# Patient Record
Sex: Male | Born: 1935 | Race: White | Hispanic: No | Marital: Married | Smoking: Former smoker
Health system: Southern US, Community
[De-identification: ages and names within clinical notes are randomized; demographics above are authoritative.]

## PROBLEM LIST (undated history)

## (undated) DIAGNOSIS — J9610 Chronic respiratory failure, unspecified whether with hypoxia or hypercapnia: Secondary | ICD-10-CM

## (undated) DIAGNOSIS — I447 Left bundle-branch block, unspecified: Secondary | ICD-10-CM

## (undated) DIAGNOSIS — J309 Allergic rhinitis, unspecified: Secondary | ICD-10-CM

## (undated) DIAGNOSIS — Z95828 Presence of other vascular implants and grafts: Secondary | ICD-10-CM

## (undated) DIAGNOSIS — J449 Chronic obstructive pulmonary disease, unspecified: Secondary | ICD-10-CM

## (undated) DIAGNOSIS — I5022 Chronic systolic (congestive) heart failure: Secondary | ICD-10-CM

## (undated) DIAGNOSIS — B029 Zoster without complications: Secondary | ICD-10-CM

## (undated) DIAGNOSIS — F419 Anxiety disorder, unspecified: Secondary | ICD-10-CM

## (undated) DIAGNOSIS — Z8719 Personal history of other diseases of the digestive system: Secondary | ICD-10-CM

## (undated) DIAGNOSIS — Z9981 Dependence on supplemental oxygen: Secondary | ICD-10-CM

## (undated) DIAGNOSIS — K819 Cholecystitis, unspecified: Secondary | ICD-10-CM

## (undated) DIAGNOSIS — N529 Male erectile dysfunction, unspecified: Secondary | ICD-10-CM

## (undated) DIAGNOSIS — J849 Interstitial pulmonary disease, unspecified: Secondary | ICD-10-CM

## (undated) DIAGNOSIS — I639 Cerebral infarction, unspecified: Secondary | ICD-10-CM

## (undated) DIAGNOSIS — E785 Hyperlipidemia, unspecified: Secondary | ICD-10-CM

## (undated) DIAGNOSIS — I739 Peripheral vascular disease, unspecified: Secondary | ICD-10-CM

## (undated) DIAGNOSIS — E669 Obesity, unspecified: Secondary | ICD-10-CM

## (undated) DIAGNOSIS — I429 Cardiomyopathy, unspecified: Secondary | ICD-10-CM

## (undated) DIAGNOSIS — I10 Essential (primary) hypertension: Secondary | ICD-10-CM

## (undated) DIAGNOSIS — C44310 Basal cell carcinoma of skin of unspecified parts of face: Secondary | ICD-10-CM

## (undated) HISTORY — DX: Hyperlipidemia, unspecified: E78.5

## (undated) HISTORY — DX: Interstitial pulmonary disease, unspecified: J84.9

## (undated) HISTORY — DX: Peripheral vascular disease, unspecified: I73.9

## (undated) HISTORY — DX: Cholecystitis, unspecified: K81.9

## (undated) HISTORY — DX: Chronic respiratory failure, unspecified whether with hypoxia or hypercapnia: J96.10

## (undated) HISTORY — DX: Allergic rhinitis, unspecified: J30.9

## (undated) HISTORY — DX: Chronic systolic (congestive) heart failure: I50.22

## (undated) HISTORY — DX: Obesity, unspecified: E66.9

## (undated) HISTORY — PX: URETHRAL STRICTURE DILATATION: SHX477

## (undated) HISTORY — DX: Male erectile dysfunction, unspecified: N52.9

## (undated) HISTORY — DX: Cerebral infarction, unspecified: I63.9

## (undated) HISTORY — DX: Essential (primary) hypertension: I10

## (undated) HISTORY — DX: Presence of other vascular implants and grafts: Z95.828

## (undated) HISTORY — DX: Left bundle-branch block, unspecified: I44.7

## (undated) HISTORY — DX: Chronic obstructive pulmonary disease, unspecified: J44.9

## (undated) HISTORY — DX: Cardiomyopathy, unspecified: I42.9

## (undated) HISTORY — DX: Basal cell carcinoma of skin of unspecified parts of face: C44.310

---

## 1977-08-22 HISTORY — PX: HERNIA REPAIR: SHX51

## 2000-07-22 ENCOUNTER — Emergency Department (HOSPITAL_COMMUNITY): Admission: EM | Admit: 2000-07-22 | Discharge: 2000-07-23 | Payer: Self-pay | Admitting: Emergency Medicine

## 2000-07-23 ENCOUNTER — Encounter: Payer: Self-pay | Admitting: Emergency Medicine

## 2000-08-22 DIAGNOSIS — Z95828 Presence of other vascular implants and grafts: Secondary | ICD-10-CM

## 2000-08-22 HISTORY — DX: Presence of other vascular implants and grafts: Z95.828

## 2000-08-22 HISTORY — PX: AORTA SURGERY: SHX548

## 2000-10-17 ENCOUNTER — Ambulatory Visit (HOSPITAL_BASED_OUTPATIENT_CLINIC_OR_DEPARTMENT_OTHER): Admission: RE | Admit: 2000-10-17 | Discharge: 2000-10-17 | Payer: Self-pay | Admitting: General Surgery

## 2001-01-15 ENCOUNTER — Encounter: Payer: Self-pay | Admitting: Vascular Surgery

## 2001-01-17 ENCOUNTER — Ambulatory Visit (HOSPITAL_COMMUNITY): Admission: RE | Admit: 2001-01-17 | Discharge: 2001-01-17 | Payer: Self-pay | Admitting: Vascular Surgery

## 2001-01-24 ENCOUNTER — Inpatient Hospital Stay (HOSPITAL_COMMUNITY): Admission: RE | Admit: 2001-01-24 | Discharge: 2001-01-29 | Payer: Self-pay | Admitting: Vascular Surgery

## 2001-01-24 ENCOUNTER — Encounter: Payer: Self-pay | Admitting: Vascular Surgery

## 2001-01-25 ENCOUNTER — Encounter: Payer: Self-pay | Admitting: Vascular Surgery

## 2002-11-26 ENCOUNTER — Ambulatory Visit (HOSPITAL_COMMUNITY): Admission: RE | Admit: 2002-11-26 | Discharge: 2002-11-26 | Payer: Self-pay | Admitting: Gastroenterology

## 2003-05-19 ENCOUNTER — Encounter: Payer: Self-pay | Admitting: Internal Medicine

## 2003-05-19 ENCOUNTER — Encounter: Admission: RE | Admit: 2003-05-19 | Discharge: 2003-05-19 | Payer: Self-pay | Admitting: Internal Medicine

## 2005-03-07 ENCOUNTER — Ambulatory Visit (HOSPITAL_COMMUNITY): Admission: RE | Admit: 2005-03-07 | Discharge: 2005-03-07 | Payer: Self-pay | Admitting: Gastroenterology

## 2012-01-20 ENCOUNTER — Other Ambulatory Visit: Payer: Self-pay | Admitting: Dermatology

## 2012-07-12 ENCOUNTER — Telehealth: Payer: Self-pay | Admitting: Cardiology

## 2012-07-12 NOTE — Telephone Encounter (Signed)
New Problem:    Malachi Bonds from [facility] called in because Dr. Waynard Edwards wants the patient to come in and be seen by Dr. [name] for Dyspnea on exertion multiple cardiac risk factors.  Patient claims to have been seen by Dr. [name] 8 years ago.  Please call back, patient does not desire a Tuesday or Wednesday.

## 2012-07-13 NOTE — Telephone Encounter (Signed)
Left message to call back  

## 2012-07-16 NOTE — Telephone Encounter (Signed)
New Problem: ° ° ° °Patient returned your call.  Please call back. °

## 2012-07-16 NOTE — Telephone Encounter (Signed)
F/u   Patient returning nurse call, he can be reached at [phone] or [phone]

## 2012-07-16 NOTE — Telephone Encounter (Signed)
Scheduled appointment for patient. Denies any cardiac history or chest pain. Only symptoms are dyspnea for about 4-5 months

## 2012-08-03 ENCOUNTER — Ambulatory Visit (INDEPENDENT_AMBULATORY_CARE_PROVIDER_SITE_OTHER): Payer: Medicare Other | Admitting: Cardiology

## 2012-08-03 VITALS — BP 149/77 | HR 80 | Resp 18 | Wt 196.0 lb

## 2012-08-03 DIAGNOSIS — Z9889 Other specified postprocedural states: Secondary | ICD-10-CM | POA: Insufficient documentation

## 2012-08-03 DIAGNOSIS — R0609 Other forms of dyspnea: Secondary | ICD-10-CM

## 2012-08-03 DIAGNOSIS — I119 Hypertensive heart disease without heart failure: Secondary | ICD-10-CM

## 2012-08-03 DIAGNOSIS — E7800 Pure hypercholesterolemia, unspecified: Secondary | ICD-10-CM

## 2012-08-03 DIAGNOSIS — K439 Ventral hernia without obstruction or gangrene: Secondary | ICD-10-CM
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
## 2012-08-03 NOTE — Patient Instructions (Signed)
Your physician has requested that you have an echocardiogram. Echocardiography is a painless test that uses sound waves to create images of your heart. It provides your doctor with information about the size and shape of your heart and how well your heart's chambers and valves are working. This procedure takes approximately one hour. There are no restrictions for this procedure.  Your physician has requested that you have an adenosine myoview. For further information please visit [email or web address] Please follow instruction sheet, as given.  WILL HAVE YOU GO FOR A CHEST XRAY TO [place] [facility] FROM [facility]

## 2012-08-06 ENCOUNTER — Ambulatory Visit (INDEPENDENT_AMBULATORY_CARE_PROVIDER_SITE_OTHER)
Admission: RE | Admit: 2012-08-06 | Discharge: 2012-08-06 | Disposition: A | Discharge status: Home / Self Care | Payer: Medicare Other | Source: Ambulatory Visit | Attending: Cardiology | Admitting: Cardiology

## 2012-08-06 DIAGNOSIS — R0609 Other forms of dyspnea: Secondary | ICD-10-CM

## 2012-08-07 ENCOUNTER — Ambulatory Visit (HOSPITAL_COMMUNITY): Payer: Medicare Other | Attending: Cardiovascular Disease | Admitting: Radiology

## 2012-08-07 VITALS — BP 122/69 | HR 82 | Ht 66.0 in | Wt 194.0 lb

## 2012-08-07 DIAGNOSIS — I10 Essential (primary) hypertension: Secondary | ICD-10-CM | POA: Insufficient documentation

## 2012-08-07 DIAGNOSIS — R0989 Other specified symptoms and signs involving the circulatory and respiratory systems: Secondary | ICD-10-CM | POA: Insufficient documentation

## 2012-08-07 DIAGNOSIS — I447 Left bundle-branch block, unspecified: Secondary | ICD-10-CM

## 2012-08-07 DIAGNOSIS — R9431 Abnormal electrocardiogram [ECG] [EKG]: Secondary | ICD-10-CM

## 2012-08-07 DIAGNOSIS — E7800 Pure hypercholesterolemia, unspecified: Secondary | ICD-10-CM

## 2012-08-07 DIAGNOSIS — I119 Hypertensive heart disease without heart failure: Secondary | ICD-10-CM

## 2012-08-07 DIAGNOSIS — R0609 Other forms of dyspnea: Secondary | ICD-10-CM | POA: Insufficient documentation

## 2012-08-07 MED ORDER — ADENOSINE (DIAGNOSTIC) 3 MG/ML IV SOLN
0.5600 mg/kg | Freq: Once | INTRAVENOUS | Status: AC
  Administered 2012-08-07: 49.2 mg via INTRAVENOUS

## 2012-08-07 MED ORDER — TECHNETIUM TC 99M SESTAMIBI GENERIC - CARDIOLITE
11.0000 | Freq: Once | INTRAVENOUS | Status: AC | PRN
  Administered 2012-08-07: 11 via INTRAVENOUS

## 2012-08-07 MED ORDER — TECHNETIUM TC 99M SESTAMIBI GENERIC - CARDIOLITE
33.0000 | Freq: Once | INTRAVENOUS | Status: AC | PRN
  Administered 2012-08-07: 33 via INTRAVENOUS

## 2012-08-07 NOTE — Progress Notes (Deleted)
[facility] SITE 3 NUCLEAR MED [address] [place], [place] [address] [phone]    Cardiology Nuclear Med Study  Lee [name] is a 78 y.o. male     MRN : [id number]     DOB: 05-06-1931  Procedure Date: 08/07/2012  Nuclear Med Background Indication for Stress Test:  {CHL INDICATION STRESS TEST:21021011} History:  {CHL HISTORY STRESS TEST:22964} Cardiac Risk Factors: {CHL CARDIAC RISK FACTORS STRESS TEST:21021014}  Symptoms:  {CHL SYMPTOMS STRESS TEST:21021013}   Nuclear Pre-Procedure Caffeine/Decaff Intake:  {CHL CAFFEINE/DECAFF INTAKE NUC:21021108} NPO After: {CHL AMB NPO TIMES:21021015}   Lungs:  {Exam; lungs brief:12271} O2 Sat: ***% on {Exam; oxygen delivery:30093}. IV 0.9% NS with Angio Cath:  {CHL IV 0.9% WITH ANGIO CATH NUC:21021042}  IV Site: {CHL IV SITE STRESS TEST:21021077}  IV Started by:  {CHL LB STRESS TEST IV STARTED HQ:46962952}  Chest Size (in):  *** Cup Size: {NA AND WUXLKGMW:10272}  Height: 5\' 6"  (1.676 m)  Weight:  194 lb (87.998 kg)  BMI:  Body mass index is 31.31 kg/(m^2). Tech Comments:  ***    Nuclear Med Study 1 or 2 day study: {CHL 1 OR 2 DAY STUDY:21021019}  Stress Test Type:  {CHL STRESS TEST TYPE:21021018}  Reading MD: {CHL LB NUC READING ZD:66440347}  Order Authorizing Provider:  ***  Resting Radionuclide: {CHL RESTING RADIONUCLIDE:21021021}  Resting Radionuclide Dose: *** mCi   Stress Radionuclide:  {CHL STRESS RADIONUCLIDE:21021022}  Stress Radionuclide Dose: *** mCi           Stress Protocol Rest HR: *** Stress HR: ***  Rest BP: *** Stress BP: ***  Exercise Time (min): {NA AND WILDCARD:21589} METS: {NA AND WILDCARD:21589}           Dose of Adenosine (mg):  {NA AND QQVZDGLO:75643} Dose of Lexiscan: {CHL CARD WILDCARD AND 0.4:21590} mg  Dose of Atropine (mg): {NA AND PIRJJOAC:16606} Dose of Dobutamine: {NA AND WILDCARD:21589} mcg/kg/min (at max HR)  Stress Test Technologist: {CHL LB STRESS TEST TECHNOLOGIST:21021024}   Nuclear Technologist:  {CHL LB NUCLEAR TECHNOLOGIST:21021025}     Rest Procedure:  {CHL REST PROCEDURE NUCLEAR:21021027} Rest ECG: {CHL REST TKZ:60109}  Stress Procedure:  {CHL STRESS PROCEDURE NUCLEAR:21021028} Stress ECG: {CHL CAR STRESS ECG:21561}  QPS Raw Data Images:  {CHL RAW DATA IMAGES NUC:21021029} Stress Images:  {CHL STRESS IMAGES NUC:21021030} Rest Images:  {CHL REST IMAGES NUC:21021031} Subtraction (SDS):  {CHL SUBTRACTION (SDS) NUC:21021032} Transient Ischemic Dilatation (Normal <1.22):  *** Lung/Heart Ratio (Normal <0.45):  ***  Quantitative Gated Spect Images QGS EDV:  *** ml QGS ESV:  *** ml  Impression Exercise Capacity:  {CHL EXERCISE CAPACITY NUC:21021037} BP Response:  {CHL BP RESPONSE NUC:21021038} Clinical Symptoms:  {CHL CLINICAL SYMPTOMS NUC:21021039} ECG Impression:  {CHL ECG IMPRESSION NUC:21021040} Comparison with Prior Nuclear Study: {CHL NUCLEAR STUDY COMPARISON:21562}  Overall Impression:  {CHL OVERALL IMPRESSION NUC:21021041}  LV Ejection Fraction: {CHL CARD STUDY NOT GATED:21592:o}.  LV Wall Motion:  {CHL CARD QGS:21591:o}

## 2012-08-07 NOTE — Progress Notes (Signed)
[facility] SITE 3 NUCLEAR MED [address] [place], [place] [address] [phone]    Cardiology Nuclear Med Study  Lee [name] is a 78 y.o. male     MRN : [id number]     DOB: [birth year]  Procedure Date: 08/07/2012  Nuclear Med Background Indication for Stress Test:  Evaluation for Ischemia and Abnormal EKG:New LBBB History:  '01 MPS:OK per patient; '02 AAA Repair Cardiac Risk Factors: History of Smoking, Hypertension, LBBB, Lipids and Obesity  Symptoms:  DOE and Fatigue   Nuclear Pre-Procedure Caffeine/Decaff Intake:  None NPO After: 7:00pm   Lungs:  Clear. O2 Sat: 94% on room air. IV 0.9% NS with Angio Cath:  22g  IV Site: R Hand  IV Started by:  Milana Na, EMT-P  Chest Size (in):  42 Cup Size: n/a  Height: 5\' 6"  (1.676 m)  Weight:  194 lb (87.998 kg)  BMI:  Body mass index is 31.31 kg/(m^2). Tech Comments:  n/a    Nuclear Med Study 1 or 2 day study: 1 day  Stress Test Type:  Adenosine  Reading MD: Kristeen Miss, MD  Order Authorizing Provider:  Villa Herb  Resting Radionuclide: Technetium 55m Sestamibi  Resting Radionuclide Dose: 11.0 mCi   Stress Radionuclide:  Technetium 67m Sestamibi  Stress Radionuclide Dose: 33.0 mCi           Stress Protocol Rest HR: 82 Stress HR: 90  Rest BP: 122/69 Stress BP: 126/57  Exercise Time (min): n/a METS: n/a   Predicted Max HR: 141 bpm % Max HR: 63.83 bpm Rate Pressure Product: 21308    Dose of Adenosine (mg):  49.4 Dose of Lexiscan: n/a mg  Dose of Atropine (mg): n/a Dose of Dobutamine: n/a mcg/kg/min (at max HR)  Stress Test Technologist: Smiley Houseman, CMA-N  Nuclear Technologist:  Domenic Polite, CNMT     Rest Procedure:  Myocardial perfusion imaging was performed at rest 45 minutes following the intravenous administration of Technetium 60m Sestamibi.  Rest ECG: NSR-LBBB  Stress Procedure:  The patient received IV adenosine at 140 mcg/kg/min for 4 minutes.  Technetium 64m Sestamibi was  injected at the 2 minute mark and quantitative spect images were obtained after a 45 minute delay.  He c/o chest tightness with infusion.  Stress ECG: No significant change from baseline ECG  QPS Raw Data Images:  Mild diaphragmatic attenuation.  Normal left ventricular size. Stress Images:  Thre is mild-moderate attenuation of the entire inferior wall with normal uptake in the other regions.   Rest Images:  Thre is mild-moderate attenuation of the entire inferior wall with normal uptake in the other regions. Subtraction (SDS):  No evidence of ischemia.  SDS = 1. Transient Ischemic Dilatation (Normal <1.22):  1.10 Lung/Heart Ratio (Normal <0.45):  0.33  Quantitative Gated Spect Images QGS EDV:  92 ml QGS ESV:  44 ml  Impression Exercise Capacity:  Adenosine study with no exercise. BP Response:  Normal blood pressure response. Clinical Symptoms:  No significant symptoms noted. ECG Impression:  No significant ST segment change suggestive of ischemia. Comparison with Prior Nuclear Study: No images to compare  Overall Impression:  Low risk stress nuclear study.  SDS = 1.   There is attenuation of the inferior wall.  This region contracts well which suggests that this attenuation is due to diaphragmatic attenuation.  I cannot rule out inferior ischemia.   LV Ejection Fraction: 52%.  LV Wall Motion:  NL LV Function; NL Wall Motion.  Vesta Mixer, Montez Hageman., MD, [facility] 08/07/2012, 4:55 PM Office - [phone] Pager [phone]

## 2012-08-08 ENCOUNTER — Telehealth: Payer: Self-pay | Admitting: *Deleted

## 2012-08-08 ENCOUNTER — Other Ambulatory Visit (HOSPITAL_COMMUNITY): Payer: Medicare Other

## 2012-08-08 NOTE — Telephone Encounter (Signed)
Advised of chest xray and nuclear test

## 2012-08-08 NOTE — Telephone Encounter (Signed)
Message copied by Burnell Blanks on Wed Aug 08, 2012  5:46 PM ------      Message from: Cassell Clement      Created: Mon Aug 06, 2012  8:18 PM       Chest xray okay.  Heart size normal.

## 2012-08-08 NOTE — Telephone Encounter (Signed)
Message copied by Burnell Blanks on Wed Aug 08, 2012  5:46 PM ------      Message from: Cassell Clement      Created: Wed Aug 08, 2012  1:20 PM       Please report.  The nuclear stress test is normal and shows normal ejection fraction and no ischemia.

## 2012-08-09 ENCOUNTER — Ambulatory Visit (HOSPITAL_COMMUNITY): Payer: Medicare Other | Attending: Cardiology

## 2012-08-09 DIAGNOSIS — R0602 Shortness of breath: Secondary | ICD-10-CM

## 2012-08-09 DIAGNOSIS — E785 Hyperlipidemia, unspecified: Secondary | ICD-10-CM | POA: Insufficient documentation

## 2012-08-09 DIAGNOSIS — I447 Left bundle-branch block, unspecified: Secondary | ICD-10-CM | POA: Insufficient documentation

## 2012-08-09 DIAGNOSIS — R0609 Other forms of dyspnea: Secondary | ICD-10-CM | POA: Insufficient documentation

## 2012-08-09 DIAGNOSIS — R0989 Other specified symptoms and signs involving the circulatory and respiratory systems: Secondary | ICD-10-CM | POA: Insufficient documentation

## 2012-08-09 DIAGNOSIS — Z87891 Personal history of nicotine dependence: Secondary | ICD-10-CM | POA: Insufficient documentation

## 2012-08-09 NOTE — Progress Notes (Signed)
Echocardiogram performed.  

## 2012-08-13 ENCOUNTER — Telehealth: Payer: Self-pay | Admitting: *Deleted

## 2012-08-13 NOTE — Telephone Encounter (Signed)
Advised of echo Will send to Dr Waynard Edwards  Advised patient if doesn't hear from Perini's office to call back (possible referral to pulmonary)

## 2012-08-13 NOTE — Telephone Encounter (Signed)
Message copied by Burnell Blanks on Mon Aug 13, 2012  6:10 PM ------      Message from: Cassell Clement      Created: Thu Aug 09, 2012 12:41 PM       Please report.  The echocardiogram is satisfactory.  The left ventricular function is normal.  There are no significant valve abnormalities.  The pulmonary artery pressure is slightly elevated.  This suggests that his dyspnea may be from a pulmonary etiology.      Send a copy to Dr. Waynard Edwards

## 2012-09-12 ENCOUNTER — Encounter: Payer: Self-pay | Admitting: *Deleted

## 2012-09-13 ENCOUNTER — Encounter: Payer: Self-pay | Admitting: Internal Medicine

## 2012-09-13 ENCOUNTER — Ambulatory Visit (INDEPENDENT_AMBULATORY_CARE_PROVIDER_SITE_OTHER): Payer: Medicare Other | Admitting: Internal Medicine

## 2012-09-13 VITALS — BP 112/70 | HR 100 | Temp 97.6°F | Ht 66.0 in | Wt 197.8 lb

## 2012-09-13 DIAGNOSIS — R0600 Dyspnea, unspecified: Secondary | ICD-10-CM

## 2012-09-13 DIAGNOSIS — R0989 Other specified symptoms and signs involving the circulatory and respiratory systems: Secondary | ICD-10-CM

## 2012-09-13 DIAGNOSIS — J8410 Pulmonary fibrosis, unspecified: Secondary | ICD-10-CM

## 2012-09-13 DIAGNOSIS — J849 Interstitial pulmonary disease, unspecified: Secondary | ICD-10-CM

## 2012-09-13 NOTE — Patient Instructions (Addendum)
Please have walk test by our CMA, full pft breathing test and CT chest without contrast I will call you with results to decide next steps

## 2012-09-13 NOTE — Assessment & Plan Note (Signed)
Clinical features on chest x-ray and exam with crackles in history of dyspnea makes it suspicious for interstitial lung disease not otherwise specified. In order to better get definition of the disease will get pulmonary function tests and CT scan of the chest interstitial lung disease protocol. If there indeed is interstitial lung disease we'll proceed with autoimmune workup before I call him in for discussion. Alternative etiologies for dyspnea include deconditioning, obesity and COPD. I've explained all this to him and his wife and they have verbalized understanding and wished to proceed. I will call him with results and keep them engaged over the phone before we decide on followup date
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
## 2012-09-17 ENCOUNTER — Ambulatory Visit (INDEPENDENT_AMBULATORY_CARE_PROVIDER_SITE_OTHER)
Admission: RE | Admit: 2012-09-17 | Discharge: 2012-09-17 | Disposition: A | Discharge status: Home / Self Care | Payer: Medicare Other | Source: Ambulatory Visit | Attending: Internal Medicine | Admitting: Internal Medicine

## 2012-09-17 DIAGNOSIS — R0609 Other forms of dyspnea: Secondary | ICD-10-CM

## 2012-09-17 DIAGNOSIS — R0989 Other specified symptoms and signs involving the circulatory and respiratory systems: Secondary | ICD-10-CM

## 2012-09-17 DIAGNOSIS — J849 Interstitial pulmonary disease, unspecified: Secondary | ICD-10-CM

## 2012-09-17 DIAGNOSIS — J8410 Pulmonary fibrosis, unspecified: Secondary | ICD-10-CM

## 2012-09-17 DIAGNOSIS — R0600 Dyspnea, unspecified: Secondary | ICD-10-CM

## 2012-09-19 ENCOUNTER — Telehealth: Payer: Self-pay | Admitting: Internal Medicine

## 2012-09-19 DIAGNOSIS — J439 Emphysema, unspecified: Secondary | ICD-10-CM

## 2012-09-19 DIAGNOSIS — J849 Interstitial pulmonary disease, unspecified: Secondary | ICD-10-CM

## 2012-09-19 NOTE — Telephone Encounter (Signed)
CT scan chest reviewed below shows both emphysema and pulmonary fibrosis. Please tell him to have blood test and then based on results we will call him for followup  Dr. Kalman Shan, M.D., [facility].C.P Pulmonary and Critical Care Medicine Staff Physician [place] [facility] [place] [facility] Pager: [phone], If no answer or between  15:00h - 7:00h: call [phone]  09/19/2012 12:54 PM               Ct Chest Wo Contrast  09/17/2012  *RADIOLOGY REPORT*  Clinical Data: Dyspnea.  Crackles.  Evaluate for interstitial lung disease.  CT CHEST WITHOUT CONTRAST  Technique:  Multidetector CT imaging of the chest was performed following the standard protocol without IV contrast.  Comparison: No priors.  Findings:  Mediastinum: Heart size is normal. There is no significant pericardial fluid, thickening or pericardial calcification. There is atherosclerosis of the thoracic aorta, the great vessels of the mediastinum and the coronary arteries, including calcified atherosclerotic plaque in the left main and right coronary arteries. No pathologically enlarged mediastinal or hilar lymph nodes. Please note that accurate exclusion of hilar adenopathy is limited on noncontrast CT scans.  There is a small hiatal hernia.  Lungs/Pleura: There is mild diffuse bronchial wall thickening with moderate centrilobular and paraseptal emphysema, most pronounced in the lung apices bilaterally (right greater than left) where there are multiple bulla.  In the periphery of the lungs there are some patchy areas of mild ground-glass attenuation with some mild subpleural reticulation.  No frank honeycombing is identified. There does appear to be a small amount of peripheral bronchiolectasis.  No frank traction bronchiectasis.  Inspiratory and expiratory imaging is remarkable for areas of air trapping in the lower lobes of the lungs bilaterally, suggesting small airway disease.  There are some patchy areas  with some mild peribronchovascular micronodularity, many of which are calcified, particularly in the periphery of the right lower lobe anteriorly. There are a few other calcified granulomas as well.  No acute consolidative airspace disease.  No pleural effusions.  Upper Abdomen: In segments 2 and 3 of the liver there is a 4.5 cm low attenuation lesion which is incompletely characterized on this noncontrast CT examination, but is favored to represent a large cyst.  Musculoskeletal: There are no aggressive appearing lytic or blastic lesions noted in the visualized portions of the skeleton.  IMPRESSION: 1.  Diffuse bronchial wall thickening with moderate centrilobular and paraseptal emphysema, as above.  In addition, there are areas of air trapping related to small airways disease.  Findings are compatible with underlying COPD. 2.  In addition, there are some patchy areas of very mild peripheral ground glass attenuation, subpleural reticulation and peripheral bronchiolectasis, which could suggest a superimposed interstitial lung disease.  These findings would favor mild nonspecific interstitial pneumonia (NSIP), and are not suggestive at this time of usual interstitial pneumonia. 3. Atherosclerosis, including left main and RCA coronary artery disease.    Assessment for potential risk factor modification, dietary therapy or pharmacologic therapy may be warranted, if clinically indicated. 4. 4.5 cm low attenuation lesion in the left lobe of the liver is incompletely characterized, but favored to represent a large cyst. This could be confirmed with ultrasound examination if clinically indicated. 5.  Additional incidental findings, as above.   Original Report Authenticated By: Trudie Reed, M.D.

## 2012-09-19 NOTE — Telephone Encounter (Signed)
Pt advised. Milly Goggins, CMA  

## 2012-09-20 ENCOUNTER — Other Ambulatory Visit (INDEPENDENT_AMBULATORY_CARE_PROVIDER_SITE_OTHER): Payer: Medicare Other

## 2012-09-20 DIAGNOSIS — J849 Interstitial pulmonary disease, unspecified: Secondary | ICD-10-CM

## 2012-09-20 DIAGNOSIS — J8410 Pulmonary fibrosis, unspecified: Secondary | ICD-10-CM

## 2012-09-20 DIAGNOSIS — J439 Emphysema, unspecified: Secondary | ICD-10-CM

## 2012-09-20 DIAGNOSIS — J438 Other emphysema: Secondary | ICD-10-CM

## 2012-09-20 LAB — CK: Total CK: 145 U/L (ref 7–232)

## 2012-09-21 LAB — SJOGRENS SYNDROME-B EXTRACTABLE NUCLEAR ANTIBODY: SSB (La) (ENA) Antibody, IgG: 1 AU/mL (ref <30)

## 2012-09-21 LAB — ANCA SCREEN W REFLEX TITER: p-ANCA Screen: NEGATIVE

## 2012-09-21 LAB — ANTI-SCLERODERMA ANTIBODY: Scleroderma (Scl-70) (ENA) Antibody, IgG: 1 AU/mL (ref <30)

## 2012-09-26 ENCOUNTER — Telehealth: Payer: Self-pay | Admitting: Internal Medicine

## 2012-09-26 NOTE — Telephone Encounter (Signed)
Blood test all normal. He is to come and see me after pft 09/28/12

## 2012-09-28 ENCOUNTER — Ambulatory Visit (INDEPENDENT_AMBULATORY_CARE_PROVIDER_SITE_OTHER): Payer: Medicare Other | Admitting: Internal Medicine

## 2012-09-28 DIAGNOSIS — R0609 Other forms of dyspnea: Secondary | ICD-10-CM

## 2012-09-28 DIAGNOSIS — R0989 Other specified symptoms and signs involving the circulatory and respiratory systems: Secondary | ICD-10-CM

## 2012-09-28 DIAGNOSIS — R0600 Dyspnea, unspecified: Secondary | ICD-10-CM

## 2012-09-28 LAB — ALPHA-1 ANTITRYPSIN PHENOTYPE: A-1 Antitrypsin: 160 mg/dL (ref 83–199)

## 2012-09-28 LAB — PULMONARY FUNCTION TEST

## 2012-09-28 LAB — HYPERSENSITIVITY PNUEMONITIS PROFILE

## 2012-09-28 NOTE — Progress Notes (Signed)
PFT done today. 

## 2012-10-01 ENCOUNTER — Telehealth: Payer: Self-pay | Admitting: Internal Medicine

## 2012-10-01 NOTE — Telephone Encounter (Signed)
Dup msg

## 2012-10-01 NOTE — Telephone Encounter (Signed)
[email or web address] Castillo, CMA  

## 2012-10-01 NOTE — Telephone Encounter (Signed)
Dup msg- see phone note 09/26/12

## 2012-10-01 NOTE — Telephone Encounter (Signed)
Spoke with pt and notified of recs per MR and scheduled appt with MR for 10/23/12 at 3:15 pm Nothing further needed

## 2012-10-23 ENCOUNTER — Encounter: Payer: Self-pay | Admitting: Internal Medicine

## 2012-10-23 ENCOUNTER — Ambulatory Visit (INDEPENDENT_AMBULATORY_CARE_PROVIDER_SITE_OTHER): Payer: Medicare Other | Admitting: Internal Medicine

## 2012-10-23 ENCOUNTER — Other Ambulatory Visit: Payer: Medicare Other

## 2012-10-23 VITALS — BP 120/80 | HR 97 | Temp 98.0°F | Ht 66.0 in | Wt 197.0 lb

## 2012-10-23 DIAGNOSIS — R0600 Dyspnea, unspecified: Secondary | ICD-10-CM

## 2012-10-23 DIAGNOSIS — J8410 Pulmonary fibrosis, unspecified: Secondary | ICD-10-CM

## 2012-10-23 DIAGNOSIS — J438 Other emphysema: Secondary | ICD-10-CM

## 2012-10-23 NOTE — Assessment & Plan Note (Signed)
You have a combination combination of emphysema and pulmonary fibrosis Together the severity of the disease is on the milder side of moderate  #For emphysema - Please start symbicort 80/4.5 2 puff twice daily - take sample, script and show technique - Do overnight oximetry study; will call with results - Start pulmonary rehabilitation at [place] - Continue to attend [employer] exercises to start pulmonary rehabilitation - Do alpha 1 test genetic cause of COPD/ emphysema  #For pulmonary fibrosis - Etiology or cause of this is unknown - Acid reflux could be playing a part - Take over-the-counter Prilosec 20 mg once a day before breakfast on an empty stomach - Take N. acetylcysteine 600 mg 3 times a day; you can get this is a vitamin store or at [facility] or [email or web address] - You might need a lung biopsy but we can discuss this further down the line  #Followup - 6 weeks to report progress   (> 50% of this 15 min visit spent in face to face counseling)

## 2012-10-23 NOTE — Patient Instructions (Addendum)
You have a combination combination of emphysema and pulmonary fibrosis Together the severity of the disease is on the milder side of moderate  #For emphysema - Please start symbicort 80/4.5 2 puff twice daily - take sample, script and show technique - Do overnight oximetry study; will call with results - Start pulmonary rehabilitation at [place] - Continue to attend [employer] exercises to start pulmonary rehabilitation - Do alpha 1 test genetic cause of COPD/ emphysema  #For pulmonary fibrosis - Etiology or cause of this is unknown - Acid reflux could be playing a part - Take over-the-counter Prilosec 20 mg once a day before breakfast on an empty stomach - Take N. acetylcysteine 600 mg 3 times a day; you can get this is a vitamin store or at [facility] or [email or web address] - You might need a lung biopsy but we can discuss this further down the line  #Followup - 6 weeks to report progress
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
## 2012-11-01 LAB — ALPHA-1 ANTITRYPSIN PHENOTYPE: A-1 Antitrypsin: 153 mg/dL (ref 83–199)

## 2012-11-04 ENCOUNTER — Telehealth: Payer: Self-pay | Admitting: Internal Medicine

## 2012-11-04 NOTE — Telephone Encounter (Signed)
ono 10/25/12 shows pulse ox < 88% for 28 min, 20 sec which is 7% of the sleep time. He needso2 at night 2L [place]. Order done. Please ensure through [facility]   Dr. Kalman Shan, M.D., [facility].C.P Pulmonary and Critical Care Medicine Staff Physician [place] [facility] [place] [facility] Pager: [phone], If no answer or between  15:00h - 7:00h: call [phone]  11/04/2012 11:28 PM

## 2012-11-05 ENCOUNTER — Encounter: Payer: Self-pay | Admitting: Internal Medicine

## 2012-11-05 ENCOUNTER — Telehealth: Payer: Self-pay | Admitting: Internal Medicine

## 2012-11-05 NOTE — Telephone Encounter (Signed)
Spoke with pt's wife and verified that pt is to start Oxygen 2L at night due to recent results of ONO.  She verbalized understanding.

## 2012-11-07 NOTE — Telephone Encounter (Signed)
Abigail Miyamoto, RN at 11/05/2012 4:11 PM   Status: Signed            Spoke with pt's wife and verified that pt is to start Oxygen 2L at night due to recent results of ONO. She verbalized understanding.

## 2012-11-14 ENCOUNTER — Telehealth: Payer: Self-pay | Admitting: Internal Medicine

## 2012-11-14 NOTE — Telephone Encounter (Signed)
ono 10/25/12 shows 28 min and 20 sec < 88%. HE qualifies for 2L [place] o2 at night. If he is ok with it, please set it up  Thanks  Dr. Kalman Shan, M.D., [facility].C.P Pulmonary and Critical Care Medicine Staff Physician [place] [facility] [place] [facility] Pager: [phone], If no answer or between  15:00h - 7:00h: call [phone]  11/14/2012 11:35 PM

## 2012-11-15 ENCOUNTER — Encounter: Payer: Self-pay | Admitting: Internal Medicine

## 2012-11-15 NOTE — Telephone Encounter (Signed)
Pt was already notified of this back on 11-05-12 and is using oxygen. [name], CMA

## 2012-12-10 ENCOUNTER — Encounter: Payer: Self-pay | Admitting: Internal Medicine

## 2012-12-10 ENCOUNTER — Ambulatory Visit (INDEPENDENT_AMBULATORY_CARE_PROVIDER_SITE_OTHER): Payer: Medicare Other | Admitting: Internal Medicine

## 2012-12-10 VITALS — BP 136/68 | HR 79 | Temp 97.5°F | Ht 66.0 in | Wt 198.4 lb

## 2012-12-10 DIAGNOSIS — J849 Interstitial pulmonary disease, unspecified: Secondary | ICD-10-CM

## 2012-12-10 DIAGNOSIS — R0609 Other forms of dyspnea: Secondary | ICD-10-CM

## 2012-12-10 DIAGNOSIS — J438 Other emphysema: Secondary | ICD-10-CM

## 2012-12-10 NOTE — Patient Instructions (Addendum)
You have a combination combination of emphysema and pulmonary fibrosis Shortness of breath is related to above and possibly fitness issues    #For emphysema - Stop Symbicor because this is not helping you - Please start spiriva 1 puff daily - take samples worth 2 months and show technique - Continue oxygen at night - Start swimming as soon as  possible - I have made another referral to pulmonary rehabilitation and hopefully we can start in the next few weeks  #For pulmonary fibrosis - Etiology or cause of this is unknown - Acid reflux could be playing a part - Continue over-the-counter Prilosec 20 mg once a day before breakfast on an empty stomach - Continue N. acetylcysteine 600 mg 3 times a day;  - You might need a lung biopsy but we can discuss this further down the line  # acid reflux - Glad this is significantly improved with Prilosec. Continue the same  #Followup - 6-8  weeks to report progress
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
## 2012-12-12 NOTE — Assessment & Plan Note (Signed)
You have a combination combination of emphysema and pulmonary fibrosis Shortness of breath is related to above and possibly fitness issues    #For emphysema - Stop Symbicor because this is not helping you - Please start spiriva 1 puff daily - take samples worth 2 months and show technique - Continue oxygen at night - Start swimming as soon as  possible - I have made another referral to pulmonary rehabilitation and hopefully we can start in the next few weeks  #For pulmonary fibrosis - Etiology or cause of this is unknown - Acid reflux could be playing a part - Continue over-the-counter Prilosec 20 mg once a day before breakfast on an empty stomach - Continue N. acetylcysteine 600 mg 3 times a day;  - You might need a lung biopsy but we can discuss this further down the line  # acid reflux - Glad this is significantly improved with Prilosec. Continue the same  #Followup - 6-8  weeks to report progress   (> 50% of this 15 min visit spent in face to face counseling)

## 2012-12-24 ENCOUNTER — Ambulatory Visit (HOSPITAL_COMMUNITY): Payer: Medicare Other

## 2012-12-26 ENCOUNTER — Encounter (HOSPITAL_COMMUNITY): Payer: Self-pay

## 2012-12-26 ENCOUNTER — Encounter (HOSPITAL_COMMUNITY)
Admission: RE | Admit: 2012-12-26 | Discharge: 2012-12-26 | Disposition: A | Discharge status: Home / Self Care | Payer: Medicare Other | Source: Ambulatory Visit | Attending: Internal Medicine | Admitting: Internal Medicine

## 2012-12-26 DIAGNOSIS — J438 Other emphysema: Secondary | ICD-10-CM | POA: Insufficient documentation

## 2012-12-26 DIAGNOSIS — Z5189 Encounter for other specified aftercare: Secondary | ICD-10-CM | POA: Insufficient documentation

## 2012-12-26 DIAGNOSIS — E785 Hyperlipidemia, unspecified: Secondary | ICD-10-CM | POA: Insufficient documentation

## 2012-12-26 DIAGNOSIS — I119 Hypertensive heart disease without heart failure: Secondary | ICD-10-CM | POA: Insufficient documentation

## 2012-12-26 DIAGNOSIS — I447 Left bundle-branch block, unspecified: Secondary | ICD-10-CM | POA: Insufficient documentation

## 2012-12-26 DIAGNOSIS — J8410 Pulmonary fibrosis, unspecified: Secondary | ICD-10-CM | POA: Insufficient documentation

## 2013-01-01 ENCOUNTER — Encounter (HOSPITAL_COMMUNITY)
Admission: RE | Admit: 2013-01-01 | Discharge: 2013-01-01 | Disposition: A | Discharge status: Home / Self Care | Payer: Medicare Other | Source: Ambulatory Visit | Attending: Internal Medicine | Admitting: Internal Medicine

## 2013-01-03 ENCOUNTER — Encounter (HOSPITAL_COMMUNITY)
Admission: RE | Admit: 2013-01-03 | Discharge: 2013-01-03 | Disposition: A | Discharge status: Home / Self Care | Payer: Medicare Other | Source: Ambulatory Visit | Attending: Internal Medicine | Admitting: Internal Medicine

## 2013-01-08 ENCOUNTER — Encounter (HOSPITAL_COMMUNITY)
Admission: RE | Admit: 2013-01-08 | Discharge: 2013-01-08 | Disposition: A | Discharge status: Home / Self Care | Payer: Medicare Other | Source: Ambulatory Visit | Attending: Internal Medicine | Admitting: Internal Medicine

## 2013-01-10 ENCOUNTER — Encounter (HOSPITAL_COMMUNITY)
Admission: RE | Admit: 2013-01-10 | Discharge: 2013-01-10 | Disposition: A | Discharge status: Home / Self Care | Payer: Medicare Other | Source: Ambulatory Visit | Attending: Internal Medicine | Admitting: Internal Medicine

## 2013-01-15 ENCOUNTER — Encounter (HOSPITAL_COMMUNITY)
Admission: RE | Admit: 2013-01-15 | Discharge: 2013-01-15 | Disposition: A | Discharge status: Home / Self Care | Payer: Medicare Other | Source: Ambulatory Visit | Attending: Internal Medicine | Admitting: Internal Medicine

## 2013-01-17 ENCOUNTER — Encounter (HOSPITAL_COMMUNITY)
Admission: RE | Admit: 2013-01-17 | Discharge: 2013-01-17 | Disposition: A | Discharge status: Home / Self Care | Payer: Medicare Other | Source: Ambulatory Visit | Attending: Internal Medicine | Admitting: Internal Medicine

## 2013-01-22 ENCOUNTER — Encounter (HOSPITAL_COMMUNITY)
Admission: RE | Admit: 2013-01-22 | Discharge: 2013-01-22 | Disposition: A | Discharge status: Home / Self Care | Payer: Medicare Other | Source: Ambulatory Visit | Attending: Internal Medicine | Admitting: Internal Medicine

## 2013-01-22 DIAGNOSIS — Z5189 Encounter for other specified aftercare: Secondary | ICD-10-CM | POA: Insufficient documentation

## 2013-01-22 DIAGNOSIS — J438 Other emphysema: Secondary | ICD-10-CM | POA: Insufficient documentation

## 2013-01-22 DIAGNOSIS — I447 Left bundle-branch block, unspecified: Secondary | ICD-10-CM | POA: Insufficient documentation

## 2013-01-22 DIAGNOSIS — E785 Hyperlipidemia, unspecified: Secondary | ICD-10-CM | POA: Insufficient documentation

## 2013-01-22 DIAGNOSIS — J8410 Pulmonary fibrosis, unspecified: Secondary | ICD-10-CM | POA: Insufficient documentation

## 2013-01-22 DIAGNOSIS — I119 Hypertensive heart disease without heart failure: Secondary | ICD-10-CM | POA: Insufficient documentation

## 2013-01-22 NOTE — Progress Notes (Signed)
[name] participated in Pulmonary Rehab today, increasing work loads, very cheerful and pleasant with everyone.  He has had some discomfort in his left knee over the week end, not bothering him today.  Advised to apply ice after exercise and to inform staff prior to exercise if there is a problem.  Work loads were increased.  Cathie Olden RN

## 2013-01-24 ENCOUNTER — Encounter (HOSPITAL_COMMUNITY)
Admission: RE | Admit: 2013-01-24 | Discharge: 2013-01-24 | Disposition: A | Discharge status: Home / Self Care | Payer: Medicare Other | Source: Ambulatory Visit | Attending: Internal Medicine | Admitting: Internal Medicine

## 2013-01-29 ENCOUNTER — Encounter (HOSPITAL_COMMUNITY)
Admission: RE | Admit: 2013-01-29 | Discharge: 2013-01-29 | Disposition: A | Discharge status: Home / Self Care | Payer: Medicare Other | Source: Ambulatory Visit | Attending: Internal Medicine | Admitting: Internal Medicine

## 2013-01-31 ENCOUNTER — Encounter (HOSPITAL_COMMUNITY)
Admission: RE | Admit: 2013-01-31 | Discharge: 2013-01-31 | Disposition: A | Discharge status: Home / Self Care | Payer: Medicare Other | Source: Ambulatory Visit | Attending: Internal Medicine | Admitting: Internal Medicine

## 2013-02-04 ENCOUNTER — Ambulatory Visit (INDEPENDENT_AMBULATORY_CARE_PROVIDER_SITE_OTHER): Payer: Medicare Other | Admitting: Internal Medicine

## 2013-02-04 ENCOUNTER — Encounter: Payer: Self-pay | Admitting: Internal Medicine

## 2013-02-04 VITALS — BP 130/70 | HR 78 | Temp 97.2°F | Ht 66.0 in | Wt 202.0 lb

## 2013-02-04 DIAGNOSIS — J438 Other emphysema: Secondary | ICD-10-CM

## 2013-02-04 DIAGNOSIS — J84112 Idiopathic pulmonary fibrosis: Secondary | ICD-10-CM

## 2013-02-04 DIAGNOSIS — K219 Gastro-esophageal reflux disease without esophagitis: Secondary | ICD-10-CM

## 2013-02-04 NOTE — Patient Instructions (Addendum)
You have a combination combination of emphysema and pulmonary fibrosis Pulmonary fibrosis likely due to disease called IPF Shortness of breath is related to above and possibly fitness issues Glad you are much better after rehab   #For emphysema - Continue piriva 1 puff daily - take samples worth 2 months and show technique - Continue oxygen at night but yes use saline nasal spray and gel to prevent bleeding nose -Continue pulmonary rehabilitation - Continue N. acetylcysteine 600 mg 3 times a day;   #For pulmonary fibrosis - Disease called IPF - Etiology or cause of this is unknown - Acid reflux could be playing a part - Continue over-the-counter Prilosec 20 mg once a day before breakfast on an empty stomach - You DO NOT need lung biopsy - We discussed drug trial called Pirfenidone but decided against it but in favor of continued observation  # acid reflux - Glad this is significantly improved with Prilosec. Continue the same  #Followup - 6 mmnths - PFT at followup
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
## 2013-02-05 ENCOUNTER — Encounter (HOSPITAL_COMMUNITY)
Admission: RE | Admit: 2013-02-05 | Discharge: 2013-02-05 | Disposition: A | Discharge status: Home / Self Care | Payer: Medicare Other | Source: Ambulatory Visit | Attending: Internal Medicine | Admitting: Internal Medicine

## 2013-02-05 DIAGNOSIS — K219 Gastro-esophageal reflux disease without esophagitis: Secondary | ICD-10-CM | POA: Insufficient documentation

## 2013-02-05 NOTE — Assessment & Plan Note (Signed)
You have a combination combination of emphysema and pulmonary fibrosis Pulmonary fibrosis likely due to disease called IPF Shortness of breath is related to above and possibly fitness issues Glad you are much better after rehab      #For pulmonary fibrosis - Disease called IPF - Etiology or cause of this is unknown - Acid reflux could be playing a part - Continue over-the-counter Prilosec 20 mg once a day before breakfast on an empty stomach - You DO NOT need lung biopsy - We discussed drug trial called Pirfenidone but decided against it but in favor of continued observation  #Followup - 6 mmnths - PFT at followup

## 2013-02-05 NOTE — Assessment & Plan Note (Signed)
#   acid reflux - Glad this is significantly improved with Prilosec. Continue the same

## 2013-02-05 NOTE — Assessment & Plan Note (Signed)
You have a combination combination of emphysema and pulmonary fibrosis Pulmonary fibrosis likely due to disease called IPF Shortness of breath is related to above and possibly fitness issues Glad you are much better after rehab   #For emphysema - Continue piriva 1 puff daily - take samples worth 2 months and show technique - Continue oxygen at night but yes use saline nasal spray and gel to prevent bleeding nose -Continue pulmonary rehabilitation - Continue N. acetylcysteine 600 mg 3 times a day;   #Followup - 6 mmnths - PFT at followup

## 2013-02-07 ENCOUNTER — Encounter (HOSPITAL_COMMUNITY)
Admission: RE | Admit: 2013-02-07 | Discharge: 2013-02-07 | Disposition: A | Discharge status: Home / Self Care | Payer: Medicare Other | Source: Ambulatory Visit | Attending: Internal Medicine | Admitting: Internal Medicine

## 2013-02-12 ENCOUNTER — Encounter (HOSPITAL_COMMUNITY)
Admission: RE | Admit: 2013-02-12 | Discharge: 2013-02-12 | Disposition: A | Discharge status: Home / Self Care | Payer: Medicare Other | Source: Ambulatory Visit | Attending: Internal Medicine | Admitting: Internal Medicine

## 2013-02-14 ENCOUNTER — Encounter (HOSPITAL_COMMUNITY)
Admission: RE | Admit: 2013-02-14 | Discharge: 2013-02-14 | Disposition: A | Discharge status: Home / Self Care | Payer: Medicare Other | Source: Ambulatory Visit | Attending: Internal Medicine | Admitting: Internal Medicine

## 2013-02-19 ENCOUNTER — Encounter (HOSPITAL_COMMUNITY): Payer: Medicare Other

## 2013-02-21 ENCOUNTER — Encounter (HOSPITAL_COMMUNITY): Payer: Medicare Other

## 2013-02-26 ENCOUNTER — Encounter (HOSPITAL_COMMUNITY)
Admission: RE | Admit: 2013-02-26 | Discharge: 2013-02-26 | Disposition: A | Discharge status: Home / Self Care | Payer: Medicare Other | Source: Ambulatory Visit | Attending: Internal Medicine | Admitting: Internal Medicine

## 2013-02-26 DIAGNOSIS — I119 Hypertensive heart disease without heart failure: Secondary | ICD-10-CM | POA: Insufficient documentation

## 2013-02-26 DIAGNOSIS — J438 Other emphysema: Secondary | ICD-10-CM | POA: Insufficient documentation

## 2013-02-26 DIAGNOSIS — I447 Left bundle-branch block, unspecified: Secondary | ICD-10-CM | POA: Insufficient documentation

## 2013-02-26 DIAGNOSIS — E785 Hyperlipidemia, unspecified: Secondary | ICD-10-CM | POA: Insufficient documentation

## 2013-02-26 DIAGNOSIS — Z5189 Encounter for other specified aftercare: Secondary | ICD-10-CM | POA: Insufficient documentation

## 2013-02-26 DIAGNOSIS — J8410 Pulmonary fibrosis, unspecified: Secondary | ICD-10-CM | POA: Insufficient documentation

## 2013-02-28 ENCOUNTER — Encounter (HOSPITAL_COMMUNITY)
Admission: RE | Admit: 2013-02-28 | Discharge: 2013-02-28 | Disposition: A | Discharge status: Home / Self Care | Payer: Medicare Other | Source: Ambulatory Visit | Attending: Internal Medicine | Admitting: Internal Medicine

## 2013-02-28 NOTE — Progress Notes (Signed)
Lee [name] 79 y.o. male Nutrition Note Spoke with pt and pt's wife. Pt is obese.  Pt eats 3 meals a day; most prepared at home. There are some ways the pt can make his diet healthier.  Pt's Rate Your Plate results reviewed with pt. Pt wants to lose wt. Per discussion, pt has been trying to eat "less." Pt does eat a sausage biscuit and nabs "once a week when I work." Age-appropriate nutrition recommendations discussed. Pt and pt's wife expressed understanding.  Pt avoids many salty food; "occasionally" uses canned/ convenience food.  Pt does not add salt-containing seasonings to food.  The role of sodium in lung disease reviewed with pt. Nutrition Diagnosis   Food-and nutrition-related knowledge deficit related to lack of exposure to information as related to diagnosis of pulmonary disease   Obesity related to excessive energy intake as evidenced by a BMI of 32.6 Nutrition Rx/Est. Daily Nutrition Needs for: ? wt loss 1300-1800 Kcal  75-90 gm protein   1500 mg or less sodium      Nutrition Intervention   Pt's individual nutrition plan and goals reviewed with pt.   Benefits of adopting healthy eating habits discussed when pt's Rate Your Plate reviewed.   Pt to attend the Nutrition and Lung Disease class   Handout given for 1500 kcal, 5-day menu ideas   Continual client-centered nutrition education by RD, as part of interdisciplinary care. Goal(s) 1. Identify food quantities necessary to achieve wt loss of  -2# per week to a goal wt of 78.6-86.8 kg (172-190 lb) at graduation from pulmonary rehab. 2. Describe the benefit of including fruits, vegetables, whole grains, and low-fat dairy products in a healthy meal plan. Monitor and Evaluate progress toward nutrition goal with team.   Mickle Plumb, M.Ed, RD, LDN, CDE 02/28/2013 3:22 PM

## 2013-03-05 ENCOUNTER — Encounter (HOSPITAL_COMMUNITY)
Admission: RE | Admit: 2013-03-05 | Discharge: 2013-03-05 | Disposition: A | Discharge status: Home / Self Care | Payer: Medicare Other | Source: Ambulatory Visit | Attending: Internal Medicine | Admitting: Internal Medicine

## 2013-03-07 ENCOUNTER — Encounter (HOSPITAL_COMMUNITY)
Admission: RE | Admit: 2013-03-07 | Discharge: 2013-03-07 | Disposition: A | Discharge status: Home / Self Care | Payer: Medicare Other | Source: Ambulatory Visit | Attending: Internal Medicine | Admitting: Internal Medicine

## 2013-03-12 ENCOUNTER — Encounter (HOSPITAL_COMMUNITY)
Admission: RE | Admit: 2013-03-12 | Discharge: 2013-03-12 | Disposition: A | Discharge status: Home / Self Care | Payer: Medicare Other | Source: Ambulatory Visit | Attending: Internal Medicine | Admitting: Internal Medicine

## 2013-03-14 ENCOUNTER — Encounter (HOSPITAL_COMMUNITY)
Admission: RE | Admit: 2013-03-14 | Discharge: 2013-03-14 | Disposition: A | Discharge status: Home / Self Care | Payer: Medicare Other | Source: Ambulatory Visit | Attending: Internal Medicine | Admitting: Internal Medicine

## 2013-03-16 ENCOUNTER — Emergency Department (HOSPITAL_BASED_OUTPATIENT_CLINIC_OR_DEPARTMENT_OTHER): Payer: Medicare Other

## 2013-03-16 ENCOUNTER — Encounter (HOSPITAL_BASED_OUTPATIENT_CLINIC_OR_DEPARTMENT_OTHER): Payer: Self-pay | Admitting: *Deleted

## 2013-03-16 DIAGNOSIS — I10 Essential (primary) hypertension: Secondary | ICD-10-CM | POA: Insufficient documentation

## 2013-03-16 DIAGNOSIS — Z7982 Long term (current) use of aspirin: Secondary | ICD-10-CM | POA: Insufficient documentation

## 2013-03-16 DIAGNOSIS — Z79899 Other long term (current) drug therapy: Secondary | ICD-10-CM | POA: Insufficient documentation

## 2013-03-16 DIAGNOSIS — Z862 Personal history of diseases of the blood and blood-forming organs and certain disorders involving the immune mechanism: Secondary | ICD-10-CM | POA: Insufficient documentation

## 2013-03-16 DIAGNOSIS — M25579 Pain in unspecified ankle and joints of unspecified foot: Secondary | ICD-10-CM | POA: Insufficient documentation

## 2013-03-16 DIAGNOSIS — Z87828 Personal history of other (healed) physical injury and trauma: Secondary | ICD-10-CM | POA: Insufficient documentation

## 2013-03-16 DIAGNOSIS — I739 Peripheral vascular disease, unspecified: Secondary | ICD-10-CM | POA: Insufficient documentation

## 2013-03-16 DIAGNOSIS — E669 Obesity, unspecified: Secondary | ICD-10-CM | POA: Insufficient documentation

## 2013-03-16 DIAGNOSIS — Z87891 Personal history of nicotine dependence: Secondary | ICD-10-CM | POA: Insufficient documentation

## 2013-03-16 DIAGNOSIS — Z8639 Personal history of other endocrine, nutritional and metabolic disease: Secondary | ICD-10-CM | POA: Insufficient documentation

## 2013-03-16 DIAGNOSIS — Z8709 Personal history of other diseases of the respiratory system: Secondary | ICD-10-CM | POA: Insufficient documentation

## 2013-03-16 DIAGNOSIS — Z87448 Personal history of other diseases of urinary system: Secondary | ICD-10-CM | POA: Insufficient documentation

## 2013-03-16 DIAGNOSIS — Z8679 Personal history of other diseases of the circulatory system: Secondary | ICD-10-CM | POA: Insufficient documentation

## 2013-03-16 NOTE — ED Notes (Addendum)
Pt states that he has an old injury to left foot/leg. States he is having pain to his left foot that started yesterday. Denies any injury. Pt has decreased feeling to foot due to previous injury. Redness noted to great toe and redness on his foot. Hx of cellulitis.  Denies any fevers. Pt also has drop foot on the left.

## 2013-03-17 ENCOUNTER — Emergency Department (HOSPITAL_BASED_OUTPATIENT_CLINIC_OR_DEPARTMENT_OTHER)
Admission: EM | Admit: 2013-03-17 | Discharge: 2013-03-17 | Disposition: A | Discharge status: Home / Self Care | Payer: Medicare Other | Attending: Emergency Medicine | Admitting: Emergency Medicine

## 2013-03-17 MED ORDER — CEPHALEXIN 250 MG PO CAPS
500.0000 mg | ORAL_CAPSULE | Freq: Once | ORAL | Status: AC
  Administered 2013-03-17: 500 mg via ORAL
  Filled 2013-03-17: qty 2

## 2013-03-17 MED ORDER — HYDROCODONE-ACETAMINOPHEN 5-325 MG PO TABS
1.0000 | ORAL_TABLET | Freq: Once | ORAL | Status: AC
  Administered 2013-03-17: 1 via ORAL
  Filled 2013-03-17: qty 1

## 2013-03-17 MED ORDER — CEPHALEXIN 500 MG PO CAPS: 500.0000 mg | ORAL_CAPSULE | Freq: Four times a day (QID) | ORAL | Status: DC

## 2013-03-17 MED ORDER — NAPROXEN 250 MG PO TABS
500.0000 mg | ORAL_TABLET | Freq: Once | ORAL | Status: AC
  Administered 2013-03-17: 500 mg via ORAL
  Filled 2013-03-17: qty 2

## 2013-03-17 MED ORDER — HYDROCODONE-ACETAMINOPHEN 5-325 MG PO TABS: 1.0000 | ORAL_TABLET | Freq: Four times a day (QID) | ORAL | Status: DC | PRN

## 2013-03-17 NOTE — ED Notes (Signed)
Pt w/ LLE pain - pt w/ hx of foot drop to that extremity, redness to left great toe noted by spouse and pt was concerned for infection. Pt also c/o extremity spasms and pain.
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
## 2013-03-17 NOTE — ED Notes (Signed)
Pt ambulating independently on d/c in no acute distress, A&Ox4. D/c instructions reviewed w/ pt and family - pt and family deny any further questions or concerns at present. Rx given x2, Pt instructed to not use alcohol, drive, or operate heavy machinery while take the prescription pain medications as they could make him drowsy - pt verbalized understanding.

## 2013-03-19 ENCOUNTER — Encounter (HOSPITAL_COMMUNITY)
Admission: RE | Admit: 2013-03-19 | Discharge: 2013-03-19 | Disposition: A | Discharge status: Home / Self Care | Payer: Medicare Other | Source: Ambulatory Visit | Attending: Internal Medicine | Admitting: Internal Medicine

## 2013-03-21 ENCOUNTER — Encounter (HOSPITAL_COMMUNITY)
Admission: RE | Admit: 2013-03-21 | Discharge: 2013-03-21 | Disposition: A | Discharge status: Home / Self Care | Payer: Medicare Other | Source: Ambulatory Visit | Attending: Internal Medicine | Admitting: Internal Medicine

## 2013-03-26 ENCOUNTER — Encounter (HOSPITAL_COMMUNITY): Payer: Medicare Other

## 2013-03-28 ENCOUNTER — Encounter (HOSPITAL_COMMUNITY): Payer: Medicare Other

## 2013-04-02 ENCOUNTER — Encounter (HOSPITAL_COMMUNITY)
Admission: RE | Admit: 2013-04-02 | Discharge: 2013-04-02 | Disposition: A | Discharge status: Home / Self Care | Payer: Medicare Other | Source: Ambulatory Visit | Attending: Internal Medicine | Admitting: Internal Medicine

## 2013-04-02 DIAGNOSIS — E785 Hyperlipidemia, unspecified: Secondary | ICD-10-CM | POA: Insufficient documentation

## 2013-04-02 DIAGNOSIS — I119 Hypertensive heart disease without heart failure: Secondary | ICD-10-CM | POA: Insufficient documentation

## 2013-04-02 DIAGNOSIS — Z5189 Encounter for other specified aftercare: Secondary | ICD-10-CM | POA: Insufficient documentation

## 2013-04-02 DIAGNOSIS — J438 Other emphysema: Secondary | ICD-10-CM | POA: Insufficient documentation

## 2013-04-02 DIAGNOSIS — J8410 Pulmonary fibrosis, unspecified: Secondary | ICD-10-CM | POA: Insufficient documentation

## 2013-04-02 DIAGNOSIS — I447 Left bundle-branch block, unspecified: Secondary | ICD-10-CM | POA: Insufficient documentation

## 2013-04-04 ENCOUNTER — Encounter (HOSPITAL_COMMUNITY): Payer: Medicare Other

## 2013-04-04 ENCOUNTER — Telehealth (HOSPITAL_COMMUNITY): Payer: Self-pay | Admitting: Internal Medicine

## 2013-04-09 ENCOUNTER — Encounter (HOSPITAL_COMMUNITY)
Admission: RE | Admit: 2013-04-09 | Discharge: 2013-04-09 | Disposition: A | Discharge status: Home / Self Care | Payer: Medicare Other | Source: Ambulatory Visit | Attending: Internal Medicine | Admitting: Internal Medicine

## 2013-04-11 ENCOUNTER — Encounter (HOSPITAL_COMMUNITY)
Admission: RE | Admit: 2013-04-11 | Discharge: 2013-04-11 | Disposition: A | Discharge status: Home / Self Care | Payer: Medicare Other | Source: Ambulatory Visit | Attending: Internal Medicine | Admitting: Internal Medicine

## 2013-04-16 ENCOUNTER — Encounter (HOSPITAL_COMMUNITY): Payer: Medicare Other

## 2013-04-18 ENCOUNTER — Encounter (HOSPITAL_COMMUNITY): Payer: Medicare Other

## 2013-04-19 ENCOUNTER — Encounter (HOSPITAL_COMMUNITY): Payer: Self-pay

## 2013-04-19 NOTE — Progress Notes (Deleted)
[place]                Outcomes  Anthropometrics: Entrance:    Height (inches): (***)   Weight (kg): (***)   Grip strength was measured using a Dynamometer.  The patient's highest score was a (***). Discharge:   Height (inches): (***)   Weight (kg): (***)    Grip strength was measured using a Dynamometer.  The patient's highest score was a (***) .  Functional Status/Exercise Capacity: Entrance:   Lee [name] had a resting heart rate of (***) BPM, a resting blood pressure of (***), and an oxygen saturation of (***) % on (***) liters of O2.  Lee [name] performed a 6-minute walk test on (***).  The patient completed (***) feet in 6 minutes with (***) rest breaks.  This quantifies (***) METS.  Discharge:   Lee [name] had a resting heart rate of (***) BPM, a resting blood pressure of (***), and an oxygen saturation of (***) % on (***) liters of O2.  Lee [name] performed a 6-minute walk test on (***).  The patient completed (***) feet in 6 minutes with (***) rest breaks.  This quantifies (***) METS. A 10% increase has been deemed clinically significant in patients with COPD.  Dyspnea Measures:   The [facility] is a simple and standardized method of classifying disability in patients with COPD.  The assessment correlates disability and dyspnea.  At entrance the patient scored a (***) and upon discharge the patient scored a (***). The scale is provided below.   0= I only get breathless with strenuous exercise. 1= I get short of breath when hurrying on level ground or walking up a slight incline. 2= On level ground, I walk slower than people of the same age because of breathlessness, or have to stop for breath when walking at my own pace. 3= I stop for breath after walking 100 yards or after a few minutes on level ground. 4=I am too breathless to leave the house or I am breathless when dressing.     The patient completed the [facility] Shortness Of Breath  Questionnaire (UCSD Echo).  This questionnaire relates activities of daily living and shortness of breath.  The score ranges from 0-120, a higher score relates to severe shortness of breath during activities of daily living. The patient's score at entrance was (***) and (***) upon discharge.    Quality of Life:   Ferrans and Powers Quality of Life Index Pulmonary Version is used to assess the patients satisfaction in different domains of their life; health and functioning, socioeconomic, psychological/spiritual, and family. The overall score is recorded out of 30 points.  The patient's goal is to achieve an overall score of 21 or higher.  Lee [name] received a (***) at entrance and a (***) upon discharge.      The Patient Health Questionnaire (PHQ-9) assesses the degree of depression.  Depression is important to monitor and track in pulmonary patients due to its prevalence in the population.  The goal for a patient is to score less than 4 on this assessment.  Lee [name] scored a (***) at entrance and (***) upon discharge.    Clinical Assessment Tools:   The COPD Assessment Test (CAT) is a measurement tool to quantify how much of an impact the disease has on the patient's life.  This assessment aids the Pulmonary Rehab Team in designing the patients individualized treatment plan.  A CAT score ranges from 0-40.  A score of 10 or  below indicates that COPD has a low impact on the patient's life whereas a score of 30 or higher indicates a severe impact. The patient's goal is a decrease of 1 point from entrance to discharge.  Lee [name] had a CAT score of (***) at entrance and (***) upon discharge.    Nutrition:   The "Rate My Plate" is a dietary assessment that quantifies the balance of a patient's diet.  This tool allows the Pulmonary Rehab Team to key in on the areas of the patient's diet that needs improving.  The team can then focus their nutritional education on those areas.  If the patient scores 23-38, this  means there are many ways they can make their eating habits healthier, 39-54 states there are some ways they can make their eating habits healthier and a score of 55-69 states that they are making many healthy choices.  Lee [name] scored a (***) at entrance and a (***) upon discharge.    Education:   Lee [name] attended most education classes.  Education classes that were offered to the patient were Activities of Daily Living and Energy Conservation, Pursed Lip Breathing and Diaphragmatic Breathing, Nutrition, Exercise for the Pulmonary Patient, Warning Signs of Infection, Chronic Lung Disease, Advanced Directives, Medications, and Stress and Meditation.  The patient completed an education assessment at the entrance of the program and at discharge.  This assessment included 14 questions regarding all of the education topics above.  Lee [name] achieved a score of (***)/14 at entrance and a (***)/14 upon discharge.     Smoking Cessation (***)  Exercise:   Lee [name] was provided with a Home Exercise Prescription (HEP) at the entrance of the program.  The patient was followed by the Pulmonary Exercise Physiologist throughout the program to assist with progressing the frequency, intensity, time, and type of exercise.  At entrance of the program, the patient was exercising (***) days at home.  At discharge the patient is exercising (***) days at home.  After graduation from Pulmonary Rehab, Lee [name] will continue exercising at (***).

## 2013-05-06 ENCOUNTER — Encounter (HOSPITAL_COMMUNITY): Payer: Self-pay

## 2013-06-05 ENCOUNTER — Encounter: Payer: Self-pay | Admitting: Podiatry

## 2013-06-05 ENCOUNTER — Ambulatory Visit (INDEPENDENT_AMBULATORY_CARE_PROVIDER_SITE_OTHER): Payer: Medicare Other | Admitting: Podiatry

## 2013-06-05 VITALS — BP 137/72 | HR 78 | Temp 97.6°F | Resp 12 | Ht 66.0 in | Wt 195.0 lb

## 2013-06-05 DIAGNOSIS — L97509 Non-pressure chronic ulcer of other part of unspecified foot with unspecified severity: Secondary | ICD-10-CM

## 2013-06-05 DIAGNOSIS — L02619 Cutaneous abscess of unspecified foot: Secondary | ICD-10-CM

## 2013-06-05 MED ORDER — CEPHALEXIN 500 MG PO CAPS: 500.0000 mg | ORAL_CAPSULE | Freq: Four times a day (QID) | ORAL | Status: DC

## 2013-06-05 NOTE — Progress Notes (Signed)
Patient ID: Lee [name], male   DOB: [birth year], 79 y.o.   MRN: [id number]  This 79 year old white male presents for ongoing care for a traumatic skin also the left foot. 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  Objective: The left hallux demonstrates a low-grade erythema edema and slight warmth. The plantar ulcer of left hallux is several millimeters in diameter with no active drainage noted no malodor noted.  Assessment: Ulceration cellulitis left hallux.  Plan: Cephalexin 500 mg by mouth 4 times a day x10 days prescribed. Maintain Silvadene dressing to the left hallux . In addition could use a foam pad to redistribute some of the weight off the area. Patient advised to limit standing and walking. He is advised that if he notices notices any significant increase of redness or streaking or fever in his foot or leg to present to the ER otherwise we'll reevaluate in the next 7 days.  Richard C.Leeanne Deed, DPM

## 2013-06-05 NOTE — Patient Instructions (Signed)
Apply silvadene cream to wound daily, after soaking with antibaterial soft soap,attach foam pad and cover with gauze Start antibiotics today.

## 2013-06-12 ENCOUNTER — Encounter: Payer: Self-pay | Admitting: Podiatry

## 2013-06-12 ENCOUNTER — Ambulatory Visit (INDEPENDENT_AMBULATORY_CARE_PROVIDER_SITE_OTHER): Payer: Medicare Other | Admitting: Podiatry

## 2013-06-12 VITALS — BP 132/69 | HR 73 | Temp 97.9°F | Resp 20 | Ht 66.0 in | Wt 196.0 lb

## 2013-06-12 DIAGNOSIS — L97509 Non-pressure chronic ulcer of other part of unspecified foot with unspecified severity: Secondary | ICD-10-CM

## 2013-06-12 NOTE — Progress Notes (Signed)
Patient ID: Lee [name], male   DOB: 1931-03-31, 79 y.o.   MRN: [id number]   Subjective: This 79 year old white male presents for followup care for traumatic ulcer on the left hallux. He is currently completing cephalexin 500 mg 4 times a day on the visit of 06/05/2013 and denies any complaints from the medication.  Objective: Left hallux demonstrates hyperkeratotic tissue on the plantar aspect and after debridement a 3 mm superficial ulcer is noted. There is no active drainage erythema malodor noted.  Assessment: Resolving cellulitis left hallux.  Traumatic ulcer left hallux Dropfoot deformity left foot.  Plan: The ulcer of the left hallux was debrided back, packed with Silvadene cream and covered with a gauze dressing. The patient's wife will continue daily wound care including a protective bandage with Silvadene cream. He has been evaluated for dropfoot brace but currently is pending.  Reappoint in the next 7 days.

## 2013-06-12 NOTE — Patient Instructions (Signed)
Maintain Silvadene dressing to the left great toe, daily.

## 2013-06-19 ENCOUNTER — Ambulatory Visit (INDEPENDENT_AMBULATORY_CARE_PROVIDER_SITE_OTHER): Payer: Medicare Other | Admitting: Podiatry

## 2013-06-19 ENCOUNTER — Encounter: Payer: Self-pay | Admitting: Podiatry

## 2013-06-19 VITALS — BP 135/67 | HR 66 | Temp 96.7°F | Resp 20 | Ht 66.0 in | Wt 195.0 lb

## 2013-06-19 DIAGNOSIS — L97509 Non-pressure chronic ulcer of other part of unspecified foot with unspecified severity: Secondary | ICD-10-CM

## 2013-06-19 NOTE — Progress Notes (Signed)
Patient ID: Lee [name], male   DOB: 1930-12-11, 79 y.o.   MRN: [id number]   Subjective: This 79 year old white male presents with his wife for ongoing care for a traumatic skin ulcer on his left hallux.  Objective: Dropfoot deformity noted left. Hemorrhagic hyperkeratotic tissue so plantar left hallux  Assessment: Pre-ulcerative hyperkeratotic tissue left hallux Dropfoot deformity left   Plan: The hyperkeratotic tissue was debrided and there is fragile closure of the underlying skin ulcer. He will continue to apply a foam pad around the area and attached with Coflex. We are waiting for a dropfoot brace from orthotist. Reappoint at three week intervals or sooner if patient is a concern.  Richard C.Leeanne Deed, DPM

## 2013-06-19 NOTE — Patient Instructions (Signed)
Apply foam pad around the skin ulcer on the left great toe and attach with Coflex tape. Use Silvadene cream if you notice drainage.

## 2013-07-10 ENCOUNTER — Encounter: Payer: Self-pay | Admitting: Podiatry

## 2013-07-10 ENCOUNTER — Ambulatory Visit (INDEPENDENT_AMBULATORY_CARE_PROVIDER_SITE_OTHER): Payer: Medicare Other | Admitting: Podiatry

## 2013-07-10 VITALS — BP 113/62 | HR 82 | Temp 97.7°F | Resp 16

## 2013-07-10 DIAGNOSIS — L97509 Non-pressure chronic ulcer of other part of unspecified foot with unspecified severity: Secondary | ICD-10-CM

## 2013-07-10 NOTE — Patient Instructions (Signed)
Apply Silvadene to ulcer on the left great toe, attach foam pad around the ulcer site, cover with gauze and applied Coflex tape. Change dressing daily. Reappoint at three 3 weeks, sooner if you have a concern.

## 2013-07-10 NOTE — Progress Notes (Signed)
Patient ID: Lee [name], male   DOB: 1931/05/31, 79 y.o.   MRN: [id number]  Subjective: This 79 year old white male presents with his wife for followup care for traumatic ulceration on the left hallux. He is scheduled for evaluation for a dropfoot brace left on December 1.  Objective: Dropfoot left present. Hemorrhagic hyperkeratoses with superficial ulcer measuring 3 x 1 mm on the plantar aspect of the left hallux. No erythema edema or active drainage noted.  Assessment: Traumatic skin ulcer left hallux , associated with left dropfoot.  Plan: The ulcer and left hallux was debrided, packed with Silvadene cream, protected with foam pad gauze and attach with Coflex tape. Patient's wife will continue this treatment on a daily basis. Reappoint at three-week intervals sooner if patient has concern.

## 2013-07-30 ENCOUNTER — Other Ambulatory Visit: Payer: Self-pay | Admitting: Dermatology

## 2013-07-31 ENCOUNTER — Encounter: Payer: Self-pay | Admitting: Podiatry

## 2013-07-31 ENCOUNTER — Ambulatory Visit (INDEPENDENT_AMBULATORY_CARE_PROVIDER_SITE_OTHER): Payer: Medicare Other | Admitting: Podiatry

## 2013-07-31 VITALS — BP 132/65 | HR 77 | Temp 97.7°F | Resp 20

## 2013-07-31 DIAGNOSIS — L97509 Non-pressure chronic ulcer of other part of unspecified foot with unspecified severity: Secondary | ICD-10-CM

## 2013-07-31 NOTE — Patient Instructions (Signed)
Maintain Silvadene cream and protective foam dressing around the skin ulcer on the left great toe. Wear dropfoot brace on a daily basis. Please wear a Velcro lace up style shoes to accommodate left dropfoot brace in treatment of a traumatic skin ulcer on the left great toe.

## 2013-07-31 NOTE — Progress Notes (Signed)
Patient ID: Pershing Cox, male   DOB: 05-01-1931, 79 y.o.   MRN: [id number]  Subjective: This 79 year old white male presents for ongoing care of traumatic skin ulcer on the left hallux. He began wearing a left dropfoot brace on 07/22/2013. He says he is able to wear the brace on a daily basis.  Objective: Left dropfoot present. Hemorrhagic hyperkeratotic lesion with linear ulcer on the medial plantar left hallux measuring 1 x 5 mm. Small amount of hemorrhagic debris surrounding the wound. No erythema, edema, drainage, warmth, malodor noted.  Assessment: Traumatic skin ulcer left hallux that clinically his noninfected. Dropfoot deformity left  Plan: The ulcer site is debrided back, Silvadene applied with a protective foam pad gauze and attach with Coflex tape. Patient will continue to do this on a daily basis. He wears dropfoot brace. Reevaluate in 3 weeks or sooner if patient has concern.

## 2013-08-21 ENCOUNTER — Encounter: Payer: Self-pay | Admitting: Podiatry

## 2013-08-21 ENCOUNTER — Ambulatory Visit (INDEPENDENT_AMBULATORY_CARE_PROVIDER_SITE_OTHER): Payer: Medicare Other | Admitting: Podiatry

## 2013-08-21 VITALS — BP 117/59 | HR 84 | Temp 97.8°F | Resp 24

## 2013-08-21 DIAGNOSIS — L97509 Non-pressure chronic ulcer of other part of unspecified foot with unspecified severity: Secondary | ICD-10-CM

## 2013-08-21 DIAGNOSIS — L02619 Cutaneous abscess of unspecified foot: Secondary | ICD-10-CM

## 2013-08-21 MED ORDER — CEPHALEXIN 500 MG PO CAPS: 500.0000 mg | ORAL_CAPSULE | Freq: Four times a day (QID) | ORAL | Status: DC

## 2013-08-21 NOTE — Patient Instructions (Signed)
Betadine Soak Instructions  Purchase an 8 oz. bottle of BETADINE solution (Povidone)  THE DAY AFTER THE PROCEDURE  Place 1 tablespoon of betadine solution in a quart of warm tap water.  Submerge your foot or feet with outer bandage intact for the initial soak; this will allow the bandage to become moist and wet for easy lift off.  Once you remove your bandage, continue to soak in the solution for 20 minutes.  This soak should be done twice a day.  Next, remove your foot or feet from solution, blot dry the affected area and cover.  You may use a band aid large enough to cover the area or use gauze and tape.  Apply other medications to the area as directed by the doctor such as cortisporin otic solution (ear drops) or neosporin.  IF YOUR SKIN BECOMES IRRITATED WHILE USING THESE INSTRUCTIONS, IT IS OKAY TO SWITCH TO EPSOM SALTS AND WATER OR WHITE VINEGAR AND WATER.  Apply Silvadene cream to ulcer after Betadine soak daily

## 2013-08-21 NOTE — Progress Notes (Signed)
Patient ID: Lee [name], male   DOB: 1930/09/15, 79 y.o.   MRN: [id number]  Subjective: Patient presents with wife for ongoing traumatic skin ulcer on the left hallux. His been wearing a dropfoot brace is 11/20/2012. He is now complaining of some intermittent pain throbbing in around ulcer site.  Objective: Left dropfoot present. Ulcer on the medial plantar left hallux after debridement measures 10 x 5 mm with slight serous drainage, no malodor no warmth with slight erythema and hyperkeratotic tissue.  Assessment: Traumatic ulceration left hallux with low-grade cellulitis Dropfoot deformity left  Plan: The ulcer is debrided packed with Silvadene and protected with a foam pad attach with Coflex. Cephalexin 500 mg #28 1 by mouth 4 times a day was prescribed. Maintain local wound care including cleansing with diluted Betadine, application of Silvadene cream and a protective bandage. Maintain dropfoot brace left. Reappoint x7 days

## 2013-09-04 ENCOUNTER — Encounter: Payer: Self-pay | Admitting: Podiatry

## 2013-09-04 ENCOUNTER — Ambulatory Visit (INDEPENDENT_AMBULATORY_CARE_PROVIDER_SITE_OTHER): Payer: Medicare Other | Admitting: Podiatry

## 2013-09-04 VITALS — BP 121/65 | HR 84 | Temp 97.9°F | Resp 12

## 2013-09-04 DIAGNOSIS — L97509 Non-pressure chronic ulcer of other part of unspecified foot with unspecified severity: Secondary | ICD-10-CM

## 2013-09-04 NOTE — Progress Notes (Signed)
   Subjective:    Patient ID: Lee [name], male    DOB: July 10, 1931, 80 y.o.   MRN: [id number]  HPI Comments: '' LT BIG TOE LOOKS MUCH BETTER, BUT STILL LITTLE SORE.''  Toe Pain   He presents today for ongoing care for traumatic skin ulcer on the left hallux under care since for 4/1/ 2014. On the visit of 08/21/2013 he complains and throbbing around the ulcer site and a cephalexin was prescribed. He is taking cephalexin without any complaints from medication.   Review of Systems     Objective:   Physical Exam  Orientated x36 80 year old white male presents with his wife  Dermatological: Superficial ulcer on the left hallux measuring  7 mm x 5 mm with a granular base. The ulcer site is surrounded by hyperkeratotic tissue. No erythema, edema, warmth or active drainage noted.        Assessment & Plan:   Assessment: Noninfected traumatic ulceration left hallux Dropfoot deformity left  Plan: The ulcer site is debrided, packed with Silvadene and a foam pad applied. Patient will continue daily wound care including cleansing with dial antibacterial soft soaks, applying Silvadene and attaching foam pad around the skin ulcer on the left hallux. He will maintain a dropfoot brace left. Complete any previous cephalexin. Reevaluate x2 weeks, or sooner if patient has concern.

## 2013-09-04 NOTE — Patient Instructions (Signed)
Apply Silvadene to the ulcer on the left great toe, attach a foam pad around ulcer and secure with Coflex tape.

## 2013-09-18 ENCOUNTER — Ambulatory Visit: Payer: Medicare Other | Admitting: Podiatry

## 2013-09-18 ENCOUNTER — Encounter: Payer: Self-pay | Admitting: Podiatry

## 2013-09-18 VITALS — BP 128/58 | HR 80 | Resp 12

## 2013-09-18 DIAGNOSIS — L97509 Non-pressure chronic ulcer of other part of unspecified foot with unspecified severity: Secondary | ICD-10-CM

## 2013-09-18 NOTE — Progress Notes (Signed)
Patient ID: Lee [name], male   DOB: May 16, 1931, 80 y.o.   MRN: [id number]  Subjective: this orientated x71 80 year old white male presents for ongoing treatment for traumatic ulceration on his left hallux since 11/20/2012. He is currently wearing a dropfoot brace.  Objective: An oval-shaped ulceration noted on the left hallux surrounded by hyperkeratotic tissue measuring 10 x 5 mm with a granular base. There is no erythema, edema or drainage noted. Left dropfoot present  Assessment: Chronic traumatic ulceration left hallux  Plan: The ulcer was debrided back. Today I dispensed thick surgical fill pads that are going to be cut into U-shaped pads to place around the ulcer on the left hallux to see if we could further offload this area. He will continue to apply Silvadene plus this thick pad to the left hallux on a daily basis. Additional pads were dispensed to patient today.  Reappoint x2 weeks

## 2013-09-18 NOTE — Patient Instructions (Signed)
Cut pads from the surgical fell that are U-shaped. Apply this pad around the ulcer, apply Silvadene and attach with Coflex tape.

## 2013-09-19 ENCOUNTER — Encounter: Payer: Self-pay | Admitting: Internal Medicine

## 2013-09-19 ENCOUNTER — Ambulatory Visit (INDEPENDENT_AMBULATORY_CARE_PROVIDER_SITE_OTHER): Payer: Medicare Other | Admitting: Internal Medicine

## 2013-09-19 ENCOUNTER — Other Ambulatory Visit: Payer: Self-pay | Admitting: Internal Medicine

## 2013-09-19 VITALS — BP 120/80 | HR 95 | Ht 65.0 in | Wt 197.0 lb

## 2013-09-19 DIAGNOSIS — R0989 Other specified symptoms and signs involving the circulatory and respiratory systems: Secondary | ICD-10-CM

## 2013-09-19 DIAGNOSIS — J438 Other emphysema: Secondary | ICD-10-CM

## 2013-09-19 DIAGNOSIS — K219 Gastro-esophageal reflux disease without esophagitis: Secondary | ICD-10-CM

## 2013-09-19 DIAGNOSIS — R0609 Other forms of dyspnea: Secondary | ICD-10-CM

## 2013-09-19 DIAGNOSIS — R0602 Shortness of breath: Secondary | ICD-10-CM

## 2013-09-19 DIAGNOSIS — J84112 Idiopathic pulmonary fibrosis: Secondary | ICD-10-CM

## 2013-09-19 LAB — PULMONARY FUNCTION TEST
DL/VA % PRED: 80 %
DL/VA: 3.39 ml/min/mmHg/L
DLCO UNC % PRED: 55 %
DLCO unc: 14.12 ml/min/mmHg
FEF 25-75 PRE: 1.86 L/s
FEF 25-75 Post: 1.54 L/sec
FEF2575-%Change-Post: -16 %
FEF2575-%Pred-Post: 104 %
FEF2575-%Pred-Pre: 125 %
FEV1-%CHANGE-POST: -5 %
FEV1-%Pred-Post: 97 %
FEV1-%Pred-Pre: 102 %
FEV1-PRE: 2.28 L
FEV1-Post: 2.16 L
FEV1FVC-%Change-Post: -6 %
FEV1FVC-%Pred-Pre: 104 %
FEV6-%Change-Post: 5 %
FEV6-%PRED-POST: 104 %
FEV6-%PRED-PRE: 99 %
FEV6-POST: 3.07 L
FEV6-PRE: 2.92 L
FEV6FVC-%CHANGE-POST: 0 %
FEV6FVC-%PRED-POST: 107 %
FEV6FVC-%PRED-PRE: 107 %
FVC-%Change-Post: 1 %
FVC-%PRED-PRE: 96 %
FVC-%Pred-Post: 97 %
FVC-POST: 3.1 L
FVC-PRE: 3.06 L
POST FEV6/FVC RATIO: 99 %
Post FEV1/FVC ratio: 69 %
Pre FEV1/FVC ratio: 74 %
Pre FEV6/FVC Ratio: 99 %

## 2013-09-19 MED ORDER — AZITHROMYCIN 250 MG PO TABS: ORAL_TABLET | ORAL | Status: DC

## 2013-09-19 MED ORDER — PREDNISONE 10 MG PO TABS: ORAL_TABLET | ORAL | Status: DC

## 2013-09-19 MED ORDER — TIOTROPIUM BROMIDE MONOHYDRATE 18 MCG IN CAPS: 18.0000 ug | ORAL_CAPSULE | Freq: Every day | RESPIRATORY_TRACT | Status: DC

## 2013-09-19 NOTE — Patient Instructions (Addendum)
You have a combination combination of emphysema and pulmonary fibrosis; IPF disease YOu are also recovering from a  Flare up of emphysema  #emphsema flare up Zpak for 5 days Take prednisone 40 mg daily x 2 days, then 20mg daily x 2 days, then 10mg daily x 2 days, then 5mg daily x 2 days and stop   #For emphysema - Continue spiriva 1 puff daily - take samples worth 2 months and show technique - Continue oxygen at night but yes use saline nasal spray and gel to prevent bleeding nose -Continue pulmonary rehabilitation - STOP N. acetylcysteine 600 mg 3 times a day;   #For pulmonary fibrosis - Disease called IPF - Continue over-the-counter Prilosec 20 mg once a day before breakfast on an empty stomach - In future we can re- discuss new treatments for this disease (in 2014 you wanted to hold off)  -# acid reflux -  Continue Prilosec. Continue the same  #Followup - 6 mmnths - PFT at followup 

## 2013-09-19 NOTE — Progress Notes (Signed)
PFT done today. 
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
## 2013-09-22 NOTE — Assessment & Plan Note (Signed)
You have a combination combination of emphysema and pulmonary fibrosis; IPF disease YOu are also recovering from a  Flare up of emphysema  #emphsema flare up Zpak for 5 days Take prednisone 40 mg daily x 2 days, then 20mg  daily x 2 days, then 10mg  daily x 2 days, then 5mg  daily x 2 days and stop   #For emphysema - Continue spiriva 1 puff daily - take samples worth 2 months and show technique - Continue oxygen at night but yes use saline nasal spray and gel to prevent bleeding nose -Continue pulmonary rehabilitation - STOP N. acetylcysteine 600 mg 3 times a day;   #For pulmonary fibrosis - Disease called IPF - Continue over-the-counter Prilosec 20 mg once a day before breakfast on an empty stomach - In future we can re- discuss new treatments for this disease (in 2014 you wanted to hold off)  -# acid reflux -  Continue Prilosec. Continue the same  #Followup - 6 mmnths - PFT at followup

## 2013-09-22 NOTE — Assessment & Plan Note (Signed)
You have a combination combination of emphysema and pulmonary fibrosis; IPF disease YOu are also recovering from a  Flare up of emphysema  #emphsema flare up Zpak for 5 days Take prednisone 40 mg daily x 2 days, then 20mg daily x 2 days, then 10mg daily x 2 days, then 5mg daily x 2 days and stop   #For emphysema - Continue spiriva 1 puff daily - take samples worth 2 months and show technique - Continue oxygen at night but yes use saline nasal spray and gel to prevent bleeding nose -Continue pulmonary rehabilitation - STOP N. acetylcysteine 600 mg 3 times a day;   #For pulmonary fibrosis - Disease called IPF - Continue over-the-counter Prilosec 20 mg once a day before breakfast on an empty stomach - In future we can re- discuss new treatments for this disease (in 2014 you wanted to hold off)  -# acid reflux -  Continue Prilosec. Continue the same  #Followup - 6 mmnths - PFT at followup 

## 2013-10-02 ENCOUNTER — Ambulatory Visit: Payer: Medicare Other | Admitting: Podiatry

## 2013-10-07 ENCOUNTER — Encounter: Payer: Self-pay | Admitting: Podiatry

## 2013-10-07 ENCOUNTER — Ambulatory Visit (INDEPENDENT_AMBULATORY_CARE_PROVIDER_SITE_OTHER): Payer: Medicare Other | Admitting: Podiatry

## 2013-10-07 VITALS — BP 144/68 | HR 90 | Resp 18

## 2013-10-07 DIAGNOSIS — L97509 Non-pressure chronic ulcer of other part of unspecified foot with unspecified severity: Secondary | ICD-10-CM

## 2013-10-07 NOTE — Progress Notes (Signed)
° °  Subjective:    Patient ID: Lee [name], male    DOB: 1931/03/18, 80 y.o.   MRN: [id number]  HPI it is draining some on my left big toe and sore and tender and when I walk it hurts some.. Patient presents for ongoing care for traumatic skin ulcer on the left hallux under care since 11/20/2012. He is wearing a dropfoot brace.  Since his last visit he developed a respiratory infection.    Review of Systems     Objective:   Physical Exam Orientated x3 white male presents with his wife  Hyperkeratotic lesion medial plantar left hallux after debridement and has a superficial ulcer measuring 1 mm x 5 mm with some bleeding. No erythema, edema, warmth or malodor surrounds the skin ulcer.       Assessment & Plan:   Assessment: Noninfected traumatic skin ulcer left hallux  Plan: The ulcer was debrided today, packed with Silvadene and padded off. Patient will continue to apply felt pads around the area with Silvadene dressings daily basis. He'll maintain his dropfoot brace.  Reappoint x3 weeks.

## 2013-10-07 NOTE — Patient Instructions (Signed)
Maintain Silvadene cream and felt pad on a daily basis around the skin ulcer on the left great toe.

## 2013-10-11 ENCOUNTER — Encounter (HOSPITAL_COMMUNITY): Payer: Self-pay | Admitting: Emergency Medicine

## 2013-10-11 ENCOUNTER — Emergency Department (HOSPITAL_COMMUNITY)
Admission: EM | Admit: 2013-10-11 | Discharge: 2013-10-11 | Disposition: A | Discharge status: Home / Self Care | Payer: Medicare Other | Attending: Emergency Medicine | Admitting: Emergency Medicine

## 2013-10-11 ENCOUNTER — Emergency Department (HOSPITAL_COMMUNITY): Payer: Medicare Other

## 2013-10-11 DIAGNOSIS — Z79899 Other long term (current) drug therapy: Secondary | ICD-10-CM | POA: Insufficient documentation

## 2013-10-11 DIAGNOSIS — R1033 Periumbilical pain: Secondary | ICD-10-CM | POA: Insufficient documentation

## 2013-10-11 DIAGNOSIS — Z85828 Personal history of other malignant neoplasm of skin: Secondary | ICD-10-CM | POA: Insufficient documentation

## 2013-10-11 DIAGNOSIS — Z7982 Long term (current) use of aspirin: Secondary | ICD-10-CM | POA: Insufficient documentation

## 2013-10-11 DIAGNOSIS — I10 Essential (primary) hypertension: Secondary | ICD-10-CM | POA: Insufficient documentation

## 2013-10-11 DIAGNOSIS — Z87448 Personal history of other diseases of urinary system: Secondary | ICD-10-CM | POA: Insufficient documentation

## 2013-10-11 DIAGNOSIS — E049 Nontoxic goiter, unspecified: Secondary | ICD-10-CM | POA: Insufficient documentation

## 2013-10-11 DIAGNOSIS — R109 Unspecified abdominal pain: Secondary | ICD-10-CM

## 2013-10-11 DIAGNOSIS — E669 Obesity, unspecified: Secondary | ICD-10-CM | POA: Insufficient documentation

## 2013-10-11 DIAGNOSIS — R110 Nausea: Secondary | ICD-10-CM | POA: Insufficient documentation

## 2013-10-11 DIAGNOSIS — Z8709 Personal history of other diseases of the respiratory system: Secondary | ICD-10-CM | POA: Insufficient documentation

## 2013-10-11 DIAGNOSIS — R0602 Shortness of breath: Secondary | ICD-10-CM | POA: Insufficient documentation

## 2013-10-11 DIAGNOSIS — Z87891 Personal history of nicotine dependence: Secondary | ICD-10-CM | POA: Insufficient documentation

## 2013-10-11 DIAGNOSIS — K439 Ventral hernia without obstruction or gangrene: Secondary | ICD-10-CM | POA: Insufficient documentation

## 2013-10-11 LAB — URINALYSIS, ROUTINE W REFLEX MICROSCOPIC
Bilirubin Urine: NEGATIVE
GLUCOSE, UA: NEGATIVE mg/dL
HGB URINE DIPSTICK: NEGATIVE
Ketones, ur: NEGATIVE mg/dL
LEUKOCYTES UA: NEGATIVE
Nitrite: NEGATIVE
PH: 6 (ref 5.0–8.0)
PROTEIN: NEGATIVE mg/dL
SPECIFIC GRAVITY, URINE: 1.017 (ref 1.005–1.030)
Urobilinogen, UA: 0.2 mg/dL (ref 0.0–1.0)

## 2013-10-11 LAB — COMPREHENSIVE METABOLIC PANEL
ALBUMIN: 3.3 g/dL — AB (ref 3.5–5.2)
ALK PHOS: 88 U/L (ref 39–117)
ALT: 31 U/L (ref 0–53)
AST: 52 U/L — ABNORMAL HIGH (ref 0–37)
BILIRUBIN TOTAL: 0.5 mg/dL (ref 0.3–1.2)
BUN: 14 mg/dL (ref 6–23)
CHLORIDE: 103 meq/L (ref 96–112)
CO2: 24 mEq/L (ref 19–32)
Calcium: 9.2 mg/dL (ref 8.4–10.5)
Creatinine, Ser: 0.87 mg/dL (ref 0.50–1.35)
GFR calc Af Amer: >90 mL/min (ref >90)
GFR calc non Af Amer: 79 mL/min — ABNORMAL LOW (ref >90)
Glucose, Bld: 144 mg/dL — ABNORMAL HIGH (ref 70–99)
POTASSIUM: 3.9 meq/L (ref 3.7–5.3)
SODIUM: 140 meq/L (ref 137–147)
TOTAL PROTEIN: 6.8 g/dL (ref 6.0–8.3)

## 2013-10-11 LAB — CBC WITH DIFFERENTIAL/PLATELET
BASOS PCT: 0 % (ref 0–1)
Basophils Absolute: 0 10*3/uL (ref 0.0–0.1)
Eosinophils Absolute: 0.2 10*3/uL (ref 0.0–0.7)
Eosinophils Relative: 2 % (ref 0–5)
HCT: 41.8 % (ref 39.0–52.0)
HEMOGLOBIN: 14.1 g/dL (ref 13.0–17.0)
LYMPHS ABS: 1.2 10*3/uL (ref 0.7–4.0)
Lymphocytes Relative: 9 % — ABNORMAL LOW (ref 12–46)
MCH: 30.4 pg (ref 26.0–34.0)
MCHC: 33.7 g/dL (ref 30.0–36.0)
MCV: 90.1 fL (ref 78.0–100.0)
MONOS PCT: 6 % (ref 3–12)
Monocytes Absolute: 0.8 10*3/uL (ref 0.1–1.0)
NEUTROS ABS: 10.3 10*3/uL — AB (ref 1.7–7.7)
NEUTROS PCT: 83 % — AB (ref 43–77)
PLATELETS: 213 10*3/uL (ref 150–400)
RBC: 4.64 MIL/uL (ref 4.22–5.81)
RDW: 14.2 % (ref 11.5–15.5)
WBC: 12.5 10*3/uL — ABNORMAL HIGH (ref 4.0–10.5)

## 2013-10-11 LAB — I-STAT TROPONIN, ED: Troponin i, poc: 0 ng/mL (ref 0.00–0.08)

## 2013-10-11 LAB — LIPASE, BLOOD: LIPASE: 36 U/L (ref 11–59)

## 2013-10-11 MED ORDER — ONDANSETRON HCL 4 MG/2ML IJ SOLN
4.0000 mg | Freq: Once | INTRAMUSCULAR | Status: AC
  Administered 2013-10-11: 4 mg via INTRAVENOUS
  Filled 2013-10-11: qty 2

## 2013-10-11 MED ORDER — IOHEXOL 300 MG/ML  SOLN
100.0000 mL | Freq: Once | INTRAMUSCULAR | Status: AC | PRN
  Administered 2013-10-11: 100 mL via INTRAVENOUS

## 2013-10-11 MED ORDER — SODIUM CHLORIDE 0.9 % IV SOLN
INTRAVENOUS | Status: DC
  Administered 2013-10-11: 11:00:00 via INTRAVENOUS

## 2013-10-11 MED ORDER — IOHEXOL 300 MG/ML  SOLN
25.0000 mL | INTRAMUSCULAR | Status: AC
  Administered 2013-10-11: 25 mL via ORAL

## 2013-10-11 MED ORDER — SODIUM CHLORIDE 0.9 % IV BOLUS (SEPSIS)
250.0000 mL | Freq: Once | INTRAVENOUS | Status: AC
Start: 2013-10-11 — End: 2013-10-11
  Administered 2013-10-11: 250 mL via INTRAVENOUS

## 2013-10-11 NOTE — ED Notes (Signed)
Pt finished drinking oral CT contrast. Notified CT.

## 2013-10-11 NOTE — ED Notes (Signed)
Pt reports when he started eating this morning, he had a sudden onset of abd pain and sob. Pt reports the abd pain was right at the middle of abd, now the pain has gone away. Denies n/v/d. Pt sts he does have a hernia and a AAA. Reports that he has been told not to have sx on them. Nad, skin warm and dry, resp e/u.

## 2013-10-11 NOTE — ED Notes (Signed)
Per EMS - pt coming from home. Pt c/o abd pain upper quadrants, sob, weakness and nausea that started 30 mins ago while he was eating. Now pain is gone. Denies sob at this time. ems administered 4mg  of Zofran. Pt has hernia in abd, had multiple sx on it, that is where pain is located. Pt hasn't vomited. Denies diarrhea/diaphoresis. 12 lead showing left BBB with PVCs. Hx of HTN BP 151/72, BP 131/85. HR 88. RR 16. 98% on room air. CBG 186. 20G in left AC.

## 2013-10-11 NOTE — ED Notes (Signed)
Pt made aware of need for urine sample, urinal at bedside.

## 2013-10-11 NOTE — ED Provider Notes (Signed)
CSN: [id number]     Arrival date & time 10/11/13  9628 History   First MD Initiated Contact with Patient 10/11/13 [phone]     Chief Complaint  Patient presents with  . Abdominal Pain     (Consider location/radiation/quality/duration/timing/severity/associated sxs/prior Treatment) Patient is a 80 y.o. male presenting with abdominal pain. The history is provided by the patient.  Abdominal Pain Associated symptoms: nausea   Associated symptoms: no chest pain, no diarrhea, no dysuria, no fever, no shortness of breath and no vomiting    patient with acute onset of severe periumbilical abdominal pain while eating breakfast. Patient is taken first by serial and got sudden pain. Lasted for 30 minutes now completely resolved. Patient states he has a ventral hernia related to the aortic aneurysm repair. Patient had some shortness of breath and the pain was severe some nausea no vomiting. No history of similar pain all pain now resolved when it was present it is 10 out of 10 and was very sharp.  Past Medical History  Diagnosis Date  . Dyspnea   . HTN (hypertension)   . AAA (abdominal aortic aneurysm)   . Allergic rhinitis   . Erectile dysfunction   . Hyperlipidemia   . Obesity   . PVD (peripheral vascular disease)   . Basal cell carcinoma of face    Past Surgical History  Procedure Laterality Date  . Aorta surgery  2002  . Hernia repair  1979  . Urinary tract stretch      late 20s   Family History  Problem Relation Age of Onset  . Hypertension Mother   . Colon cancer Brother   . Stroke Father   . Heart disease Father    History  Substance Use Topics  . Smoking status: Former Smoker -- 1.00 packs/day for 25 years    Types: Cigarettes    Quit date: 08/22/1974  . Smokeless tobacco: Never Used  . Alcohol Use: No    Review of Systems  Constitutional: Negative for fever.  HENT: Negative for congestion.   Eyes: Negative for redness.  Respiratory: Negative for shortness of breath.    Cardiovascular: Negative for chest pain.  Gastrointestinal: Positive for nausea and abdominal pain. Negative for vomiting and diarrhea.  Genitourinary: Negative for dysuria.  Musculoskeletal: Negative for back pain.  Skin: Negative for rash.  Neurological: Negative for headaches.  Hematological: Does not bruise/bleed easily.  Psychiatric/Behavioral: Negative for confusion.      Allergies  Ciprofloxacin  Home Medications   Current Outpatient Rx  Name  Route  Sig  Dispense  Refill  . amLODipine (NORVASC) 5 MG tablet   Oral   Take 5 mg by mouth daily.         [name] aspirin 81 MG tablet   Oral   Take 81 mg by mouth daily.         . benazepril (LOTENSIN) 10 MG tablet   Oral   Take 10 mg by mouth daily.         . Cholecalciferol (VITAMIN D3) 2000 UNITS TABS   Oral   Take 1 tablet by mouth daily.         [name] ezetimibe (ZETIA) 10 MG tablet   Oral   Take 5 mg by mouth daily.          . Multiple Vitamins-Minerals (CENTRUM SILVER PO)   Oral   Take 1 tablet by mouth daily.         . niacin (NIASPAN) 500 MG CR  tablet   Oral   Take 500 mg by mouth at bedtime.         [name] omeprazole (PRILOSEC) 20 MG capsule   Oral   Take 20 mg by mouth daily.         . silver sulfADIAZINE (SILVADENE) 1 % cream               . tiotropium (SPIRIVA) 18 MCG inhalation capsule   Inhalation   Place 18 mcg into inhaler and inhale daily.          BP 129/65  Pulse 78  Temp(Src) 98.2 F (36.8 C) (Oral)  Resp 20  SpO2 94% Physical Exam  Nursing note and vitals reviewed. Constitutional: He is oriented to person, place, and time. He appears well-developed and well-nourished. No distress.  HENT:  Head: Normocephalic and atraumatic.  Mouth/Throat: Oropharynx is clear and moist.  Eyes: Conjunctivae and EOM are normal. Pupils are equal, round, and reactive to light.  Neck: Thyromegaly present.  Cardiovascular: Normal rate, regular rhythm and normal heart sounds.   No murmur  heard. Pulmonary/Chest: Effort normal and breath sounds normal. No respiratory distress.  Abdominal: Soft. Bowel sounds are normal. There is no tenderness.  Musculoskeletal: Normal range of motion.  Neurological: He is alert and oriented to person, place, and time. No cranial nerve deficit. He exhibits normal muscle tone. Coordination normal.  Skin: Skin is warm. No rash noted.    ED Course  Procedures (including critical care time) Labs Review Labs Reviewed  CBC WITH DIFFERENTIAL - Abnormal; Notable for the following:    WBC 12.5 (*)    Neutrophils Relative % 83 (*)    Neutro Abs 10.3 (*)    Lymphocytes Relative 9 (*)    All other components within normal limits  COMPREHENSIVE METABOLIC PANEL - Abnormal; Notable for the following:    Glucose, Bld 144 (*)    Albumin 3.3 (*)    AST 52 (*)    GFR calc non Af Amer 79 (*)    All other components within normal limits  LIPASE, BLOOD  URINALYSIS, ROUTINE W REFLEX MICROSCOPIC  I-STAT TROPOININ, ED   Results for orders placed during the hospital encounter of 10/11/13  CBC WITH DIFFERENTIAL      Result Value Ref Range   WBC 12.5 (*) 4.0 - 10.5 K/uL   RBC 4.64  4.22 - 5.81 MIL/uL   Hemoglobin 14.1  13.0 - 17.0 g/dL   HCT 41.8  39.0 - 52.0 %   MCV 90.1  78.0 - 100.0 fL   MCH 30.4  26.0 - 34.0 pg   MCHC 33.7  30.0 - 36.0 g/dL   RDW 14.2  11.5 - 15.5 %   Platelets 213  150 - 400 K/uL   Neutrophils Relative % 83 (*) 43 - 77 %   Neutro Abs 10.3 (*) 1.7 - 7.7 K/uL   Lymphocytes Relative 9 (*) 12 - 46 %   Lymphs Abs 1.2  0.7 - 4.0 K/uL   Monocytes Relative 6  3 - 12 %   Monocytes Absolute 0.8  0.1 - 1.0 K/uL   Eosinophils Relative 2  0 - 5 %   Eosinophils Absolute 0.2  0.0 - 0.7 K/uL   Basophils Relative 0  0 - 1 %   Basophils Absolute 0.0  0.0 - 0.1 K/uL  COMPREHENSIVE METABOLIC PANEL      Result Value Ref Range   Sodium 140  137 - 147 mEq/L   Potassium  3.9  3.7 - 5.3 mEq/L   Chloride 103  96 - 112 mEq/L   CO2 24  19 - 32 mEq/L    Glucose, Bld 144 (*) 70 - 99 mg/dL   BUN 14  6 - 23 mg/dL   Creatinine, Ser 0.87  0.50 - 1.35 mg/dL   Calcium 9.2  8.4 - 10.5 mg/dL   Total Protein 6.8  6.0 - 8.3 g/dL   Albumin 3.3 (*) 3.5 - 5.2 g/dL   AST 52 (*) 0 - 37 U/L   ALT 31  0 - 53 U/L   Alkaline Phosphatase 88  39 - 117 U/L   Total Bilirubin 0.5  0.3 - 1.2 mg/dL   GFR calc non Af Amer 79 (*) >90 mL/min   GFR calc Af Amer >90  >90 mL/min  LIPASE, BLOOD      Result Value Ref Range   Lipase 36  11 - 59 U/L  URINALYSIS, ROUTINE W REFLEX MICROSCOPIC      Result Value Ref Range   Color, Urine YELLOW  YELLOW   APPearance CLEAR  CLEAR   Specific Gravity, Urine 1.017  1.005 - 1.030   pH 6.0  5.0 - 8.0   Glucose, UA NEGATIVE  NEGATIVE mg/dL   Hgb urine dipstick NEGATIVE  NEGATIVE   Bilirubin Urine NEGATIVE  NEGATIVE   Ketones, ur NEGATIVE  NEGATIVE mg/dL   Protein, ur NEGATIVE  NEGATIVE mg/dL   Urobilinogen, UA 0.2  0.0 - 1.0 mg/dL   Nitrite NEGATIVE  NEGATIVE   Leukocytes, UA NEGATIVE  NEGATIVE  I-STAT TROPOININ, ED      Result Value Ref Range   Troponin i, poc 0.00  0.00 - 0.08 ng/mL   Comment 3             Imaging Review Ct Abdomen Pelvis W Contrast  10/11/2013   CLINICAL DATA:  Diffuse abdominal pain with distention, also dyspnea  EXAM: CT ABDOMEN AND PELVIS WITH CONTRAST  TECHNIQUE: Multidetector CT imaging of the abdomen and pelvis was performed using the standard protocol following bolus administration of intravenous contrast.  CONTRAST:  119m OMNIPAQUE IOHEXOL 300 MG/ML SOLN intravenously ; the patient also received oral contrast material  COMPARISON:  None.  FINDINGS: The stomach is nondistended. There is contrast within the normal calibered small bowel loops. Contrast has not yet reached the colon. The stool burden within the colon is within the limits of normal. There are scattered diverticula within the right and left colon, and there are exuberant diverticula within the sigmoid colon. There is no objective  evidence of acute diverticulitis. There are bilateral fat-containing inguinal hernias. There is no umbilical hernia. A normal appendix is demonstrated on images 58-68.  The liver demonstrates a lobulated cystic appearing mass in the left lobe which measures 4.3 cm AP x 4.2 cm transversely. It exhibits Hounsfield measurement of -4. There are scattered subcentimeter hypodensities within the liver consistent with cysts. The gallbladder exhibits no evidence of stones or acute inflammatory change. The spleen, pancreas, and adrenal glands are normal in appearance. The caliber of the abdominal aorta is normal. There is no periaortic or pericaval lymphadenopathy. The psoas musculature is normal in appearance.  The right kidney exhibits an exophytic lower pole hypodensity measuring the 8.4 cm in diameter with HU measurement of +11 consistent with a simple cyst. A right lateral lower pole exophytic hypodensity measures 2.9 cm and exhibits HU measurement of +15 also compatible with a simple cyst. There are other tiny cortical  hypodensities most compatible with cysts. On the left no dominant cyst is demonstrated. A 1 cm diameter hypodensity in the midpole has HU measurement of +7. The perinephric fat is normal in appearance. There are no calcified stones.  The partially distended urinary bladder is normal in appearance. There is no evidence of ascites. The lumbar vertebral bodies are preserved in height. The bony pelvis exhibits no acute abnormality. The lung bases exhibit mild emphysematous change and compressive atelectasis posteriorly. A punctate calcification in the right posterior costophrenic gutter is consistent with previous granulomatous infection.  IMPRESSION: 1. There is no evidence of bowel obstruction or ileus. There is no evidence of acute appendicitis. There is colonic diverticulosis without evidence of acute diverticulitis. 2. There are multiple hypodensities associated with the right kidney most compatible with  cysts with the largest measuring 8.4 cm in diameter. The Hounsfield measurements of these presumed cysts on the immediate postcontrast and delayed images are consistent with benign process ease. 3. There are cysts within the liver. There is no evidence of acute hepatobiliary abnormality. 4. There are bilateral fat containing inguinal hernias. 5. There are emphysematous changes at the lung bases with compressive atelectasis and evidence of previous granulomatous infection.   Electronically Signed   By: David  [place]   On: 10/11/2013 13:29    EKG Interpretation    Date/Time:  Friday October 11 2013 08:53:52 EST Ventricular Rate:  89 PR Interval:  201 QRS Duration: 91 QT Interval:  367 QTC Calculation: 446 R Axis:   -22 Text Interpretation:  Sinus rhythm Multiple ventricular premature complexes Abnormal R-wave progression, early transition Inferior infarct, old Consider anterior infarct Confirmed by   MD, [place] ([id number]) on 10/11/2013 9:07:21 AM Also confirmed by Rogene Houston  MD, [place] ([id number])  on 10/11/2013 10:17:32 AM            MDM   Final diagnoses:  Abdominal pain   The patient since being here his had no further abdominal pain. The pain was severe person as he started being but now is completely gone. Workup negative other than a mild leukocytosis. CT scan without any significant acute abdominal findings. Patient's pain now was always more periumbilical never had epigastric pain. Was not concerned based on the location of the pain of this being cardiac in nature. Patient does have a ventral hernia following aortic aneurysm repair. Again CT scan without any acute findings. We'll discharge home and followup with his regular doctor to have a white blood cell count rechecked in 2-3 weeks. Patient completely asymptomatic feels fine. Patient does have a history of multiple myeloma.     Mervin Kung, MD 10/11/13 [phone]

## 2013-10-11 NOTE — ED Notes (Signed)
Pt returned from radiology.

## 2013-10-11 NOTE — Discharge Instructions (Signed)
Workup for the abdominal pain without any significant acute findings. There was some elevation in your white blood cell count. Having that followed up and rechecked in 2 weeks by your primary care Dr. would be wise. Return for any newer worse symptoms.

## 2013-10-28 ENCOUNTER — Ambulatory Visit (INDEPENDENT_AMBULATORY_CARE_PROVIDER_SITE_OTHER): Payer: Medicare Other | Admitting: Podiatry

## 2013-10-28 ENCOUNTER — Encounter: Payer: Self-pay | Admitting: Podiatry

## 2013-10-28 VITALS — BP 110/61 | HR 94 | Temp 97.0°F | Resp 16

## 2013-10-28 DIAGNOSIS — L97509 Non-pressure chronic ulcer of other part of unspecified foot with unspecified severity: Secondary | ICD-10-CM

## 2013-10-28 NOTE — Patient Instructions (Signed)
Attach cut out felt pad around skin ulcer on left great toe cover with Silvadene and gauze on a daily basis.

## 2013-10-29 NOTE — Progress Notes (Signed)
Patient ID: Lee [name], male   DOB: November 29, 1930, 80 y.o.   MRN: [id number]  Subjective: This patient presents for ongoing treatment for traumatic skin ulceration on the left hallux since 11/20/2012.   Objective: A. left dropfoot persist. Patient has a left dropfoot brace. The plantar left hallux has hyperkeratotic area in the medial plantar aspect that after debridement breakdown into a half circle dermal ulceration 5 x 1 mm with a red granular base. There is no erythema, edema or malodor noted.  Assessment: Chronic traumatic skin ulceration left hallux without any clinical sign of infection The wound on the left hallux has reduced in size since treatment was initiated, approximately 50%  Plan: The ulcer site is debrided packed with Silvadene and a piece of felt pad applied around the ulcer site. The patient and his wife will continue to apply a thick adhesive felt pad around ulcer site, apply Silvadene and gauze dressing. He will continue to wear his left dropfoot brace.

## 2013-11-18 ENCOUNTER — Ambulatory Visit (INDEPENDENT_AMBULATORY_CARE_PROVIDER_SITE_OTHER): Payer: Medicare Other | Admitting: Podiatry

## 2013-11-18 ENCOUNTER — Encounter: Payer: Self-pay | Admitting: Podiatry

## 2013-11-18 VITALS — BP 156/68 | HR 87 | Temp 97.1°F | Resp 19 | Ht 66.0 in | Wt 194.0 lb

## 2013-11-18 DIAGNOSIS — L97509 Non-pressure chronic ulcer of other part of unspecified foot with unspecified severity: Secondary | ICD-10-CM

## 2013-11-18 NOTE — Patient Instructions (Signed)
Referred to wound care center. If not able to schedule in 2 weeks return for followup [facility]. Otherwise continue with Silvadene and pressure off pad daily

## 2013-11-19 NOTE — Progress Notes (Signed)
Patient ID: Lee [name], male   DOB: 05/14/1931, 80 y.o.   MRN: [id number]  Subjective: This patient presents for ongoing care for traumatic skin ulcer on the left hallux under care since 06/05/2013  Objective: Dropfoot deformity left noted. Hyperkeratotic tissue surrounding a plantar ulcer on the left hallux measuring 5 x 2 mm. The base appears granular. There is no erythema, edema, drainage or malodor noted surrounding the ulcer site  Assessment: Traumatic skin ulcer left hallux Nonhealing/delayed healing left hallux ulcer Dropfoot deformity left hallux  Plan: The ulcer site was debrided, dressed with Silvadene and a protective foam pad applied around the ulcer site. Patient will continue wearing a dropfoot brace. He will continue daily local wound care which includes application Silvadene, pressure off pad around skin ulcer and gauze dressing.  I am referring the patient to the wound care center because of the delay in healing on the left hallux ulcer. Our office will schedule an appointment with the wound care center. I will see patient again if the wound care center will not see him in the next 2 weeks or if she contacts [place] prior to the transfer to the wound care center.

## 2013-11-29 ENCOUNTER — Ambulatory Visit (HOSPITAL_COMMUNITY)
Admission: RE | Admit: 2013-11-29 | Discharge: 2013-11-29 | Disposition: A | Discharge status: Home / Self Care | Payer: Medicare Other | Source: Ambulatory Visit | Attending: General Surgery | Admitting: General Surgery

## 2013-11-29 ENCOUNTER — Other Ambulatory Visit (HOSPITAL_COMMUNITY): Payer: Self-pay | Admitting: General Surgery

## 2013-11-29 ENCOUNTER — Encounter (HOSPITAL_BASED_OUTPATIENT_CLINIC_OR_DEPARTMENT_OTHER): Payer: Medicare Other | Attending: General Surgery

## 2013-11-29 DIAGNOSIS — IMO0002 Reserved for concepts with insufficient information to code with codable children: Secondary | ICD-10-CM | POA: Insufficient documentation

## 2013-11-29 DIAGNOSIS — M869 Osteomyelitis, unspecified: Secondary | ICD-10-CM

## 2013-11-29 DIAGNOSIS — L84 Corns and callosities: Secondary | ICD-10-CM | POA: Insufficient documentation

## 2013-11-29 DIAGNOSIS — Z79899 Other long term (current) drug therapy: Secondary | ICD-10-CM | POA: Insufficient documentation

## 2013-11-29 DIAGNOSIS — G5790 Unspecified mononeuropathy of unspecified lower limb: Secondary | ICD-10-CM | POA: Insufficient documentation

## 2013-11-29 DIAGNOSIS — Z7982 Long term (current) use of aspirin: Secondary | ICD-10-CM | POA: Insufficient documentation

## 2013-11-29 DIAGNOSIS — M24676 Ankylosis, unspecified foot: Secondary | ICD-10-CM | POA: Insufficient documentation

## 2013-11-29 DIAGNOSIS — I10 Essential (primary) hypertension: Secondary | ICD-10-CM | POA: Insufficient documentation

## 2013-11-29 DIAGNOSIS — L97509 Non-pressure chronic ulcer of other part of unspecified foot with unspecified severity: Secondary | ICD-10-CM | POA: Insufficient documentation

## 2013-11-29 DIAGNOSIS — M24673 Ankylosis, unspecified ankle: Secondary | ICD-10-CM | POA: Insufficient documentation
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
## 2013-12-20 ENCOUNTER — Encounter (HOSPITAL_BASED_OUTPATIENT_CLINIC_OR_DEPARTMENT_OTHER): Payer: Medicare Other | Attending: General Surgery

## 2013-12-20 DIAGNOSIS — G5790 Unspecified mononeuropathy of unspecified lower limb: Secondary | ICD-10-CM | POA: Insufficient documentation

## 2013-12-20 DIAGNOSIS — L84 Corns and callosities: Secondary | ICD-10-CM | POA: Insufficient documentation

## 2013-12-20 DIAGNOSIS — L97509 Non-pressure chronic ulcer of other part of unspecified foot with unspecified severity: Secondary | ICD-10-CM | POA: Insufficient documentation

## 2014-01-24 ENCOUNTER — Encounter (HOSPITAL_BASED_OUTPATIENT_CLINIC_OR_DEPARTMENT_OTHER): Payer: Medicare Other | Attending: General Surgery

## 2014-01-24 DIAGNOSIS — G5790 Unspecified mononeuropathy of unspecified lower limb: Secondary | ICD-10-CM | POA: Insufficient documentation

## 2014-01-24 DIAGNOSIS — L84 Corns and callosities: Secondary | ICD-10-CM | POA: Insufficient documentation

## 2014-01-24 DIAGNOSIS — L97509 Non-pressure chronic ulcer of other part of unspecified foot with unspecified severity: Secondary | ICD-10-CM | POA: Insufficient documentation

## 2014-02-07 ENCOUNTER — Other Ambulatory Visit (HOSPITAL_COMMUNITY): Payer: Self-pay | Admitting: General Surgery

## 2014-02-07 ENCOUNTER — Ambulatory Visit (HOSPITAL_COMMUNITY)
Admission: RE | Admit: 2014-02-07 | Discharge: 2014-02-07 | Disposition: A | Discharge status: Home / Self Care | Payer: Medicare Other | Source: Ambulatory Visit | Attending: General Surgery | Admitting: General Surgery

## 2014-02-07 DIAGNOSIS — M869 Osteomyelitis, unspecified: Secondary | ICD-10-CM

## 2014-02-07 DIAGNOSIS — L97509 Non-pressure chronic ulcer of other part of unspecified foot with unspecified severity: Secondary | ICD-10-CM | POA: Insufficient documentation

## 2014-02-25 ENCOUNTER — Encounter (HOSPITAL_BASED_OUTPATIENT_CLINIC_OR_DEPARTMENT_OTHER): Payer: Medicare Other | Attending: General Surgery

## 2014-02-25 ENCOUNTER — Other Ambulatory Visit (HOSPITAL_BASED_OUTPATIENT_CLINIC_OR_DEPARTMENT_OTHER): Payer: Self-pay | Admitting: General Surgery

## 2014-02-25 DIAGNOSIS — G589 Mononeuropathy, unspecified: Secondary | ICD-10-CM | POA: Diagnosis not present

## 2014-02-25 DIAGNOSIS — L84 Corns and callosities: Secondary | ICD-10-CM | POA: Insufficient documentation

## 2014-02-25 DIAGNOSIS — S81802A Unspecified open wound, left lower leg, initial encounter: Secondary | ICD-10-CM

## 2014-02-25 DIAGNOSIS — L97509 Non-pressure chronic ulcer of other part of unspecified foot with unspecified severity: Secondary | ICD-10-CM | POA: Diagnosis not present

## 2014-02-25 DIAGNOSIS — M79675 Pain in left toe(s): Secondary | ICD-10-CM

## 2014-03-04 ENCOUNTER — Ambulatory Visit (HOSPITAL_COMMUNITY)
Admission: RE | Admit: 2014-03-04 | Discharge: 2014-03-04 | Disposition: A | Discharge status: Home / Self Care | Payer: Medicare Other | Source: Ambulatory Visit | Attending: General Surgery | Admitting: General Surgery

## 2014-03-04 DIAGNOSIS — L97509 Non-pressure chronic ulcer of other part of unspecified foot with unspecified severity: Secondary | ICD-10-CM | POA: Insufficient documentation

## 2014-03-04 DIAGNOSIS — M79675 Pain in left toe(s): Secondary | ICD-10-CM

## 2014-03-04 DIAGNOSIS — S81802A Unspecified open wound, left lower leg, initial encounter: Secondary | ICD-10-CM

## 2014-03-04 MED ORDER — GADOBENATE DIMEGLUMINE 529 MG/ML IV SOLN
18.0000 mL | Freq: Once | INTRAVENOUS | Status: AC | PRN
  Administered 2014-03-04: 18 mL via INTRAVENOUS

## 2014-03-07 DIAGNOSIS — L97509 Non-pressure chronic ulcer of other part of unspecified foot with unspecified severity: Secondary | ICD-10-CM | POA: Diagnosis not present

## 2014-03-07 DIAGNOSIS — G589 Mononeuropathy, unspecified: Secondary | ICD-10-CM | POA: Diagnosis not present

## 2014-03-07 DIAGNOSIS — L84 Corns and callosities: Secondary | ICD-10-CM | POA: Diagnosis not present

## 2014-03-14 DIAGNOSIS — L97509 Non-pressure chronic ulcer of other part of unspecified foot with unspecified severity: Secondary | ICD-10-CM | POA: Diagnosis not present

## 2014-03-14 DIAGNOSIS — G589 Mononeuropathy, unspecified: Secondary | ICD-10-CM | POA: Diagnosis not present

## 2014-03-14 DIAGNOSIS — L84 Corns and callosities: Secondary | ICD-10-CM | POA: Diagnosis not present

## 2014-03-21 DIAGNOSIS — G589 Mononeuropathy, unspecified: Secondary | ICD-10-CM | POA: Diagnosis not present

## 2014-03-21 DIAGNOSIS — L97509 Non-pressure chronic ulcer of other part of unspecified foot with unspecified severity: Secondary | ICD-10-CM | POA: Diagnosis not present

## 2014-03-21 DIAGNOSIS — L84 Corns and callosities: Secondary | ICD-10-CM | POA: Diagnosis not present

## 2014-04-18 ENCOUNTER — Ambulatory Visit (INDEPENDENT_AMBULATORY_CARE_PROVIDER_SITE_OTHER): Payer: Medicare Other | Admitting: Internal Medicine

## 2014-04-18 ENCOUNTER — Encounter: Payer: Self-pay | Admitting: Internal Medicine

## 2014-04-18 VITALS — BP 138/62 | HR 90 | Ht 65.0 in | Wt 201.0 lb

## 2014-04-18 DIAGNOSIS — J438 Other emphysema: Secondary | ICD-10-CM

## 2014-04-18 DIAGNOSIS — J84112 Idiopathic pulmonary fibrosis: Secondary | ICD-10-CM

## 2014-04-18 DIAGNOSIS — K219 Gastro-esophageal reflux disease without esophagitis: Secondary | ICD-10-CM

## 2014-04-18 LAB — PULMONARY FUNCTION TEST
DL/VA % pred: 77 %
DL/VA: 3.3 ml/min/mmHg/L
DLCO UNC % PRED: 58 %
DLCO unc: 14.91 ml/min/mmHg
FEF 25-75 PRE: 1.63 L/s
FEF 25-75 Post: 1.96 L/sec
FEF2575-%Change-Post: 20 %
FEF2575-%PRED-POST: 135 %
FEF2575-%Pred-Pre: 112 %
FEV1-%Change-Post: 4 %
FEV1-%PRED-PRE: 103 %
FEV1-%Pred-Post: 108 %
FEV1-Post: 2.37 L
FEV1-Pre: 2.28 L
FEV1FVC-%Change-Post: 3 %
FEV1FVC-%Pred-Pre: 103 %
FEV6-%Change-Post: 0 %
FEV6-%PRED-POST: 107 %
FEV6-%Pred-Pre: 106 %
FEV6-POST: 3.11 L
FEV6-Pre: 3.08 L
FEV6FVC-%CHANGE-POST: 0 %
FEV6FVC-%Pred-Post: 108 %
FEV6FVC-%Pred-Pre: 108 %
FVC-%CHANGE-POST: 0 %
FVC-%PRED-PRE: 98 %
FVC-%Pred-Post: 98 %
FVC-Post: 3.11 L
FVC-Pre: 3.1 L
POST FEV1/FVC RATIO: 76 %
Post FEV6/FVC ratio: 100 %
Pre FEV1/FVC ratio: 74 %
Pre FEV6/FVC Ratio: 100 %
RV % pred: 165 %
RV: 3.99 L
TLC % pred: 129 %
TLC: 7.84 L

## 2014-04-18 NOTE — Patient Instructions (Addendum)
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 Continue the same  #Followup - 6 mmnths - walk test in office (not 65mwd) and office spirometry at followup
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
## 2014-04-18 NOTE — Progress Notes (Signed)
PFT done today. 

## 2014-04-19 NOTE — Assessment & Plan Note (Signed)
#  For emphysema - Continue spiriva 1 puff daily - take samples worth 2 months - Continue oxygen at night but yes use saline nasal spray and gel to prevent bleeding nose - ensure flu shot when available

## 2014-04-19 NOTE — Assessment & Plan Note (Signed)
Discussed therapies for IPF  Both drugs OFEV and Esbriet only slow down progression, 1 out of 6 patients  - this means extension in quality of life but no difference in symptoms    - OFEV  - twice daily, no titration,   - no need for sunscreen but high chance of diarrhea and need lomotil , slight increase in heart attack risk and theoretical increase in bleeding risk,   - need monthly blood work for 3 months and then every 6 months  - time to firse exacerbation possibly reduced in one trial but not in another - limited worldwide experience   - ESBRIET  - 3 pill three times daily, slow titration. Need to wean sunscreen, small chance of nausea and anorexia but no diarrhea, no heart attack risk, no bleeding risk,   - need monthly blood work for 6 months  - possible mortality benefit in pooled analysis - larger world wide experience  In balance , esbriet preferred drug for him due to his non healing ulcer and PVD. However, either drug side effect profile and inconvenience for him are not part of his value system. So, will not recommmend tit  > 50% of this > 25 min visit spent in face to face counseling (15 min visit converted to 25 min)

## 2014-04-19 NOTE — Assessment & Plan Note (Signed)
-#   acid reflux -  Continue Prilosec. Continue the same

## 2014-05-14 ENCOUNTER — Telehealth: Payer: Self-pay | Admitting: Internal Medicine

## 2014-05-14 MED ORDER — TIOTROPIUM BROMIDE MONOHYDRATE 18 MCG IN CAPS: 18.0000 ug | ORAL_CAPSULE | Freq: Every day | RESPIRATORY_TRACT | Status: DC

## 2014-05-14 NOTE — Telephone Encounter (Signed)
Pt given samples of spiriva. Nothing further needed

## 2014-07-29 ENCOUNTER — Other Ambulatory Visit: Payer: Self-pay | Admitting: Dermatology

## 2014-09-22 ENCOUNTER — Other Ambulatory Visit: Payer: Self-pay | Admitting: Internal Medicine

## 2014-09-22 DIAGNOSIS — R1310 Dysphagia, unspecified: Secondary | ICD-10-CM

## 2014-09-23 ENCOUNTER — Ambulatory Visit
Admission: RE | Admit: 2014-09-23 | Discharge: 2014-09-23 | Disposition: A | Discharge status: Home / Self Care | Payer: Self-pay | Source: Ambulatory Visit | Attending: Internal Medicine | Admitting: Internal Medicine

## 2014-09-23 DIAGNOSIS — R1310 Dysphagia, unspecified: Secondary | ICD-10-CM

## 2014-10-22 ENCOUNTER — Encounter: Payer: Self-pay | Admitting: Internal Medicine

## 2014-10-22 ENCOUNTER — Ambulatory Visit (INDEPENDENT_AMBULATORY_CARE_PROVIDER_SITE_OTHER): Payer: Medicare Other | Admitting: Internal Medicine

## 2014-10-22 VITALS — BP 130/64 | HR 88 | Ht 66.0 in | Wt 198.8 lb

## 2014-10-22 DIAGNOSIS — J84112 Idiopathic pulmonary fibrosis: Secondary | ICD-10-CM

## 2014-10-22 DIAGNOSIS — R05 Cough: Secondary | ICD-10-CM

## 2014-10-22 DIAGNOSIS — R053 Chronic cough: Secondary | ICD-10-CM

## 2014-10-22 DIAGNOSIS — R059 Cough, unspecified: Secondary | ICD-10-CM | POA: Insufficient documentation

## 2014-10-22 DIAGNOSIS — J438 Other emphysema: Secondary | ICD-10-CM

## 2014-10-22 NOTE — Patient Instructions (Addendum)
  Other emphysema  IPF (idiopathic pulmonary fibrosis)  Chronic cough    You have a combination combination of emphysema and pulmonary fibrosis; IPF disease Overall you are stable   #For emphysema - Continue spiriva 1 puff daily - take samples and ensure refill - Continue oxygen at night but yes use saline nasal spray and gel to prevent bleeding nose - glad uptodate with flu shot   #IPF - Continue Protonix  #CHronic cough  - this is due to above reasons + benazepril might be playing a role  - stop benazepril (LOTENSIN)  - start losartan 25mg  daily - further titration by [name] A, MD - if no improvement, will consider referral to cough study  #Followup - 18months or sooner if needed - walk test at followup
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
## 2015-02-26 ENCOUNTER — Ambulatory Visit (INDEPENDENT_AMBULATORY_CARE_PROVIDER_SITE_OTHER): Payer: Medicare Other | Admitting: Internal Medicine

## 2015-02-26 ENCOUNTER — Encounter: Payer: Self-pay | Admitting: Internal Medicine

## 2015-02-26 VITALS — BP 110/58 | HR 84 | Ht 66.0 in | Wt 194.2 lb

## 2015-02-26 DIAGNOSIS — J438 Other emphysema: Secondary | ICD-10-CM | POA: Diagnosis not present

## 2015-02-26 DIAGNOSIS — R053 Chronic cough: Secondary | ICD-10-CM

## 2015-02-26 DIAGNOSIS — R05 Cough: Secondary | ICD-10-CM

## 2015-02-26 DIAGNOSIS — J84112 Idiopathic pulmonary fibrosis: Secondary | ICD-10-CM
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
## 2015-02-26 NOTE — Patient Instructions (Addendum)
ICD-9-CM ICD-10-CM   1. Other emphysema 492.8 J43.8   2. IPF (idiopathic pulmonary fibrosis) 516.31 J84.112   3. Chronic cough 786.2 R05     You have a combination combination of emphysema and pulmonary fibrosis; IPF disease Overall you are stable   #For emphysema - Rrespect  desire not to take any inhalers  #IPF - Continue Protonix - Respect desire to stay away from IPF specific therapy as discussed    #CHronic cough  - this is due to above reasons + benazepril might be playing a role  - last visit I advised to  stop benazepril (LOTENSIN) and startt losartan 25mg  daily  But I still see you on this lotension  - please discuss this with DR Jerlyn Ly, MD  #Followup - 9 months or sooner if needed - walk test at followup

## 2015-03-02 ENCOUNTER — Ambulatory Visit: Payer: Medicare Other | Admitting: Internal Medicine

## 2015-04-23 DIAGNOSIS — I429 Cardiomyopathy, unspecified: Secondary | ICD-10-CM

## 2015-04-23 HISTORY — PX: TRANSTHORACIC ECHOCARDIOGRAM: SHX275

## 2015-04-23 HISTORY — DX: Cardiomyopathy, unspecified: I42.9

## 2015-04-30 ENCOUNTER — Encounter (HOSPITAL_COMMUNITY): Payer: Self-pay | Admitting: Internal Medicine

## 2015-04-30 ENCOUNTER — Inpatient Hospital Stay (HOSPITAL_COMMUNITY)
Admission: EM | Admit: 2015-04-30 | Discharge: 2015-05-05 | DRG: 853 | Disposition: A | Discharge status: Home Health | Payer: Medicare Other | Attending: Internal Medicine | Admitting: Internal Medicine

## 2015-04-30 ENCOUNTER — Emergency Department (HOSPITAL_COMMUNITY): Payer: Medicare Other

## 2015-04-30 DIAGNOSIS — I519 Heart disease, unspecified: Secondary | ICD-10-CM | POA: Insufficient documentation

## 2015-04-30 DIAGNOSIS — A419 Sepsis, unspecified organism: Principal | ICD-10-CM | POA: Diagnosis present

## 2015-04-30 DIAGNOSIS — K439 Ventral hernia without obstruction or gangrene: Secondary | ICD-10-CM | POA: Diagnosis present

## 2015-04-30 DIAGNOSIS — Z823 Family history of stroke: Secondary | ICD-10-CM | POA: Diagnosis not present

## 2015-04-30 DIAGNOSIS — R7989 Other specified abnormal findings of blood chemistry: Secondary | ICD-10-CM

## 2015-04-30 DIAGNOSIS — Z9981 Dependence on supplemental oxygen: Secondary | ICD-10-CM | POA: Diagnosis not present

## 2015-04-30 DIAGNOSIS — R109 Unspecified abdominal pain: Secondary | ICD-10-CM | POA: Diagnosis present

## 2015-04-30 DIAGNOSIS — J84112 Idiopathic pulmonary fibrosis: Secondary | ICD-10-CM | POA: Diagnosis present

## 2015-04-30 DIAGNOSIS — J9601 Acute respiratory failure with hypoxia: Secondary | ICD-10-CM | POA: Diagnosis present

## 2015-04-30 DIAGNOSIS — R740 Nonspecific elevation of levels of transaminase and lactic acid dehydrogenase [LDH]: Secondary | ICD-10-CM

## 2015-04-30 DIAGNOSIS — R0600 Dyspnea, unspecified: Secondary | ICD-10-CM | POA: Diagnosis not present

## 2015-04-30 DIAGNOSIS — Z87891 Personal history of nicotine dependence: Secondary | ICD-10-CM | POA: Diagnosis not present

## 2015-04-30 DIAGNOSIS — R7401 Elevation of levels of liver transaminase levels: Secondary | ICD-10-CM

## 2015-04-30 DIAGNOSIS — I2489 Other forms of acute ischemic heart disease: Secondary | ICD-10-CM

## 2015-04-30 DIAGNOSIS — K810 Acute cholecystitis: Secondary | ICD-10-CM | POA: Insufficient documentation

## 2015-04-30 DIAGNOSIS — E871 Hypo-osmolality and hyponatremia: Secondary | ICD-10-CM | POA: Diagnosis present

## 2015-04-30 DIAGNOSIS — E872 Acidosis: Secondary | ICD-10-CM | POA: Diagnosis present

## 2015-04-30 DIAGNOSIS — R062 Wheezing: Secondary | ICD-10-CM

## 2015-04-30 DIAGNOSIS — Z8673 Personal history of transient ischemic attack (TIA), and cerebral infarction without residual deficits: Secondary | ICD-10-CM | POA: Diagnosis not present

## 2015-04-30 DIAGNOSIS — Z7902 Long term (current) use of antithrombotics/antiplatelets: Secondary | ICD-10-CM

## 2015-04-30 DIAGNOSIS — Z79899 Other long term (current) drug therapy: Secondary | ICD-10-CM | POA: Diagnosis not present

## 2015-04-30 DIAGNOSIS — I5021 Acute systolic (congestive) heart failure: Secondary | ICD-10-CM | POA: Diagnosis present

## 2015-04-30 DIAGNOSIS — J189 Pneumonia, unspecified organism: Secondary | ICD-10-CM | POA: Diagnosis present

## 2015-04-30 DIAGNOSIS — B9620 Unspecified Escherichia coli [E. coli] as the cause of diseases classified elsewhere: Secondary | ICD-10-CM | POA: Diagnosis present

## 2015-04-30 DIAGNOSIS — Z881 Allergy status to other antibiotic agents status: Secondary | ICD-10-CM | POA: Diagnosis not present

## 2015-04-30 DIAGNOSIS — Z66 Do not resuscitate: Secondary | ICD-10-CM | POA: Diagnosis present

## 2015-04-30 DIAGNOSIS — I248 Other forms of acute ischemic heart disease: Secondary | ICD-10-CM

## 2015-04-30 DIAGNOSIS — E860 Dehydration: Secondary | ICD-10-CM | POA: Diagnosis present

## 2015-04-30 DIAGNOSIS — I10 Essential (primary) hypertension: Secondary | ICD-10-CM | POA: Diagnosis present

## 2015-04-30 DIAGNOSIS — E785 Hyperlipidemia, unspecified: Secondary | ICD-10-CM | POA: Diagnosis present

## 2015-04-30 DIAGNOSIS — J441 Chronic obstructive pulmonary disease with (acute) exacerbation: Secondary | ICD-10-CM | POA: Diagnosis present

## 2015-04-30 DIAGNOSIS — I428 Other cardiomyopathies: Secondary | ICD-10-CM

## 2015-04-30 DIAGNOSIS — Z8249 Family history of ischemic heart disease and other diseases of the circulatory system: Secondary | ICD-10-CM | POA: Diagnosis not present

## 2015-04-30 DIAGNOSIS — I739 Peripheral vascular disease, unspecified: Secondary | ICD-10-CM | POA: Diagnosis present

## 2015-04-30 DIAGNOSIS — F0390 Unspecified dementia without behavioral disturbance: Secondary | ICD-10-CM | POA: Diagnosis present

## 2015-04-30 DIAGNOSIS — N390 Urinary tract infection, site not specified: Secondary | ICD-10-CM | POA: Diagnosis present

## 2015-04-30 DIAGNOSIS — N179 Acute kidney failure, unspecified: Secondary | ICD-10-CM | POA: Diagnosis present

## 2015-04-30 DIAGNOSIS — K819 Cholecystitis, unspecified: Secondary | ICD-10-CM

## 2015-04-30 DIAGNOSIS — R778 Other specified abnormalities of plasma proteins: Secondary | ICD-10-CM

## 2015-04-30 LAB — COMPREHENSIVE METABOLIC PANEL
ALT: 42 U/L (ref 17–63)
ANION GAP: 12 (ref 5–15)
AST: 46 U/L — ABNORMAL HIGH (ref 15–41)
Albumin: 3.2 g/dL — ABNORMAL LOW (ref 3.5–5.0)
Alkaline Phosphatase: 68 U/L (ref 38–126)
BUN: 16 mg/dL (ref 6–20)
CHLORIDE: 96 mmol/L — AB (ref 101–111)
CO2: 19 mmol/L — AB (ref 22–32)
Calcium: 8.8 mg/dL — ABNORMAL LOW (ref 8.9–10.3)
Creatinine, Ser: 1.28 mg/dL — ABNORMAL HIGH (ref 0.61–1.24)
GFR, EST AFRICAN AMERICAN: 58 mL/min — AB (ref >60)
GFR, EST NON AFRICAN AMERICAN: 50 mL/min — AB (ref >60)
Glucose, Bld: 140 mg/dL — ABNORMAL HIGH (ref 65–99)
POTASSIUM: 3.6 mmol/L (ref 3.5–5.1)
SODIUM: 127 mmol/L — AB (ref 135–145)
Total Bilirubin: 1.4 mg/dL — ABNORMAL HIGH (ref 0.3–1.2)
Total Protein: 7.2 g/dL (ref 6.5–8.1)

## 2015-04-30 LAB — URINALYSIS, ROUTINE W REFLEX MICROSCOPIC
Glucose, UA: NEGATIVE mg/dL
Hgb urine dipstick: NEGATIVE
KETONES UR: 15 mg/dL — AB
NITRITE: NEGATIVE
PH: 5.5 (ref 5.0–8.0)
Protein, ur: NEGATIVE mg/dL
SPECIFIC GRAVITY, URINE: 1.021 (ref 1.005–1.030)
UROBILINOGEN UA: 1 mg/dL (ref 0.0–1.0)

## 2015-04-30 LAB — URINE MICROSCOPIC-ADD ON

## 2015-04-30 LAB — I-STAT ARTERIAL BLOOD GAS, ED
Acid-base deficit: 6 mmol/L — ABNORMAL HIGH (ref 0.0–2.0)
BICARBONATE: 17.8 meq/L — AB (ref 20.0–24.0)
O2 SAT: 90 %
PCO2 ART: 29.6 mmHg — AB (ref 35.0–45.0)
PH ART: 7.387 (ref 7.350–7.450)
PO2 ART: 59 mmHg — AB (ref 80.0–100.0)
Patient temperature: 98.6
TCO2: 19 mmol/L (ref 0–100)

## 2015-04-30 LAB — CBC WITH DIFFERENTIAL/PLATELET
BASOS PCT: 0 % (ref 0–1)
Basophils Absolute: 0 10*3/uL (ref 0.0–0.1)
EOS PCT: 0 % (ref 0–5)
Eosinophils Absolute: 0 10*3/uL (ref 0.0–0.7)
HCT: 41.1 % (ref 39.0–52.0)
Hemoglobin: 14.5 g/dL (ref 13.0–17.0)
LYMPHS PCT: 6 % — AB (ref 12–46)
Lymphs Abs: 1.6 10*3/uL (ref 0.7–4.0)
MCH: 31.3 pg (ref 26.0–34.0)
MCHC: 35.3 g/dL (ref 30.0–36.0)
MCV: 88.6 fL (ref 78.0–100.0)
MONO ABS: 2.6 10*3/uL — AB (ref 0.1–1.0)
Monocytes Relative: 10 % (ref 3–12)
NEUTROS PCT: 84 % — AB (ref 43–77)
Neutro Abs: 22.2 10*3/uL — ABNORMAL HIGH (ref 1.7–7.7)
Platelets: 242 10*3/uL (ref 150–400)
RBC: 4.64 MIL/uL (ref 4.22–5.81)
RDW: 13.2 % (ref 11.5–15.5)
WBC: 26.4 10*3/uL — AB (ref 4.0–10.5)

## 2015-04-30 LAB — I-STAT CG4 LACTIC ACID, ED: LACTIC ACID, VENOUS: 1.45 mmol/L (ref 0.5–2.0)

## 2015-04-30 MED ORDER — AMLODIPINE BESYLATE 5 MG PO TABS: 5.0000 mg | ORAL_TABLET | Freq: Every day | ORAL | Status: DC

## 2015-04-30 MED ORDER — EZETIMIBE 10 MG PO TABS
5.0000 mg | ORAL_TABLET | Freq: Every day | ORAL | Status: DC
  Administered 2015-05-01 – 2015-05-05 (×5): 5 mg via ORAL
  Filled 2015-04-30 (×5): qty 1

## 2015-04-30 MED ORDER — CLOPIDOGREL BISULFATE 75 MG PO TABS: 75.0000 mg | ORAL_TABLET | Freq: Every day | ORAL | Status: DC

## 2015-04-30 MED ORDER — DICYCLOMINE HCL 20 MG PO TABS
20.0000 mg | ORAL_TABLET | Freq: Four times a day (QID) | ORAL | Status: DC | PRN
  Filled 2015-04-30: qty 1

## 2015-04-30 MED ORDER — IOHEXOL 300 MG/ML  SOLN
25.0000 mL | INTRAMUSCULAR | Status: DC
  Administered 2015-04-30: 25 mL via ORAL

## 2015-04-30 MED ORDER — ACETAMINOPHEN 325 MG PO TABS: 650.0000 mg | ORAL_TABLET | Freq: Four times a day (QID) | ORAL | Status: DC | PRN

## 2015-04-30 MED ORDER — PANTOPRAZOLE SODIUM 40 MG PO TBEC
40.0000 mg | DELAYED_RELEASE_TABLET | Freq: Every day | ORAL | Status: DC
  Administered 2015-05-01 – 2015-05-05 (×5): 40 mg via ORAL
  Filled 2015-04-30 (×6): qty 1

## 2015-04-30 MED ORDER — DEXTROSE 5 % IV SOLN
500.0000 mg | INTRAVENOUS | Status: DC
  Administered 2015-05-01: 500 mg via INTRAVENOUS
  Filled 2015-04-30 (×2): qty 500

## 2015-04-30 MED ORDER — PRAVASTATIN SODIUM 40 MG PO TABS
40.0000 mg | ORAL_TABLET | Freq: Every day | ORAL | Status: DC
  Administered 2015-05-01 – 2015-05-04 (×4): 40 mg via ORAL
  Filled 2015-04-30 (×4): qty 1

## 2015-04-30 MED ORDER — IPRATROPIUM-ALBUTEROL 0.5-2.5 (3) MG/3ML IN SOLN
3.0000 mL | Freq: Once | RESPIRATORY_TRACT | Status: AC
  Administered 2015-04-30: 3 mL via RESPIRATORY_TRACT
  Filled 2015-04-30: qty 3

## 2015-04-30 MED ORDER — DEXTROSE 5 % IV SOLN
500.0000 mg | Freq: Once | INTRAVENOUS | Status: AC
  Administered 2015-04-30: 500 mg via INTRAVENOUS
  Filled 2015-04-30: qty 500

## 2015-04-30 MED ORDER — PIPERACILLIN-TAZOBACTAM 3.375 G IVPB 30 MIN: 3.3750 g | Freq: Once | INTRAVENOUS | Status: DC

## 2015-04-30 MED ORDER — ENOXAPARIN SODIUM 40 MG/0.4ML ~~LOC~~ SOLN: 40.0000 mg | Freq: Every day | SUBCUTANEOUS | Status: DC

## 2015-04-30 MED ORDER — ONDANSETRON HCL 4 MG PO TABS
4.0000 mg | ORAL_TABLET | Freq: Four times a day (QID) | ORAL | Status: DC | PRN
Start: 2015-04-30 — End: 2015-05-05

## 2015-04-30 MED ORDER — NIACIN ER (ANTIHYPERLIPIDEMIC) 500 MG PO TBCR
500.0000 mg | EXTENDED_RELEASE_TABLET | Freq: Every day | ORAL | Status: DC
  Administered 2015-05-01 – 2015-05-04 (×4): 500 mg via ORAL
  Filled 2015-04-30 (×8): qty 1

## 2015-04-30 MED ORDER — ACETAMINOPHEN 650 MG RE SUPP: 650.0000 mg | Freq: Four times a day (QID) | RECTAL | Status: DC | PRN

## 2015-04-30 MED ORDER — ONDANSETRON HCL 4 MG/2ML IJ SOLN: 4.0000 mg | Freq: Four times a day (QID) | INTRAMUSCULAR | Status: DC | PRN

## 2015-04-30 MED ORDER — SODIUM CHLORIDE 0.9 % IV BOLUS (SEPSIS)
1000.0000 mL | INTRAVENOUS | Status: AC
  Administered 2015-04-30 (×3): 1000 mL via INTRAVENOUS

## 2015-04-30 MED ORDER — CEFTRIAXONE SODIUM 1 G IJ SOLR
1.0000 g | Freq: Once | INTRAMUSCULAR | Status: AC
  Administered 2015-04-30: 1 g via INTRAVENOUS
  Filled 2015-04-30: qty 10

## 2015-04-30 MED ORDER — SODIUM CHLORIDE 0.9 % IV SOLN
INTRAVENOUS | Status: DC
  Administered 2015-05-01: 02:00:00 via INTRAVENOUS

## 2015-04-30 MED ORDER — DEXTROSE 5 % IV SOLN: 1.0000 g | INTRAVENOUS | Status: DC

## 2015-04-30 MED ORDER — LOSARTAN POTASSIUM 50 MG PO TABS: 50.0000 mg | ORAL_TABLET | Freq: Every day | ORAL | Status: DC

## 2015-04-30 NOTE — H&P (Addendum)
[facility] History and Physical  Lee [name]:[id number] DOB: August 12, 1931 DOA: 04/30/2015  Referring physician: Dr. Ralene Bathe. PCP: Jerlyn Ly, MD  Specialists: Dr. Chase Caller. Pulmonologist.  Chief Complaint: Abnormal labs.  HPI: Lee [name] is a 81 y.o. male with history of interstitial lung disease, abdominal aortic aneurysm status post repair, hyperlipidemia, hypertension, left foot drop was advised to come to the ER after patient's blood work showed leukocytosis. Patient is ago was at [facility] when patient developed abdominal pain and over that patient had CT abdomen and pelvis without contrast which can be traced in care everywhere was unremarkable. As per the family patient's lipase is mildly elevated and was advised to follow-up. Patient had gone to his PCP 2 days ago and had blood work which showed leukocytosis and patient was advised to come to the ER. Lab works done in the ER shows WBC count around 26,000 and patient was still complaining of abdominal pain which is mostly in the epigastric area radiating to the back. As per patient's wife patient has significant pain yesterday and patient has largely stayed on the recliner. Denies any nausea vomiting or diarrhea. Since today morning patient has been having increasing shortness of breath with productive cough. Chest x-ray shows infiltrate concerning for pneumonia. Blood cultures has been obtained and patient has been admitted for sepsis and further workup for abdominal pain.  Review of Systems: As presented in the history of presenting illness, rest negative.  Past Medical History  Diagnosis Date  . Dyspnea   . HTN (hypertension)   . AAA (abdominal aortic aneurysm)   . Allergic rhinitis   . Erectile dysfunction   . Hyperlipidemia   . Obesity   . PVD (peripheral vascular disease)   . Basal cell carcinoma of face    Past Surgical History  Procedure Laterality Date  . Aorta surgery  2002  . Hernia  repair  1979  . Urinary tract stretch      late 77s   Social History:  reports that he quit smoking about 40 years ago. His smoking use included Cigarettes. He has a 25 pack-year smoking history. He has never used smokeless tobacco. He reports that he does not drink alcohol or use illicit drugs. Where does patient live home. Can patient participate in ADLs? Yes.  Allergies  Allergen Reactions  . Ciprofloxacin Other (See Comments)    Tingle feeling throughout body    Family History:  Family History  Problem Relation Age of Onset  . Hypertension Mother   . Colon cancer Brother   . Stroke Father   . Heart disease Father       Prior to Admission medications   Medication Sig Start Date End Date Taking? Authorizing Provider  amLODipine (NORVASC) 5 MG tablet Take 5 mg by mouth daily.   Yes Historical Provider, MD  clopidogrel (PLAVIX) 75 MG tablet Take 75 mg by mouth daily.  09/24/14  Yes Historical Provider, MD  dicyclomine (BENTYL) 20 MG tablet Take 20 mg by mouth every 6 (six) hours as needed for spasms.   Yes Historical Provider, MD  ezetimibe (ZETIA) 10 MG tablet Take 5 mg by mouth daily.    Yes Historical Provider, MD  losartan (COZAAR) 50 MG tablet Take 50 mg by mouth daily.   Yes Historical Provider, MD  niacin (NIASPAN) 500 MG CR tablet Take 500 mg by mouth at bedtime.   Yes Historical Provider, MD  pantoprazole (PROTONIX) 40 MG tablet Take 40 mg by mouth  daily. 09/25/14  Yes Historical Provider, MD  Pitavastatin Calcium (LIVALO) 2 MG TABS Take 2 mg by mouth See admin instructions. Every 3rd day   Yes Historical Provider, MD  tiotropium (SPIRIVA) 18 MCG inhalation capsule Place 1 capsule (18 mcg total) into inhaler and inhale daily. Patient not taking: Reported on 02/26/2015 05/14/14   Brand Males, MD    Physical Exam: Filed Vitals:   04/30/15 2200 04/30/15 2215 04/30/15 2230 04/30/15 2239  BP: 136/69 123/65 133/59   Pulse: 106 102 104   Temp:      TempSrc:      Resp: 35  32 28   Weight:    87.998 kg (194 lb)  SpO2: 99% 99% 95%      General:  Moderately built and nourished.  Eyes: Anicteric no pallor.  ENT: No discharge from the ears eyes nose or mouth.  Neck: No mass felt. No JVD appreciated.  Cardiovascular: S1 and S2 heard.  Respiratory: No rhonchi or crepitations.  Abdomen: Epigastric hernia. Mild tenderness in the epigastric area.  Skin: No rash.  Musculoskeletal: No edema.  Psychiatric: Appears normal.  Neurologic: Alert awake oriented to time place and person. Moves all extremities. Has chronic left lower S 20 weakness.  Labs on Admission:  Basic Metabolic Panel:  Recent Labs Lab 04/30/15 1928  NA 127*  K 3.6  CL 96*  CO2 19*  GLUCOSE 140*  BUN 16  CREATININE 1.28*  CALCIUM 8.8*   Liver Function Tests:  Recent Labs Lab 04/30/15 1928  AST 46*  ALT 42  ALKPHOS 68  BILITOT 1.4*  PROT 7.2  ALBUMIN 3.2*   No results for input(s): LIPASE, AMYLASE in the last 168 hours. No results for input(s): AMMONIA in the last 168 hours. CBC:  Recent Labs Lab 04/30/15 1928  WBC 26.4*  NEUTROABS 22.2*  HGB 14.5  HCT 41.1  MCV 88.6  PLT 242   Cardiac Enzymes: No results for input(s): CKTOTAL, CKMB, CKMBINDEX, TROPONINI in the last 168 hours.  BNP (last 3 results) No results for input(s): BNP in the last 8760 hours.  ProBNP (last 3 results) No results for input(s): PROBNP in the last 8760 hours.  CBG: No results for input(s): GLUCAP in the last 168 hours.  Radiological Exams on Admission: Dg Chest 2 View  04/30/2015   CLINICAL DATA:  Acute onset of fever up to 100.4 degrees F which began yesterday. Patient was found to have leukocytosis with [place] blood [place] 49[address] and was hypoxic at his primary provider's office yesterday.  EXAM: CHEST  2 VIEW  COMPARISON:  CT chest 09/17/2012.  Two-view chest x-ray 08/06/2012.  FINDINGS: AP semi-erect and lateral images were obtained. Cardiac silhouette normal in size,  unchanged. Thoracic aorta atherosclerotic and mildly tortuous, unchanged. Hilar and mediastinal contours otherwise unremarkable. Suboptimal inspiration with linear atelectasis in the left lower lobe, lingula and right middle lobe. Emphysematous changes in the upper lobes and mild to moderate central peribronchial thickening, unchanged. Dense consolidation in the lateral right lower lobe. No pleural effusions. Degenerative changes involving the thoracic and visualized upper lumbar spine. Old healed fracture involving the right clavicle.  IMPRESSION: 1. Dense consolidation in the lateral right lower lobe, likely a combination of acute atelectasis and pneumonia. 2. Suboptimal inspiration with linear atelectasis in the left lower lobe, lingula and right middle lobe.   Electronically Signed   By: Evangeline Dakin M.D.   On: 04/30/2015 20:09    EKG: Independently reviewed. Sinus tachycardia with  intraventricular conduction defect.  Assessment/Plan Principal Problem:   Sepsis Active Problems:   IPF (idiopathic pulmonary fibrosis)   Hypertension   Acute renal failure (ARF)   Abdominal pain   Acute respiratory failure with hypoxia   HLD (hyperlipidemia)   1. Sepsis most likely source could be pneumonia and possible intra-abdominal also - patient at this time has been placed on ceftriaxone and Zithromax for pneumonia community-acquired. Follow blood cultures. Patient has been placed on sepsis protocol. Follow procalcitonin levels lactic acid levels and continue hydration. With regarding to intra-abdominal also see #2. 2. Abdominal pain mostly in the epigastric area  - patient still has abdominal pain at this time CT abdomen and pelvis with by mouth contrast is pending. 3. Acute respiratory failure secondary to pneumonia with history of interstitial lung disease - closely observe in stepdown for any further worsening of respiratory status. Pneumonia has been treated with empiric antibiotics. See #1. Follow  ABG. 4. Metabolic acidosis - probably secondary to sepsis. Could also be compensation for respiratory failure. Follow ABG. 5. Acute renal failure probably from dehydration - continue with hydration and closely follow intake output and metabolic panel. 6. History of hypertension - continue present medications. 7. History of hyperlipidemia. 8. History of interstitial lung disease. 9. History of left lower extremity weakness.  Addendum - patient's CAT scan shows inflammation around the gallbladder concerning for cholecystitis. I have consulted Dr. Marlou Starks, on-call surgeon who will be seeing patient in consult. Dr. Marlou Starks has advised HIDA scan. Patient will be kept nothing by mouth except medications. Patient's troponin is mildly elevated probably from sepsis, we will cycle troponins and check 2-D echo. At this time I have broadened patient's antibiotics to vancomycin and Zosyn and Zithromax.   DVT ProphylaxiSCDs.   Code Status: full code. Family Communication:discussed with patient's wife and son.  Dispostion Plan: Admit to inpatient.     Lee [name] N. [facility] Pager [phone].  If 7PM-7AM, please contact night-coverage [email or web address] Password [email or web address] 04/30/2015, 10:59 PM

## 2015-04-30 NOTE — ED Notes (Signed)
MD at bedside. 

## 2015-04-30 NOTE — ED Notes (Signed)
Patient here with complaint of fever, hypoxia, and elevated WBC. Was seen for hernia in abdomen 4 days ago a [place] in [place] and discharged. PCP saw patient yesterday and did some routine lab work. They found a WBC count @ 14. Wife reports fever at home measured to 100.4 coupled with low O2 saturations. Patient normally wears O2 at night and when needed.

## 2015-04-30 NOTE — ED Notes (Signed)
CODE SEPSIS CALL @ 2035.

## 2015-04-30 NOTE — ED Notes (Signed)
Admitting MD at bedside.

## 2015-05-01 ENCOUNTER — Inpatient Hospital Stay (HOSPITAL_COMMUNITY): Payer: Medicare Other

## 2015-05-01 ENCOUNTER — Encounter (HOSPITAL_COMMUNITY): Payer: Self-pay

## 2015-05-01 DIAGNOSIS — R0600 Dyspnea, unspecified: Secondary | ICD-10-CM

## 2015-05-01 DIAGNOSIS — R7989 Other specified abnormal findings of blood chemistry: Secondary | ICD-10-CM

## 2015-05-01 DIAGNOSIS — I10 Essential (primary) hypertension: Secondary | ICD-10-CM

## 2015-05-01 DIAGNOSIS — K810 Acute cholecystitis: Secondary | ICD-10-CM | POA: Insufficient documentation

## 2015-05-01 DIAGNOSIS — N179 Acute kidney failure, unspecified: Secondary | ICD-10-CM

## 2015-05-01 DIAGNOSIS — R778 Other specified abnormalities of plasma proteins: Secondary | ICD-10-CM

## 2015-05-01 DIAGNOSIS — A419 Sepsis, unspecified organism: Principal | ICD-10-CM

## 2015-05-01 DIAGNOSIS — I519 Heart disease, unspecified: Secondary | ICD-10-CM

## 2015-05-01 LAB — CBC WITH DIFFERENTIAL/PLATELET
Basophils Absolute: 0 10*3/uL (ref 0.0–0.1)
Basophils Relative: 0 % (ref 0–1)
EOS ABS: 0 10*3/uL (ref 0.0–0.7)
Eosinophils Relative: 0 % (ref 0–5)
HEMATOCRIT: 38.7 % — AB (ref 39.0–52.0)
HEMOGLOBIN: 13.4 g/dL (ref 13.0–17.0)
LYMPHS ABS: 1.2 10*3/uL (ref 0.7–4.0)
LYMPHS PCT: 5 % — AB (ref 12–46)
MCH: 30.9 pg (ref 26.0–34.0)
MCHC: 34.6 g/dL (ref 30.0–36.0)
MCV: 89.4 fL (ref 78.0–100.0)
MONOS PCT: 9 % (ref 3–12)
Monocytes Absolute: 2 10*3/uL — ABNORMAL HIGH (ref 0.1–1.0)
NEUTROS PCT: 86 % — AB (ref 43–77)
Neutro Abs: 20.1 10*3/uL — ABNORMAL HIGH (ref 1.7–7.7)
Platelets: 217 10*3/uL (ref 150–400)
RBC: 4.33 MIL/uL (ref 4.22–5.81)
RDW: 13.4 % (ref 11.5–15.5)
WBC: 23.2 10*3/uL — ABNORMAL HIGH (ref 4.0–10.5)

## 2015-05-01 LAB — LACTIC ACID, PLASMA
LACTIC ACID, VENOUS: 1.9 mmol/L (ref 0.5–2.0)
LACTIC ACID, VENOUS: 2.6 mmol/L — AB (ref 0.5–2.0)
Lactic Acid, Venous: 1.2 mmol/L (ref 0.5–2.0)
Lactic Acid, Venous: 1.9 mmol/L (ref 0.5–2.0)

## 2015-05-01 LAB — POCT I-STAT 3, ART BLOOD GAS (G3+)
Acid-base deficit: 5 mmol/L — ABNORMAL HIGH (ref 0.0–2.0)
BICARBONATE: 17.8 meq/L — AB (ref 20.0–24.0)
O2 Saturation: 91 %
PCO2 ART: 26.7 mmHg — AB (ref 35.0–45.0)
Patient temperature: 98.2
TCO2: 19 mmol/L (ref 0–100)
pH, Arterial: 7.432 (ref 7.350–7.450)
pO2, Arterial: 56 mmHg — ABNORMAL LOW (ref 80.0–100.0)

## 2015-05-01 LAB — APTT: aPTT: 36 seconds (ref 24–37)

## 2015-05-01 LAB — COMPREHENSIVE METABOLIC PANEL
ALT: 34 U/L (ref 17–63)
AST: 39 U/L (ref 15–41)
Albumin: 2.7 g/dL — ABNORMAL LOW (ref 3.5–5.0)
Alkaline Phosphatase: 57 U/L (ref 38–126)
Anion gap: 8 (ref 5–15)
BUN: 10 mg/dL (ref 6–20)
CHLORIDE: 105 mmol/L (ref 101–111)
CO2: 21 mmol/L — AB (ref 22–32)
CREATININE: 0.87 mg/dL (ref 0.61–1.24)
Calcium: 8.2 mg/dL — ABNORMAL LOW (ref 8.9–10.3)
GFR calc non Af Amer: >60 mL/min (ref >60)
Glucose, Bld: 124 mg/dL — ABNORMAL HIGH (ref 65–99)
POTASSIUM: 3.3 mmol/L — AB (ref 3.5–5.1)
SODIUM: 134 mmol/L — AB (ref 135–145)
Total Bilirubin: 1.5 mg/dL — ABNORMAL HIGH (ref 0.3–1.2)
Total Protein: 5.9 g/dL — ABNORMAL LOW (ref 6.5–8.1)

## 2015-05-01 LAB — CBC
HEMATOCRIT: 40.2 % (ref 39.0–52.0)
Hemoglobin: 13.8 g/dL (ref 13.0–17.0)
MCH: 30.9 pg (ref 26.0–34.0)
MCHC: 34.3 g/dL (ref 30.0–36.0)
MCV: 90.1 fL (ref 78.0–100.0)
PLATELETS: 223 10*3/uL (ref 150–400)
RBC: 4.46 MIL/uL (ref 4.22–5.81)
RDW: 13.3 % (ref 11.5–15.5)
WBC: 22.7 10*3/uL — AB (ref 4.0–10.5)

## 2015-05-01 LAB — T4, FREE: FREE T4: 1.05 ng/dL (ref 0.61–1.12)

## 2015-05-01 LAB — TROPONIN I
Troponin I: 0.11 ng/mL — ABNORMAL HIGH (ref <0.031)
Troponin I: 0.07 ng/mL — ABNORMAL HIGH (ref <0.031)
Troponin I: 0.27 ng/mL — ABNORMAL HIGH (ref <0.031)
Troponin I: 0.12 ng/mL — ABNORMAL HIGH (ref <0.031)

## 2015-05-01 LAB — PROCALCITONIN: PROCALCITONIN: 0.6 ng/mL

## 2015-05-01 LAB — CREATININE, SERUM
Creatinine, Ser: 1.14 mg/dL (ref 0.61–1.24)
GFR, EST NON AFRICAN AMERICAN: 58 mL/min — AB (ref >60)

## 2015-05-01 LAB — HIV ANTIBODY (ROUTINE TESTING W REFLEX): HIV SCREEN 4TH GENERATION: NONREACTIVE

## 2015-05-01 LAB — TSH: TSH: 0.878 u[IU]/mL (ref 0.350–4.500)

## 2015-05-01 LAB — MRSA PCR SCREENING: MRSA by PCR: POSITIVE — AB

## 2015-05-01 LAB — BRAIN NATRIURETIC PEPTIDE: B Natriuretic Peptide: 240.3 pg/mL — ABNORMAL HIGH (ref 0.0–100.0)

## 2015-05-01 LAB — PROTIME-INR
INR: 1.31 (ref 0.00–1.49)
Prothrombin Time: 16.4 seconds — ABNORMAL HIGH (ref 11.6–15.2)

## 2015-05-01 LAB — LIPASE, BLOOD: LIPASE: 24 U/L (ref 22–51)

## 2015-05-01 LAB — STREP PNEUMONIAE URINARY ANTIGEN: Strep Pneumo Urinary Antigen: NEGATIVE

## 2015-05-01 MED ORDER — POTASSIUM CHLORIDE 10 MEQ/100ML IV SOLN: 10.0000 meq | INTRAVENOUS | Status: DC

## 2015-05-01 MED ORDER — INFLUENZA VAC SPLIT QUAD 0.5 ML IM SUSY
0.5000 mL | PREFILLED_SYRINGE | INTRAMUSCULAR | Status: DC
  Filled 2015-05-01: qty 0.5

## 2015-05-01 MED ORDER — LEVOFLOXACIN IN D5W 500 MG/100ML IV SOLN
500.0000 mg | Freq: Once | INTRAVENOUS | Status: AC
  Administered 2015-05-01: 500 mg via INTRAVENOUS
  Filled 2015-05-01: qty 100

## 2015-05-01 MED ORDER — LABETALOL HCL 5 MG/ML IV SOLN
5.0000 mg | INTRAVENOUS | Status: DC | PRN
  Administered 2015-05-01: 5 mg via INTRAVENOUS
  Filled 2015-05-01: qty 4

## 2015-05-01 MED ORDER — TECHNETIUM TC 99M MEBROFENIN IV KIT
5.0000 | PACK | Freq: Once | INTRAVENOUS | Status: DC | PRN
  Administered 2015-05-01: 5.2 via INTRAVENOUS
  Filled 2015-05-01: qty 6

## 2015-05-01 MED ORDER — PIPERACILLIN-TAZOBACTAM 3.375 G IVPB 30 MIN
3.3750 g | Freq: Once | INTRAVENOUS | Status: AC
  Administered 2015-05-01: 3.375 g via INTRAVENOUS
  Filled 2015-05-01: qty 50

## 2015-05-01 MED ORDER — LEVALBUTEROL HCL 1.25 MG/0.5ML IN NEBU
1.2500 mg | INHALATION_SOLUTION | Freq: Four times a day (QID) | RESPIRATORY_TRACT | Status: DC
  Administered 2015-05-01 (×3): 1.25 mg via RESPIRATORY_TRACT
  Filled 2015-05-01 (×3): qty 0.5

## 2015-05-01 MED ORDER — METHYLPREDNISOLONE SODIUM SUCC 125 MG IJ SOLR
60.0000 mg | Freq: Two times a day (BID) | INTRAMUSCULAR | Status: DC
  Administered 2015-05-01 – 2015-05-05 (×9): 60 mg via INTRAVENOUS
  Filled 2015-05-01 (×9): qty 2

## 2015-05-01 MED ORDER — CETYLPYRIDINIUM CHLORIDE 0.05 % MT LIQD
7.0000 mL | Freq: Two times a day (BID) | OROMUCOSAL | Status: DC
  Administered 2015-05-01 – 2015-05-05 (×7): 7 mL via OROMUCOSAL

## 2015-05-01 MED ORDER — IOHEXOL 300 MG/ML  SOLN
50.0000 mL | Freq: Once | INTRAMUSCULAR | Status: DC | PRN
  Administered 2015-05-01: 10 mL via INTRAVENOUS
  Filled 2015-05-01: qty 50

## 2015-05-01 MED ORDER — TIOTROPIUM BROMIDE MONOHYDRATE 18 MCG IN CAPS
18.0000 ug | ORAL_CAPSULE | Freq: Every day | RESPIRATORY_TRACT | Status: DC
  Administered 2015-05-02 – 2015-05-05 (×4): 18 ug via RESPIRATORY_TRACT
  Filled 2015-05-01: qty 5

## 2015-05-01 MED ORDER — METOPROLOL TARTRATE 12.5 MG HALF TABLET
12.5000 mg | ORAL_TABLET | Freq: Two times a day (BID) | ORAL | Status: DC
  Administered 2015-05-01 – 2015-05-04 (×7): 12.5 mg via ORAL
  Filled 2015-05-01 (×7): qty 1

## 2015-05-01 MED ORDER — MIDAZOLAM HCL 2 MG/2ML IJ SOLN
INTRAMUSCULAR | Status: AC
Start: 2015-05-01 — End: 2015-05-02
  Filled 2015-05-01: qty 4

## 2015-05-01 MED ORDER — LIDOCAINE HCL 1 % IJ SOLN
INTRAMUSCULAR | Status: AC
  Filled 2015-05-01: qty 20

## 2015-05-01 MED ORDER — SODIUM CHLORIDE 0.9 % IV SOLN
INTRAVENOUS | Status: DC
Start: 2015-05-02 — End: 2015-05-02
  Administered 2015-05-02: via INTRAVENOUS

## 2015-05-01 MED ORDER — PIPERACILLIN-TAZOBACTAM 3.375 G IVPB
3.3750 g | Freq: Three times a day (TID) | INTRAVENOUS | Status: DC
  Administered 2015-05-01 – 2015-05-05 (×12): 3.375 g via INTRAVENOUS
  Filled 2015-05-01 (×14): qty 50

## 2015-05-01 MED ORDER — POTASSIUM CHLORIDE CRYS ER 20 MEQ PO TBCR
40.0000 meq | EXTENDED_RELEASE_TABLET | Freq: Once | ORAL | Status: AC
  Administered 2015-05-01: 40 meq via ORAL
  Filled 2015-05-01: qty 2

## 2015-05-01 MED ORDER — LEVALBUTEROL HCL 0.63 MG/3ML IN NEBU: 0.6300 mg | INHALATION_SOLUTION | Freq: Three times a day (TID) | RESPIRATORY_TRACT | Status: DC | PRN

## 2015-05-01 MED ORDER — METOPROLOL TARTRATE 1 MG/ML IV SOLN: 5.0000 mg | Freq: Four times a day (QID) | INTRAVENOUS | Status: DC | PRN

## 2015-05-01 MED ORDER — FENTANYL CITRATE (PF) 100 MCG/2ML IJ SOLN
INTRAMUSCULAR | Status: AC | PRN
  Administered 2015-05-01 (×3): 25 ug via INTRAVENOUS

## 2015-05-01 MED ORDER — MORPHINE SULFATE (PF) 4 MG/ML IV SOLN
3.6000 mg | Freq: Once | INTRAVENOUS | Status: AC
  Administered 2015-05-01: 3.6 mg via INTRAVENOUS

## 2015-05-01 MED ORDER — LOSARTAN POTASSIUM 50 MG PO TABS
50.0000 mg | ORAL_TABLET | Freq: Every day | ORAL | Status: DC
  Administered 2015-05-01 – 2015-05-05 (×5): 50 mg via ORAL
  Filled 2015-05-01 (×5): qty 1

## 2015-05-01 MED ORDER — AMLODIPINE BESYLATE 5 MG PO TABS
5.0000 mg | ORAL_TABLET | Freq: Every day | ORAL | Status: DC
  Administered 2015-05-01 – 2015-05-04 (×4): 5 mg via ORAL
  Filled 2015-05-01 (×4): qty 1

## 2015-05-01 MED ORDER — MORPHINE SULFATE (PF) 4 MG/ML IV SOLN
INTRAVENOUS | Status: AC
  Administered 2015-05-01: 3.6 mg via INTRAVENOUS
  Filled 2015-05-01: qty 1

## 2015-05-01 MED ORDER — FENTANYL CITRATE (PF) 100 MCG/2ML IJ SOLN
INTRAMUSCULAR | Status: AC
  Filled 2015-05-01: qty 4

## 2015-05-01 MED ORDER — VANCOMYCIN HCL IN DEXTROSE 750-5 MG/150ML-% IV SOLN
750.0000 mg | Freq: Two times a day (BID) | INTRAVENOUS | Status: DC
  Administered 2015-05-01 – 2015-05-02 (×3): 750 mg via INTRAVENOUS
  Filled 2015-05-01 (×5): qty 150

## 2015-05-01 MED ORDER — MUPIROCIN 2 % EX OINT
1.0000 "application " | TOPICAL_OINTMENT | Freq: Two times a day (BID) | CUTANEOUS | Status: DC
  Administered 2015-05-01 – 2015-05-05 (×9): 1 via NASAL
  Filled 2015-05-01 (×2): qty 22

## 2015-05-01 MED ORDER — CHLORHEXIDINE GLUCONATE CLOTH 2 % EX PADS
6.0000 | MEDICATED_PAD | Freq: Every day | CUTANEOUS | Status: AC
Start: 2015-05-01 — End: 2015-05-05
  Administered 2015-05-01 – 2015-05-05 (×5): 6 via TOPICAL

## 2015-05-01 MED ORDER — SODIUM CHLORIDE 0.9 % IV BOLUS (SEPSIS)
250.0000 mL | Freq: Once | INTRAVENOUS | Status: AC
  Administered 2015-05-02: 250 mL via INTRAVENOUS

## 2015-05-01 MED ORDER — MIDAZOLAM HCL 2 MG/2ML IJ SOLN
INTRAMUSCULAR | Status: AC | PRN
  Administered 2015-05-01: 1 mg via INTRAVENOUS
  Administered 2015-05-01 (×2): 0.5 mg via INTRAVENOUS

## 2015-05-01 MED ORDER — ENOXAPARIN SODIUM 40 MG/0.4ML ~~LOC~~ SOLN: 40.0000 mg | Freq: Every day | SUBCUTANEOUS | Status: DC

## 2015-05-01 NOTE — Progress Notes (Signed)
Critical lactic acid 2.6. MD made aware. Order for bolus and continuing fluids. ABG results also given to MD. Advised to place on a venti mask. Will continue to monitor closely.

## 2015-05-01 NOTE — Progress Notes (Signed)
HIDA scan demonstrates acute cholecystitis. Please consult for interventional radiology to perform cholecystostomy tube placement. Have paged interventional radiologist to find out if this can be done today or if it will be deferred until tomorrow. Patient nothing by mouth until I hear back from radiology.

## 2015-05-01 NOTE — Care Management Note (Signed)
Case Management Note  Patient Details  Name: Lee [name] MRN: [id number] Date of Birth: Jan 15, 1931  Subjective/Objective:       Adm w sepsis             Action/Plan: lives w wife, pcp dr mark perini   Expected Discharge Date:                  Expected Discharge Plan:  [place]  In-House Referral:     Discharge planning Services     Post Acute Care Choice:    Choice offered to:     DME Arranged:    DME Agency:     HH Arranged:    [place] Agency:     Status of Service:     Medicare Important Message Given:    Date Medicare IM Given:    Medicare IM give by:    Date Additional Medicare IM Given:    Additional Medicare Important Message give by:     If discussed at [place] of Stay Meetings, dates discussed:    Additional Comments:ur review done  Lacretia Leigh, RN 05/01/2015, 8:40 AM

## 2015-05-01 NOTE — Sedation Documentation (Signed)
Patient denies pain and is resting comfortably.  

## 2015-05-01 NOTE — Consult Note (Signed)
Reason for Consult: + Trop  Requesting Physician: Short  HPI: Lee [name] is an 81 year old frail acutely ill-appearing married Caucasian male patient of Dr. Sherryl Barters who was admitted today with nausea and vomiting and abdominal pain. The symptoms have been going on for several days. He has a history of treated hypertension and hyperlipidemia. He had a nonischemic Myoview December 2013 and a normal 2-D echo. He had leukocytosis and apparently positive HIDA scan suggesting acute cholecystitis. A percutaneous drain was placed by interventional radiology. Lab work was notable for mildly elevated troponin of 0.27. A 2-D echocardiogram revealed a decline in LV function with an EF of 40-50% and anteroapical wall motion about a new since prior echo performed 3 years ago. The patient does have a history of idiopathic fibrosis followed by Dr. Chase Caller. He has had increasing shortness of breath recently which was being worked up.   Problem List: Patient Active Problem List   Diagnosis Date Noted  . Elevated troponin 05/01/2015  . Cholecystitis   . Sepsis 04/30/2015  . Hypertension 04/30/2015  . Acute renal failure (ARF) 04/30/2015  . Abdominal pain 04/30/2015  . Acute respiratory failure with hypoxia 04/30/2015  . HLD (hyperlipidemia) 04/30/2015  . CAP (community acquired pneumonia)   . IPF (idiopathic pulmonary fibrosis) 10/22/2014  . Chronic cough 10/22/2014  . GERD (gastroesophageal reflux disease) 02/05/2013  . Other emphysema 10/23/2012  . Idiopathic pulmonary fibrosis, high clinical pretest probability, declined surgical lung biopsy 09/13/2012  . Dyspnea on exertion 08/03/2012  . Hypercholesterolemia 08/03/2012  . Benign hypertensive heart disease without heart failure 08/03/2012  . H/O abdominal aortic aneurysm repair 08/03/2012  . Ventral hernia 08/03/2012    PMHx:  Past Medical History  Diagnosis Date  . Dyspnea   . HTN (hypertension)   . AAA (abdominal aortic aneurysm)    . Allergic rhinitis   . Erectile dysfunction   . Hyperlipidemia   . Obesity   . PVD (peripheral vascular disease)   . Basal cell carcinoma of face    Past Surgical History  Procedure Laterality Date  . Aorta surgery  2002  . Hernia repair  1979  . Urinary tract stretch      late 61s    FAMHx: Family History  Problem Relation Age of Onset  . Hypertension Mother   . Colon cancer Brother   . Stroke Father   . Heart disease Father     SOCHx:  reports that he quit smoking about 40 years ago. His smoking use included Cigarettes. He has a 25 pack-year smoking history. He has never used smokeless tobacco. He reports that he does not drink alcohol or use illicit drugs.  ALLERGIES: Allergies  Allergen Reactions  . Ciprofloxacin Other (See Comments)    Tingle feeling throughout body    ROS: A comprehensive review of systems was negative.  HOME MEDICATIONS: Prescriptions prior to admission  Medication Sig Dispense Refill Last Dose  . amLODipine (NORVASC) 5 MG tablet Take 5 mg by mouth daily.   04/30/2015 at Unknown time  . clopidogrel (PLAVIX) 75 MG tablet Take 75 mg by mouth daily.   6 04/30/2015 at Unknown time  . dicyclomine (BENTYL) 20 MG tablet Take 20 mg by mouth every 6 (six) hours as needed for spasms.   04/30/2015 at Unknown time  . ezetimibe (ZETIA) 10 MG tablet Take 5 mg by mouth daily.    04/30/2015 at Unknown time  . losartan (COZAAR) 50 MG tablet Take 50 mg by mouth daily.  04/30/2015 at Unknown time  . niacin (NIASPAN) 500 MG CR tablet Take 500 mg by mouth at bedtime.   04/29/2015 at Unknown time  . pantoprazole (PROTONIX) 40 MG tablet Take 40 mg by mouth daily.  11 04/30/2015 at Unknown time  . Pitavastatin Calcium (LIVALO) 2 MG TABS Take 2 mg by mouth See admin instructions. Every 3rd day   04/30/2015 at Unknown time  . tiotropium (SPIRIVA) 18 MCG inhalation capsule Place 1 capsule (18 mcg total) into inhaler and inhale daily. (Patient not taking: Reported on 02/26/2015) 30  capsule 0 Not Taking at Unknown time    HOSPITAL MEDICATIONS: I have reviewed the patient's current medications.  VITALS: Blood pressure 165/72, pulse 110, temperature 98.2 F (36.8 C), temperature source Oral, resp. rate 36, height 5\' 6"  (1.676 m), weight 199 lb 6.4 oz (90.447 kg), SpO2 93 %.  INPUT/OUTPUT I/O last 3 completed shifts: In: 3683.3 [I.V.:3683.3] Out: 100 [Urine:100] Total I/O In: 1600 [I.V.:1250; IV Piggyback:350] Out: 200 [Urine:200]    PHYSICAL EXAM: General appearance: alert, mild distress and slowed mentation Neck: no adenopathy, no carotid bruit, no JVD, supple, symmetrical, trachea midline and thyroid not enlarged, symmetric, no tenderness/mass/nodules Lungs: clear to auscultation bilaterally Heart: regular rate and rhythm, S1, S2 normal, no murmur, click, rub or gallop Abdomen: soft, absent bowel sounds Extremities: extremities normal, atraumatic, no cyanosis or edema  LABS:  BMP  Recent Labs  04/30/15 1928 05/01/15 0020 05/01/15 0615  NA 127*  --  134*  K 3.6  --  3.3*  CL 96*  --  105  CO2 19*  --  21*  GLUCOSE 140*  --  124*  BUN 16  --  10  CREATININE 1.28* 1.14 0.87  CALCIUM 8.8*  --  8.2*  GFRNONAA 50* 58* >60  GFRAA 58* >60 >60    CBC  Recent Labs Lab 05/01/15 0615  WBC 23.2*  RBC 4.33  HGB 13.4  HCT 38.7*  PLT 217  MCV 89.4    HEMOGLOBIN A1C No results found for: HGBA1C, MPG  Cardiac Panel (last 3 results)  Recent Labs  05/01/15 0020 05/01/15 0615 05/01/15 1433  TROPONINI 0.11* 0.07* 0.27*    BNP (last 3 results) No results for input(s): PROBNP in the last 8760 hours.  TSH  Recent Labs  05/01/15 1433  TSH 0.878    CHOLESTEROL No results for input(s): CHOL in the last 8760 hours.  Hepatic Function Panel  Recent Labs  04/30/15 1928 05/01/15 0615  PROT 7.2 5.9*  ALBUMIN 3.2* 2.7*  AST 46* 39  ALT 42 34  ALKPHOS 68 57  BILITOT 1.4* 1.5*    EKG- sinus tachycardia 117 with left bundle branch  block. This is new compared to prior EKGs. I personally reviewed this EKG  IMAGING: Ct Abdomen Pelvis Wo Contrast  05/01/2015   CLINICAL DATA:  81 year old male with right-sided abdominal pain. Concern for sepsis  EXAM: CT ABDOMEN AND PELVIS WITHOUT CONTRAST  TECHNIQUE: Multidetector CT imaging of the abdomen and pelvis was performed following the standard protocol without IV contrast.  COMPARISON:  CT dated 10/11/2013  FINDINGS: Evaluation of this exam is limited in the absence of intravenous contrast. Evaluation is also limited due to respiratory motion artifact.  There are emphysematous changes of the lungs. Patchy subsegmental right lung base densities may represent atelectatic changes. Pneumonia is less likely but not excluded. Clinical correlation is recommended. There is coronary vascular calcification. No intra-abdominal free air. Trace perihepatic free fluid.  A 4.5  cm grossly stable appearing hypodense lesion in the left lobe of the liver is not characterized on this study but characterized as a cyst on the prior study. There is stranding of the fat surrounding the gallbladder. There are areas of apparent thickening of the gallbladder wall. No calcified gallstone identified. Findings are concerning for an acute cholecystitis. Ultrasound is recommended for better evaluation of the gallbladder. The pancreas, spleen, and adrenal glands appear unremarkable. There are multiple right renal hypodense lesion with the largest measuring approximately 8 cm in the inferior pole of the right kidney. These lesions demonstrate fluid attenuation and likely represent cysts. Characterization is however limited on this noncontrast study. There is no hydronephrosis or nephrolithiasis on either side. The visualized ureters and urinary bladder appear unremarkable. The prostate gland is grossly unremarkable. Coarse calcification of the prostate noted.  There is extensive diverticulosis of the sigmoid colon as well as  scattered colonic diverticula without active inflammation. There is no evidence of bowel obstruction. Normal appendix.  There is aortoiliac atherosclerotic disease. 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 There is no lymphadenopathy. There is a 8 cm defect in the midline upper peritoneum with partial herniation of a segment of transverse colon. No evidence of obstruction or inflammation. This finding is new compared to the prior study. Midline vertical anterior abdominal wall incisional scar is noted. Anterior pelvic surgical scar is also noted. No fluid collection identified. There is degenerative changes of the spine. There is old healed fracture with chronic deformity of the left pubic bone and stable diastases of the symphysis pubis. No acute fracture identified. Stable L5 sclerotic focus likely represents an enostosis.  IMPRESSION: Apparent inflammatory changes of the fat surrounding the gallbladder concerning for acute cholecystitis. Ultrasound is recommended for better evaluation of the gallbladder.  Broad-based ventral hernia containing a short segment of the transverse colon without evidence of obstruction or inflammation.  Diverticulosis. No evidence of bowel obstruction or inflammation the   Electronically Signed   By: Anner Crete M.D.   On: 05/01/2015 01:55   Dg Chest 2 View  04/30/2015   CLINICAL DATA:  Acute onset of fever up to 100.4 degrees F which began yesterday. Patient was found to have leukocytosis with a white blood cell count of 24[address] and was hypoxic at his primary provider's office yesterday.  EXAM: CHEST  2 VIEW  COMPARISON:  CT chest 09/17/2012.  Two-view chest x-ray 08/06/2012.  FINDINGS: AP semi-erect and lateral images were obtained. Cardiac silhouette normal in size, unchanged. Thoracic aorta atherosclerotic and mildly tortuous, unchanged. Hilar and mediastinal contours otherwise unremarkable. Suboptimal inspiration with linear  atelectasis in the left lower lobe, lingula and right middle lobe. Emphysematous changes in the upper lobes and mild to moderate central peribronchial thickening, unchanged. Dense consolidation in the lateral right lower lobe. No pleural effusions. Degenerative changes involving the thoracic and visualized upper lumbar spine. Old healed fracture involving the right clavicle.  IMPRESSION: 1. Dense consolidation in the lateral right lower lobe, likely a combination of acute atelectasis and pneumonia. 2. Suboptimal inspiration with linear atelectasis in the left lower lobe, lingula and right middle lobe.   Electronically Signed   By: Evangeline Dakin M.D.   On: 04/30/2015 20:09   Nm Hepatobiliary Liver Func  05/01/2015   CLINICAL DATA:  Abdominal pain.  Abnormal CT.  EXAM: NUCLEAR MEDICINE HEPATOBILIARY IMAGING  TECHNIQUE: Sequential images of the abdomen were obtained out to 60 minutes following intravenous  administration of radiopharmaceutical. 3.6 mg of morphine administered IV  RADIOPHARMACEUTICALS:  5.2 MCi Tc-12m  Choletec IV  COMPARISON:  None.  FINDINGS: Liver, biliary system, bowel visualize normally. The gallbladder is not definitely identified. Morphine administered again without gallbladder visualization. These findings suggest cholecystitis. Gallbladder ultrasound should be considered for further evaluation.  IMPRESSION: Findings suggesting cholecystitis.   Electronically Signed   By: Marcello Moores  Register   On: 05/01/2015 13:26   Ir Perc Cholecystostomy  05/01/2015   INDICATION: 81 year old male with a history of acute cholecystitis  EXAM: ULTRASOUND AND FLUOROSCOPIC-GUIDED CHOLECYSTOSTOMY TUBE PLACEMENT  COMPARISON:  CT 05/01/2015  MEDICATIONS: Fentanyl 75 mcg IV; Versed 2.0 mg IV; 500 mg Levaquin  ANESTHESIA/SEDATION: Total Moderate Sedation Time  Twenty-five minutes  CONTRAST:  64mL OMNIPAQUE IOHEXOL 300 MG/ML  SOLN  FLUOROSCOPY TIME:  54 seconds, 0 minutes  COMPLICATIONS: None  PROCEDURE: Informed  written consent was obtained from the patient and the patient's family after a discussion of the risks, benefits and alternatives to treatment. Questions regarding the procedure were encouraged and answered. A timeout was performed prior to the initiation of the procedure.  The right upper abdominal quadrant was prepped and draped in the usual sterile fashion, and a sterile drape was applied covering the operative field. Maximum barrier sterile technique with sterile gowns and gloves were used for the procedure. A timeout was performed prior to the initiation of the procedure. Local anesthesia was provided with 1% lidocaine with epinephrine.  Ultrasound scanning of the right upper quadrant demonstrates a markedly dilated gallbladder. A transhepatic approach was attempted, however, given the superiorly positioned gallbladder, there is a concern for transgressing the diaphragm, and a rib space inferiorly was selected for puncture. This lower puncture may or may not represent a transhepatic approach. A 22 gauge needle was advanced into the gallbladder under direct ultrasound guidance. An ultrasound image was saved for documentation purposes. Appropriate intraluminal puncture was confirmed with the efflux of bile and advancement of an 0.018 wire into the gallbladder lumen. The needle was exchanged for an [place] set. A small amount of contrast was injected to confirm appropriate intraluminal positioning. Over a Benson wire, a 65.2-French Cook cholecystomy tube was advanced into the gallbladder fossa, coiled and locked. Bile was aspirated and a small amount of contrast was injected as several post procedural spot radiographic images were obtained in various obliquities. The catheter was secured to the skin with suture, connected to a drainage bag and a dressing was placed. The patient tolerated the procedure well without immediate post procedural complication.  IMPRESSION: Status post image guided percutaneous  cholecystostomy. Sample sent to the lab for analysis.  Signed,  Dulcy Fanny. Earleen Newport, DO  [facility]  [facility]   Electronically Signed   By: Corrie Mckusick D.O.   On: 05/01/2015 18:11    IMPRESSION: 1. Positive troponins: Mr. Seago troponins are mildly elevated at 0.27. I suspect this is related to demand ischemia from his acute illness. 2. Abnormal 2-D echo: Ejection fraction has declined to the moderate range (40-45%). There is a new wall motion abnormality in the anteroapical distribution. This suggests an ischemic component. 3.    RECOMMENDATION: 1. At this point I would not pursue an ischemic workup if the patient requires urgent cholecystectomy however, if the plan is a course of anti-biotics with percutaneous drainage and elective cholecystectomy at some point in the near future it would not be reasonable to perform a pharmacologic Myoview stress test to rule out myocardial  ischemia given his low-grade troponins and abnormal 2-D echocardiogram to risk stratify him. We will continue to follow him during his hospitalization and provide further input.  Time Spent Directly with Patient: 30 minutes  Quay Burow 05/01/2015, 6:47 PM

## 2015-05-01 NOTE — Progress Notes (Signed)
IR will likely be able to place cholecystostomy drain today. Please leave patient NPO.

## 2015-05-01 NOTE — H&P (Signed)
Chief Complaint: Patient was seen in consultation today for acute cholecystitis  Chief Complaint  Patient presents with  . Fever   at the request of Dr. Marlou Starks  Referring Physician(s): [facility]- Dr. Marlou Starks  History of Present Illness: Lee [name] is a 81 y.o. male who presents with c/o vague abdominal pain starting Monday while he and his wife were at the beach. The went to ED there and were told GB looked fine and all tests looked ok except slightly elevated lipase. They since presented to his PCP on Wednesday and found to have leukocytosis and fever of 100.2 last night. He then came to [facility] ED and imaging reveal possible pneumonia and CT and HIDA consistent with acute cholecystitis. He denies any current abdominal pain. He denies any nausea or vomiting. His wife states he has a lack of appetite and doesn't appear quite himself. He denies any chest pain, shortness of breath or palpitations. He denies any active signs of bleeding or excessive bruising. The patient does use chronic oxygen at 2L at night. He has previously tolerated sedation without complications.    Past Medical History  Diagnosis Date  . Dyspnea   . HTN (hypertension)   . AAA (abdominal aortic aneurysm)   . Allergic rhinitis   . Erectile dysfunction   . Hyperlipidemia   . Obesity   . PVD (peripheral vascular disease)   . Basal cell carcinoma of face     Past Surgical History  Procedure Laterality Date  . Aorta surgery  2002  . Hernia repair  1979  . Urinary tract stretch      late 70s    Allergies: Ciprofloxacin  Medications: Prior to Admission medications   Medication Sig Start Date End Date Taking? Authorizing Provider  amLODipine (NORVASC) 5 MG tablet Take 5 mg by mouth daily.   Yes Historical Provider, MD  clopidogrel (PLAVIX) 75 MG tablet Take 75 mg by mouth daily.  09/24/14  Yes Historical Provider, MD  dicyclomine (BENTYL) 20 MG tablet Take 20 mg by mouth every 6 (six) hours as needed for spasms.    Yes Historical Provider, MD  ezetimibe (ZETIA) 10 MG tablet Take 5 mg by mouth daily.    Yes Historical Provider, MD  losartan (COZAAR) 50 MG tablet Take 50 mg by mouth daily.   Yes Historical Provider, MD  niacin (NIASPAN) 500 MG CR tablet Take 500 mg by mouth at bedtime.   Yes Historical Provider, MD  pantoprazole (PROTONIX) 40 MG tablet Take 40 mg by mouth daily. 09/25/14  Yes Historical Provider, MD  Pitavastatin Calcium (LIVALO) 2 MG TABS Take 2 mg by mouth See admin instructions. Every 3rd day   Yes Historical Provider, MD  tiotropium (SPIRIVA) 18 MCG inhalation capsule Place 1 capsule (18 mcg total) into inhaler and inhale daily. Patient not taking: Reported on 02/26/2015 05/14/14   Brand Males, MD     Family History  Problem Relation Age of Onset  . Hypertension Mother   . Colon cancer Brother   . Stroke Father   . Heart disease Father     Social History   Social History  . Marital Status: Married    Spouse Name: N/A  . Number of Children: N/A  . Years of Education: N/A   Occupational History  . retired    Social History Main Topics  . Smoking status: Former Smoker -- 1.00 packs/day for 25 years    Types: Cigarettes    Quit date: 08/22/1974  .  Smokeless tobacco: Never Used  . Alcohol Use: No  . Drug Use: No  . Sexual Activity: Not Asked   Other Topics Concern  . None   Social History Narrative    Review of Systems: A 12 point ROS discussed and pertinent positives are indicated in the HPI above.  All other systems are negative.  Review of Systems  Vital Signs: BP 157/82 mmHg  Pulse 108  Temp(Src) 98.2 F (36.8 C) (Oral)  Resp 32  Ht 5\' 6"  (1.676 m)  Wt 199 lb 6.4 oz (90.447 kg)  BMI 32.20 kg/m2  SpO2 91%  Physical Exam  Constitutional: He is oriented to person, place, and time. No distress.  HENT:  Head: Normocephalic and atraumatic.  Cardiovascular: Regular rhythm.   Tachycardic  Pulmonary/Chest: Effort normal. He has wheezes.  Abdominal: He  exhibits distension.  Tenderness with deep palpation RUQ  Musculoskeletal: He exhibits no edema.  Neurological: He is alert and oriented to person, place, and time.  Skin: Skin is warm and dry. He is not diaphoretic.  Psychiatric: He has a normal mood and affect. His behavior is normal. Thought content normal.    Mallampati Score:  MD Evaluation Airway: WNL Heart: WNL Abdomen: WNL Chest/ Lungs: WNL ASA  Classification: 3 Mallampati/Airway Score: Two  Imaging: Ct Abdomen Pelvis Wo Contrast  05/01/2015   CLINICAL DATA:  81 year old male with right-sided abdominal pain. Concern for sepsis  EXAM: CT ABDOMEN AND PELVIS WITHOUT CONTRAST  TECHNIQUE: Multidetector CT imaging of the abdomen and pelvis was performed following the standard protocol without IV contrast.  COMPARISON:  CT dated 10/11/2013  FINDINGS: Evaluation of this exam is limited in the absence of intravenous contrast. Evaluation is also limited due to respiratory motion artifact.  There are emphysematous changes of the lungs. Patchy subsegmental right lung base densities may represent atelectatic changes. Pneumonia is less likely but not excluded. Clinical correlation is recommended. There is coronary vascular calcification. No intra-abdominal free air. Trace perihepatic free fluid.  A 4.5 cm grossly stable appearing hypodense lesion in the left lobe of the liver is not characterized on this study but characterized as a cyst on the prior study. There is stranding of the fat surrounding the gallbladder. There are areas of apparent thickening of the gallbladder wall. No calcified gallstone identified. Findings are concerning for an acute cholecystitis. Ultrasound is recommended for better evaluation of the gallbladder. The pancreas, spleen, and adrenal glands appear unremarkable. There are multiple right renal hypodense lesion with the largest measuring approximately 8 cm in the inferior pole of the right kidney. These lesions demonstrate  fluid attenuation and likely represent cysts. Characterization is however limited on this noncontrast study. There is no hydronephrosis or nephrolithiasis on either side. The visualized ureters and urinary bladder appear unremarkable. The prostate gland is grossly unremarkable. Coarse calcification of the prostate noted.  There is extensive diverticulosis of the sigmoid colon as well as scattered colonic diverticula without active inflammation. There is no evidence of bowel obstruction. Normal appendix.  There is aortoiliac atherosclerotic disease. 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 There is no lymphadenopathy. There is a 8 cm defect in the midline upper peritoneum with partial herniation of a segment of transverse colon. No evidence of obstruction or inflammation. This finding is new compared to the prior study. Midline vertical anterior abdominal wall incisional scar is noted. Anterior pelvic surgical scar is also noted. No fluid collection identified. There is degenerative changes  of the spine. There is old healed fracture with chronic deformity of the left pubic bone and stable diastases of the symphysis pubis. No acute fracture identified. Stable L5 sclerotic focus likely represents an enostosis.  IMPRESSION: Apparent inflammatory changes of the fat surrounding the gallbladder concerning for acute cholecystitis. Ultrasound is recommended for better evaluation of the gallbladder.  Broad-based ventral hernia containing a short segment of the transverse colon without evidence of obstruction or inflammation.  Diverticulosis. No evidence of bowel obstruction or inflammation the   Electronically Signed   By: Anner Crete M.D.   On: 05/01/2015 01:55   Dg Chest 2 View  04/30/2015   CLINICAL DATA:  Acute onset of fever up to 100.4 degrees F which began yesterday. Patient was found to have leukocytosis with a white blood cell count of [id number] and was hypoxic at  his primary provider's office yesterday.  EXAM: CHEST  2 VIEW  COMPARISON:  CT chest 09/17/2012.  Two-view chest x-ray 08/06/2012.  FINDINGS: AP semi-erect and lateral images were obtained. Cardiac silhouette normal in size, unchanged. Thoracic aorta atherosclerotic and mildly tortuous, unchanged. Hilar and mediastinal contours otherwise unremarkable. Suboptimal inspiration with linear atelectasis in the left lower lobe, lingula and right middle lobe. Emphysematous changes in the upper lobes and mild to moderate central peribronchial thickening, unchanged. Dense consolidation in the lateral right lower lobe. No pleural effusions. Degenerative changes involving the thoracic and visualized upper lumbar spine. Old healed fracture involving the right clavicle.  IMPRESSION: 1. Dense consolidation in the lateral right lower lobe, likely a combination of acute atelectasis and pneumonia. 2. Suboptimal inspiration with linear atelectasis in the left lower lobe, lingula and right middle lobe.   Electronically Signed   By: Evangeline Dakin M.D.   On: 04/30/2015 20:09   Nm Hepatobiliary Liver Func  05/01/2015   CLINICAL DATA:  Abdominal pain.  Abnormal CT.  EXAM: NUCLEAR MEDICINE HEPATOBILIARY IMAGING  TECHNIQUE: Sequential images of the abdomen were obtained out to 60 minutes following intravenous administration of radiopharmaceutical. 3.6 mg of morphine administered IV  RADIOPHARMACEUTICALS:  5.2 MCi Tc-57m  Choletec IV  COMPARISON:  None.  FINDINGS: Liver, biliary system, bowel visualize normally. The gallbladder is not definitely identified. Morphine administered again without gallbladder visualization. These findings suggest cholecystitis. Gallbladder ultrasound should be considered for further evaluation.  IMPRESSION: Findings suggesting cholecystitis.   Electronically Signed   By: Marcello Moores  Register   On: 05/01/2015 13:26    Labs:  CBC:  Recent Labs  04/30/15 1928 05/01/15 0020 05/01/15 0615  WBC 26.4* 22.7*  23.2*  HGB 14.5 13.8 13.4  HCT 41.1 40.2 38.7*  PLT 242 223 217    COAGS:  Recent Labs  05/01/15 0020  INR 1.31  APTT 36    BMP:  Recent Labs  04/30/15 1928 05/01/15 0020 05/01/15 0615  NA 127*  --  134*  K 3.6  --  3.3*  CL 96*  --  105  CO2 19*  --  21*  GLUCOSE 140*  --  124*  BUN 16  --  10  CALCIUM 8.8*  --  8.2*  CREATININE 1.28* 1.14 0.87  GFRNONAA 50* 58* >60  GFRAA 58* >60 >60    LIVER FUNCTION TESTS:  Recent Labs  04/30/15 1928 05/01/15 0615  BILITOT 1.4* 1.5*  AST 46* 39  ALT 42 34  ALKPHOS 68 57  PROT 7.2 5.9*  ALBUMIN 3.2* 2.7*    Assessment and Plan: Fever-sepsis with tachycardia  Elevated troponin in  setting of sepsis, trending down  Leukocytosis on IV Zosyn  Shortness of breath on 3L possible pneumonia  Diffuse abdominal pain starting 9/5 with decreased appetite  CT concerning for acute cholecystitis  HIDA consistent with acute cholecystitis  History of TIA per patient and on Plavix daily for this, last dose 9/8 at 12:00p The patient has been NPO, labs and vitals have been reviewed. [facility] has reviewed case and consulted IR for percutaneous cholecystostomy tube placement- Discussed with [facility] NP and given (+) HIDA would like to move forward with percutaneous cholecystostomy tube, bleeding risk deferred to Dr. Earleen Newport Risks and Benefits discussed with the patient and his family today including, but not limited to increased bleeding risk secondary to Plavix, infection, gallbladder perforation, bile leak or sepsis All of the patient's questions were answered, patient is agreeable to proceed. Consent signed and in chart. History of AAA s/p repair  HTN HLP   Thank you for this interesting consult.  I greatly enjoyed meeting Teigan H Sween and look forward to participating in their care.  A copy of this report was sent to the requesting provider on this date.  SignedHedy Jacob 05/01/2015, 2:54 PM   I spent a total of 40 Minutes in  face to face in clinical consultation, greater than 50% of which was counseling/coordinating care for acute cholecystitis.

## 2015-05-01 NOTE — ED Provider Notes (Signed)
CSN: [id number]     Arrival date & time 04/30/15  1847 History   First MD Initiated Contact with Patient 04/30/15 2002     Chief Complaint  Patient presents with  . Fever      The history is provided by the spouse. No language interpreter was used.   Mr. Volkov presents for evaluation of fever and shortness breath. Level V caveat due to confusion. History is primarily obtained from the wife. His wife reports that 4 days ago he developed some right-sided abdominal pain and was seen at [facility] to days ago for these symptoms. He has a CT scan of his abdomen that was negative and he was sent home on Bentyl. She reports that since that time he's had some right-sided abdominal pain and a temperature to 100.2 today. She reports increased work of breathing and increased shortness of breath. She noted his oxygen level was 98 home this morning and put him on supplemental oxygen. Symptoms are moderate, constant, worsening.  Past Medical History  Diagnosis Date  . Dyspnea   . HTN (hypertension)   . AAA (abdominal aortic aneurysm)   . Allergic rhinitis   . Erectile dysfunction   . Hyperlipidemia   . Obesity   . PVD (peripheral vascular disease)   . Basal cell carcinoma of face    Past Surgical History  Procedure Laterality Date  . Aorta surgery  2002  . Hernia repair  1979  . Urinary tract stretch      late 51s   Family History  Problem Relation Age of Onset  . Hypertension Mother   . Colon cancer Brother   . Stroke Father   . Heart disease Father    Social History  Substance Use Topics  . Smoking status: Former Smoker -- 1.00 packs/day for 25 years    Types: Cigarettes    Quit date: 08/22/1974  . Smokeless tobacco: Never Used  . Alcohol Use: No    Review of Systems  All other systems reviewed and are negative.      Allergies  Ciprofloxacin  Home Medications   Prior to Admission medications   Medication Sig Start Date End Date Taking? Authorizing Provider  amLODipine  (NORVASC) 5 MG tablet Take 5 mg by mouth daily.   Yes Historical Provider, MD  clopidogrel (PLAVIX) 75 MG tablet Take 75 mg by mouth daily.  09/24/14  Yes Historical Provider, MD  dicyclomine (BENTYL) 20 MG tablet Take 20 mg by mouth every 6 (six) hours as needed for spasms.   Yes Historical Provider, MD  ezetimibe (ZETIA) 10 MG tablet Take 5 mg by mouth daily.    Yes Historical Provider, MD  losartan (COZAAR) 50 MG tablet Take 50 mg by mouth daily.   Yes Historical Provider, MD  niacin (NIASPAN) 500 MG CR tablet Take 500 mg by mouth at bedtime.   Yes Historical Provider, MD  pantoprazole (PROTONIX) 40 MG tablet Take 40 mg by mouth daily. 09/25/14  Yes Historical Provider, MD  Pitavastatin Calcium (LIVALO) 2 MG TABS Take 2 mg by mouth See admin instructions. Every 3rd day   Yes Historical Provider, MD  tiotropium (SPIRIVA) 18 MCG inhalation capsule Place 1 capsule (18 mcg total) into inhaler and inhale daily. Patient not taking: Reported on 02/26/2015 05/14/14   Brand Males, MD   BP 160/72 mmHg  Pulse 115  Temp(Src) 97.5 F (36.4 C) (Oral)  Resp 30  Ht 5\' 6"  (1.676 m)  Wt 198 lb 10.2 oz (90.1 kg)  BMI 32.08 kg/m2  SpO2 91% Physical Exam  Constitutional: He is oriented to person, place, and time. He appears well-developed and well-nourished. He appears distressed.  HENT:  Head: Normocephalic and atraumatic.  Cardiovascular: Regular rhythm.   No murmur heard. tachycardic  Pulmonary/Chest:  Tachypnea with decreased air movement bilaterally  Abdominal: Soft. There is no rebound and no guarding.  Mild to moderate right sided abdominal tenderness  Musculoskeletal: He exhibits no edema or tenderness.  Neurological: He is alert and oriented to person, place, and time.  Hard of hearing, mildly confused, generalized weakness  Skin: Skin is warm and dry.  Psychiatric: He has a normal mood and affect. His behavior is normal.  Nursing note and vitals reviewed.   ED Course  Procedures  (including critical care time) Labs Review Labs Reviewed  COMPREHENSIVE METABOLIC PANEL - Abnormal; Notable for the following:    Sodium 127 (*)    Chloride 96 (*)    CO2 19 (*)    Glucose, Bld 140 (*)    Creatinine, Ser 1.28 (*)    Calcium 8.8 (*)    Albumin 3.2 (*)    AST 46 (*)    Total Bilirubin 1.4 (*)    GFR calc non Af Amer 50 (*)    GFR calc Af Amer 58 (*)    All other components within normal limits  CBC WITH DIFFERENTIAL/PLATELET - Abnormal; Notable for the following:    WBC 26.4 (*)    Neutrophils Relative % 84 (*)    Lymphocytes Relative 6 (*)    Neutro Abs 22.2 (*)    Monocytes Absolute 2.6 (*)    All other components within normal limits  URINALYSIS, ROUTINE W REFLEX MICROSCOPIC (NOT AT [facility]) - Abnormal; Notable for the following:    Color, Urine AMBER (*)    APPearance CLOUDY (*)    Bilirubin Urine SMALL (*)    Ketones, ur 15 (*)    Leukocytes, UA SMALL (*)    All other components within normal limits  URINE MICROSCOPIC-ADD ON - Abnormal; Notable for the following:    Bacteria, UA FEW (*)    Casts HYALINE CASTS (*)    All other components within normal limits  I-STAT ARTERIAL BLOOD GAS, ED - Abnormal; Notable for the following:    pCO2 arterial 29.6 (*)    pO2, Arterial 59.0 (*)    Bicarbonate 17.8 (*)    Acid-base deficit 6.0 (*)    All other components within normal limits  CULTURE, BLOOD (ROUTINE X 2)  CULTURE, BLOOD (ROUTINE X 2)  URINE CULTURE  MRSA PCR SCREENING  CULTURE, EXPECTORATED SPUTUM-ASSESSMENT  GRAM STAIN  BLOOD GAS, ARTERIAL  BRAIN NATRIURETIC PEPTIDE  TROPONIN I  LIPASE, BLOOD  HIV ANTIBODY (ROUTINE TESTING)  LEGIONELLA ANTIGEN, URINE  STREP PNEUMONIAE URINARY ANTIGEN  LACTIC ACID, PLASMA  LACTIC ACID, PLASMA  PROCALCITONIN  PROTIME-INR  APTT  CBC  CREATININE, SERUM  COMPREHENSIVE METABOLIC PANEL  CBC WITH DIFFERENTIAL/PLATELET  I-STAT CG4 LACTIC ACID, ED    Imaging Review Dg Chest 2 View  04/30/2015   CLINICAL DATA:   Acute onset of fever up to 100.4 degrees F which began yesterday. Patient was found to have leukocytosis with a white blood cell count of [id number] and was hypoxic at his primary provider's office yesterday.  EXAM: CHEST  2 VIEW  COMPARISON:  CT chest 09/17/2012.  Two-view chest x-ray 08/06/2012.  FINDINGS: AP semi-erect and lateral images were obtained. Cardiac silhouette normal in size, unchanged. Thoracic aorta  atherosclerotic and mildly tortuous, unchanged. Hilar and mediastinal contours otherwise unremarkable. Suboptimal inspiration with linear atelectasis in the left lower lobe, lingula and right middle lobe. Emphysematous changes in the upper lobes and mild to moderate central peribronchial thickening, unchanged. Dense consolidation in the lateral right lower lobe. No pleural effusions. Degenerative changes involving the thoracic and visualized upper lumbar spine. Old healed fracture involving the right clavicle.  IMPRESSION: 1. Dense consolidation in the lateral right lower lobe, likely a combination of acute atelectasis and pneumonia. 2. Suboptimal inspiration with linear atelectasis in the left lower lobe, lingula and right middle lobe.   Electronically Signed   By: Evangeline Dakin M.D.   On: 04/30/2015 20:09   I have personally reviewed and evaluated these images and lab results as part of my medical decision-making.   EKG Interpretation None      MDM   Final diagnoses:  CAP (community acquired pneumonia)  Sepsis, due to unspecified organism    Patient here for evaluation of fever, shortness of breath, abdominal pain. He recently had a CT scan performed at [facility] that was negative for acute process.  CXR with pna - treating for CAP as well as sepsis.   Discussed with patient and wife critical nature of his illness and need for admission for further treatment.  Hospitalist contacted for admission to stepdown.    Quintella Reichert, MD 05/01/15 [phone]

## 2015-05-01 NOTE — Progress Notes (Signed)
Echocardiogram 2D Echocardiogram has been performed.  Lee [name] 05/01/2015, 10:06 AM

## 2015-05-01 NOTE — Progress Notes (Signed)
Pt remains short of breath sats 92% 4L [place], still using assessory muscles to breath unable to make complete sentences without taking a breath. Family has left the room and pt has been asleep. MD paged to make aware.

## 2015-05-01 NOTE — Procedures (Signed)
Interventional Radiology Procedure Note  Procedure: Image guided percutaneous cholecystostomy.  42F drain placed.  Findings:  Purulent bile.   Complications: None Recommendations:  - F/u culture - Routine line care   Signed,  Dulcy Fanny. Earleen Newport, DO

## 2015-05-01 NOTE — Progress Notes (Signed)
[facility] PROGRESS NOTE  [name] WJX:[id number] DOB: 1930/09/02 DOA: 04/30/2015 PCP: Jerlyn Ly, MD  Brief Summary  Lee [name] is a 81 y.o. male with history of interstitial lung disease, abdominal aortic aneurysm status post repair, hyperlipidemia, hypertension, left foot drop was advised to come to the ER after patient's blood work showed leukocytosis. Patient is ago was at [facility] when patient developed abdominal pain and CT abdomen and pelvis without contrast which can be traced in care everywhere was unremarkable. As per the family patient's lipase is mildly elevated and was advised to follow-up. Patient had gone to his PCP 2 days ago and had blood work which showed leukocytosis and patient was advised to come to the ER. Lab works done in the ER shows WBC count around 26,000 and patient was still complaining of abdominal pain which is mostly in the epigastric area radiating to the back. As per patient's wife patient has significant pain yesterday and patient has largely stayed on the recliner. Denies any nausea vomiting or diarrhea. Since today morning patient has been having increasing shortness of breath with productive cough. Chest x-ray shows infiltrate concerning for pneumonia. Blood cultures has been obtained and patient has been admitted for sepsis and further workup for abdominal pain.   Assessment/Plan  Sepsis secondary to CAP vs. Possible cholecystitis -  Continue vancomycin, zosyn, and azithromycin -  IVF -  Trend lactic acid -  Sepsis protocol  Abdominal pain, improved, CT scan concerning for cholecystitis -  HIDA pending -  General surgery consulted  Acute hypoxic respiratory failure with respiratory distress/wheezing, pneumonia and history of interstitial lung disease.  Given his wheezing, i believe he probably having an acute COPD exacerbation triggered by his underlying pneumonia.  Will start steroids and breathing treatments keeping in  mind his sinus tachycardia from sepsis.   -  Start xopenex -  Resume spiriva -  Start steroids -  Already on antibiotics  Elevated troponin.  Probable demand ischemia due to sepsis and tachycardia.  Also had some AKI which is resolving.  Troponins trending down -  ECHO -  Continue aspirin, statin  -  Not on BB, but consider starting bisoprolol or other selective beta blocker  Sinus tachycardia, likely due to sepsis -  Continue IVF and repeat lactic acid -  TSH and fT4 -  D-dimer likely to be elevated in the setting of infection and we already have more likely explanation for his tachycardia and SOB a  Hypertension, blood pressure elevated -  Not able to take PO medications prior to HIDA -  Continue prn labetalol, aware that this could make wheezing worse and will transitioned ASAP post HIDA  AKI due to sepsis/dehydration, resolved with IVF  Chronic left lower extremity weakness, stable  Leukocytosis likely secondary to sepsis and stable -  Continue antibiotics and IVF  Hyponatremia due to dehydration, resolving with IVF  Diet:  Healthy heart Access:  PIV IVF:  yes Proph:  SCDs >> change to heparin if no iminent surgery  Code Status:  DNR per discussion with the patient and the son who was at bedside who witnessed the conversation Family Communication: patient and his son Disposition Plan: pending further work up for cholecystitis, improvement in breathing   Consultants:  General surgery  Procedures:  CT abd/pelvis  HIDA pending  ECHO  Antibiotics:  vanco 9/9  Zosyn 9/9    azithro 9/8   HPI/Subjective:  Denies SOB, chest pain, nausea, vomiting, abdomnial pain and  states that he feels okay.  (is in moderate respiratory distress at the time)  Objective: Filed Vitals:   05/01/15 0900 05/01/15 1000 05/01/15 1045 05/01/15 1100  BP: 192/85 158/121 146/70 152/74  Pulse: 124 116 97 102  Temp:      TempSrc:      Resp: 31 29 29  35  Height:      Weight:       SpO2: 90% 92% 94% 92%    Intake/Output Summary (Last 24 hours) at 05/01/15 1120 Last data filed at 05/01/15 1100  Gross per 24 hour  Intake 4383.33 ml  Output    100 ml  Net 4283.33 ml   Filed Weights   04/30/15 2239 05/01/15 0000 05/01/15 0500  Weight: 87.998 kg (194 lb) 90.1 kg (198 lb 10.2 oz) 90.447 kg (199 lb 6.4 oz)   Body mass index is 32.2 kg/(m^2).  Exam:   General:  Obese male, mild to moderate respiratory distress with SCM, subcostal, supraclavicular retractions  HEENT:  NCAT, MMM  Cardiovascular:  Tachycardic, RR, nl S1, S2 no mrg, 2+ pulses, warm extremities  Respiratory:  Diminished with course rales at the right base, full expiratory wheeze, rhonchorous cough  Abdomen:   NABS, soft, NT/ND, neg Murphy's sign  MSK:   Normal tone and bulk, no LEE except for left foot  Neuro:  Grossly intact except HOH and has left foot drop  Data Reviewed: Basic Metabolic Panel:  Recent Labs Lab 04/30/15 1928 05/01/15 0020 05/01/15 0615  NA 127*  --  134*  K 3.6  --  3.3*  CL 96*  --  105  CO2 19*  --  21*  GLUCOSE 140*  --  124*  BUN 16  --  10  CREATININE 1.28* 1.14 0.87  CALCIUM 8.8*  --  8.2*   Liver Function Tests:  Recent Labs Lab 04/30/15 1928 05/01/15 0615  AST 46* 39  ALT 42 34  ALKPHOS 68 57  BILITOT 1.4* 1.5*  PROT 7.2 5.9*  ALBUMIN 3.2* 2.7*    Recent Labs Lab 05/01/15 0020  LIPASE 24   No results for input(s): AMMONIA in the last 168 hours. CBC:  Recent Labs Lab 04/30/15 1928 05/01/15 0020 05/01/15 0615  WBC 26.4* 22.7* 23.2*  NEUTROABS 22.2*  --  20.1*  HGB 14.5 13.8 13.4  HCT 41.1 40.2 38.7*  MCV 88.6 90.1 89.4  PLT 242 223 217    Recent Results (from the past 240 hour(s))  MRSA PCR Screening     Status: Abnormal   Collection Time: 04/30/15 12:00 AM  Result Value Ref Range Status   MRSA by PCR POSITIVE (A) NEGATIVE Final    Comment:        The GeneXpert MRSA Assay (FDA approved for NASAL specimens only), is one  component of a comprehensive MRSA colonization surveillance program. It is not intended to diagnose MRSA infection nor to guide or monitor treatment for MRSA infections. RESULT CALLED TO, READ BACK BY AND VERIFIED WITH: A [email or web address]  05/01/15 MKELLY      Studies: Ct Abdomen Pelvis Wo Contrast  05/01/2015   CLINICAL DATA:  81 year old male with right-sided abdominal pain. Concern for sepsis  EXAM: CT ABDOMEN AND PELVIS WITHOUT CONTRAST  TECHNIQUE: Multidetector CT imaging of the abdomen and pelvis was performed following the standard protocol without IV contrast.  COMPARISON:  CT dated 10/11/2013  FINDINGS: Evaluation of this exam is limited in the absence of intravenous contrast. Evaluation is also limited due to respiratory motion  artifact.  There are emphysematous changes of the lungs. Patchy subsegmental right lung base densities may represent atelectatic changes. Pneumonia is less likely but not excluded. Clinical correlation is recommended. There is coronary vascular calcification. No intra-abdominal free air. Trace perihepatic free fluid.  A 4.5 cm grossly stable appearing hypodense lesion in the left lobe of the liver is not characterized on this study but characterized as a cyst on the prior study. There is stranding of the fat surrounding the gallbladder. There are areas of apparent thickening of the gallbladder wall. No calcified gallstone identified. Findings are concerning for an acute cholecystitis. Ultrasound is recommended for better evaluation of the gallbladder. The pancreas, spleen, and adrenal glands appear unremarkable. There are multiple right renal hypodense lesion with the largest measuring approximately 8 cm in the inferior pole of the right kidney. These lesions demonstrate fluid attenuation and likely represent cysts. Characterization is however limited on this noncontrast study. There is no hydronephrosis or nephrolithiasis on either side. The visualized ureters and urinary  bladder appear unremarkable. The prostate gland is grossly unremarkable. Coarse calcification of the prostate noted.  There is extensive diverticulosis of the sigmoid colon as well as scattered colonic diverticula without active inflammation. There is no evidence of bowel obstruction. Normal appendix.  There is aortoiliac atherosclerotic disease. 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 There is no lymphadenopathy. There is a 8 cm defect in the midline upper peritoneum with partial herniation of a segment of transverse colon. No evidence of obstruction or inflammation. This finding is new compared to the prior study. Midline vertical anterior abdominal wall incisional scar is noted. Anterior pelvic surgical scar is also noted. No fluid collection identified. There is degenerative changes of the spine. There is old healed fracture with chronic deformity of the left pubic bone and stable diastases of the symphysis pubis. No acute fracture identified. Stable L5 sclerotic focus likely represents an enostosis.  IMPRESSION: Apparent inflammatory changes of the fat surrounding the gallbladder concerning for acute cholecystitis. Ultrasound is recommended for better evaluation of the gallbladder.  Broad-based ventral hernia containing a Shaia Porath segment of the transverse colon without evidence of obstruction or inflammation.  Diverticulosis. No evidence of bowel obstruction or inflammation the   Electronically Signed   By: Anner Crete M.D.   On: 05/01/2015 01:55   Dg Chest 2 View  04/30/2015   CLINICAL DATA:  Acute onset of fever up to 100.4 degrees F which began yesterday. Patient was found to have leukocytosis with a [place] blood [place] count of [id number] and was hypoxic at his primary provider's office yesterday.  EXAM: CHEST  2 VIEW  COMPARISON:  CT chest 09/17/2012.  Two-view chest x-ray 08/06/2012.  FINDINGS: AP semi-erect and lateral images were obtained. Cardiac  silhouette normal in size, unchanged. Thoracic aorta atherosclerotic and mildly tortuous, unchanged. Hilar and mediastinal contours otherwise unremarkable. Suboptimal inspiration with linear atelectasis in the left lower lobe, lingula and right middle lobe. Emphysematous changes in the upper lobes and mild to moderate central peribronchial thickening, unchanged. Dense consolidation in the lateral right lower lobe. No pleural effusions. Degenerative changes involving the thoracic and visualized upper lumbar spine. Old healed fracture involving the right clavicle.  IMPRESSION: 1. Dense consolidation in the lateral right lower lobe, likely a combination of acute atelectasis and pneumonia. 2. Suboptimal inspiration with linear atelectasis in the left lower lobe, lingula and right middle lobe.   Electronically Signed   By: Marcello Moores  Lawrence M.D.   On: 04/30/2015 20:09    Scheduled Meds: . amLODipine  5 mg Oral Daily  . antiseptic oral rinse  7 mL Mouth Rinse BID  . azithromycin  500 mg Intravenous Q24H  . Chlorhexidine Gluconate Cloth  6 each Topical Q0600  . ezetimibe  5 mg Oral Daily  . [START ON 05/02/2015] Influenza vac split quadrivalent PF  0.5 mL Intramuscular Tomorrow-1000  . levalbuterol  1.25 mg Nebulization 4 times per day  . losartan  50 mg Oral Daily  . methylPREDNISolone (SOLU-MEDROL) injection  60 mg Intravenous BID  . mupirocin ointment  1 application Nasal BID  . niacin  500 mg Oral QHS  . pantoprazole  40 mg Oral Daily  . piperacillin-tazobactam (ZOSYN)  IV  3.375 g Intravenous Q8H  . potassium chloride  10 mEq Intravenous Q1 Hr x 2  . pravastatin  40 mg Oral q1800  . tiotropium  18 mcg Inhalation Daily  . vancomycin  750 mg Intravenous Q12H   Continuous Infusions: . sodium chloride 125 mL/hr at 05/01/15 0800    Principal Problem:   Sepsis Active Problems:   IPF (idiopathic pulmonary fibrosis)   Hypertension   Acute renal failure (ARF)   Abdominal pain   Acute respiratory  failure with hypoxia   HLD (hyperlipidemia)    Time spent: 30 min    Nataleigh Griffin, [facility] Pager [phone]. If 7PM-7AM, please contact night-coverage at [email or web address], password [facility] 05/01/2015, 11:20 AM  LOS: 1 day

## 2015-05-01 NOTE — Consult Note (Signed)
Reason for Consult:abdominal pain Referring Physician: Dr. Peggyann [name] is an 81 y.o. male.  HPI: The pt is an 82yo wm who presents with abdominal pain for about the last 4 days. The family feels it has been mostly on the right side. The pt denies pain or nausea or vomiting but he does have some dementia. CT shows possible inflammation around gallbladder but no stones. Wbc elevated  Past Medical History  Diagnosis Date  . Dyspnea   . HTN (hypertension)   . AAA (abdominal aortic aneurysm)   . Allergic rhinitis   . Erectile dysfunction   . Hyperlipidemia   . Obesity   . PVD (peripheral vascular disease)   . Basal cell carcinoma of face     Past Surgical History  Procedure Laterality Date  . Aorta surgery  2002  . Hernia repair  1979  . Urinary tract stretch      late 53s    Family History  Problem Relation Age of Onset  . Hypertension Mother   . Colon cancer Brother   . Stroke Father   . Heart disease Father     Social History:  reports that he quit smoking about 40 years ago. His smoking use included Cigarettes. He has a 25 pack-year smoking history. He has never used smokeless tobacco. He reports that he does not drink alcohol or use illicit drugs.  Allergies:  Allergies  Allergen Reactions  . Ciprofloxacin Other (See Comments)    Tingle feeling throughout body    Medications: I have reviewed the patient'[name] current medications.  Results for orders placed or performed during the hospital encounter of 04/30/15 (from the past 48 hour([name]))  MRSA PCR Screening     Status: Abnormal   Collection Time: 04/30/15 12:00 AM  Result Value Ref Range   MRSA by PCR POSITIVE (A) NEGATIVE    Comment:        The GeneXpert MRSA Assay (FDA approved for NASAL specimens only), is one component of a comprehensive MRSA colonization surveillance program. It is not intended to diagnose MRSA infection nor to guide or monitor treatment for MRSA infections. RESULT CALLED  TO, READ BACK BY AND VERIFIED WITH: A [email or web address]_0  05/01/15 MKELLY   Comprehensive metabolic panel     Status: Abnormal   Collection Time: 04/30/15  7:28 PM  Result Value Ref Range   Sodium 127 (L) 135 - 145 mmol/L   Potassium 3.6 3.5 - 5.1 mmol/L   Chloride 96 (L) 101 - 111 mmol/L   CO2 19 (L) 22 - 32 mmol/L   Glucose, Bld 140 (H) 65 - 99 mg/dL   BUN 16 6 - 20 mg/dL   Creatinine, Ser 1.28 (H) 0.61 - 1.24 mg/dL   Calcium 8.8 (L) 8.9 - 10.3 mg/dL   Total Protein 7.2 6.5 - 8.1 g/dL   Albumin 3.2 (L) 3.5 - 5.0 g/dL   AST 46 (H) 15 - 41 U/L   ALT 42 17 - 63 U/L   Alkaline Phosphatase 68 38 - 126 U/L   Total Bilirubin 1.4 (H) 0.3 - 1.2 mg/dL   GFR calc non Af Amer 50 (L) >60 mL/min   GFR calc Af Amer 58 (L) >60 mL/min    Comment: (NOTE) The eGFR has been calculated using the CKD EPI equation. This calculation has not been validated in all clinical situations. eGFR'[name] persistently <60 mL/min signify possible Chronic Kidney Disease.    Anion gap 12 5 - 15  CBC with Differential  Status: Abnormal   Collection Time: 04/30/15  7:28 PM  Result Value Ref Range   WBC 26.4 (H) 4.0 - 10.5 K/uL    Comment: REPEATED TO VERIFY   RBC 4.64 4.22 - 5.81 MIL/uL   Hemoglobin 14.5 13.0 - 17.0 g/dL   HCT 41.1 39.0 - 52.0 %   MCV 88.6 78.0 - 100.0 fL   MCH 31.3 26.0 - 34.0 pg   MCHC 35.3 30.0 - 36.0 g/dL   RDW 13.2 11.5 - 15.5 %   Platelets 242 150 - 400 K/uL   Neutrophils Relative % 84 (H) 43 - 77 %   Lymphocytes Relative 6 (L) 12 - 46 %   Monocytes Relative 10 3 - 12 %   Eosinophils Relative 0 0 - 5 %   Basophils Relative 0 0 - 1 %   Neutro Abs 22.2 (H) 1.7 - 7.7 K/uL   Lymphs Abs 1.6 0.7 - 4.0 K/uL   Monocytes Absolute 2.6 (H) 0.1 - 1.0 K/uL   Eosinophils Absolute 0.0 0.0 - 0.7 K/uL   Basophils Absolute 0.0 0.0 - 0.1 K/uL   WBC Morphology WHITE COUNT CONFIRMED ON SMEAR    Smear Review MORPHOLOGY UNREMARKABLE   I-Stat CG4 Lactic Acid, ED (Not at [facility])     Status: None   Collection  Time: 04/30/15  7:44 PM  Result Value Ref Range   Lactic Acid, Venous 1.45 0.5 - 2.0 mmol/L  Urinalysis, Routine w reflex microscopic (not at [name])     Status: Abnormal   Collection Time: 04/30/15  9:23 PM  Result Value Ref Range   Color, Urine AMBER (A) YELLOW    Comment: BIOCHEMICALS MAY BE AFFECTED BY COLOR   APPearance CLOUDY (A) CLEAR   Specific Gravity, Urine 1.021 1.005 - 1.030   pH 5.5 5.0 - 8.0   Glucose, UA NEGATIVE NEGATIVE mg/dL   Hgb urine dipstick NEGATIVE NEGATIVE   Bilirubin Urine SMALL (A) NEGATIVE   Ketones, ur 15 (A) NEGATIVE mg/dL   Protein, ur NEGATIVE NEGATIVE mg/dL   Urobilinogen, UA 1.0 0.0 - 1.0 mg/dL   Nitrite NEGATIVE NEGATIVE   Leukocytes, UA SMALL (A) NEGATIVE  Urine microscopic-add on     Status: Abnormal   Collection Time: 04/30/15  9:23 PM  Result Value Ref Range   Squamous Epithelial / LPF RARE RARE   WBC, UA 3-6 <3 WBC/hpf   RBC / HPF 0-2 <3 RBC/hpf   Bacteria, UA FEW (A) RARE   Casts HYALINE CASTS (A) NEGATIVE   Urine-Other MUCOUS PRESENT   Strep pneumoniae urinary antigen     Status: None   Collection Time: 04/30/15  9:23 PM  Result Value Ref Range   Strep Pneumo Urinary Antigen NEGATIVE NEGATIVE    Comment:        Infection due to [name]. pneumoniae cannot be absolutely ruled out since the antigen present may be below the detection limit of the test.   I-Stat arterial blood gas, ED     Status: Abnormal   Collection Time: 04/30/15 11:12 PM  Result Value Ref Range   pH, Arterial 7.387 7.350 - 7.450   pCO2 arterial 29.6 (L) 35.0 - 45.0 mmHg   pO2, Arterial 59.0 (L) 80.0 - 100.0 mmHg   Bicarbonate 17.8 (L) 20.0 - 24.0 mEq/L   TCO2 19 0 - 100 mmol/L   O2 Saturation 90.0 %   Acid-base deficit 6.0 (H) 0.0 - 2.0 mmol/L   Patient temperature 98.6 F    Collection site RADIAL,  ALLEN'[name] TEST ACCEPTABLE    Drawn by RT    Sample type ARTERIAL   Brain natriuretic peptide     Status: Abnormal   Collection Time: 05/01/15 12:20 AM  Result Value Ref  Range   B Natriuretic Peptide 240.3 (H) 0.0 - 100.0 pg/mL  Troponin I     Status: Abnormal   Collection Time: 05/01/15 12:20 AM  Result Value Ref Range   Troponin I 0.11 (H) <0.031 ng/mL    Comment:        PERSISTENTLY INCREASED TROPONIN VALUES IN THE RANGE OF 0.04-0.49 ng/mL CAN BE SEEN IN:       -UNSTABLE ANGINA       -CONGESTIVE HEART FAILURE       -MYOCARDITIS       -CHEST TRAUMA       -ARRYHTHMIAS       -LATE PRESENTING MYOCARDIAL INFARCTION       -COPD   CLINICAL FOLLOW-UP RECOMMENDED.   Lipase, blood     Status: None   Collection Time: 05/01/15 12:20 AM  Result Value Ref Range   Lipase 24 22 - 51 U/L  Lactic acid, plasma     Status: None   Collection Time: 05/01/15 12:20 AM  Result Value Ref Range   Lactic Acid, Venous 1.9 0.5 - 2.0 mmol/L  Procalcitonin     Status: None   Collection Time: 05/01/15 12:20 AM  Result Value Ref Range   Procalcitonin 0.60 ng/mL    Comment:        Interpretation: PCT > 0.5 ng/mL and <= 2 ng/mL: Systemic infection (sepsis) is possible, but other conditions are known to elevate PCT as well. (NOTE)         ICU PCT Algorithm               Non ICU PCT Algorithm    ----------------------------     ------------------------------         PCT < 0.25 ng/mL                 PCT < 0.1 ng/mL     Stopping of antibiotics            Stopping of antibiotics       strongly encouraged.               strongly encouraged.    ----------------------------     ------------------------------       PCT level decrease by               PCT < 0.25 ng/mL       >= 80% from peak PCT       OR PCT 0.25 - 0.5 ng/mL          Stopping of antibiotics                                             encouraged.     Stopping of antibiotics           encouraged.    ----------------------------     ------------------------------       PCT level decrease by              PCT >= 0.25 ng/mL       < 80% from peak PCT        AND PCT >= 0.5 ng/mL  Continuing  antibiotics                                              encouraged.       Continuing antibiotics            encouraged.    ----------------------------     ------------------------------     PCT level increase compared          PCT > 0.5 ng/mL         with peak PCT AND          PCT >= 0.5 ng/mL             Escalation of antibiotics                                          strongly encouraged.      Escalation of antibiotics        strongly encouraged.   Protime-INR     Status: Abnormal   Collection Time: 05/01/15 12:20 AM  Result Value Ref Range   Prothrombin Time 16.4 (H) 11.6 - 15.2 seconds   INR 1.31 0.00 - 1.49  APTT     Status: None   Collection Time: 05/01/15 12:20 AM  Result Value Ref Range   aPTT 36 24 - 37 seconds  CBC     Status: Abnormal   Collection Time: 05/01/15 12:20 AM  Result Value Ref Range   WBC 22.7 (H) 4.0 - 10.5 K/uL   RBC 4.46 4.22 - 5.81 MIL/uL   Hemoglobin 13.8 13.0 - 17.0 g/dL   HCT 40.2 39.0 - 52.0 %   MCV 90.1 78.0 - 100.0 fL   MCH 30.9 26.0 - 34.0 pg   MCHC 34.3 30.0 - 36.0 g/dL   RDW 13.3 11.5 - 15.5 %   Platelets 223 150 - 400 K/uL  Creatinine, serum     Status: Abnormal   Collection Time: 05/01/15 12:20 AM  Result Value Ref Range   Creatinine, Ser 1.14 0.61 - 1.24 mg/dL   GFR calc non Af Amer 58 (L) >60 mL/min   GFR calc Af Amer >60 >60 mL/min    Comment: (NOTE) The eGFR has been calculated using the CKD EPI equation. This calculation has not been validated in all clinical situations. eGFR'[name] persistently <60 mL/min signify possible Chronic Kidney Disease.     Ct Abdomen Pelvis Wo Contrast  05/01/2015   CLINICAL DATA:  81 year old male with right-sided abdominal pain. Concern for sepsis  EXAM: CT ABDOMEN AND PELVIS WITHOUT CONTRAST  TECHNIQUE: Multidetector CT imaging of the abdomen and pelvis was performed following the standard protocol without IV contrast.  COMPARISON:  CT dated 10/11/2013  FINDINGS: Evaluation of this exam is limited  in the absence of intravenous contrast. Evaluation is also limited due to respiratory motion artifact.  There are emphysematous changes of the lungs. Patchy subsegmental right lung base densities may represent atelectatic changes. Pneumonia is less likely but not excluded. Clinical correlation is recommended. There is coronary vascular calcification. No intra-abdominal free air. Trace perihepatic free fluid.  A 4.5 cm grossly stable appearing hypodense lesion in the left lobe of the liver is not characterized on this study but characterized as a cyst on the prior study. There is stranding  of the fat surrounding the gallbladder. There are areas of apparent thickening of the gallbladder wall. No calcified gallstone identified. Findings are concerning for an acute cholecystitis. Ultrasound is recommended for better evaluation of the gallbladder. The pancreas, spleen, and adrenal glands appear unremarkable. There are multiple right renal hypodense lesion with the largest measuring approximately 8 cm in the inferior pole of the right kidney. These lesions demonstrate fluid attenuation and likely represent cysts. Characterization is however limited on this noncontrast study. There is no hydronephrosis or nephrolithiasis on either side. The visualized ureters and urinary bladder appear unremarkable. The prostate gland is grossly unremarkable. Coarse calcification of the prostate noted.  There is extensive diverticulosis of the sigmoid colon as well as scattered colonic diverticula without active inflammation. There is no evidence of bowel obstruction. Normal appendix.  There is aortoiliac atherosclerotic disease. 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 There is no lymphadenopathy. There is a 8 cm defect in the midline upper peritoneum with partial herniation of a segment of transverse colon. No evidence of obstruction or inflammation. This finding is new compared to the  prior study. Midline vertical anterior abdominal wall incisional scar is noted. Anterior pelvic surgical scar is also noted. No fluid collection identified. There is degenerative changes of the spine. There is old healed fracture with chronic deformity of the left pubic bone and stable diastases of the symphysis pubis. No acute fracture identified. Stable L5 sclerotic focus likely represents an enostosis.  IMPRESSION: Apparent inflammatory changes of the fat surrounding the gallbladder concerning for acute cholecystitis. Ultrasound is recommended for better evaluation of the gallbladder.  Broad-based ventral hernia containing a short segment of the transverse colon without evidence of obstruction or inflammation.  Diverticulosis. No evidence of bowel obstruction or inflammation the   Electronically Signed   By: Anner Crete M.D.   On: 05/01/2015 01:55   Dg Chest 2 View  04/30/2015   CLINICAL DATA:  Acute onset of fever up to 100.4 degrees F which began yesterday. Patient was found to have leukocytosis with a [place] blood [place] of [address] and was hypoxic at his primary provider'[name] office yesterday.  EXAM: CHEST  2 VIEW  COMPARISON:  CT chest 09/17/2012.  Two-view chest x-ray 08/06/2012.  FINDINGS: AP semi-erect and lateral images were obtained. Cardiac silhouette normal in size, unchanged. Thoracic aorta atherosclerotic and mildly tortuous, unchanged. Hilar and mediastinal contours otherwise unremarkable. Suboptimal inspiration with linear atelectasis in the left lower lobe, lingula and right middle lobe. Emphysematous changes in the upper lobes and mild to moderate central peribronchial thickening, unchanged. Dense consolidation in the lateral right lower lobe. No pleural effusions. Degenerative changes involving the thoracic and visualized upper lumbar spine. Old healed fracture involving the right clavicle.  IMPRESSION: 1. Dense consolidation in the lateral right lower lobe, likely a combination of acute  atelectasis and pneumonia. 2. Suboptimal inspiration with linear atelectasis in the left lower lobe, lingula and right middle lobe.   Electronically Signed   By: Evangeline Dakin M.D.   On: 04/30/2015 20:09    Review of Systems  Constitutional: Negative.   HENT: Negative.   Eyes: Negative.   Respiratory: Positive for shortness of breath.   Cardiovascular: Negative.   Gastrointestinal: Positive for abdominal pain.  Genitourinary: Negative.   Musculoskeletal: Negative.   Skin: Negative.   Neurological: Negative.   Endo/Heme/Allergies: Negative.   Psychiatric/Behavioral: Negative.    Blood pressure 134/59, pulse 101, temperature 97.5 F (  36.4 C), temperature source Oral, resp. rate 31, height _0  (1.676 m), weight 90.1 kg (198 lb 10.2 oz), SpO2 93 %. Physical Exam  Constitutional: He is oriented to person, place, and time. He appears well-developed and well-nourished.  HENT:  Head: Normocephalic and atraumatic.  Eyes: Conjunctivae and EOM are normal. Pupils are equal, round, and reactive to light.  Neck: Normal range of motion. Neck supple.  Cardiovascular: Normal rate, regular rhythm and normal heart sounds.   Respiratory:  Increased work of breathing with use of accessory respiratory muscles. Slight wheeze bilaterally  GI: Soft.  There is mild tenderness in RUQ  Musculoskeletal: Normal range of motion.  Neurological: He is alert and oriented to person, place, and time.  Skin: Skin is warm and dry.  Psychiatric:  There is some confusion    Assessment/Plan:  The patient appears to have some inflammatory change around the gallbladder consistent with possible cholecystitis. In talking with his medical doctors he may be a poor candidate for surgery given his underlying pulmonary and cardiac disease. At this point I would recommend getting a HIDA scan and if this is positive then we would need to hold his blood thinners and arrange for a percutaneous cholecystostomy to be placed by  interventional radiology.  Lee [name],Lee [name] 05/01/2015, 5:28 AM

## 2015-05-01 NOTE — Progress Notes (Signed)
ANTIBIOTIC CONSULT NOTE - INITIAL  Pharmacy Consult for Vancocin and Zosyn Indication: rule out sepsis  Allergies  Allergen Reactions  . Ciprofloxacin Other (See Comments)    Tingle feeling throughout body    Patient Measurements: Height: 5\' 6"  (167.6 cm) Weight: 199 lb 6.4 oz (90.447 kg) IBW/kg (Calculated) : 63.8  Vital Signs: Temp: 97.5 F (36.4 C) (09/09 0000) Temp Source: Oral (09/09 0000) BP: 134/59 mmHg (09/09 0400) Pulse Rate: 101 (09/09 0400)  Labs:  Recent Labs  04/30/15 1928 05/01/15 0020  WBC 26.4* 22.7*  HGB 14.5 13.8  PLT 242 223  CREATININE 1.28* 1.14   Estimated Creatinine Clearance: 52.6 mL/min (by C-G formula based on Cr of 1.14).    Microbiology: Recent Results (from the past 720 hour(s))  MRSA PCR Screening     Status: Abnormal   Collection Time: 04/30/15 12:00 AM  Result Value Ref Range Status   MRSA by PCR POSITIVE (A) NEGATIVE Final    Comment:        The GeneXpert MRSA Assay (FDA approved for NASAL specimens only), is one component of a comprehensive MRSA colonization surveillance program. It is not intended to diagnose MRSA infection nor to guide or monitor treatment for MRSA infections. RESULT CALLED TO, READ BACK BY AND VERIFIED WITH: A [id number]  05/01/15 MKELLY     Medical History: Past Medical History  Diagnosis Date  . Dyspnea   . HTN (hypertension)   . AAA (abdominal aortic aneurysm)   . Allergic rhinitis   . Erectile dysfunction   . Hyperlipidemia   . Obesity   . PVD (peripheral vascular disease)   . Basal cell carcinoma of face     Medications:  Prescriptions prior to admission  Medication Sig Dispense Refill Last Dose  . amLODipine (NORVASC) 5 MG tablet Take 5 mg by mouth daily.   04/30/2015 at Unknown time  . clopidogrel (PLAVIX) 75 MG tablet Take 75 mg by mouth daily.   6 04/30/2015 at Unknown time  . dicyclomine (BENTYL) 20 MG tablet Take 20 mg by mouth every 6 (six) hours as needed for spasms.   04/30/2015  at Unknown time  . ezetimibe (ZETIA) 10 MG tablet Take 5 mg by mouth daily.    04/30/2015 at Unknown time  . losartan (COZAAR) 50 MG tablet Take 50 mg by mouth daily.   04/30/2015 at Unknown time  . niacin (NIASPAN) 500 MG CR tablet Take 500 mg by mouth at bedtime.   04/29/2015 at Unknown time  . pantoprazole (PROTONIX) 40 MG tablet Take 40 mg by mouth daily.  11 04/30/2015 at Unknown time  . Pitavastatin Calcium (LIVALO) 2 MG TABS Take 2 mg by mouth See admin instructions. Every 3rd day   04/30/2015 at Unknown time  . tiotropium (SPIRIVA) 18 MCG inhalation capsule Place 1 capsule (18 mcg total) into inhaler and inhale daily. (Patient not taking: Reported on 02/26/2015) 30 capsule 0 Not Taking at Unknown time   Scheduled:  . amLODipine  5 mg Oral Daily  . antiseptic oral rinse  7 mL Mouth Rinse BID  . azithromycin  500 mg Intravenous Q24H  . Chlorhexidine Gluconate Cloth  6 each Topical Q0600  . clopidogrel  75 mg Oral Daily  . ezetimibe  5 mg Oral Daily  . [START ON 05/02/2015] Influenza vac split quadrivalent PF  0.5 mL Intramuscular Tomorrow-1000  . losartan  50 mg Oral Daily  . mupirocin ointment  1 application Nasal BID  . niacin  500 mg Oral  QHS  . pantoprazole  40 mg Oral Daily  . pravastatin  40 mg Oral q1800   Infusions:  . sodium chloride 125 mL/hr at 05/01/15 0132    Assessment: 81yo male c/o fever, hypoxia, and leukocytosis (per PCP), also c/o abdominal pain x4d, poor surgical candidate for possible cholecystitis, lactate wnl but WBC up to 26, concern for sepsis, to begin IV ABX.  Goal of Therapy:  Vancomycin trough level 15-20 mcg/ml  Plan:  Rec'd Rocephin in ED; will begin vancomycin 750mg  IV Q12H and Zosyn 3.375g IV Q8H and monitor CBC, Cx, levels prn.  Wynona Neat, PharmD, BCPS  05/01/2015,6:52 AM

## 2015-05-01 NOTE — Progress Notes (Signed)
Pt using assessory muscles to breath, sats 90% on 4L [place]. Per family pt has been breathing like this all day. [name] NP made aware new orders received. Respiratory also called. Will continue to monitor closely.

## 2015-05-01 NOTE — Sedation Documentation (Signed)
Successful choley drain placed 

## 2015-05-02 ENCOUNTER — Inpatient Hospital Stay (HOSPITAL_COMMUNITY): Payer: Medicare Other

## 2015-05-02 ENCOUNTER — Encounter (HOSPITAL_COMMUNITY): Payer: Self-pay | Admitting: Internal Medicine

## 2015-05-02 DIAGNOSIS — K810 Acute cholecystitis: Secondary | ICD-10-CM

## 2015-05-02 DIAGNOSIS — N3000 Acute cystitis without hematuria: Secondary | ICD-10-CM

## 2015-05-02 DIAGNOSIS — I428 Other cardiomyopathies: Secondary | ICD-10-CM

## 2015-05-02 DIAGNOSIS — E785 Hyperlipidemia, unspecified: Secondary | ICD-10-CM

## 2015-05-02 DIAGNOSIS — I248 Other forms of acute ischemic heart disease: Secondary | ICD-10-CM

## 2015-05-02 DIAGNOSIS — N390 Urinary tract infection, site not specified: Secondary | ICD-10-CM

## 2015-05-02 LAB — TROPONIN I
TROPONIN I: 0.13 ng/mL — AB (ref <0.031)
TROPONIN I: 0.16 ng/mL — AB (ref <0.031)

## 2015-05-02 LAB — COMPREHENSIVE METABOLIC PANEL
ALBUMIN: 2.5 g/dL — AB (ref 3.5–5.0)
ALT: 36 U/L (ref 17–63)
ANION GAP: 9 (ref 5–15)
AST: 46 U/L — ABNORMAL HIGH (ref 15–41)
Alkaline Phosphatase: 61 U/L (ref 38–126)
BILIRUBIN TOTAL: 1.2 mg/dL (ref 0.3–1.2)
BUN: 12 mg/dL (ref 6–20)
CHLORIDE: 104 mmol/L (ref 101–111)
CO2: 19 mmol/L — ABNORMAL LOW (ref 22–32)
Calcium: 8.3 mg/dL — ABNORMAL LOW (ref 8.9–10.3)
Creatinine, Ser: 0.85 mg/dL (ref 0.61–1.24)
GFR calc Af Amer: >60 mL/min (ref >60)
GFR calc non Af Amer: >60 mL/min (ref >60)
GLUCOSE: 161 mg/dL — AB (ref 65–99)
POTASSIUM: 3.6 mmol/L (ref 3.5–5.1)
SODIUM: 132 mmol/L — AB (ref 135–145)
TOTAL PROTEIN: 6 g/dL — AB (ref 6.5–8.1)

## 2015-05-02 LAB — CBC
HEMATOCRIT: 40 % (ref 39.0–52.0)
Hemoglobin: 13.9 g/dL (ref 13.0–17.0)
MCH: 31.1 pg (ref 26.0–34.0)
MCHC: 34.8 g/dL (ref 30.0–36.0)
MCV: 89.5 fL (ref 78.0–100.0)
PLATELETS: 228 10*3/uL (ref 150–400)
RBC: 4.47 MIL/uL (ref 4.22–5.81)
RDW: 13.3 % (ref 11.5–15.5)
WBC: 22.8 10*3/uL — AB (ref 4.0–10.5)

## 2015-05-02 LAB — LACTIC ACID, PLASMA: Lactic Acid, Venous: 1.7 mmol/L (ref 0.5–2.0)

## 2015-05-02 MED ORDER — LEVALBUTEROL HCL 1.25 MG/0.5ML IN NEBU
1.2500 mg | INHALATION_SOLUTION | Freq: Four times a day (QID) | RESPIRATORY_TRACT | Status: DC
  Administered 2015-05-02 – 2015-05-05 (×13): 1.25 mg via RESPIRATORY_TRACT
  Filled 2015-05-02 (×13): qty 0.5

## 2015-05-02 MED ORDER — FUROSEMIDE 10 MG/ML IJ SOLN
40.0000 mg | Freq: Once | INTRAMUSCULAR | Status: AC
  Administered 2015-05-02: 40 mg via INTRAVENOUS
  Filled 2015-05-02: qty 4

## 2015-05-02 MED ORDER — ENOXAPARIN SODIUM 40 MG/0.4ML ~~LOC~~ SOLN
40.0000 mg | SUBCUTANEOUS | Status: DC
  Administered 2015-05-02 – 2015-05-04 (×3): 40 mg via SUBCUTANEOUS
  Filled 2015-05-02 (×3): qty 0.4

## 2015-05-02 NOTE — Progress Notes (Signed)
SUBJECTIVE: The patient is doing well today.  At this time, he denies chest pain, shortness of breath, or any new concerns.  [name] amLODipine  5 mg Oral Daily  . antiseptic oral rinse  7 mL Mouth Rinse BID  . azithromycin  500 mg Intravenous Q24H  . Chlorhexidine Gluconate Cloth  6 each Topical Q0600  . ezetimibe  5 mg Oral Daily  . Influenza vac split quadrivalent PF  0.5 mL Intramuscular Tomorrow-1000  . levalbuterol  1.25 mg Nebulization Q6H  . losartan  50 mg Oral Daily  . methylPREDNISolone (SOLU-MEDROL) injection  60 mg Intravenous BID  . metoprolol tartrate  12.5 mg Oral BID  . mupirocin ointment  1 application Nasal BID  . niacin  500 mg Oral QHS  . pantoprazole  40 mg Oral Daily  . piperacillin-tazobactam (ZOSYN)  IV  3.375 g Intravenous Q8H  . pravastatin  40 mg Oral q1800  . tiotropium  18 mcg Inhalation Daily  . vancomycin  750 mg Intravenous Q12H      OBJECTIVE: Physical Exam: Filed Vitals:   05/02/15 0900 05/02/15 1000 05/02/15 1200 05/02/15 1221  BP: 146/62 146/64 136/50   Pulse: 101 105 97   Temp:    97.3 F (36.3 C)  TempSrc:      Resp: 28 29 23    Height:      Weight:      SpO2: 99% 97% 97%     Intake/Output Summary (Last 24 hours) at 05/02/15 1353 Last data filed at 05/02/15 1300  Gross per 24 hour  Intake   3300 ml  Output   1270 ml  Net   2030 ml    Telemetry reveals sinus rhythm  GEN- The patient is well appearing, alert and oriented x 3 today.   Head- normocephalic, atraumatic Eyes-  Sclera clear, conjunctiva pink Ears- hearing intact Oropharynx- clear Neck- supple,  Lungs- Clear to ausculation bilaterally, normal work of breathing Heart- Regular rate and rhythm, no murmurs, rubs or gallops, PMI not laterally displaced GI- soft, NT, ND, + BS Extremities- no clubbing, cyanosis, or edema Skin- no rash or lesion  LABS: Basic Metabolic Panel:  Recent Labs  05/01/15 0615 05/02/15 0142  NA 134* 132*  K 3.3* 3.6  CL 105 104  CO2 21*  19*  GLUCOSE 124* 161*  BUN 10 12  CREATININE 0.87 0.85  CALCIUM 8.2* 8.3*   Liver Function Tests:  Recent Labs  05/01/15 0615 05/02/15 0142  AST 39 46*  ALT 34 36  ALKPHOS 57 61  BILITOT 1.5* 1.2  PROT 5.9* 6.0*  ALBUMIN 2.7* 2.5*    Recent Labs  05/01/15 0020  LIPASE 24   CBC:  Recent Labs  04/30/15 1928  05/01/15 0615 05/02/15 0142  WBC 26.4*  < > 23.2* 22.8*  NEUTROABS 22.2*  --  20.1*  --   HGB 14.5  < > 13.4 13.9  HCT 41.1  < > 38.7* 40.0  MCV 88.6  < > 89.4 89.5  PLT 242  < > 217 228  < > = values in this interval not displayed. Cardiac Enzymes:  Recent Labs  05/01/15 1950 05/02/15 0142 05/02/15 0810  TROPONINI 0.12* 0.13* 0.16*    ASSESSMENT AND PLAN:  Principal Problem:   Sepsis Active Problems:   IPF (idiopathic pulmonary fibrosis)   Hypertension   Acute renal failure (ARF)   Abdominal pain   Acute respiratory failure with hypoxia   HLD (hyperlipidemia)   CAP (community acquired pneumonia)  Elevated troponin   Cholecystitis   LV dysfunction  1. Elevated troponin Likely due to metabolic illness/ lactic acidosis Not an acute MI He does have a mildly reduced EF with WMA on echo.  Would recommend lexiscan myoview in the office once clinically improved and prior to any further surgery for better risk stratification  2. Reduced EF As above On losartan Would switch metoprolol to coreg  3. HTN Stable No change required today  4. HL On statin  Cardiology to see as needed over the weekend Please call with questions  Thompson Grayer, MD 05/02/2015 1:53 PM

## 2015-05-02 NOTE — Progress Notes (Signed)
Subjective: Perc chole done yesterday with purulence, states abdomen feels better, needs to have bm  Objective: Vital signs in last 24 hours: Temp:  [97.3 F (36.3 C)-100 F (37.8 C)] 97.4 F (36.3 C) (09/10 0736) Pulse Rate:  [87-124] 87 (09/10 0400) Resp:  [16-36] 25 (09/10 0400) BP: (116-192)/(38-121) 120/52 mmHg (09/10 0400) SpO2:  [90 %-96 %] 93 % (09/10 0400) FiO2 (%):  [50 %] 50 % (09/10 0015) Weight:  [90.855 kg (200 lb 4.8 oz)] 90.855 kg (200 lb 4.8 oz) (09/10 0500) Last BM Date: 04/30/15  Intake/Output from previous day: 09/09 0701 - 09/10 0700 In: 3850 [I.V.:2750; IV Piggyback:1100] Out: 395 [Urine:350; Drains:45] Intake/Output this shift:    Abdomen still with some ruq tenderness, reducible ventral hernias, drain in place  Lab Results:   Recent Labs  05/01/15 0615 05/02/15 0142  WBC 23.2* 22.8*  HGB 13.4 13.9  HCT 38.7* 40.0  PLT 217 228   BMET  Recent Labs  05/01/15 0615 05/02/15 0142  NA 134* 132*  K 3.3* 3.6  CL 105 104  CO2 21* 19*  GLUCOSE 124* 161*  BUN 10 12  CREATININE 0.87 0.85  CALCIUM 8.2* 8.3*   PT/INR  Recent Labs  05/01/15 0020  LABPROT 16.4*  INR 1.31   ABG  Recent Labs  04/30/15 2312 05/01/15 2311  PHART 7.387 7.432  HCO3 17.8* 17.8*    Studies/Results: Ct Abdomen Pelvis Wo Contrast  05/01/2015   CLINICAL DATA:  81 year old male with right-sided abdominal pain. Concern for sepsis  EXAM: CT ABDOMEN AND PELVIS WITHOUT CONTRAST  TECHNIQUE: Multidetector CT imaging of the abdomen and pelvis was performed following the standard protocol without IV contrast.  COMPARISON:  CT dated 10/11/2013  FINDINGS: Evaluation of this exam is limited in the absence of intravenous contrast. Evaluation is also limited due to respiratory motion artifact.  There are emphysematous changes of the lungs. Patchy subsegmental right lung base densities may represent atelectatic changes. Pneumonia is less likely but not excluded. Clinical  correlation is recommended. There is coronary vascular calcification. No intra-abdominal free air. Trace perihepatic free fluid.  A 4.5 cm grossly stable appearing hypodense lesion in the left lobe of the liver is not characterized on this study but characterized as a cyst on the prior study. There is stranding of the fat surrounding the gallbladder. There are areas of apparent thickening of the gallbladder wall. No calcified gallstone identified. Findings are concerning for an acute cholecystitis. Ultrasound is recommended for better evaluation of the gallbladder. The pancreas, spleen, and adrenal glands appear unremarkable. There are multiple right renal hypodense lesion with the largest measuring approximately 8 cm in the inferior pole of the right kidney. These lesions demonstrate fluid attenuation and likely represent cysts. Characterization is however limited on this noncontrast study. There is no hydronephrosis or nephrolithiasis on either side. The visualized ureters and urinary bladder appear unremarkable. The prostate gland is grossly unremarkable. Coarse calcification of the prostate noted.  There is extensive diverticulosis of the sigmoid colon as well as scattered colonic diverticula without active inflammation. There is no evidence of bowel obstruction. Normal appendix.  There is aortoiliac atherosclerotic disease. 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 There is no lymphadenopathy. There is a 8 cm defect in the midline upper peritoneum with partial herniation of a segment of transverse colon. No evidence of obstruction or inflammation. This finding is new compared to the prior study. Midline vertical anterior abdominal wall  incisional scar is noted. Anterior pelvic surgical scar is also noted. No fluid collection identified. There is degenerative changes of the spine. There is old healed fracture with chronic deformity of the left pubic bone and stable  diastases of the symphysis pubis. No acute fracture identified. Stable L5 sclerotic focus likely represents an enostosis.  IMPRESSION: Apparent inflammatory changes of the fat surrounding the gallbladder concerning for acute cholecystitis. Ultrasound is recommended for better evaluation of the gallbladder.  Broad-based ventral hernia containing a short segment of the transverse colon without evidence of obstruction or inflammation.  Diverticulosis. No evidence of bowel obstruction or inflammation the   Electronically Signed   By: Anner Crete M.D.   On: 05/01/2015 01:55   Dg Chest 2 View  04/30/2015   CLINICAL DATA:  Acute onset of fever up to 100.4 degrees F which began yesterday. Patient was found to have leukocytosis with a [place] blood [place] of [id number] and was hypoxic at his primary provider's office yesterday.  EXAM: CHEST  2 VIEW  COMPARISON:  CT chest 09/17/2012.  Two-view chest x-ray 08/06/2012.  FINDINGS: AP semi-erect and lateral images were obtained. Cardiac silhouette normal in size, unchanged. Thoracic aorta atherosclerotic and mildly tortuous, unchanged. Hilar and mediastinal contours otherwise unremarkable. Suboptimal inspiration with linear atelectasis in the left lower lobe, lingula and right middle lobe. Emphysematous changes in the upper lobes and mild to moderate central peribronchial thickening, unchanged. Dense consolidation in the lateral right lower lobe. No pleural effusions. Degenerative changes involving the thoracic and visualized upper lumbar spine. Old healed fracture involving the right clavicle.  IMPRESSION: 1. Dense consolidation in the lateral right lower lobe, likely a combination of acute atelectasis and pneumonia. 2. Suboptimal inspiration with linear atelectasis in the left lower lobe, lingula and right middle lobe.   Electronically Signed   By: Evangeline Dakin M.D.   On: 04/30/2015 20:09   Nm Hepatobiliary Liver Func  05/01/2015   CLINICAL DATA:  Abdominal pain.   Abnormal CT.  EXAM: NUCLEAR MEDICINE HEPATOBILIARY IMAGING  TECHNIQUE: Sequential images of the abdomen were obtained out to 60 minutes following intravenous administration of radiopharmaceutical. 3.6 mg of morphine administered IV  RADIOPHARMACEUTICALS:  5.2 MCi Tc-24m  Choletec IV  COMPARISON:  None.  FINDINGS: Liver, biliary system, bowel visualize normally. The gallbladder is not definitely identified. Morphine administered again without gallbladder visualization. These findings suggest cholecystitis. Gallbladder ultrasound should be considered for further evaluation.  IMPRESSION: Findings suggesting cholecystitis.   Electronically Signed   By: Marcello Moores  Register   On: 05/01/2015 13:26   Ir Perc Cholecystostomy  05/01/2015   INDICATION: 81 year old male with a history of acute cholecystitis  EXAM: ULTRASOUND AND FLUOROSCOPIC-GUIDED CHOLECYSTOSTOMY TUBE PLACEMENT  COMPARISON:  CT 05/01/2015  MEDICATIONS: Fentanyl 75 mcg IV; Versed 2.0 mg IV; 500 mg Levaquin  ANESTHESIA/SEDATION: Total Moderate Sedation Time  Twenty-five minutes  CONTRAST:  42mL OMNIPAQUE IOHEXOL 300 MG/ML  SOLN  FLUOROSCOPY TIME:  54 seconds, 0 minutes  COMPLICATIONS: None  PROCEDURE: Informed written consent was obtained from the patient and the patient's family after a discussion of the risks, benefits and alternatives to treatment. Questions regarding the procedure were encouraged and answered. A timeout was performed prior to the initiation of the procedure.  The right upper abdominal quadrant was prepped and draped in the usual sterile fashion, and a sterile drape was applied covering the operative field. Maximum barrier sterile technique with sterile gowns and gloves were used for the procedure. A timeout was performed prior  to the initiation of the procedure. Local anesthesia was provided with 1% lidocaine with epinephrine.  Ultrasound scanning of the right upper quadrant demonstrates a markedly dilated gallbladder. A transhepatic approach  was attempted, however, given the superiorly positioned gallbladder, there is a concern for transgressing the diaphragm, and a rib space inferiorly was selected for puncture. This lower puncture may or may not represent a transhepatic approach. A 22 gauge needle was advanced into the gallbladder under direct ultrasound guidance. An ultrasound image was saved for documentation purposes. Appropriate intraluminal puncture was confirmed with the efflux of bile and advancement of an 0.018 wire into the gallbladder lumen. The needle was exchanged for an [place] set. A small amount of contrast was injected to confirm appropriate intraluminal positioning. Over a [place] wire, a 69.2-[place] cholecystomy tube was advanced into the gallbladder fossa, coiled and locked. Bile was aspirated and a small amount of contrast was injected as several post procedural spot radiographic images were obtained in various obliquities. The catheter was secured to the skin with suture, connected to a drainage bag and a dressing was placed. The patient tolerated the procedure well without immediate post procedural complication.  IMPRESSION: Status post image guided percutaneous cholecystostomy. Sample sent to the lab for analysis.  Signed,  Dulcy Fanny. Earleen Newport, DO  [facility]  [facility]   Electronically Signed   By: Corrie Mckusick D.O.   On: 05/01/2015 18:11   Dg Chest Port 1 View  05/02/2015   CLINICAL DATA:  Acute respiratory failure, hypoxia  EXAM: PORTABLE CHEST - 1 VIEW  COMPARISON:  04/30/2015  FINDINGS: Heart size at upper limits of normal. Lungs are hypoaerated with crowding of the bronchovascular markings. Increased interstitial markings and patchy bibasilar airspace opacities are noted. Increased small pleural effusions. No pneumothorax. Pigtail catheter projects over the right upper quadrant.  IMPRESSION: Borderline cardiomegaly with increased interstitial markings which may  indicate development of interstitial edema.  New patchy bibasilar airspace opacities could indicate atelectasis given the low lung volumes although pneumonia or asymmetric edema could appear similar.   Electronically Signed   By: Conchita Paris M.D.   On: 05/02/2015 07:35    Anti-infectives: Anti-infectives    Start     Dose/Rate Route Frequency Ordered Stop   05/01/15 2200  cefTRIAXone (ROCEPHIN) 1 g in dextrose 5 % 50 mL IVPB  Status:  Discontinued     1 g 100 mL/hr over 30 Minutes Intravenous Every 24 hours 04/30/15 2352 05/01/15 0648   05/01/15 2200  azithromycin (ZITHROMAX) 500 mg in dextrose 5 % 250 mL IVPB     500 mg 250 mL/hr over 60 Minutes Intravenous Every 24 hours 04/30/15 2352 05/08/15 2159   05/01/15 1730  levofloxacin (LEVAQUIN) IVPB 500 mg     500 mg 100 mL/hr over 60 Minutes Intravenous  Once 05/01/15 1727 05/01/15 1849   05/01/15 1400  piperacillin-tazobactam (ZOSYN) IVPB 3.375 g     3.375 g 12.5 mL/hr over 240 Minutes Intravenous Every 8 hours 05/01/15 0704     05/01/15 0800  vancomycin (VANCOCIN) IVPB 750 mg/150 ml premix     750 mg 150 mL/hr over 60 Minutes Intravenous Every 12 hours 05/01/15 0704     05/01/15 0715  piperacillin-tazobactam (ZOSYN) IVPB 3.375 g     3.375 g 100 mL/hr over 30 Minutes Intravenous  Once 05/01/15 0704 05/01/15 0850   04/30/15 2045  cefTRIAXone (ROCEPHIN) 1 g in dextrose 5 % 50 mL IVPB  1 g 100 mL/hr over 30 Minutes Intravenous  Once 04/30/15 2030 04/30/15 2217   04/30/15 2030  piperacillin-tazobactam (ZOSYN) IVPB 3.375 g  Status:  Discontinued     3.375 g 100 mL/hr over 30 Minutes Intravenous  Once 04/30/15 2028 04/30/15 2029   04/30/15 2030  azithromycin (ZITHROMAX) 500 mg in dextrose 5 % 250 mL IVPB     500 mg 250 mL/hr over 60 Minutes Intravenous  Once 04/30/15 2030 04/30/15 2217      Assessment/Plan: Cholecystitis  Drain now in since yesterday, would continue abx for now At some point if conservative therapy working  would get [place] to see if he has stone disease or this is acalculous which will make long term recs different   Lee [name] 05/02/2015

## 2015-05-02 NOTE — Progress Notes (Signed)
[facility] PROGRESS NOTE  Lee [name] BZJ:[id number] DOB: 25-Jan-1931 DOA: 04/30/2015 PCP: Lee Ly, MD  Brief Summary  Lee [name] is a 81 y.o. male with history of interstitial lung disease, abdominal aortic aneurysm status post repair, hyperlipidemia, hypertension, left foot drop was advised to come to the ER after patient's blood work showed leukocytosis. Patient is ago was at [facility] when patient developed abdominal pain and CT abdomen and pelvis without contrast which can be traced in care everywhere was unremarkable. As per the family patient's lipase is mildly elevated and was advised to follow-up. Patient had gone to his PCP 2 days ago and had blood work which showed leukocytosis and patient was advised to come to the ER. Lab works done in the ER shows WBC count around 26,000 and patient was still complaining of abdominal pain which is mostly in the epigastric area radiating to the back. As per patient's wife patient has significant pain yesterday and patient has largely stayed on the recliner. Denies any nausea vomiting or diarrhea. Since today morning patient has been having increasing shortness of breath with productive cough. Chest x-ray shows infiltrate concerning for pneumonia. Blood cultures has been obtained and patient has been admitted for sepsis and further workup for abdominal pain.   Assessment/Plan  Sepsis secondary to acute cholecystitis and possible CAP and UTI, heart rate trending down -  D/c vancomycin and azithromycin -  Continue Zosyn -  Blood cultures no growth to date 2 days -  Follow-up wound culture from percutaneous cholecystostomy drain  Acute cholecystitis -  Continue antibiotics as above -  Cholecystostomy drain placed on 9/9 by interventional radiology -  Plan for follow-up with IR drain clinic in 6 weeks -  Appreciate general surgery assistance -  If patient is determined to have an obstructing stone or gallstones on  follow-up imaging, he may eventually need a laparoscopic cholecystectomy, however currently is too unstable for surgery and this can be done as an outpatient  Acute hypoxic respiratory failure with respiratory distress/wheezing, pneumonia and history of interstitial lung disease.  Given his wheezing, i believe he probably having an acute COPD exacerbation triggered by his underlying pneumonia.  Also has acute systolic heart failure with ejection fraction of 40-45% and evidence of pulmonary edema on chest x-ray. -  Continue xopenex -  Continue spiriva -  Continue steroids -  Continue antibiotics  Acute systolic heart failure -  Daily weights -  Strict ins and outs -  Start Lasix 40 mg IV once and follow-up blood pressure -  If blood pressure remained stable, consider increasing to 40 mg IV twice a day with careful monitoring of creatinine -  Beta blocker and ARB per cardiology  Elevated troponin.  Probable demand ischemia due to sepsis and tachycardia.   Troponins approximately flat.  Has new wall motion abnormality on echocardiogram and has new interventricular conduction delay versus new left bundle branch block on EKG compared to prior from several years ago. -  Continue aspirin, statin, beta blocker   Sinus tachycardia, likely due to sepsis, trending down with antibiotics -  TSH and fT4 within normal limits -  D-dimer likely to be elevated in the setting of infection and we already have more likely explanation for his tachycardia and SOB a  Hypertension, blood pressure improving with carvedilol, losartan  AKI due to sepsis/dehydration, resolved with IVF  Chronic left lower extremity weakness, stable  Leukocytosis likely secondary to sepsis and stable -  Continue antibiotics  and IVF  Hyponatremia due to dehydration, resolving with IVF  Diet:  Healthy heart Access:  PIV IVF:  Off Proph: Lovenox   Code Status:  DNR per discussion with the patient and the son who was at bedside  who witnessed the conversation on 9/9 Family Communication: patient alone Disposition Plan:  Pending improvement in breathing, PT/OT evaluation, afebrile and WBC at least trending down.  Anticipate SNF at discharge.     Consultants:  General surgery  Procedures:  CT abd/pelvis  HIDA pending  ECHO  Antibiotics:  vanco 9/9  Zosyn 9/9    azithro 9/8   HPI/Subjective:  Denies SOB, chest pain, nausea, vomiting.  Has some RUQ pain where drain is.  Objective: Filed Vitals:   05/02/15 1000 05/02/15 1200 05/02/15 1221 05/02/15 1431  BP: 146/64 136/50    Pulse: 105 97    Temp:   97.3 F (36.3 C)   TempSrc:      Resp: 29 23    Height:      Weight:      SpO2: 97% 97%  97%    Intake/Output Summary (Last 24 hours) at 05/02/15 1515 Last data filed at 05/02/15 1414  Gross per 24 hour  Intake   3290 ml  Output   1320 ml  Net   1970 ml   Filed Weights   05/01/15 0000 05/01/15 0500 05/02/15 0500  Weight: 90.1 kg (198 lb 10.2 oz) 90.447 kg (199 lb 6.4 oz) 90.855 kg (200 lb 4.8 oz)   Body mass index is 32.34 kg/(m^2).  Exam:   General:  Obese male, mild respiratory distress with SCM, subcostal, supraclavicular retractions  HEENT:  NCAT, MMM  Cardiovascular:  RR, nl S1, S2 no mrg, 2+ pulses, warm extremities  Respiratory:  Diminished with course rales at the right base, full expiratory wheeze, rhonchorous cough  Abdomen:   NABS, soft, TTP in the RUQ, drain in place with semipurulent material mixed with serosanguinous material  MSK:   Normal tone and bulk, no LEE except for left foot  Neuro:  Grossly intact except HOH and has left foot drop  Data Reviewed: Basic Metabolic Panel:  Recent Labs Lab 04/30/15 1928 05/01/15 0020 05/01/15 0615 05/02/15 0142  NA 127*  --  134* 132*  K 3.6  --  3.3* 3.6  CL 96*  --  105 104  CO2 19*  --  21* 19*  GLUCOSE 140*  --  124* 161*  BUN 16  --  10 12  CREATININE 1.28* 1.14 0.87 0.85  CALCIUM 8.8*  --  8.2* 8.3*    Liver Function Tests:  Recent Labs Lab 04/30/15 1928 05/01/15 0615 05/02/15 0142  AST 46* 39 46*  ALT 42 34 36  ALKPHOS 68 57 61  BILITOT 1.4* 1.5* 1.2  PROT 7.2 5.9* 6.0*  ALBUMIN 3.2* 2.7* 2.5*    Recent Labs Lab 05/01/15 0020  LIPASE 24   No results for input(s): AMMONIA in the last 168 hours. CBC:  Recent Labs Lab 04/30/15 1928 05/01/15 0020 05/01/15 0615 05/02/15 0142  WBC 26.4* 22.7* 23.2* 22.8*  NEUTROABS 22.2*  --  20.1*  --   HGB 14.5 13.8 13.4 13.9  HCT 41.1 40.2 38.7* 40.0  MCV 88.6 90.1 89.4 89.5  PLT 242 223 217 228    Recent Results (from the past 240 hour(s))  MRSA PCR Screening     Status: Abnormal   Collection Time: 04/30/15 12:00 AM  Result Value Ref Range Status  MRSA by PCR POSITIVE (A) NEGATIVE Final    Comment:        The GeneXpert MRSA Assay (FDA approved for NASAL specimens only), is one component of a comprehensive MRSA colonization surveillance program. It is not intended to diagnose MRSA infection nor to guide or monitor treatment for MRSA infections. RESULT CALLED TO, READ BACK BY AND VERIFIED WITH: A [email or web address]  05/01/15 MKELLY   Culture, blood (routine x 2)     Status: None (Preliminary result)   Collection Time: 04/30/15  8:36 PM  Result Value Ref Range Status   Specimen Description BLOOD RIGHT FOREARM  Final   Special Requests BOTTLES DRAWN AEROBIC AND ANAEROBIC 5CC  Final   Culture NO GROWTH 2 DAYS  Final   Report Status PENDING  Incomplete  Culture, blood (routine x 2)     Status: None (Preliminary result)   Collection Time: 04/30/15  8:42 PM  Result Value Ref Range Status   Specimen Description BLOOD LEFT ARM  Final   Special Requests BOTTLES DRAWN AEROBIC AND ANAEROBIC 5CC  Final   Culture NO GROWTH 2 DAYS  Final   Report Status PENDING  Incomplete  Urine culture     Status: None (Preliminary result)   Collection Time: 04/30/15  9:23 PM  Result Value Ref Range Status   Specimen Description URINE, CLEAN  CATCH  Final   Special Requests NONE  Final   Culture >=100,000 COLONIES/mL GRAM NEGATIVE RODS  Final   Report Status PENDING  Incomplete  Culture, routine-abscess     Status: None (Preliminary result)   Collection Time: 05/01/15  6:55 PM  Result Value Ref Range Status   Specimen Description ABSCESS  Final   Special Requests GALLBLADDER  Final   Gram Stain   Final    ABUNDANT WBC PRESENT,BOTH PMN AND MONONUCLEAR NO SQUAMOUS EPITHELIAL CELLS SEEN FEW GRAM NEGATIVE RODS Performed at [facility]    Culture PENDING  Incomplete   Report Status PENDING  Incomplete     Studies: Ct Abdomen Pelvis Wo Contrast  05/01/2015   CLINICAL DATA:  81 year old male with right-sided abdominal pain. Concern for sepsis  EXAM: CT ABDOMEN AND PELVIS WITHOUT CONTRAST  TECHNIQUE: Multidetector CT imaging of the abdomen and pelvis was performed following the standard protocol without IV contrast.  COMPARISON:  CT dated 10/11/2013  FINDINGS: Evaluation of this exam is limited in the absence of intravenous contrast. Evaluation is also limited due to respiratory motion artifact.  There are emphysematous changes of the lungs. Patchy subsegmental right lung base densities may represent atelectatic changes. Pneumonia is less likely but not excluded. Clinical correlation is recommended. There is coronary vascular calcification. No intra-abdominal free air. Trace perihepatic free fluid.  A 4.5 cm grossly stable appearing hypodense lesion in the left lobe of the liver is not characterized on this study but characterized as a cyst on the prior study. There is stranding of the fat surrounding the gallbladder. There are areas of apparent thickening of the gallbladder wall. No calcified gallstone identified. Findings are concerning for an acute cholecystitis. Ultrasound is recommended for better evaluation of the gallbladder. The pancreas, spleen, and adrenal glands appear unremarkable. There are multiple right renal hypodense  lesion with the largest measuring approximately 8 cm in the inferior pole of the right kidney. These lesions demonstrate fluid attenuation and likely represent cysts. Characterization is however limited on this noncontrast study. There is no hydronephrosis or nephrolithiasis on either side. The visualized ureters and urinary bladder appear unremarkable.  The prostate gland is grossly unremarkable. Coarse calcification of the prostate noted.  There is extensive diverticulosis of the sigmoid colon as well as scattered colonic diverticula without active inflammation. There is no evidence of bowel obstruction. Normal appendix.  There is aortoiliac atherosclerotic disease. 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 There is no lymphadenopathy. There is a 8 cm defect in the midline upper peritoneum with partial herniation of a segment of transverse colon. No evidence of obstruction or inflammation. This finding is new compared to the prior study. Midline vertical anterior abdominal wall incisional scar is noted. Anterior pelvic surgical scar is also noted. No fluid collection identified. There is degenerative changes of the spine. There is old healed fracture with chronic deformity of the left pubic bone and stable diastases of the symphysis pubis. No acute fracture identified. Stable L5 sclerotic focus likely represents an enostosis.  IMPRESSION: Apparent inflammatory changes of the fat surrounding the gallbladder concerning for acute cholecystitis. Ultrasound is recommended for better evaluation of the gallbladder.  Broad-based ventral hernia containing a Thea Holshouser segment of the transverse colon without evidence of obstruction or inflammation.  Diverticulosis. No evidence of bowel obstruction or inflammation the   Electronically Signed   By: Anner Crete M.D.   On: 05/01/2015 01:55   Dg Chest 2 View  04/30/2015   CLINICAL DATA:  Acute onset of fever up to 100.4 degrees F  which began yesterday. Patient was found to have leukocytosis with a [place] blood [place] [id number] and was hypoxic at his primary provider's office yesterday.  EXAM: CHEST  2 VIEW  COMPARISON:  CT chest 09/17/2012.  Two-view chest x-ray 08/06/2012.  FINDINGS: AP semi-erect and lateral images were obtained. Cardiac silhouette normal in size, unchanged. Thoracic aorta atherosclerotic and mildly tortuous, unchanged. Hilar and mediastinal contours otherwise unremarkable. Suboptimal inspiration with linear atelectasis in the left lower lobe, lingula and right middle lobe. Emphysematous changes in the upper lobes and mild to moderate central peribronchial thickening, unchanged. Dense consolidation in the lateral right lower lobe. No pleural effusions. Degenerative changes involving the thoracic and visualized upper lumbar spine. Old healed fracture involving the right clavicle.  IMPRESSION: 1. Dense consolidation in the lateral right lower lobe, likely a combination of acute atelectasis and pneumonia. 2. Suboptimal inspiration with linear atelectasis in the left lower lobe, lingula and right middle lobe.   Electronically Signed   By: Evangeline Dakin M.D.   On: 04/30/2015 20:09   Nm Hepatobiliary Liver Func  05/01/2015   CLINICAL DATA:  Abdominal pain.  Abnormal CT.  EXAM: NUCLEAR MEDICINE HEPATOBILIARY IMAGING  TECHNIQUE: Sequential images of the abdomen were obtained out to 60 minutes following intravenous administration of radiopharmaceutical. 3.6 mg of morphine administered IV  RADIOPHARMACEUTICALS:  5.2 MCi Tc-28m  Choletec IV  COMPARISON:  None.  FINDINGS: Liver, biliary system, bowel visualize normally. The gallbladder is not definitely identified. Morphine administered again without gallbladder visualization. These findings suggest cholecystitis. Gallbladder ultrasound should be considered for further evaluation.  IMPRESSION: Findings suggesting cholecystitis.   Electronically Signed   By: Marcello Moores  Register    On: 05/01/2015 13:26   Ir Perc Cholecystostomy  05/01/2015   INDICATION: 81 year old male with a history of acute cholecystitis  EXAM: ULTRASOUND AND FLUOROSCOPIC-GUIDED CHOLECYSTOSTOMY TUBE PLACEMENT  COMPARISON:  CT 05/01/2015  MEDICATIONS: Fentanyl 75 mcg IV; Versed 2.0 mg IV; 500 mg Levaquin  ANESTHESIA/SEDATION: Total Moderate Sedation Time  Twenty-five minutes  CONTRAST:  85mL  OMNIPAQUE IOHEXOL 300 MG/ML  SOLN  FLUOROSCOPY TIME:  54 seconds, 0 minutes  COMPLICATIONS: None  PROCEDURE: Informed written consent was obtained from the patient and the patient's family after a discussion of the risks, benefits and alternatives to treatment. Questions regarding the procedure were encouraged and answered. A timeout was performed prior to the initiation of the procedure.  The right upper abdominal quadrant was prepped and draped in the usual sterile fashion, and a sterile drape was applied covering the operative field. Maximum barrier sterile technique with sterile gowns and gloves were used for the procedure. A timeout was performed prior to the initiation of the procedure. Local anesthesia was provided with 1% lidocaine with epinephrine.  Ultrasound scanning of the right upper quadrant demonstrates a markedly dilated gallbladder. A transhepatic approach was attempted, however, given the superiorly positioned gallbladder, there is a concern for transgressing the diaphragm, and a rib space inferiorly was selected for puncture. This lower puncture may or may not represent a transhepatic approach. A 22 gauge needle was advanced into the gallbladder under direct ultrasound guidance. An ultrasound image was saved for documentation purposes. Appropriate intraluminal puncture was confirmed with the efflux of bile and advancement of an 0.018 wire into the gallbladder lumen. The needle was exchanged for an [place] set. A small amount of contrast was injected to confirm appropriate intraluminal positioning. Over a Benson  wire, a 96.2-French Cook cholecystomy tube was advanced into the gallbladder fossa, coiled and locked. Bile was aspirated and a small amount of contrast was injected as several post procedural spot radiographic images were obtained in various obliquities. The catheter was secured to the skin with suture, connected to a drainage bag and a dressing was placed. The patient tolerated the procedure well without immediate post procedural complication.  IMPRESSION: Status post image guided percutaneous cholecystostomy. Sample sent to the lab for analysis.  Signed,  Dulcy Fanny. Earleen Newport, DO  [facility]  [facility]   Electronically Signed   By: Corrie Mckusick D.O.   On: 05/01/2015 18:11   Dg Chest Port 1 View  05/02/2015   CLINICAL DATA:  Acute respiratory failure, hypoxia  EXAM: PORTABLE CHEST - 1 VIEW  COMPARISON:  04/30/2015  FINDINGS: Heart size at upper limits of normal. Lungs are hypoaerated with crowding of the bronchovascular markings. Increased interstitial markings and patchy bibasilar airspace opacities are noted. Increased small pleural effusions. No pneumothorax. Pigtail catheter projects over the right upper quadrant.  IMPRESSION: Borderline cardiomegaly with increased interstitial markings which may indicate development of interstitial edema.  New patchy bibasilar airspace opacities could indicate atelectasis given the low lung volumes although pneumonia or asymmetric edema could appear similar.   Electronically Signed   By: Conchita Paris M.D.   On: 05/02/2015 07:35    Scheduled Meds: . amLODipine  5 mg Oral Daily  . antiseptic oral rinse  7 mL Mouth Rinse BID  . azithromycin  500 mg Intravenous Q24H  . Chlorhexidine Gluconate Cloth  6 each Topical Q0600  . ezetimibe  5 mg Oral Daily  . Influenza vac split quadrivalent PF  0.5 mL Intramuscular Tomorrow-1000  . levalbuterol  1.25 mg Nebulization Q6H  . losartan  50 mg Oral Daily  . methylPREDNISolone  (SOLU-MEDROL) injection  60 mg Intravenous BID  . metoprolol tartrate  12.5 mg Oral BID  . mupirocin ointment  1 application Nasal BID  . niacin  500 mg Oral QHS  . pantoprazole  40 mg Oral Daily  .  piperacillin-tazobactam (ZOSYN)  IV  3.375 g Intravenous Q8H  . pravastatin  40 mg Oral q1800  . tiotropium  18 mcg Inhalation Daily  . vancomycin  750 mg Intravenous Q12H   Continuous Infusions:    Principal Problem:   Sepsis Active Problems:   IPF (idiopathic pulmonary fibrosis)   Hypertension   Acute renal failure (ARF)   Abdominal pain   Acute respiratory failure with hypoxia   HLD (hyperlipidemia)   CAP (community acquired pneumonia)   Elevated troponin   Cholecystitis   LV dysfunction    Time spent: 30 min    Araminta Zorn, [facility] Pager [phone]. If 7PM-7AM, please contact night-coverage at [email or web address], password [facility] 05/02/2015, 3:15 PM  LOS: 2 days

## 2015-05-02 NOTE — Progress Notes (Signed)
Referring Physician(s): [facility] - Dr. Marlou Starks  Chief Complaint: Acute Cholecystitis S/P Perc Chole drain placed yesterday by Dr. Earleen Newport  Subjective:  [name] is doing ok today.  He has no complaints He didn't remember he had a drain placed yesterday Family member at bedside He denies any soreness or pain at drain site He denies any current RUQ pain He is tolerating clear liquid diet.  Allergies: Ciprofloxacin  Medications: Prior to Admission medications   Medication Sig Start Date End Date Taking? Authorizing Provider  amLODipine (NORVASC) 5 MG tablet Take 5 mg by mouth daily.   Yes Historical Provider, MD  clopidogrel (PLAVIX) 75 MG tablet Take 75 mg by mouth daily.  09/24/14  Yes Historical Provider, MD  dicyclomine (BENTYL) 20 MG tablet Take 20 mg by mouth every 6 (six) hours as needed for spasms.   Yes Historical Provider, MD  ezetimibe (ZETIA) 10 MG tablet Take 5 mg by mouth daily.    Yes Historical Provider, MD  losartan (COZAAR) 50 MG tablet Take 50 mg by mouth daily.   Yes Historical Provider, MD  niacin (NIASPAN) 500 MG CR tablet Take 500 mg by mouth at bedtime.   Yes Historical Provider, MD  pantoprazole (PROTONIX) 40 MG tablet Take 40 mg by mouth daily. 09/25/14  Yes Historical Provider, MD  Pitavastatin Calcium (LIVALO) 2 MG TABS Take 2 mg by mouth See admin instructions. Every 3rd day   Yes Historical Provider, MD  tiotropium (SPIRIVA) 18 MCG inhalation capsule Place 1 capsule (18 mcg total) into inhaler and inhale daily. Patient not taking: Reported on 02/26/2015 05/14/14   Brand Males, MD     Vital Signs: BP 167/71 mmHg  Pulse 98  Temp(Src) 97.4 F (36.3 C) (Oral)  Resp 31  Ht 5\' 6"  (1.676 m)  Wt 200 lb 4.8 oz (90.855 kg)  BMI 32.34 kg/m2  SpO2 99%  Physical Exam   Awake and Alert Abdomen Soft, NT Drain in place, no erythema at drain site. Blood tinged serous drainage in bag, no bilious drainage seen. ~45 mls output  Imaging: Ct Abdomen Pelvis Wo  Contrast  05/01/2015   CLINICAL DATA:  81 year old male with right-sided abdominal pain. Concern for sepsis  EXAM: CT ABDOMEN AND PELVIS WITHOUT CONTRAST  TECHNIQUE: Multidetector CT imaging of the abdomen and pelvis was performed following the standard protocol without IV contrast.  COMPARISON:  CT dated 10/11/2013  FINDINGS: Evaluation of this exam is limited in the absence of intravenous contrast. Evaluation is also limited due to respiratory motion artifact.  There are emphysematous changes of the lungs. Patchy subsegmental right lung base densities may represent atelectatic changes. Pneumonia is less likely but not excluded. Clinical correlation is recommended. There is coronary vascular calcification. No intra-abdominal free air. Trace perihepatic free fluid.  A 4.5 cm grossly stable appearing hypodense lesion in the left lobe of the liver is not characterized on this study but characterized as a cyst on the prior study. There is stranding of the fat surrounding the gallbladder. There are areas of apparent thickening of the gallbladder wall. No calcified gallstone identified. Findings are concerning for an acute cholecystitis. Ultrasound is recommended for better evaluation of the gallbladder. The pancreas, spleen, and adrenal glands appear unremarkable. There are multiple right renal hypodense lesion with the largest measuring approximately 8 cm in the inferior pole of the right kidney. These lesions demonstrate fluid attenuation and likely represent cysts. Characterization is however limited on this noncontrast study. There is no hydronephrosis or  nephrolithiasis on either side. The visualized ureters and urinary bladder appear unremarkable. The prostate gland is grossly unremarkable. Coarse calcification of the prostate noted.  There is extensive diverticulosis of the sigmoid colon as well as scattered colonic diverticula without active inflammation. There is no evidence of bowel obstruction. Normal  appendix.  There is aortoiliac atherosclerotic disease. 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 There is no lymphadenopathy. There is a 8 cm defect in the midline upper peritoneum with partial herniation of a segment of transverse colon. No evidence of obstruction or inflammation. This finding is new compared to the prior study. Midline vertical anterior abdominal wall incisional scar is noted. Anterior pelvic surgical scar is also noted. No fluid collection identified. There is degenerative changes of the spine. There is old healed fracture with chronic deformity of the left pubic bone and stable diastases of the symphysis pubis. No acute fracture identified. Stable L5 sclerotic focus likely represents an enostosis.  IMPRESSION: Apparent inflammatory changes of the fat surrounding the gallbladder concerning for acute cholecystitis. Ultrasound is recommended for better evaluation of the gallbladder.  Broad-based ventral hernia containing a short segment of the transverse colon without evidence of obstruction or inflammation.  Diverticulosis. No evidence of bowel obstruction or inflammation the   Electronically Signed   By: Anner Crete M.D.   On: 05/01/2015 01:55   Dg Chest 2 View  04/30/2015   CLINICAL DATA:  Acute onset of fever up to 100.4 degrees F which began yesterday. Patient was found to have leukocytosis with a white blood cell [place] of [phone] and was hypoxic at his primary provider's office yesterday.  EXAM: CHEST  2 VIEW  COMPARISON:  CT chest 09/17/2012.  Two-view chest x-ray 08/06/2012.  FINDINGS: AP semi-erect and lateral images were obtained. Cardiac silhouette normal in size, unchanged. Thoracic aorta atherosclerotic and mildly tortuous, unchanged. Hilar and mediastinal contours otherwise unremarkable. Suboptimal inspiration with linear atelectasis in the left lower lobe, lingula and right middle lobe. Emphysematous changes in the upper lobes and  mild to moderate central peribronchial thickening, unchanged. Dense consolidation in the lateral right lower lobe. No pleural effusions. Degenerative changes involving the thoracic and visualized upper lumbar spine. Old healed fracture involving the right clavicle.  IMPRESSION: 1. Dense consolidation in the lateral right lower lobe, likely a combination of acute atelectasis and pneumonia. 2. Suboptimal inspiration with linear atelectasis in the left lower lobe, lingula and right middle lobe.   Electronically Signed   By: Evangeline Dakin M.D.   On: 04/30/2015 20:09   Nm Hepatobiliary Liver Func  05/01/2015   CLINICAL DATA:  Abdominal pain.  Abnormal CT.  EXAM: NUCLEAR MEDICINE HEPATOBILIARY IMAGING  TECHNIQUE: Sequential images of the abdomen were obtained out to 60 minutes following intravenous administration of radiopharmaceutical. 3.6 mg of morphine administered IV  RADIOPHARMACEUTICALS:  5.2 MCi Tc-73m  Choletec IV  COMPARISON:  None.  FINDINGS: Liver, biliary system, bowel visualize normally. The gallbladder is not definitely identified. Morphine administered again without gallbladder visualization. These findings suggest cholecystitis. Gallbladder ultrasound should be considered for further evaluation.  IMPRESSION: Findings suggesting cholecystitis.   Electronically Signed   By: Marcello Moores  Register   On: 05/01/2015 13:26   Ir Perc Cholecystostomy  05/01/2015   INDICATION: 81 year old male with a history of acute cholecystitis  EXAM: ULTRASOUND AND FLUOROSCOPIC-GUIDED CHOLECYSTOSTOMY TUBE PLACEMENT  COMPARISON:  CT 05/01/2015  MEDICATIONS: Fentanyl 75 mcg IV; Versed 2.0 mg IV; 500 mg Levaquin  ANESTHESIA/SEDATION: Total Moderate Sedation Time  Twenty-five minutes  CONTRAST:  11mL OMNIPAQUE IOHEXOL 300 MG/ML  SOLN  FLUOROSCOPY TIME:  54 seconds, 0 minutes  COMPLICATIONS: None  PROCEDURE: Informed written consent was obtained from the patient and the patient's family after a discussion of the risks, benefits and  alternatives to treatment. Questions regarding the procedure were encouraged and answered. A timeout was performed prior to the initiation of the procedure.  The right upper abdominal quadrant was prepped and draped in the usual sterile fashion, and a sterile drape was applied covering the operative field. Maximum barrier sterile technique with sterile gowns and gloves were used for the procedure. A timeout was performed prior to the initiation of the procedure. Local anesthesia was provided with 1% lidocaine with epinephrine.  Ultrasound scanning of the right upper quadrant demonstrates a markedly dilated gallbladder. A transhepatic approach was attempted, however, given the superiorly positioned gallbladder, there is a concern for transgressing the diaphragm, and a rib space inferiorly was selected for puncture. This lower puncture may or may not represent a transhepatic approach. A 22 gauge needle was advanced into the gallbladder under direct ultrasound guidance. An ultrasound image was saved for documentation purposes. Appropriate intraluminal puncture was confirmed with the efflux of bile and advancement of an 0.018 wire into the gallbladder lumen. The needle was exchanged for an [place] set. A small amount of contrast was injected to confirm appropriate intraluminal positioning. Over a Benson wire, a 9.2-French Cook cholecystomy tube was advanced into the gallbladder fossa, coiled and locked. Bile was aspirated and a small amount of contrast was injected as several post procedural spot radiographic images were obtained in various obliquities. The catheter was secured to the skin with suture, connected to a drainage bag and a dressing was placed. The patient tolerated the procedure well without immediate post procedural complication.  IMPRESSION: Status post image guided percutaneous cholecystostomy. Sample sent to the lab for analysis.  Signed,  Dulcy Fanny. Earleen Newport, DO  [facility]  [facility]   Electronically Signed   By: Corrie Mckusick D.O.   On: 05/01/2015 18:11   Dg Chest Port 1 View  05/02/2015   CLINICAL DATA:  Acute respiratory failure, hypoxia  EXAM: PORTABLE CHEST - 1 VIEW  COMPARISON:  04/30/2015  FINDINGS: Heart size at upper limits of normal. Lungs are hypoaerated with crowding of the bronchovascular markings. Increased interstitial markings and patchy bibasilar airspace opacities are noted. Increased small pleural effusions. No pneumothorax. Pigtail catheter projects over the right upper quadrant.  IMPRESSION: Borderline cardiomegaly with increased interstitial markings which may indicate development of interstitial edema.  New patchy bibasilar airspace opacities could indicate atelectasis given the low lung volumes although pneumonia or asymmetric edema could appear similar.   Electronically Signed   By: Conchita Paris M.D.   On: 05/02/2015 07:35    Labs:  CBC:  Recent Labs  04/30/15 1928 05/01/15 0020 05/01/15 0615 05/02/15 0142  WBC 26.4* 22.7* 23.2* 22.8*  HGB 14.5 13.8 13.4 13.9  HCT 41.1 40.2 38.7* 40.0  PLT 242 223 217 228    COAGS:  Recent Labs  05/01/15 0020  INR 1.31  APTT 36    BMP:  Recent Labs  04/30/15 1928 05/01/15 0020 05/01/15 0615 05/02/15 0142  NA 127*  --  134* 132*  K 3.6  --  3.3* 3.6  CL 96*  --  105 104  CO2 19*  --  21* 19*  GLUCOSE 140*  --  124* 161*  BUN 16  --  10 12  CALCIUM 8.8*  --  8.2* 8.3*  CREATININE 1.28* 1.14 0.87 0.85  GFRNONAA 50* 58* >60 >60  GFRAA 58* >60 >60 >60    LIVER FUNCTION TESTS:  Recent Labs  04/30/15 1928 05/01/15 0615 05/02/15 0142  BILITOT 1.4* 1.5* 1.2  AST 46* 39 46*  ALT 42 34 36  ALKPHOS 68 57 61  PROT 7.2 5.9* 6.0*  ALBUMIN 3.2* 2.7* 2.5*    Assessment and Plan:  Acute Cholecystitis S/P Perc Chole Drain placed 05/01/15 by Dr. Earleen Newport Continue current care Can advance diet from our standpoint.  Signed: Murrell Redden PA-C 05/02/2015,  9:47 AM   I spent a total of 15 Minutes at the the patient's bedside AND on the patient's hospital floor or unit, greater than 50% of which was counseling/coordinating care for f/u after perc chole.

## 2015-05-03 LAB — COMPREHENSIVE METABOLIC PANEL
ALBUMIN: 2.3 g/dL — AB (ref 3.5–5.0)
ALK PHOS: 65 U/L (ref 38–126)
ALT: 85 U/L — ABNORMAL HIGH (ref 17–63)
AST: 99 U/L — AB (ref 15–41)
Anion gap: 10 (ref 5–15)
BILIRUBIN TOTAL: 1.2 mg/dL (ref 0.3–1.2)
BUN: 19 mg/dL (ref 6–20)
CALCIUM: 8.8 mg/dL — AB (ref 8.9–10.3)
CO2: 23 mmol/L (ref 22–32)
Chloride: 107 mmol/L (ref 101–111)
Creatinine, Ser: 0.87 mg/dL (ref 0.61–1.24)
GFR calc Af Amer: >60 mL/min (ref >60)
GLUCOSE: 148 mg/dL — AB (ref 65–99)
POTASSIUM: 3.2 mmol/L — AB (ref 3.5–5.1)
Sodium: 140 mmol/L (ref 135–145)
TOTAL PROTEIN: 5.8 g/dL — AB (ref 6.5–8.1)

## 2015-05-03 LAB — URINE CULTURE: Culture: >=100000

## 2015-05-03 LAB — CBC
HCT: 38.5 % — ABNORMAL LOW (ref 39.0–52.0)
Hemoglobin: 13.6 g/dL (ref 13.0–17.0)
MCH: 31.5 pg (ref 26.0–34.0)
MCHC: 35.3 g/dL (ref 30.0–36.0)
MCV: 89.1 fL (ref 78.0–100.0)
PLATELETS: 260 10*3/uL (ref 150–400)
RBC: 4.32 MIL/uL (ref 4.22–5.81)
RDW: 13.3 % (ref 11.5–15.5)
WBC: 21.8 10*3/uL — AB (ref 4.0–10.5)

## 2015-05-03 MED ORDER — GUAIFENESIN-DM 100-10 MG/5ML PO SYRP: 5.0000 mL | ORAL_SOLUTION | ORAL | Status: DC | PRN

## 2015-05-03 MED ORDER — FUROSEMIDE 10 MG/ML IJ SOLN
40.0000 mg | Freq: Two times a day (BID) | INTRAMUSCULAR | Status: DC
  Administered 2015-05-03 (×2): 40 mg via INTRAVENOUS
  Filled 2015-05-03 (×2): qty 4

## 2015-05-03 MED ORDER — POTASSIUM CHLORIDE CRYS ER 20 MEQ PO TBCR
40.0000 meq | EXTENDED_RELEASE_TABLET | Freq: Once | ORAL | Status: AC
  Administered 2015-05-03: 40 meq via ORAL
  Filled 2015-05-03: qty 2

## 2015-05-03 NOTE — Progress Notes (Signed)
[facility]/Lee [name] Progress Note    Subjective: The patient states that he feels better since the placement of his percutaneous cholecystostomy tube.  There has been minimal drainage from the tube, and there is no bile in the bag.  Objective: Vital signs in last 24 hours: Temp:  [97.3 F (36.3 C)-98.5 F (36.9 C)] 98 F (36.7 C) (09/11 0006) Pulse Rate:  [96-112] 96 (09/11 0400) Resp:  [19-33] 28 (09/11 0400) BP: (122-167)/(50-87) 152/63 mmHg (09/11 0400) SpO2:  [91 %-99 %] 91 % (09/11 0400) FiO2 (%):  [50 %] 50 % (09/10 1200) Weight:  [90.266 kg (199 lb)] 90.266 kg (199 lb) (09/11 0500) Last BM Date: 04/30/15  Intake/Output from previous day: 09/10 0701 - 09/11 0700 In: 86 [P.O.:590; IV Piggyback:300] Out: 1800 [Urine:1750; Drains:50] Intake/Output this shift:    General: No acute distress.  Wants to eat.  Lungs: Clear  Abd: Great bowel sounds.  Completely non tender.  Drain in placw without bilious drainage.  Extremities: No changes  Neuro: Intact  Lab Results:  @LABLAST2 (wbc:2,hgb:2,hct:2,plt:2) BMET ) Recent Labs  05/02/15 0142 05/03/15 0304  NA 132* 140  K 3.6 3.2*  CL 104 107  CO2 19* 23  GLUCOSE 161* 148*  BUN 12 19  CREATININE 0.85 0.87  CALCIUM 8.3* 8.8*   PT/INR  Recent Labs  05/01/15 0020  LABPROT 16.4*  INR 1.31   ABG  Recent Labs  04/30/15 2312 05/01/15 2311  PHART 7.387 7.432  HCO3 17.8* 17.8*    Studies/Results: Nm Hepatobiliary Liver Func  05/01/2015   CLINICAL DATA:  Abdominal pain.  Abnormal CT.  EXAM: NUCLEAR MEDICINE HEPATOBILIARY IMAGING  TECHNIQUE: Sequential images of the abdomen were obtained out to 60 minutes following intravenous administration of radiopharmaceutical. 3.6 mg of morphine administered IV  RADIOPHARMACEUTICALS:  5.2 MCi Tc-89m  Choletec IV  COMPARISON:  None.  FINDINGS: Liver, biliary system, bowel visualize normally. The gallbladder is not definitely identified. Morphine administered again without gallbladder  visualization. These findings suggest cholecystitis. Gallbladder ultrasound should be considered for further evaluation.  IMPRESSION: Findings suggesting cholecystitis.   Electronically Signed   By: Marcello Moores  Register   On: 05/01/2015 13:26   Ir Perc Cholecystostomy  05/01/2015   INDICATION: 81 year old male with a history of acute cholecystitis  EXAM: ULTRASOUND AND FLUOROSCOPIC-GUIDED CHOLECYSTOSTOMY TUBE PLACEMENT  COMPARISON:  CT 05/01/2015  MEDICATIONS: Fentanyl 75 mcg IV; Versed 2.0 mg IV; 500 mg Levaquin  ANESTHESIA/SEDATION: Total Moderate Sedation Time  Twenty-five minutes  CONTRAST:  53mL OMNIPAQUE IOHEXOL 300 MG/ML  SOLN  FLUOROSCOPY TIME:  54 seconds, 0 minutes  COMPLICATIONS: None  PROCEDURE: Informed written consent was obtained from the patient and the patient's family after a discussion of the risks, benefits and alternatives to treatment. Questions regarding the procedure were encouraged and answered. A timeout was performed prior to the initiation of the procedure.  The right upper abdominal quadrant was prepped and draped in the usual sterile fashion, and a sterile drape was applied covering the operative field. Maximum barrier sterile technique with sterile gowns and gloves were used for the procedure. A timeout was performed prior to the initiation of the procedure. Local anesthesia was provided with 1% lidocaine with epinephrine.  Ultrasound scanning of the right upper quadrant demonstrates a markedly dilated gallbladder. A transhepatic approach was attempted, however, given the superiorly positioned gallbladder, there is a concern for transgressing the diaphragm, and a rib space inferiorly was selected for puncture. This lower puncture may or may not represent a transhepatic approach. A  22 gauge needle was advanced into the gallbladder under direct ultrasound guidance. An ultrasound image was saved for documentation purposes. Appropriate intraluminal puncture was confirmed with the efflux of  bile and advancement of an 0.018 wire into the gallbladder lumen. The needle was exchanged for an [place] set. A small amount of contrast was injected to confirm appropriate intraluminal positioning. Over a Benson wire, a 64.2-French Cook cholecystomy tube was advanced into the gallbladder fossa, coiled and locked. Bile was aspirated and a small amount of contrast was injected as several post procedural spot radiographic images were obtained in various obliquities. The catheter was secured to the skin with suture, connected to a drainage bag and a dressing was placed. The patient tolerated the procedure well without immediate post procedural complication.  IMPRESSION: Status post image guided percutaneous cholecystostomy. Sample sent to the lab for analysis.  Signed,  Dulcy Fanny. Earleen Newport, DO  [facility]  [facility]   Electronically Signed   By: Corrie Mckusick D.O.   On: 05/01/2015 18:11   Dg Chest Port 1 View  05/02/2015   CLINICAL DATA:  Acute respiratory failure, hypoxia  EXAM: PORTABLE CHEST - 1 VIEW  COMPARISON:  04/30/2015  FINDINGS: Heart size at upper limits of normal. Lungs are hypoaerated with crowding of the bronchovascular markings. Increased interstitial markings and patchy bibasilar airspace opacities are noted. Increased small pleural effusions. No pneumothorax. Pigtail catheter projects over the right upper quadrant.  IMPRESSION: Borderline cardiomegaly with increased interstitial markings which may indicate development of interstitial edema.  New patchy bibasilar airspace opacities could indicate atelectasis given the low lung volumes although pneumonia or asymmetric edema could appear similar.   Electronically Signed   By: Conchita Paris M.D.   On: 05/02/2015 07:35    Anti-infectives: Anti-infectives    Start     Dose/Rate Route Frequency Ordered Stop   05/01/15 2200  cefTRIAXone (ROCEPHIN) 1 g in dextrose 5 % 50 mL IVPB  Status:  Discontinued      1 g 100 mL/hr over 30 Minutes Intravenous Every 24 hours 04/30/15 2352 05/01/15 0648   05/01/15 2200  azithromycin (ZITHROMAX) 500 mg in dextrose 5 % 250 mL IVPB  Status:  Discontinued     500 mg 250 mL/hr over 60 Minutes Intravenous Every 24 hours 04/30/15 2352 05/02/15 1518   05/01/15 1730  levofloxacin (LEVAQUIN) IVPB 500 mg     500 mg 100 mL/hr over 60 Minutes Intravenous  Once 05/01/15 1727 05/01/15 1849   05/01/15 1400  piperacillin-tazobactam (ZOSYN) IVPB 3.375 g     3.375 g 12.5 mL/hr over 240 Minutes Intravenous Every 8 hours 05/01/15 0704     05/01/15 0800  vancomycin (VANCOCIN) IVPB 750 mg/150 ml premix  Status:  Discontinued     750 mg 150 mL/hr over 60 Minutes Intravenous Every 12 hours 05/01/15 0704 05/02/15 1518   05/01/15 0715  piperacillin-tazobactam (ZOSYN) IVPB 3.375 g     3.375 g 100 mL/hr over 30 Minutes Intravenous  Once 05/01/15 0704 05/01/15 0850   04/30/15 2045  cefTRIAXone (ROCEPHIN) 1 g in dextrose 5 % 50 mL IVPB     1 g 100 mL/hr over 30 Minutes Intravenous  Once 04/30/15 2030 04/30/15 2217   04/30/15 2030  piperacillin-tazobactam (ZOSYN) IVPB 3.375 g  Status:  Discontinued     3.375 g 100 mL/hr over 30 Minutes Intravenous  Once 04/30/15 2028 04/30/15 2029   04/30/15 2030  azithromycin (ZITHROMAX) 500 mg in dextrose 5 % 250  mL IVPB     500 mg 250 mL/hr over 60 Minutes Intravenous  Once 04/30/15 2030 04/30/15 2217      Assessment/Plan: s/p  Advance diet  LOS: 3 days   Kathryne Eriksson. Dahlia Bailiff, MD, FACS [phone] [phone] [facility] 05/03/2015

## 2015-05-03 NOTE — Progress Notes (Signed)
[facility] PROGRESS NOTE  BRINLEY TREANOR HWE:[id number] DOB: 1931-04-05 DOA: 04/30/2015 PCP: Jerlyn Ly, MD  Brief Summary  Lee [name] is a 81 y.o. male with history of interstitial lung disease, abdominal aortic aneurysm status post repair, hyperlipidemia, hypertension, left foot drop was advised to come to the ER after patient's blood work showed leukocytosis. Patient is ago was at [facility] when patient developed abdominal pain and CT abdomen and pelvis without contrast which can be traced in care everywhere was unremarkable. As per the family patient's lipase is mildly elevated and was advised to follow-up. Patient had gone to his PCP 2 days ago and had blood work which showed leukocytosis and patient was advised to come to the ER. Lab works done in the ER shows WBC count around 26,000 and patient was still complaining of abdominal pain which is mostly in the epigastric area radiating to the back. As per patient's wife patient has significant pain yesterday and patient has largely stayed on the recliner. Denies any nausea vomiting or diarrhea. Since today morning patient has been having increasing shortness of breath with productive cough. Chest x-ray shows infiltrate concerning for pneumonia. Blood cultures has been obtained and patient has been admitted for sepsis and further workup for abdominal pain.   Assessment/Plan  Sepsis secondary to acute cholecystitis and possible CAP and UTI, heart rate trending down -  Continue Zosyn pending results of gallbladder fluid culture -  Urine culture growing citrobacter (sensitivities below) -  Blood cultures no growth to date 2 days -  Culture from percutaneous cholecystostomy drain growing GNR  Acute cholecystitis, no bile in bag.  Could imply obstructive stone or persistent significant swelling -  Continue antibiotics as above -  Cholecystostomy drain placed on 9/9 by interventional radiology -  Plan for follow-up with  IR drain clinic in 6 weeks -  Appreciate general surgery assistance -  If patient has obstructing stone, he will eventually need a laparoscopic cholecystectomy  Acute hypoxic respiratory failure due to acute COPD exacerbation and acute systolic heart failure. -  Continue xopenex -  Continue spiriva -  Continue steroids -  Continue antibiotics  Acute systolic heart failure ejection fraction of 40-45% and evidence of pulmonary edema on chest x-ray. -  Daily weights:  stable -  Strict ins and outs:  -762mL -  Start Lasix 40 mg IV BID -  Beta blocker and ARB per cardiology, appreciate assistance  Elevated troponin.  Probable demand ischemia due to sepsis and tachycardia.   Troponins approximately flat.  Has new wall motion abnormality on echocardiogram and has new interventricular conduction delay versus new left bundle branch block on EKG compared to prior from several years ago. -  Continue aspirin, statin, beta blocker  -  Possible stress test once clinically improved  Sinus tachycardia, likely due to sepsis, trending down with antibiotics -  TSH and fT4 within normal limits -  D-dimer likely to be elevated in the setting of infection and we already have more likely explanation for his tachycardia and SOB a  Hypertension, blood pressure improving with carvedilol, losartan  AKI due to sepsis/dehydration, resolved with IVF  Chronic left lower extremity weakness, stable  Leukocytosis likely secondary to sepsis and stable -  Continue antibiotics and IVF  Hyponatremia resolved with diuresis  Mild transaminitis may be secondary to recent cholecystostomy drain placement -  If continuing to rise, will get right upper quadrant ultrasound  Diet:  Healthy heart Access:  PIV IVF:  Off Proph: Lovenox   Code Status:  DNR per discussion with the patient and the son who was at bedside who witnessed the conversation on 9/9 Family Communication: patient alone Disposition Plan:  Pending  improvement in breathing, PT/OT evaluation, afebrile and WBC at least trending down.  Anticipate SNF at discharge.     Consultants:  General surgery  Procedures:  CT abd/pelvis  HIDA pending  ECHO  Antibiotics:  vanco 9/9  Zosyn 9/9    azithro 9/8   HPI/Subjective:  Continues to have some chest tightness and wheezing that are about the same as yesterday.  Denies nausea, vomiting.  Has some RUQ pain where drain is when he moves  Objective: Filed Vitals:   05/03/15 0800 05/03/15 1100 05/03/15 1200 05/03/15 1636  BP: 134/61 132/80 123/73 108/67  Pulse: 93 93 93 98  Temp: 98.4 F (36.9 C) 97.1 F (36.2 C)  97.6 F (36.4 C)  TempSrc: Oral Oral  Oral  Resp: 25 22 24 21   Height:      Weight:      SpO2: 91% 92% 89% 93%    Intake/Output Summary (Last 24 hours) at 05/03/15 1731 Last data filed at 05/03/15 1432  Gross per 24 hour  Intake    800 ml  Output   1750 ml  Net   -950 ml   Filed Weights   05/01/15 0500 05/02/15 0500 05/03/15 0500  Weight: 90.447 kg (199 lb 6.4 oz) 90.855 kg (200 lb 4.8 oz) 90.266 kg (199 lb)   Body mass index is 32.13 kg/(m^2).  Exam:   General:  Obese male, mild respiratory distress with SCM retractions  HEENT:  NCAT, MMM  Cardiovascular:  RRR, nl S1, S2 no mrg, 2+ pulses, warm extremities  Respiratory:  full expiratory wheeze, rhonchorous cough, diminished at bases  Abdomen:   NABS, soft, TTP in the RUQ, drain in place with semipurulent material mixed with serosanguinous material, no bile  MSK:   Normal tone and bulk, no LEE except for left foot  Neuro:  Grossly intact except HOH and has left foot drop  Data Reviewed: Basic Metabolic Panel:  Recent Labs Lab 04/30/15 1928 05/01/15 0020 05/01/15 0615 05/02/15 0142 05/03/15 0304  NA 127*  --  134* 132* 140  K 3.6  --  3.3* 3.6 3.2*  CL 96*  --  105 104 107  CO2 19*  --  21* 19* 23  GLUCOSE 140*  --  124* 161* 148*  BUN 16  --  10 12 19   CREATININE 1.28* 1.14 0.87  0.85 0.87  CALCIUM 8.8*  --  8.2* 8.3* 8.8*   Liver Function Tests:  Recent Labs Lab 04/30/15 1928 05/01/15 0615 05/02/15 0142 05/03/15 0304  AST 46* 39 46* 99*  ALT 42 34 36 85*  ALKPHOS 68 57 61 65  BILITOT 1.4* 1.5* 1.2 1.2  PROT 7.2 5.9* 6.0* 5.8*  ALBUMIN 3.2* 2.7* 2.5* 2.3*    Recent Labs Lab 05/01/15 0020  LIPASE 24   No results for input(s): AMMONIA in the last 168 hours. CBC:  Recent Labs Lab 04/30/15 1928 05/01/15 0020 05/01/15 0615 05/02/15 0142 05/03/15 0304  WBC 26.4* 22.7* 23.2* 22.8* 21.8*  NEUTROABS 22.2*  --  20.1*  --   --   HGB 14.5 13.8 13.4 13.9 13.6  HCT 41.1 40.2 38.7* 40.0 38.5*  MCV 88.6 90.1 89.4 89.5 89.1  PLT 242 223 217 228 260    Recent Results (from the past 240 hour(s))  MRSA PCR Screening     Status: Abnormal   Collection Time: 04/30/15 12:00 AM  Result Value Ref Range Status   MRSA by PCR POSITIVE (A) NEGATIVE Final    Comment:        The GeneXpert MRSA Assay (FDA approved for NASAL specimens only), is one component of a comprehensive MRSA colonization surveillance program. It is not intended to diagnose MRSA infection nor to guide or monitor treatment for MRSA infections. RESULT CALLED TO, READ BACK BY AND VERIFIED WITH: A GUSSEY@0218  05/01/15 MKELLY   Culture, blood (routine x 2)     Status: None (Preliminary result)   Collection Time: 04/30/15  8:36 PM  Result Value Ref Range Status   Specimen Description BLOOD RIGHT FOREARM  Final   Special Requests BOTTLES DRAWN AEROBIC AND ANAEROBIC 5CC  Final   Culture NO GROWTH 3 DAYS  Final   Report Status PENDING  Incomplete  Culture, blood (routine x 2)     Status: None (Preliminary result)   Collection Time: 04/30/15  8:42 PM  Result Value Ref Range Status   Specimen Description BLOOD LEFT ARM  Final   Special Requests BOTTLES DRAWN AEROBIC AND ANAEROBIC 5CC  Final   Culture NO GROWTH 3 DAYS  Final   Report Status PENDING  Incomplete  Urine culture     Status: None    Collection Time: 04/30/15  9:23 PM  Result Value Ref Range Status   Specimen Description URINE, CLEAN CATCH  Final   Special Requests NONE  Final   Culture >=100,000 COLONIES/mL CITROBACTER KOSERI  Final   Report Status 05/03/2015 FINAL  Final   Organism ID, Bacteria CITROBACTER KOSERI  Final      Susceptibility   Citrobacter koseri - MIC*    CEFAZOLIN <=4 SENSITIVE Sensitive     CEFTRIAXONE <=1 SENSITIVE Sensitive     CIPROFLOXACIN <=0.25 SENSITIVE Sensitive     GENTAMICIN <=1 SENSITIVE Sensitive     IMIPENEM <=0.25 SENSITIVE Sensitive     NITROFURANTOIN 32 SENSITIVE Sensitive     TRIMETH/SULFA <=20 SENSITIVE Sensitive     PIP/TAZO <=4 SENSITIVE Sensitive     * >=100,000 COLONIES/mL CITROBACTER KOSERI  Culture, routine-abscess     Status: None (Preliminary result)   Collection Time: 05/01/15  6:55 PM  Result Value Ref Range Status   Specimen Description ABSCESS  Final   Special Requests GALLBLADDER  Final   Gram Stain   Final    ABUNDANT WBC PRESENT,BOTH PMN AND MONONUCLEAR NO SQUAMOUS EPITHELIAL CELLS SEEN FEW GRAM NEGATIVE RODS Performed at [facility]    Culture   Final    ABUNDANT GRAM NEGATIVE RODS Performed at [facility]    Report Status PENDING  Incomplete     Studies: Ir Perc Cholecystostomy  05/01/2015   INDICATION: 81 year old male with a history of acute cholecystitis  EXAM: ULTRASOUND AND FLUOROSCOPIC-GUIDED CHOLECYSTOSTOMY TUBE PLACEMENT  COMPARISON:  CT 05/01/2015  MEDICATIONS: Fentanyl 75 mcg IV; Versed 2.0 mg IV; 500 mg Levaquin  ANESTHESIA/SEDATION: Total Moderate Sedation Time  Twenty-five minutes  CONTRAST:  67mL OMNIPAQUE IOHEXOL 300 MG/ML  SOLN  FLUOROSCOPY TIME:  54 seconds, 0 minutes  COMPLICATIONS: None  PROCEDURE: Informed written consent was obtained from the patient and the patient's family after a discussion of the risks, benefits and alternatives to treatment. Questions regarding the procedure were encouraged and answered. A  timeout was performed prior to the initiation of the procedure.  The right upper abdominal quadrant was prepped and  draped in the usual sterile fashion, and a sterile drape was applied covering the operative field. Maximum barrier sterile technique with sterile gowns and gloves were used for the procedure. A timeout was performed prior to the initiation of the procedure. Local anesthesia was provided with 1% lidocaine with epinephrine.  Ultrasound scanning of the right upper quadrant demonstrates a markedly dilated gallbladder. A transhepatic approach was attempted, however, given the superiorly positioned gallbladder, there is a concern for transgressing the diaphragm, and a rib space inferiorly was selected for puncture. This lower puncture may or may not represent a transhepatic approach. A 22 gauge needle was advanced into the gallbladder under direct ultrasound guidance. An ultrasound image was saved for documentation purposes. Appropriate intraluminal puncture was confirmed with the efflux of bile and advancement of an 0.018 wire into the gallbladder lumen. The needle was exchanged for an [place] set. A small amount of contrast was injected to confirm appropriate intraluminal positioning. Over a Benson wire, a 74.2-French Cook cholecystomy tube was advanced into the gallbladder fossa, coiled and locked. Bile was aspirated and a small amount of contrast was injected as several post procedural spot radiographic images were obtained in various obliquities. The catheter was secured to the skin with suture, connected to a drainage bag and a dressing was placed. The patient tolerated the procedure well without immediate post procedural complication.  IMPRESSION: Status post image guided percutaneous cholecystostomy. Sample sent to the lab for analysis.  Signed,  Dulcy Fanny. Earleen Newport, DO  [facility]  [facility]   Electronically Signed   By: Corrie Mckusick D.O.   On:  05/01/2015 18:11   Dg Chest Port 1 View  05/02/2015   CLINICAL DATA:  Acute respiratory failure, hypoxia  EXAM: PORTABLE CHEST - 1 VIEW  COMPARISON:  04/30/2015  FINDINGS: Heart size at upper limits of normal. Lungs are hypoaerated with crowding of the bronchovascular markings. Increased interstitial markings and patchy bibasilar airspace opacities are noted. Increased small pleural effusions. No pneumothorax. Pigtail catheter projects over the right upper quadrant.  IMPRESSION: Borderline cardiomegaly with increased interstitial markings which may indicate development of interstitial edema.  New patchy bibasilar airspace opacities could indicate atelectasis given the low lung volumes although pneumonia or asymmetric edema could appear similar.   Electronically Signed   By: Conchita Paris M.D.   On: 05/02/2015 07:35    Scheduled Meds: . amLODipine  5 mg Oral Daily  . antiseptic oral rinse  7 mL Mouth Rinse BID  . Chlorhexidine Gluconate Cloth  6 each Topical Q0600  . enoxaparin (LOVENOX) injection  40 mg Subcutaneous Q24H  . ezetimibe  5 mg Oral Daily  . furosemide  40 mg Intravenous BID  . Influenza vac split quadrivalent PF  0.5 mL Intramuscular Tomorrow-1000  . levalbuterol  1.25 mg Nebulization Q6H  . losartan  50 mg Oral Daily  . methylPREDNISolone (SOLU-MEDROL) injection  60 mg Intravenous BID  . metoprolol tartrate  12.5 mg Oral BID  . mupirocin ointment  1 application Nasal BID  . niacin  500 mg Oral QHS  . pantoprazole  40 mg Oral Daily  . piperacillin-tazobactam (ZOSYN)  IV  3.375 g Intravenous Q8H  . pravastatin  40 mg Oral q1800  . tiotropium  18 mcg Inhalation Daily   Continuous Infusions:    Principal Problem:   Sepsis Active Problems:   IPF (idiopathic pulmonary fibrosis)   Hypertension   Acute renal failure (ARF)   Abdominal pain   Acute  respiratory failure with hypoxia   HLD (hyperlipidemia)   CAP (community acquired pneumonia)   Elevated troponin    Cholecystitis, acute   LV dysfunction   UTI (urinary tract infection)   Acute systolic congestive heart failure   Demand ischemia    Time spent: 30 min    Fransisca Shawn, [facility] Pager [phone]. If 7PM-7AM, please contact night-coverage at [email or web address], password [facility] 05/03/2015, 5:31 PM  LOS: 3 days

## 2015-05-03 NOTE — Progress Notes (Signed)
ANTIBIOTIC CONSULT NOTE - Follow Up  Pharmacy Consult for Zosyn Indication: rule out sepsis  Allergies  Allergen Reactions  . Ciprofloxacin Other (See Comments)    Tingle feeling throughout body    Patient Measurements: Height: 5\' 6"  (167.6 cm) Weight: 199 lb (90.266 kg) IBW/kg (Calculated) : 63.8  Vital Signs: Temp: 97.1 F (36.2 C) (09/11 1100) Temp Source: Oral (09/11 1100) BP: 132/80 mmHg (09/11 1100) Pulse Rate: 93 (09/11 1100)  Labs:  Recent Labs  05/01/15 0615 05/02/15 0142 05/03/15 0304  WBC 23.2* 22.8* 21.8*  HGB 13.4 13.9 13.6  PLT 217 228 260  CREATININE 0.87 0.85 0.87   Estimated Creatinine Clearance: 68.9 mL/min (by C-G formula based on Cr of 0.87).    Microbiology: Recent Results (from the past 720 hour(s))  MRSA PCR Screening     Status: Abnormal   Collection Time: 04/30/15 12:00 AM  Result Value Ref Range Status   MRSA by PCR POSITIVE (A) NEGATIVE Final    Comment:        The GeneXpert MRSA Assay (FDA approved for NASAL specimens only), is one component of a comprehensive MRSA colonization surveillance program. It is not intended to diagnose MRSA infection nor to guide or monitor treatment for MRSA infections. RESULT CALLED TO, READ BACK BY AND VERIFIED WITH: A GUSSEY@0218  05/01/15 MKELLY   Culture, blood (routine x 2)     Status: None (Preliminary result)   Collection Time: 04/30/15  8:36 PM  Result Value Ref Range Status   Specimen Description BLOOD RIGHT FOREARM  Final   Special Requests BOTTLES DRAWN AEROBIC AND ANAEROBIC 5CC  Final   Culture NO GROWTH 2 DAYS  Final   Report Status PENDING  Incomplete  Culture, blood (routine x 2)     Status: None (Preliminary result)   Collection Time: 04/30/15  8:42 PM  Result Value Ref Range Status   Specimen Description BLOOD LEFT ARM  Final   Special Requests BOTTLES DRAWN AEROBIC AND ANAEROBIC 5CC  Final   Culture NO GROWTH 2 DAYS  Final   Report Status PENDING  Incomplete  Urine culture      Status: None (Preliminary result)   Collection Time: 04/30/15  9:23 PM  Result Value Ref Range Status   Specimen Description URINE, CLEAN CATCH  Final   Special Requests NONE  Final   Culture >=100,000 COLONIES/mL GRAM NEGATIVE RODS  Final   Report Status PENDING  Incomplete  Culture, routine-abscess     Status: None (Preliminary result)   Collection Time: 05/01/15  6:55 PM  Result Value Ref Range Status   Specimen Description ABSCESS  Final   Special Requests GALLBLADDER  Final   Gram Stain   Final    ABUNDANT WBC PRESENT,BOTH PMN AND MONONUCLEAR NO SQUAMOUS EPITHELIAL CELLS SEEN FEW GRAM NEGATIVE RODS Performed at [facility]    Culture   Final    ABUNDANT GRAM NEGATIVE RODS Performed at [facility]    Report Status PENDING  Incomplete    Medical History: Past Medical History  Diagnosis Date  . Dyspnea   . HTN (hypertension)   . AAA (abdominal aortic aneurysm)   . Allergic rhinitis   . Erectile dysfunction   . Hyperlipidemia   . Obesity   . PVD (peripheral vascular disease)   . Basal cell carcinoma of face   . Acute systolic congestive heart failure 05/02/2015    Medications:  Prescriptions prior to admission  Medication Sig Dispense Refill Last Dose  .  amLODipine (NORVASC) 5 MG tablet Take 5 mg by mouth daily.   04/30/2015 at Unknown time  . clopidogrel (PLAVIX) 75 MG tablet Take 75 mg by mouth daily.   6 04/30/2015 at Unknown time  . dicyclomine (BENTYL) 20 MG tablet Take 20 mg by mouth every 6 (six) hours as needed for spasms.   04/30/2015 at Unknown time  . ezetimibe (ZETIA) 10 MG tablet Take 5 mg by mouth daily.    04/30/2015 at Unknown time  . losartan (COZAAR) 50 MG tablet Take 50 mg by mouth daily.   04/30/2015 at Unknown time  . niacin (NIASPAN) 500 MG CR tablet Take 500 mg by mouth at bedtime.   04/29/2015 at Unknown time  . pantoprazole (PROTONIX) 40 MG tablet Take 40 mg by mouth daily.  11 04/30/2015 at Unknown time  . Pitavastatin Calcium  (LIVALO) 2 MG TABS Take 2 mg by mouth See admin instructions. Every 3rd day   04/30/2015 at Unknown time  . tiotropium (SPIRIVA) 18 MCG inhalation capsule Place 1 capsule (18 mcg total) into inhaler and inhale daily. (Patient not taking: Reported on 02/26/2015) 30 capsule 0 Not Taking at Unknown time   Scheduled:  . amLODipine  5 mg Oral Daily  . antiseptic oral rinse  7 mL Mouth Rinse BID  . Chlorhexidine Gluconate Cloth  6 each Topical Q0600  . enoxaparin (LOVENOX) injection  40 mg Subcutaneous Q24H  . ezetimibe  5 mg Oral Daily  . furosemide  40 mg Intravenous BID  . Influenza vac split quadrivalent PF  0.5 mL Intramuscular Tomorrow-1000  . levalbuterol  1.25 mg Nebulization Q6H  . losartan  50 mg Oral Daily  . methylPREDNISolone (SOLU-MEDROL) injection  60 mg Intravenous BID  . metoprolol tartrate  12.5 mg Oral BID  . mupirocin ointment  1 application Nasal BID  . niacin  500 mg Oral QHS  . pantoprazole  40 mg Oral Daily  . piperacillin-tazobactam (ZOSYN)  IV  3.375 g Intravenous Q8H  . pravastatin  40 mg Oral q1800  . tiotropium  18 mcg Inhalation Daily   Infusions:     Assessment: 81yo male admitted 04/30/2015 being treated for sepsis 2/2 acute cholecystitis, poss CAP & UTI. Drain placed by IR.  ID: sepsis, cholecystitis, WBC 26> 21.8, afebrile  Azith 9/9>9/9 Vanc 9/9> 9/10 Zosyn 9/9>  9/8 urine >> GNRs 9/8 blood >> 9/9 abscess >> GNRs 9/8 MRSA screen pos  Goal of Therapy:  Vancomycin trough level 15-20 mcg/ml  Plan:  Continue Zosyn 3.375 g IV q8h Monitor clinical status, renal function F/u cx speciation and sensitivities, duration of therapy   Heloise Ochoa, Pharm.D. PGY2 Cardiology Pharmacy Resident Pager: [phone] 05/03/2015,11:29 AM
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
## 2015-05-04 ENCOUNTER — Inpatient Hospital Stay (HOSPITAL_COMMUNITY): Payer: Medicare Other

## 2015-05-04 DIAGNOSIS — R740 Nonspecific elevation of levels of transaminase and lactic acid dehydrogenase [LDH]: Secondary | ICD-10-CM

## 2015-05-04 DIAGNOSIS — I248 Other forms of acute ischemic heart disease: Secondary | ICD-10-CM

## 2015-05-04 DIAGNOSIS — I5021 Acute systolic (congestive) heart failure: Secondary | ICD-10-CM

## 2015-05-04 LAB — COMPREHENSIVE METABOLIC PANEL
ALBUMIN: 2.3 g/dL — AB (ref 3.5–5.0)
ALT: 133 U/L — ABNORMAL HIGH (ref 17–63)
ANION GAP: 8 (ref 5–15)
AST: 107 U/L — ABNORMAL HIGH (ref 15–41)
Alkaline Phosphatase: 65 U/L (ref 38–126)
BUN: 24 mg/dL — ABNORMAL HIGH (ref 6–20)
CO2: 26 mmol/L (ref 22–32)
Calcium: 8.5 mg/dL — ABNORMAL LOW (ref 8.9–10.3)
Chloride: 107 mmol/L (ref 101–111)
Creatinine, Ser: 1.1 mg/dL (ref 0.61–1.24)
GFR calc Af Amer: >60 mL/min (ref >60)
GFR calc non Af Amer: >60 mL/min (ref >60)
GLUCOSE: 154 mg/dL — AB (ref 65–99)
POTASSIUM: 3.3 mmol/L — AB (ref 3.5–5.1)
SODIUM: 141 mmol/L (ref 135–145)
TOTAL PROTEIN: 5.6 g/dL — AB (ref 6.5–8.1)
Total Bilirubin: 0.6 mg/dL (ref 0.3–1.2)

## 2015-05-04 LAB — CBC
HEMATOCRIT: 39.5 % (ref 39.0–52.0)
HEMOGLOBIN: 14 g/dL (ref 13.0–17.0)
MCH: 31.4 pg (ref 26.0–34.0)
MCHC: 35.4 g/dL (ref 30.0–36.0)
MCV: 88.6 fL (ref 78.0–100.0)
Platelets: 282 10*3/uL (ref 150–400)
RBC: 4.46 MIL/uL (ref 4.22–5.81)
RDW: 13.6 % (ref 11.5–15.5)
WBC: 16.8 10*3/uL — ABNORMAL HIGH (ref 4.0–10.5)

## 2015-05-04 LAB — LEGIONELLA ANTIGEN, URINE

## 2015-05-04 LAB — MAGNESIUM: Magnesium: 2.1 mg/dL (ref 1.7–2.4)

## 2015-05-04 MED ORDER — POTASSIUM CHLORIDE ER 10 MEQ PO TBCR: 40.0000 meq | EXTENDED_RELEASE_TABLET | Freq: Once | ORAL | Status: DC

## 2015-05-04 MED ORDER — POTASSIUM CHLORIDE ER 10 MEQ PO TBCR: 10.0000 meq | EXTENDED_RELEASE_TABLET | Freq: Once | ORAL | Status: DC

## 2015-05-04 MED ORDER — POTASSIUM CHLORIDE 10 MEQ/100ML IV SOLN
10.0000 meq | INTRAVENOUS | Status: DC
Start: 2015-05-04 — End: 2015-05-04
  Administered 2015-05-04: 10 meq via INTRAVENOUS
  Filled 2015-05-04 (×2): qty 100

## 2015-05-04 MED ORDER — POTASSIUM CHLORIDE CRYS ER 20 MEQ PO TBCR
40.0000 meq | EXTENDED_RELEASE_TABLET | Freq: Once | ORAL | Status: AC
  Administered 2015-05-04: 40 meq via ORAL
  Filled 2015-05-04: qty 2

## 2015-05-04 NOTE — Progress Notes (Signed)
OT Cancellation Note  Patient Details Name: Lee [name] MRN: [id number] DOB: 1931/02/02   Cancelled Treatment:    Reason Eval/Treat Not Completed: Other (comment) (with nsg. will see in am to assess ADL and D/C needs.)  [employer], OTR/L  [phone] 05/04/2015 05/04/2015, 4:29 PM

## 2015-05-04 NOTE — Progress Notes (Signed)
Patient ID: Lee [name], male   DOB: 06-08-1931, 81 y.o.   MRN: [id number]     [facility]      [address] [place], [place] [address]    Phone: [phone] FAX: [phone]     Subjective: No pain.  Tolerating POs.  WBC down.  Afebrile.  t bili normal, ast/alt increase some.    Objective:  Vital signs:  Filed Vitals:   05/04/15 0000 05/04/15 0149 05/04/15 0400 05/04/15 0500  BP: 139/62  166/56   Pulse: 85  85   Temp:   97.5 F (36.4 C)   TempSrc:   Oral   Resp: 24  18   Height:      Weight:    87.68 kg (193 lb 4.8 oz)  SpO2: 95% 93% 97%     Last BM Date: 05/01/15  Intake/Output   Yesterday:  09/11 0701 - 09/12 0700 In: 58 [P.O.:780; IV Piggyback:150] Out: 2410 [Urine:2150; Drains:260] This shift:    I/O last 3 completed shifts: In: 1140 [P.O.:890; IV Piggyback:250] Out: 2755 [Urine:2475; Drains:280]      Physical Exam: General: Pt awake/alert/oriented x4 in no acute distress Abdomen: Soft.  Nondistended.  Non tender.  RUQ drain with bilious output.  No evidence of peritonitis.  No incarcerated hernias.    Problem List:   Principal Problem:   Sepsis Active Problems:   IPF (idiopathic pulmonary fibrosis)   Hypertension   Acute renal failure (ARF)   Abdominal pain   Acute respiratory failure with hypoxia   HLD (hyperlipidemia)   CAP (community acquired pneumonia)   Elevated troponin   Cholecystitis, acute   LV dysfunction   UTI (urinary tract infection)   Acute systolic congestive heart failure   Demand ischemia    Results:   Labs: Results for orders placed or performed during the hospital encounter of 04/30/15 (from the past 48 hour(s))  Troponin I (q 6hr x 3)     Status: Abnormal   Collection Time: 05/02/15  8:10 AM  Result Value Ref Range   Troponin I 0.16 (H) <0.031 ng/mL    Comment:        PERSISTENTLY INCREASED TROPONIN VALUES IN THE RANGE OF 0.04-0.49 ng/mL CAN BE SEEN IN:   -UNSTABLE ANGINA       -CONGESTIVE HEART FAILURE       -MYOCARDITIS       -CHEST TRAUMA       -ARRYHTHMIAS       -LATE PRESENTING MYOCARDIAL INFARCTION       -COPD   CLINICAL FOLLOW-UP RECOMMENDED.   Lactic acid, plasma     Status: None   Collection Time: 05/02/15  8:10 AM  Result Value Ref Range   Lactic Acid, Venous 1.7 0.5 - 2.0 mmol/L  Comprehensive metabolic panel     Status: Abnormal   Collection Time: 05/03/15  3:04 AM  Result Value Ref Range   Sodium 140 135 - 145 mmol/L    Comment: DELTA CHECK NOTED   Potassium 3.2 (L) 3.5 - 5.1 mmol/L   Chloride 107 101 - 111 mmol/L   CO2 23 22 - 32 mmol/L   Glucose, Bld 148 (H) 65 - 99 mg/dL   BUN 19 6 - 20 mg/dL   Creatinine, Ser 0.87 0.61 - 1.24 mg/dL   Calcium 8.8 (L) 8.9 - 10.3 mg/dL   Total Protein 5.8 (L) 6.5 - 8.1 g/dL   Albumin 2.3 (L) 3.5 - 5.0 g/dL  AST 99 (H) 15 - 41 U/L   ALT 85 (H) 17 - 63 U/L   Alkaline Phosphatase 65 38 - 126 U/L   Total Bilirubin 1.2 0.3 - 1.2 mg/dL   GFR calc non Af Amer >60 >60 mL/min   GFR calc Af Amer >60 >60 mL/min    Comment: (NOTE) The eGFR has been calculated using the CKD EPI equation. This calculation has not been validated in all clinical situations. eGFR's persistently <60 mL/min signify possible Chronic Kidney Disease.    Anion gap 10 5 - 15  CBC     Status: Abnormal   Collection Time: 05/03/15  3:04 AM  Result Value Ref Range   WBC 21.8 (H) 4.0 - 10.5 K/uL   RBC 4.32 4.22 - 5.81 MIL/uL   Hemoglobin 13.6 13.0 - 17.0 g/dL   HCT 38.5 (L) 39.0 - 52.0 %   MCV 89.1 78.0 - 100.0 fL   MCH 31.5 26.0 - 34.0 pg   MCHC 35.3 30.0 - 36.0 g/dL   RDW 13.3 11.5 - 15.5 %   Platelets 260 150 - 400 K/uL  Comprehensive metabolic panel     Status: Abnormal   Collection Time: 05/04/15  2:45 AM  Result Value Ref Range   Sodium 141 135 - 145 mmol/L   Potassium 3.3 (L) 3.5 - 5.1 mmol/L   Chloride 107 101 - 111 mmol/L   CO2 26 22 - 32 mmol/L   Glucose, Bld 154 (H) 65 - 99 mg/dL   BUN 24 (H)  6 - 20 mg/dL   Creatinine, Ser 1.10 0.61 - 1.24 mg/dL   Calcium 8.5 (L) 8.9 - 10.3 mg/dL   Total Protein 5.6 (L) 6.5 - 8.1 g/dL   Albumin 2.3 (L) 3.5 - 5.0 g/dL   AST 107 (H) 15 - 41 U/L   ALT 133 (H) 17 - 63 U/L   Alkaline Phosphatase 65 38 - 126 U/L   Total Bilirubin 0.6 0.3 - 1.2 mg/dL   GFR calc non Af Amer >60 >60 mL/min   GFR calc Af Amer >60 >60 mL/min    Comment: (NOTE) The eGFR has been calculated using the CKD EPI equation. This calculation has not been validated in all clinical situations. eGFR's persistently <60 mL/min signify possible Chronic Kidney Disease.    Anion gap 8 5 - 15  CBC     Status: Abnormal   Collection Time: 05/04/15  2:45 AM  Result Value Ref Range   WBC 16.8 (H) 4.0 - 10.5 K/uL   RBC 4.46 4.22 - 5.81 MIL/uL   Hemoglobin 14.0 13.0 - 17.0 g/dL   HCT 39.5 39.0 - 52.0 %   MCV 88.6 78.0 - 100.0 fL   MCH 31.4 26.0 - 34.0 pg   MCHC 35.4 30.0 - 36.0 g/dL   RDW 13.6 11.5 - 15.5 %   Platelets 282 150 - 400 K/uL  Magnesium     Status: None   Collection Time: 05/04/15  7:11 AM  Result Value Ref Range   Magnesium 2.1 1.7 - 2.4 mg/dL    Imaging / Studies: No results found.  Medications / Allergies:  Scheduled Meds: . amLODipine  5 mg Oral Daily  . antiseptic oral rinse  7 mL Mouth Rinse BID  . Chlorhexidine Gluconate Cloth  6 each Topical Q0600  . enoxaparin (LOVENOX) injection  40 mg Subcutaneous Q24H  . ezetimibe  5 mg Oral Daily  . Influenza vac split quadrivalent PF  0.5 mL Intramuscular Tomorrow-1000  .  levalbuterol  1.25 mg Nebulization Q6H  . losartan  50 mg Oral Daily  . methylPREDNISolone (SOLU-MEDROL) injection  60 mg Intravenous BID  . metoprolol tartrate  12.5 mg Oral BID  . mupirocin ointment  1 application Nasal BID  . niacin  500 mg Oral QHS  . pantoprazole  40 mg Oral Daily  . piperacillin-tazobactam (ZOSYN)  IV  3.375 g Intravenous Q8H  . potassium chloride  40 mEq Oral Once  . pravastatin  40 mg Oral q1800  . tiotropium  18  mcg Inhalation Daily   Continuous Infusions:  PRN Meds:.acetaminophen **OR** acetaminophen, dicyclomine, guaiFENesin-dextromethorphan, iohexol, levalbuterol, metoprolol, ondansetron **OR** ondansetron (ZOFRAN) IV, technetium TC 76M mebrofenin  Antibiotics: Anti-infectives    Start     Dose/Rate Route Frequency Ordered Stop   05/01/15 2200  cefTRIAXone (ROCEPHIN) 1 g in dextrose 5 % 50 mL IVPB  Status:  Discontinued     1 g 100 mL/hr over 30 Minutes Intravenous Every 24 hours 04/30/15 2352 05/01/15 0648   05/01/15 2200  azithromycin (ZITHROMAX) 500 mg in dextrose 5 % 250 mL IVPB  Status:  Discontinued     500 mg 250 mL/hr over 60 Minutes Intravenous Every 24 hours 04/30/15 2352 05/02/15 1518   05/01/15 1730  levofloxacin (LEVAQUIN) IVPB 500 mg     500 mg 100 mL/hr over 60 Minutes Intravenous  Once 05/01/15 1727 05/01/15 1849   05/01/15 1400  piperacillin-tazobactam (ZOSYN) IVPB 3.375 g     3.375 g 12.5 mL/hr over 240 Minutes Intravenous Every 8 hours 05/01/15 0704     05/01/15 0800  vancomycin (VANCOCIN) IVPB 750 mg/150 ml premix  Status:  Discontinued     750 mg 150 mL/hr over 60 Minutes Intravenous Every 12 hours 05/01/15 0704 05/02/15 1518   05/01/15 0715  piperacillin-tazobactam (ZOSYN) IVPB 3.375 g     3.375 g 100 mL/hr over 30 Minutes Intravenous  Once 05/01/15 0704 05/01/15 0850   04/30/15 2045  cefTRIAXone (ROCEPHIN) 1 g in dextrose 5 % 50 mL IVPB     1 g 100 mL/hr over 30 Minutes Intravenous  Once 04/30/15 2030 04/30/15 2217   04/30/15 2030  piperacillin-tazobactam (ZOSYN) IVPB 3.375 g  Status:  Discontinued     3.375 g 100 mL/hr over 30 Minutes Intravenous  Once 04/30/15 2028 04/30/15 2029   04/30/15 2030  azithromycin (ZITHROMAX) 500 mg in dextrose 5 % 250 mL IVPB     500 mg 250 mL/hr over 60 Minutes Intravenous  Once 04/30/15 2030 04/30/15 2217        Assessment/Plan Cholecystitis s/p cholecystostomy tube-tolerating POs, bilious drain output, non tender.  D#3/7  zosyn, may transition to PO from a surgical standpoint.  Culture yielded e coli, sensitivities pending.  Continue with low fat diet.  OP f/u with Dr. Marlou Starks in 4 weeks.  Drain to remain in place for at least 6 weeks. Drain care per radiology recs.  Surgery signing off.  Please call for further assistance.   Erby Pian, [facility] Surgery Pager [phone]) For consults and floor pages call [phone])  05/04/2015 8:01 AM

## 2015-05-04 NOTE — Care Management Important Message (Signed)
Important Message  Patient Details  Name: Lee [name] MRN: [id number] Date of Birth: 04-16-1931   Medicare Important Message Given:  Yes-second notification given    Lacretia Leigh, RN 05/04/2015, 12:00 PM

## 2015-05-04 NOTE — Progress Notes (Signed)
SUBJECTIVE: The patient is doing well today.  At this time, he denies chest pain, shortness of breath, or any new concerns.  [name] amLODipine  5 mg Oral Daily  . antiseptic oral rinse  7 mL Mouth Rinse BID  . Chlorhexidine Gluconate Cloth  6 each Topical Q0600  . enoxaparin (LOVENOX) injection  40 mg Subcutaneous Q24H  . ezetimibe  5 mg Oral Daily  . Influenza vac split quadrivalent PF  0.5 mL Intramuscular Tomorrow-1000  . levalbuterol  1.25 mg Nebulization Q6H  . losartan  50 mg Oral Daily  . methylPREDNISolone (SOLU-MEDROL) injection  60 mg Intravenous BID  . metoprolol tartrate  12.5 mg Oral BID  . mupirocin ointment  1 application Nasal BID  . niacin  500 mg Oral QHS  . pantoprazole  40 mg Oral Daily  . piperacillin-tazobactam (ZOSYN)  IV  3.375 g Intravenous Q8H  . potassium chloride  40 mEq Oral Once  . pravastatin  40 mg Oral q1800  . tiotropium  18 mcg Inhalation Daily      OBJECTIVE: Physical Exam: Filed Vitals:   05/04/15 0149 05/04/15 0400 05/04/15 0500 05/04/15 0802  BP:  166/56  162/65  Pulse:  85  92  Temp:  97.5 F (36.4 C)  97.7 F (36.5 C)  TempSrc:  Oral  Oral  Resp:  18    Height:      Weight:   87.68 kg (193 lb 4.8 oz)   SpO2: 93% 97%  96%    Intake/Output Summary (Last 24 hours) at 05/04/15 0831 Last data filed at 05/04/15 0600  Gross per 24 hour  Intake    930 ml  Output   2310 ml  Net  -1380 ml    Telemetry reveals sinus rhythm  GEN- The patient is well appearing, alert and oriented x 3 today.   Head- normocephalic, atraumatic Eyes-  Sclera clear, conjunctiva pink Ears- hearing intact Oropharynx- clear Neck- supple,  Lungs- Clear to ausculation bilaterally, normal work of breathing Heart- Regular rate and rhythm, no murmurs, rubs or gallops, PMI not laterally displaced GI- soft, NT, ND, + BS Extremities- no clubbing, cyanosis, or edema Skin- no rash or lesion  LABS: Basic Metabolic Panel:  Recent Labs  05/03/15 0304  05/04/15 0245 05/04/15 0711  NA 140 141  --   K 3.2* 3.3*  --   CL 107 107  --   CO2 23 26  --   GLUCOSE 148* 154*  --   BUN 19 24*  --   CREATININE 0.87 1.10  --   CALCIUM 8.8* 8.5*  --   MG  --   --  2.1   Liver Function Tests:  Recent Labs  05/03/15 0304 05/04/15 0245  AST 99* 107*  ALT 85* 133*  ALKPHOS 65 65  BILITOT 1.2 0.6  PROT 5.8* 5.6*  ALBUMIN 2.3* 2.3*   No results for input(s): LIPASE, AMYLASE in the last 72 hours. CBC:  Recent Labs  05/03/15 0304 05/04/15 0245  WBC 21.8* 16.8*  HGB 13.6 14.0  HCT 38.5* 39.5  MCV 89.1 88.6  PLT 260 282   Cardiac Enzymes:  Recent Labs  05/01/15 1950 05/02/15 0142 05/02/15 0810  TROPONINI 0.12* 0.13* 0.16*    ASSESSMENT AND PLAN:  Principal Problem:   Sepsis Active Problems:   IPF (idiopathic pulmonary fibrosis)   Hypertension   Acute renal failure (ARF)   Abdominal pain   Acute respiratory failure with hypoxia   HLD (hyperlipidemia)  CAP (community acquired pneumonia)   Elevated troponin   Cholecystitis, acute   LV dysfunction   UTI (urinary tract infection)   Acute systolic congestive heart failure   Demand ischemia  1. Elevated troponin: Likely due to metabolic illness/ lactic acidosis.  Not an acute MI.  He does have a mildly reduced EF with WMA on echo.   - Consider starting aspirin and/or restarting home plavix when clinically stable per primary team. - Lexiscan Myoview in office post discharge and prior to surgery for risk stratification  2. Reduced EF: Appears mildly volume overloaded on exam.  Agree with holding IV lasix today and likely start po lasix tomorrow. - Continue losartan - Would switch metoprolol to coreg - Keep K>4  3. HTN: BP poorly-controlled this AM.  Would switch metoprolol to carvedilol 12.5mg  for heart failure and improved BP control.  4. HL: Would hold statin given worsening transaminitis.   Sharol Harness, MD 05/04/2015 8:31 AM

## 2015-05-04 NOTE — Progress Notes (Signed)
[facility] PROGRESS NOTE  BRENT TAILLON TIW:[id number] DOB: April 08, 1931 DOA: 04/30/2015 PCP: Jerlyn Ly, MD  Brief Summary  Lee [name] is a 81 y.o. male with history of interstitial lung disease, abdominal aortic aneurysm status post repair, hyperlipidemia, hypertension, left foot drop was advised to come to the ER after patient's blood work showed leukocytosis. Patient is ago was at [facility] when patient developed abdominal pain and CT abdomen and pelvis without contrast which can be traced in care everywhere was unremarkable. As per the family patient's lipase is mildly elevated and was advised to follow-up. Patient had gone to his PCP 2 days ago and had blood work which showed leukocytosis and patient was advised to come to the ER. Lab works done in the ER shows WBC count around 26,000 and patient was still complaining of abdominal pain which is mostly in the epigastric area radiating to the back. As per patient's wife patient has significant pain yesterday and patient has largely stayed on the recliner. Denies any nausea vomiting or diarrhea. Since today morning patient has been having increasing shortness of breath with productive cough. Chest x-ray shows infiltrate concerning for pneumonia. Blood cultures has been obtained and patient has been admitted for sepsis and further workup for abdominal pain.   Assessment/Plan  Sepsis secondary to acute cholecystitis and possible CAP and UTI, heart rate trending down -  Continue Zosyn pending results of gallbladder fluid culture -  Urine culture growing citrobacter (sensitivities below) -  Blood cultures no growth to date 2 days -  Culture from percutaneous cholecystostomy drain growing E. Coli, sensitivities pending  Acute cholecystitis, no bile in bag.  Could imply obstructive stone or persistent significant swelling -  Continue antibiotics as above -  Cholecystostomy drain placed on 9/9 by interventional  radiology -  Plan for follow-up with IR drain clinic in 6 weeks -  Appreciate general surgery assistance -  If patient has obstructing stone, he will eventually need a laparoscopic cholecystectomy  Acute hypoxic respiratory failure due to acute COPD exacerbation and acute systolic heart failure. -  Continue xopenex -  Continue spiriva -  Continue steroids -  Continue antibiotics  Acute systolic heart failure ejection fraction of 40-45% and evidence of pulmonary edema on chest x-ray, breathing improved but BUN and creatinine trending up -  Daily weights:  stable -  Strict ins and outs:  -1.48L -  D/c IV lasix -  Beta blocker and ARB per cardiology, appreciate assistance  Elevated troponin.  Probable demand ischemia due to sepsis and tachycardia.   Troponins approximately flat.  Has new wall motion abnormality on echocardiogram and has new interventricular conduction delay versus new left bundle branch block on EKG compared to prior from several years ago. -  Continue aspirin, statin, beta blocker  -  Stress test as outpatient   Sinus tachycardia, likely due to sepsis, resolved with antibiotics -  TSH and fT4 within normal limits   Hypertension, blood pressure improving with carvedilol, losartan  AKI due to sepsis/dehydration, resolved with IVF  Chronic left lower extremity weakness, stable  Leukocytosis likely secondary to sepsis and trending down with antibiotics  Hyponatremia resolved with diuresis  Mild transaminitis may be secondary to recent cholecystostomy drain placement - continuing to rise - avoid hepatotoxins  Diet:  Healthy heart Access:  PIV IVF:  Off Proph: Lovenox   Code Status:  DNR per discussion with the patient and the son who was at bedside who witnessed the conversation on  9/9 Family Communication: patient alone Disposition Plan:  Pending improvement in breathing, PT/OT evaluation.  Anticipate SNF at discharge.     Consultants:  General  surgery  Procedures:  CT abd/pelvis  HIDA pending  ECHO  Antibiotics:  vanco 9/9 - 9/10  Zosyn 9/9    azithro 9/8 - 9/9  HPI/Subjective:  Breathing slightly improved today but still has SOB with movement.  Abdominal pain resolving and starting to have brown material in cholecystostomy drain.  Denies nausea, vomiting.    Objective: Filed Vitals:   05/04/15 0500 05/04/15 0802 05/04/15 1136 05/04/15 1219  BP:  162/65  119/50  Pulse:  92 88 84  Temp:  97.7 F (36.5 C)  97.6 F (36.4 C)  TempSrc:  Oral  Oral  Resp:    25  Height:      Weight: 87.68 kg (193 lb 4.8 oz)     SpO2:  96% 96% 93%    Intake/Output Summary (Last 24 hours) at 05/04/15 1409 Last data filed at 05/04/15 1219  Gross per 24 hour  Intake    750 ml  Output   1910 ml  Net  -1160 ml   Filed Weights   05/02/15 0500 05/03/15 0500 05/04/15 0500  Weight: 90.855 kg (200 lb 4.8 oz) 90.266 kg (199 lb) 87.68 kg (193 lb 4.8 oz)   Body mass index is 31.21 kg/(m^2).  Exam:   General:  Obese male, NAD  HEENT:  NCAT, MMM  Cardiovascular:  RRR, nl S1, S2 no mrg, 2+ pulses, warm extremities  Respiratory: less wheeze, rhonchorous cough, diminished at bases  Abdomen:   NABS, soft, minimally TTP in the RUQ, drain in place with brown/bilious fluid  MSK:   Normal tone and bulk, no LEE except for left foot  Neuro:  Grossly intact except HOH and has left foot drop  Data Reviewed: Basic Metabolic Panel:  Recent Labs Lab 04/30/15 1928 05/01/15 0020 05/01/15 0615 05/02/15 0142 05/03/15 0304 05/04/15 0245 05/04/15 0711  NA 127*  --  134* 132* 140 141  --   K 3.6  --  3.3* 3.6 3.2* 3.3*  --   CL 96*  --  105 104 107 107  --   CO2 19*  --  21* 19* 23 26  --   GLUCOSE 140*  --  124* 161* 148* 154*  --   BUN 16  --  10 12 19  24*  --   CREATININE 1.28* 1.14 0.87 0.85 0.87 1.10  --   CALCIUM 8.8*  --  8.2* 8.3* 8.8* 8.5*  --   MG  --   --   --   --   --   --  2.1   Liver Function Tests:  Recent  Labs Lab 04/30/15 1928 05/01/15 0615 05/02/15 0142 05/03/15 0304 05/04/15 0245  AST 46* 39 46* 99* 107*  ALT 42 34 36 85* 133*  ALKPHOS 68 57 61 65 65  BILITOT 1.4* 1.5* 1.2 1.2 0.6  PROT 7.2 5.9* 6.0* 5.8* 5.6*  ALBUMIN 3.2* 2.7* 2.5* 2.3* 2.3*    Recent Labs Lab 05/01/15 0020  LIPASE 24   No results for input(s): AMMONIA in the last 168 hours. CBC:  Recent Labs Lab 04/30/15 1928 05/01/15 0020 05/01/15 0615 05/02/15 0142 05/03/15 0304 05/04/15 0245  WBC 26.4* 22.7* 23.2* 22.8* 21.8* 16.8*  NEUTROABS 22.2*  --  20.1*  --   --   --   HGB 14.5 13.8 13.4 13.9 13.6 14.0  HCT  41.1 40.2 38.7* 40.0 38.5* 39.5  MCV 88.6 90.1 89.4 89.5 89.1 88.6  PLT 242 223 217 228 260 282    Recent Results (from the past 240 hour(s))  MRSA PCR Screening     Status: Abnormal   Collection Time: 04/30/15 12:00 AM  Result Value Ref Range Status   MRSA by PCR POSITIVE (A) NEGATIVE Final    Comment:        The GeneXpert MRSA Assay (FDA approved for NASAL specimens only), is one component of a comprehensive MRSA colonization surveillance program. It is not intended to diagnose MRSA infection nor to guide or monitor treatment for MRSA infections. RESULT CALLED TO, READ BACK BY AND VERIFIED WITH: A [email or web address]@0218  05/01/15 MKELLY   Culture, blood (routine x 2)     Status: None (Preliminary result)   Collection Time: 04/30/15  8:36 PM  Result Value Ref Range Status   Specimen Description BLOOD RIGHT FOREARM  Final   Special Requests BOTTLES DRAWN AEROBIC AND ANAEROBIC 5CC  Final   Culture NO GROWTH 4 DAYS  Final   Report Status PENDING  Incomplete  Culture, blood (routine x 2)     Status: None (Preliminary result)   Collection Time: 04/30/15  8:42 PM  Result Value Ref Range Status   Specimen Description BLOOD LEFT ARM  Final   Special Requests BOTTLES DRAWN AEROBIC AND ANAEROBIC 5CC  Final   Culture NO GROWTH 4 DAYS  Final   Report Status PENDING  Incomplete  Urine culture      Status: None   Collection Time: 04/30/15  9:23 PM  Result Value Ref Range Status   Specimen Description URINE, CLEAN CATCH  Final   Special Requests NONE  Final   Culture >=100,000 COLONIES/mL CITROBACTER KOSERI  Final   Report Status 05/03/2015 FINAL  Final   Organism ID, Bacteria CITROBACTER KOSERI  Final      Susceptibility   Citrobacter koseri - MIC*    CEFAZOLIN <=4 SENSITIVE Sensitive     CEFTRIAXONE <=1 SENSITIVE Sensitive     CIPROFLOXACIN <=0.25 SENSITIVE Sensitive     GENTAMICIN <=1 SENSITIVE Sensitive     IMIPENEM <=0.25 SENSITIVE Sensitive     NITROFURANTOIN 32 SENSITIVE Sensitive     TRIMETH/SULFA <=20 SENSITIVE Sensitive     PIP/TAZO <=4 SENSITIVE Sensitive     * >=100,000 COLONIES/mL CITROBACTER KOSERI  Culture, routine-abscess     Status: None (Preliminary result)   Collection Time: 05/01/15  6:55 PM  Result Value Ref Range Status   Specimen Description ABSCESS  Final   Special Requests GALLBLADDER  Final   Gram Stain   Final    ABUNDANT WBC PRESENT,BOTH PMN AND MONONUCLEAR NO SQUAMOUS EPITHELIAL CELLS SEEN FEW GRAM NEGATIVE RODS Performed at [facility]    Culture   Final    ABUNDANT ESCHERICHIA COLI Performed at [facility]    Report Status PENDING  Incomplete     Studies: Dg Chest Port 1 View  05/04/2015   CLINICAL DATA:  Wheezing and acute respiratory failure  EXAM: PORTABLE CHEST - 1 VIEW  COMPARISON:  May 02, 2015 chest radiograph; chest CT September 17, 2012  FINDINGS: Changes of underlying COPD remain, documented on prior CT. Fibrotic type change in the lower lung zones is stable. There is mild atelectasis in the right base, less than on study from 2 days prior. No frank edema or consolidation. Heart size is within normal limits. The pulmonary vascular is within normal limits. No adenopathy.  Drain in the right lateral abdomen is stable.  IMPRESSION: Less atelectasis on the right compared to 2 days prior. Underlying interstitial  fibrotic type change and COPD as documented on prior CT examination. No edema or consolidation. No change in cardiac silhouette.   Electronically Signed   By: Lowella Grip III M.D.   On: 05/04/2015 09:34    Scheduled Meds: . amLODipine  5 mg Oral Daily  . antiseptic oral rinse  7 mL Mouth Rinse BID  . Chlorhexidine Gluconate Cloth  6 each Topical Q0600  . enoxaparin (LOVENOX) injection  40 mg Subcutaneous Q24H  . ezetimibe  5 mg Oral Daily  . Influenza vac split quadrivalent PF  0.5 mL Intramuscular Tomorrow-1000  . levalbuterol  1.25 mg Nebulization Q6H  . losartan  50 mg Oral Daily  . methylPREDNISolone (SOLU-MEDROL) injection  60 mg Intravenous BID  . metoprolol tartrate  12.5 mg Oral BID  . mupirocin ointment  1 application Nasal BID  . niacin  500 mg Oral QHS  . pantoprazole  40 mg Oral Daily  . piperacillin-tazobactam (ZOSYN)  IV  3.375 g Intravenous Q8H  . pravastatin  40 mg Oral q1800  . tiotropium  18 mcg Inhalation Daily   Continuous Infusions:    Principal Problem:   Sepsis Active Problems:   IPF (idiopathic pulmonary fibrosis)   Hypertension   Acute renal failure (ARF)   Abdominal pain   Acute respiratory failure with hypoxia   HLD (hyperlipidemia)   CAP (community acquired pneumonia)   Elevated troponin   Cholecystitis, acute   LV dysfunction   UTI (urinary tract infection)   Acute systolic congestive heart failure   Demand ischemia    Time spent: 30 min    Kaycie Pegues, [facility] Pager [phone]. If 7PM-7AM, please contact night-coverage at [email or web address], password [facility] 05/04/2015, 2:09 PM  LOS: 4 days

## 2015-05-04 NOTE — Progress Notes (Signed)
CSW (Clinical Education officer, museum) received SNF consult. Per PT note, recommendation for home and pt also requesting home. At this time, no social work needs. CSW signing off.  [place], [place]

## 2015-05-04 NOTE — Progress Notes (Signed)
Physical Therapy Treatment Patient Details Name: Lee [name] MRN: [id number] DOB: Jan 24, 1931 Today's Date: 05/04/2015    History of Present Illness Pt adm with cholecystitis and had percutaneous drain placed on 9/9. Pt also with resp failure and acute kidney failure. PMH - pulmonary fibrosis, AAA, HTN, PVD, lt foot drop from old injury    PT Comments    Pt with excellent progression from evaluation able to walk entire unit with only 2L [place] with sats 90-93%. Pt required cues for safety and sequence with transfers as well as use of RW. Pt continues to desire return home and with family assist this is recommended. Will continue to follow and attempt stairs next session. Pt encouraged to continue HEp and mobility with nursing with use of RW.   Follow Up Recommendations  Home health PT;Supervision for mobility/OOB     Equipment Recommendations  Rolling walker with 5" wheels    Recommendations for Other Services       Precautions / Restrictions Precautions Precautions: Fall Precaution Comments: L foot drop    Mobility  Bed Mobility               General bed mobility comments: in chair on arrival  Transfers Overall transfer level: Needs assistance   Transfers: Sit to/from Stand Sit to Stand: Min guard         General transfer comment: cues for hand placement and safety x 2 trials as pt initially plopping into chair and required repeated trial to control descent and safely land in chair  Ambulation/Gait Ambulation/Gait assistance: Min guard Ambulation Distance (Feet): 450 Feet Assistive device: Rolling walker (2 wheeled) Gait Pattern/deviations: Shuffle;Trunk flexed;Steppage   Gait velocity interpretation: Below normal speed for age/gender General Gait Details: cues to step into RW and extend trunk   Stairs            Wheelchair Mobility    Modified Rankin (Stroke Patients Only)       Balance Overall balance assessment: Needs assistance   Sitting  balance-Leahy Scale: Good       Standing balance-Leahy Scale: Fair                      Cognition Arousal/Alertness: Awake/alert Behavior During Therapy: WFL for tasks assessed/performed Overall Cognitive Status: Impaired/Different from baseline Area of Impairment: Memory;Orientation;Safety/judgement Orientation Level: Time   Memory: Decreased short-term memory   Safety/Judgement: Decreased awareness of deficits;Decreased awareness of safety          Exercises General Exercises - Upper Extremity Shoulder Flexion: AROM;Seated;Both;20 reps General Exercises - Lower Extremity Hip Flexion/Marching: AROM;Seated;Both;20 reps    General Comments        Pertinent Vitals/Pain Pain Assessment: No/denies pain  HR 85-93    Home Living                      Prior Function            PT Goals (current goals can now be found in the care plan section) Progress towards PT goals: Progressing toward goals    Frequency       PT Plan Current plan remains appropriate    Co-evaluation             End of Session Equipment Utilized During Treatment: Oxygen;Gait belt Activity Tolerance: Patient tolerated treatment well Patient left: in chair;with call bell/phone within reach;with chair alarm set;with family/visitor present     Time: 0623-7628 PT Time Calculation (min) (ACUTE ONLY): 26  min  Charges:  $Gait Training: 8-22 mins $Therapeutic Exercise: 8-22 mins                    G Codes:      Melford Aase May 24, 2015, 11:40 AM Elwyn Reach, [place]

## 2015-05-05 DIAGNOSIS — J9601 Acute respiratory failure with hypoxia: Secondary | ICD-10-CM

## 2015-05-05 LAB — COMPREHENSIVE METABOLIC PANEL
ALK PHOS: 52 U/L (ref 38–126)
ALT: 87 U/L — ABNORMAL HIGH (ref 17–63)
ANION GAP: 7 (ref 5–15)
AST: 36 U/L (ref 15–41)
Albumin: 2.3 g/dL — ABNORMAL LOW (ref 3.5–5.0)
BILIRUBIN TOTAL: 0.8 mg/dL (ref 0.3–1.2)
BUN: 26 mg/dL — ABNORMAL HIGH (ref 6–20)
CALCIUM: 8.4 mg/dL — AB (ref 8.9–10.3)
CO2: 26 mmol/L (ref 22–32)
Chloride: 108 mmol/L (ref 101–111)
Creatinine, Ser: 1.03 mg/dL (ref 0.61–1.24)
GFR calc non Af Amer: >60 mL/min (ref >60)
GLUCOSE: 134 mg/dL — AB (ref 65–99)
Potassium: 3.5 mmol/L (ref 3.5–5.1)
Sodium: 141 mmol/L (ref 135–145)
TOTAL PROTEIN: 5.2 g/dL — AB (ref 6.5–8.1)

## 2015-05-05 LAB — CULTURE, BLOOD (ROUTINE X 2)
Culture: NO GROWTH
Culture: NO GROWTH

## 2015-05-05 LAB — CBC
HCT: 39.9 % (ref 39.0–52.0)
HEMOGLOBIN: 13.5 g/dL (ref 13.0–17.0)
MCH: 30.6 pg (ref 26.0–34.0)
MCHC: 33.8 g/dL (ref 30.0–36.0)
MCV: 90.5 fL (ref 78.0–100.0)
PLATELETS: 276 10*3/uL (ref 150–400)
RBC: 4.41 MIL/uL (ref 4.22–5.81)
RDW: 13.7 % (ref 11.5–15.5)
WBC: 16 10*3/uL — ABNORMAL HIGH (ref 4.0–10.5)

## 2015-05-05 LAB — CULTURE, ROUTINE-ABSCESS

## 2015-05-05 MED ORDER — CARVEDILOL 12.5 MG PO TABS: 12.5000 mg | ORAL_TABLET | Freq: Two times a day (BID) | ORAL | Status: DC

## 2015-05-05 MED ORDER — SULFAMETHOXAZOLE-TRIMETHOPRIM 800-160 MG PO TABS
1.0000 | ORAL_TABLET | Freq: Two times a day (BID) | ORAL | Status: DC
Start: 2015-05-05 — End: 2015-05-13

## 2015-05-05 MED ORDER — PREDNISONE 20 MG PO TABS
40.0000 mg | ORAL_TABLET | Freq: Every day | ORAL | Status: DC
Start: 2015-05-05 — End: 2015-05-13

## 2015-05-05 MED ORDER — SODIUM CHLORIDE 0.9 % IJ SOLN: 10.0000 mL | INTRAMUSCULAR | Status: DC | PRN

## 2015-05-05 MED ORDER — TIOTROPIUM BROMIDE MONOHYDRATE 18 MCG IN CAPS
18.0000 ug | ORAL_CAPSULE | Freq: Every day | RESPIRATORY_TRACT | Status: DC
Start: 2015-05-05 — End: 2017-07-24

## 2015-05-05 MED ORDER — ASPIRIN EC 81 MG PO TBEC
81.0000 mg | DELAYED_RELEASE_TABLET | Freq: Every day | ORAL | Status: DC
  Administered 2015-05-05: 81 mg via ORAL
  Filled 2015-05-05: qty 1

## 2015-05-05 MED ORDER — NITROGLYCERIN 0.4 MG SL SUBL: 0.4000 mg | SUBLINGUAL_TABLET | SUBLINGUAL | Status: DC | PRN

## 2015-05-05 MED ORDER — CARVEDILOL 12.5 MG PO TABS
12.5000 mg | ORAL_TABLET | Freq: Two times a day (BID) | ORAL | Status: DC
  Administered 2015-05-05: 12.5 mg via ORAL
  Filled 2015-05-05: qty 1

## 2015-05-05 MED ORDER — ALBUTEROL SULFATE HFA 108 (90 BASE) MCG/ACT IN AERS: 2.0000 | INHALATION_SPRAY | Freq: Four times a day (QID) | RESPIRATORY_TRACT | Status: DC | PRN

## 2015-05-05 NOTE — Progress Notes (Signed)
Physical Therapy Treatment Patient Details Name: Lee [name] MRN: [id number] DOB: [birth year] Today's Date: 05/05/2015    History of Present Illness Pt adm with cholecystitis and had percutaneous drain placed on 9/9. Pt also with resp failure and acute kidney failure. PMH - pulmonary fibrosis, AAA, HTN, PVD, lt foot drop from old injury    PT Comments    Pt moving well and able to tolerate stairs, gait and transfers with increased control. Pt performed HEP with encouragement to continue. Pt states understanding of stair negotiation and states wife can assist at home. Will continue to follow.   Follow Up Recommendations  Home health PT;Supervision for mobility/OOB     Equipment Recommendations  Rolling walker with 5" wheels    Recommendations for Other Services       Precautions / Restrictions Precautions Precautions: Fall Precaution Comments: L foot drop, chole drain    Mobility  Bed Mobility Overal bed mobility: Modified Independent             General bed mobility comments: increased time with rail   Transfers Overall transfer level: Needs assistance   Transfers: Sit to/from Stand Sit to Stand: Min guard         General transfer comment: cues for hand placement, safety and control  Ambulation/Gait Ambulation/Gait assistance: Min guard Ambulation Distance (Feet): 475 Feet Assistive device: Rolling walker (2 wheeled) Gait Pattern/deviations: Step-through pattern;Decreased stride length;Trunk flexed   Gait velocity interpretation: Below normal speed for age/gender General Gait Details: cues to step into RW and extend trunk   Stairs Stairs: Yes Stairs assistance: Min assist Stair Management: Step to pattern;Forwards Number of Stairs: 2 General stair comments: bil HHA and recommend use of family shoulders for home as he had difficulty stabiliing with only HHA. Also recommend rail for home  Wheelchair Mobility    Modified Rankin (Stroke Patients  Only)       Balance Overall balance assessment: Needs assistance   Sitting balance-Leahy Scale: Good       Standing balance-Leahy Scale: Poor                      Cognition Arousal/Alertness: Awake/alert Behavior During Therapy: WFL for tasks assessed/performed Overall Cognitive Status: Within Functional Limits for tasks assessed                      Exercises General Exercises - Lower Extremity Long Arc Quad: AROM;Seated;Both;20 reps Hip Flexion/Marching: AROM;Seated;Both;20 reps    General Comments        Pertinent Vitals/Pain Pain Assessment: No/denies pain  HR 96-106 with gait 93% on 3L with gait, 95% on 2L at rest    Home Living                      Prior Function            PT Goals (current goals can now be found in the care plan section) Progress towards PT goals: Progressing toward goals    Frequency       PT Plan Current plan remains appropriate    Co-evaluation             End of Session Equipment Utilized During Treatment: Oxygen;Other (comment) (left aFO) Activity Tolerance: Patient tolerated treatment well Patient left: in chair;with call bell/phone within reach;with chair alarm set;with nursing/sitter in room     Time: 9024-0973 PT Time Calculation (min) (ACUTE ONLY): 27 min  Charges:  $Gait Training: 8-22  mins $Therapeutic Exercise: 8-22 mins                    G Codes:      Melford Aase 2015/05/29, 8:22 AM Elwyn Reach, [place]

## 2015-05-05 NOTE — Discharge Summary (Signed)
Physician Discharge Summary  Lee [name] ASU:[id number] DOB: 06-17-1931 DOA: 04/30/2015  PCP: Jerlyn Ly, MD  Admit date: 04/30/2015 Discharge date: 05/05/2015  Recommendations for Outpatient Follow-up:  1. F/u with interventional radiology drain clinic in approximately 5-6 weeks for anticipated fluoroscopy guided drain injection to establish maturity of the tract and patency of the cystic duct.  2. Follow-up with Dr. Marlou Starks in 4 weeks 3. Follow-up with Dr. Allred/[facility] in 1-2 weeks for nuclear medicine stress test 4. Continue antibiotics for a total of 7 days, through 9/15, then stop  Discharge Diagnoses:  Principal Problem:   Sepsis Active Problems:   IPF (idiopathic pulmonary fibrosis)   Hypertension   Acute renal failure (ARF)   Abdominal pain   Acute respiratory failure with hypoxia   HLD (hyperlipidemia)   CAP (community acquired pneumonia)   Elevated troponin   Cholecystitis, acute   LV dysfunction   UTI (urinary tract infection)   Acute systolic congestive heart failure   Demand ischemia   Discharge Condition: Stable, improved  Diet recommendation: Low-sodium  Wt Readings from Last 3 Encounters:  05/05/15 87.8 kg (193 lb 9 oz)  02/26/15 88.089 kg (194 lb 3.2 oz)  10/22/14 90.175 kg (198 lb 12.8 oz)    History of present illness:  Lee [name] is a 81 y.o. male with history of interstitial lung disease, abdominal aortic aneurysm status post repair, hyperlipidemia, hypertension, left foot drop was advised to come to the ER after patient's blood work showed leukocytosis. Patient was seen a week ago at the hospital in [place], [place] due to abdominal pain.  CT abdomen and pelvis without contrast was unremarkable. Per the family, patient's lipase is mildly elevated and he was given pain medication and antiemetics and advised to follow-up. He saw his PCP 2 days prior to admission and had blood work which showed leukocytosis.  He was advised to come to the  ER.  In the ER, WBC count was 26,000 and patient was still complaining of severe abdominal pain which was mostly in the epigastric area radiating to the back. He denied any nausea vomiting or diarrhea, but had had severe weakness. He also had increasing shortness of breath with productive cough. Chest x-ray demonstrated right lower lobe infiltrate concerning for pneumonia. Blood cultures were obtained and patient was admitted for sepsis and further workup of abdominal pain.  Hospital Course:   Sepsis secondary to acute Escherichia coli cholecystitis and right lower lobe CAP and Citrobacter UTI.  He was started on broad-spectrum antibiotics, and he was seen by general surgery due to concerns for cholecystitis.  His CT scan demonstrated acute inflammatory changes surrounding the gallbladder which were concerning for acute cholecystitis.  HIDA scan confirmed the diagnosis. Because he was Lee [name] of breath, and had multiple comorbidities, general surgery recommended cholecystostomy drain placement. Interventional radiology was consulted and he had a cholecystostomy drain and placed on the same day. Urine culture grew Citrobacter, and fluid from his cholecystostomy drain grew Escherichia coli. As he clinically improved, his vancomycin and azithromycin were discontinued and he was continued on Zosyn until the sensitivities were reported on both his urine and bile. His blood cultures have remained no growth to date. He should continue antibiotics to complete a 7 day course, last day on 9/15.  Bactrim chosen based on patient allergies, age, and abx sensitivities.  Bactrim does not cover S. pneumo well, however, he received 5, almost 6 days of zosyn in the hospital and I think his primary problem  was acute cholecystitis.  Adding an additional antibiotic would increase his risk of C. Diff colitis.  He was advised to return to the hospital if he has worsening SOB.    Acute cholecystitis, status post cholecystostomy drain  on 9/9 by interventional radiology. He will need follow-up with interventional radiology in approximately 5-6 weeks for fluoroscopy guided drain injection to evaluate for gallstones or obstruction.  He will also follow-up with general surgery in approximately 1 month. If there is evidence of gallstone, he will need to have a laparoscopic cholecystectomy once he has recovered from this illness.    Acute hypoxic respiratory failure due to acute COPD exacerbation and acute systolic heart failure.  He was started on Solu-Medrol, antibiotics, breathing treatments. Due to tachycardia, he was not given brovana and his albuterol was changed to xopenex.  He has completed a course of steroids for his breathing, which has improved.  He will need to use oxygen continuously at home at 2 L at rest and 4 L with exertion.  Acute systolic heart failure ejection fraction of 40-45% and evidence of pulmonary edema on chest x-ray.  He had worsening shortness of breath with fluid resuscitation for sepsis. An chest x-ray demonstrated pulmonary edema. His echocardiogram demonstrated new onset systolic heart failure with ejection fraction of 40-45% with a new wall motion abnormality. He was diuresed with Lasix 40 mg IV twice a day and he was seen by cardiology who agreed with beta blocker and ARB.   Elevated troponin. Probable demand ischemia due to sepsis and tachycardia. Troponins approximately flat. Has new wall motion abnormality on echocardiogram and has new interventricular conduction delay versus new left bundle branch block on EKG compared to prior from several years ago.  Continue plavix, statin, beta blocker and will have stress test once clinically improved.   He was given a prescription for nitroglycerin to use as needed should he develop chest pain between now and the time that he has further evaluation.  Sinus tachycardia, likely due to sepsis, trended down with antibiotics.  TSH and fT4 within normal limits.     Hypertension, blood pressure improved with carvedilol, losartan  AKI due to sepsis/dehydration, resolved with IVF.  Chronic left lower extremity weakness, stable  Leukocytosis likely secondary to sepsis and trended down with antibiotics.  Hyponatremia resolved with diuresis  Mild transaminitis was likely secondary to acute cholecystitis and cholecystostomy drain placement and resolve spontaneously.  Generalized weakness due to severe illness.  PT/OT recommending [place] services.  Rolling walker ordered.    Consultants:  General surgery  Cardiology  Interventional radiology  Procedures:  CT abd/pelvis  HIDA   ECHO  Cholecystostomy drain placement on 9/9  Antibiotics:  vanco 9/9 - 9/10  Zosyn 9/9   azithro 9/8 - 9/9  Discharge Exam: Filed Vitals:   05/05/15 0818  BP:   Pulse: 101  Temp:   Resp:    Filed Vitals:   05/05/15 0400 05/05/15 0742 05/05/15 0800 05/05/15 0818  BP: 150/68 128/64    Pulse: 81 82 107 101  Temp: 97.3 F (36.3 C) 97.8 F (36.6 C)    TempSrc: Oral Oral    Resp: 20 24 24    Height:      Weight: 87.8 kg (193 lb 9 oz)     SpO2: 93% 95% 90% 94%     General: Obese male, NAD  HEENT: NCAT, MMM  Cardiovascular: RRR, nl S1, S2 no mrg, 2+ pulses, warm extremities  Respiratory:  Coarse rales at the right  base, otherwise clear to auscultation bilaterally, diminished at bases  Abdomen: NABS, soft, nontender, nondistended, drain in place with brown/bilious fluid without surrounding erythema or purulent discharge  MSK: Normal tone and bulk, no LEE except for left foot  Neuro: Grossly intact except HOH and has left foot drop  Discharge Instructions      Discharge Instructions    (HEART FAILURE PATIENTS) Call MD:  Anytime you have any of the following symptoms: 1) 3 pound weight gain in 24 hours or 5 pounds in 1 week 2) shortness of breath, with or without a dry hacking cough 3) swelling in the hands, feet or stomach 4) if  you have to sleep on extra pillows at night in order to breathe.    Complete by:  As directed      Call MD for:  difficulty breathing, headache or visual disturbances    Complete by:  As directed      Call MD for:  extreme fatigue    Complete by:  As directed      Call MD for:  hives    Complete by:  As directed      Call MD for:  persistant dizziness or light-headedness    Complete by:  As directed      Call MD for:  persistant nausea and vomiting    Complete by:  As directed      Call MD for:  redness, tenderness, or signs of infection (pain, swelling, redness, odor or green/yellow discharge around incision site)    Complete by:  As directed      Call MD for:  severe uncontrolled pain    Complete by:  As directed      Call MD for:  temperature >100.4    Complete by:  As directed      Diet - low sodium heart healthy    Complete by:  As directed      Discharge instructions    Complete by:  As directed   You were hospitalized with pneumonia, urinary tract infection, and gallbladder infection.  You should continue antibiotics for the next 2 days to complete a 7 day course of antibiotics.  Your next dose of bactrim is due this evening.  If you have questions about your cholecystostomy drain, please call your home health RN to troubleshoot or for advice.  Please take or spiriva again for now and use albuterol as needed for shortness of breath.  I have given you a prescription for prednisone to take for the next few days, your next dose is due tomorrow.  This will help decrease inflammation of the lungs to help you breath better.  Please use oxygen 2 L at night and while resting and 4L with exertion.  If you have worsening fevers, chills, nausea, vomiting, diarrhea, difficulty breathing, chest pains, or any other concerning symptoms, please return to the hospital right away.     Increase activity slowly    Complete by:  As directed             Medication List    STOP taking these  medications        amLODipine 5 MG tablet  Commonly known as:  NORVASC     dicyclomine 20 MG tablet  Commonly known as:  BENTYL     niacin 500 MG CR tablet  Commonly known as:  NIASPAN      TAKE these medications        albuterol 108 (90 BASE) MCG/ACT  inhaler  Commonly known as:  PROVENTIL HFA;VENTOLIN HFA  Inhale 2 puffs into the lungs every 6 (six) hours as needed for wheezing or shortness of breath.     carvedilol 12.5 MG tablet  Commonly known as:  COREG  Take 1 tablet (12.5 mg total) by mouth 2 (two) times daily with a meal.     clopidogrel 75 MG tablet  Commonly known as:  PLAVIX  Take 75 mg by mouth daily.     ezetimibe 10 MG tablet  Commonly known as:  ZETIA  Take 5 mg by mouth daily.     LIVALO 2 MG Tabs  Generic drug:  Pitavastatin Calcium  Take 2 mg by mouth See admin instructions. Every 3rd day     losartan 50 MG tablet  Commonly known as:  COZAAR  Take 50 mg by mouth daily.     nitroGLYCERIN 0.4 MG SL tablet  Commonly known as:  NITROSTAT  Place 1 tablet (0.4 mg total) under the tongue every 5 (five) minutes as needed for chest pain.     pantoprazole 40 MG tablet  Commonly known as:  PROTONIX  Take 40 mg by mouth daily.     predniSONE 20 MG tablet  Commonly known as:  DELTASONE  Take 2 tablets (40 mg total) by mouth daily with breakfast.     sodium chloride 0.9 % injection  Inject 10 mLs into the vein as needed.     sulfamethoxazole-trimethoprim 800-160 MG per tablet  Commonly known as:  BACTRIM DS,SEPTRA DS  Take 1 tablet by mouth 2 (two) times daily.     tiotropium 18 MCG inhalation capsule  Commonly known as:  SPIRIVA  Place 1 capsule (18 mcg total) into inhaler and inhale daily.       Follow-up Information    Follow up with Merrie Roof, MD. Schedule an appointment as soon as possible for a visit in 4 weeks.   Specialty:  General Surgery   Why:  to follow up gallbladder drain.    Contact information:   [address] [place] [address] [phone]       Follow up with PERINI,MARK A, MD. Schedule an appointment as soon as possible for a visit in 2 weeks.   Specialty:  Internal Medicine   Contact information:   [address] [place] [address] [phone]       Follow up with [place]. Schedule an appointment as soon as possible for a visit in 2 weeks.   Contact information:   [place] [address] [phone]      Follow up with Interventional Radiology [facility]. Schedule an appointment as soon as possible for a visit in 5 weeks.   Contact information:   [facility], first floor, radiology department  Phone: [phone]      Follow up with Melina Copa, PA-C. Go on 05/13/2015.   Specialties:  Cardiology, Radiology   Why:  @11 :30 for cardiology follow up   Contact information:   [address] [place] [address] [phone]        The results of significant diagnostics from this hospitalization (including imaging, microbiology, ancillary and laboratory) are listed below for reference.    Significant Diagnostic Studies: Ct Abdomen Pelvis Wo Contrast  05/01/2015   CLINICAL DATA:  81 year old male with right-sided abdominal pain. Concern for sepsis  EXAM: CT ABDOMEN AND PELVIS WITHOUT CONTRAST  TECHNIQUE: Multidetector CT imaging of the abdomen and pelvis was performed following the standard protocol without IV contrast.  COMPARISON:  CT dated 10/11/2013  FINDINGS: Evaluation of this exam is limited in the absence of intravenous contrast. Evaluation is also limited due to respiratory motion artifact.  There are emphysematous changes of the lungs. Patchy subsegmental right lung base densities may represent atelectatic changes. Pneumonia is less likely but not excluded. Clinical correlation is recommended. There is coronary vascular calcification. No  intra-abdominal free air. Trace perihepatic free fluid.  A 4.5 cm grossly stable appearing hypodense lesion in the left lobe of the liver is not characterized on this study but characterized as a cyst on the prior study. There is stranding of the fat surrounding the gallbladder. There are areas of apparent thickening of the gallbladder wall. No calcified gallstone identified. Findings are concerning for an acute cholecystitis. Ultrasound is recommended for better evaluation of the gallbladder. The pancreas, spleen, and adrenal glands appear unremarkable. There are multiple right renal hypodense lesion with the largest measuring approximately 8 cm in the inferior pole of the right kidney. These lesions demonstrate fluid attenuation and likely represent cysts. Characterization is however limited on this noncontrast study. There is no hydronephrosis or nephrolithiasis on either side. The visualized ureters and urinary bladder appear unremarkable. The prostate gland is grossly unremarkable. Coarse calcification of the prostate noted.  There is extensive diverticulosis of the sigmoid colon as well as scattered colonic diverticula without active inflammation. There is no evidence of bowel obstruction. Normal appendix.  There is aortoiliac atherosclerotic disease. 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 There is no lymphadenopathy. There is a 8 cm defect in the midline upper peritoneum with partial herniation of a segment of transverse colon. No evidence of obstruction or inflammation. This finding is new compared to the prior study. Midline vertical anterior abdominal wall incisional scar is noted. Anterior pelvic surgical scar is also noted. No fluid collection identified. There is degenerative changes of the spine. There is old healed fracture with chronic deformity of the left pubic bone and stable diastases of the symphysis pubis. No acute fracture identified. Stable  L5 sclerotic focus likely represents an enostosis.  IMPRESSION: Apparent inflammatory changes of the fat surrounding the gallbladder concerning for acute cholecystitis. Ultrasound is recommended for better evaluation of the gallbladder.  Broad-based ventral hernia containing a Bless[place] Ozga segment of the transverse colon without evidence of obstruction or inflammation.  Diverticulosis. No evidence of bowel obstruction or inflammation the   Electronically Signed   By: Anner Crete M.D.   On: 05/01/2015 01:55   Dg Chest 2 View  04/30/2015   CLINICAL DATA:  Acute onset of fever up to 100.4 degrees F which began yesterday. Patient was found to have leukocytosis with [place] [address] and was hypoxic at his primary provider's office yesterday.  EXAM: CHEST  2 VIEW  COMPARISON:  CT chest 09/17/2012.  Two-view chest x-ray 08/06/2012.  FINDINGS: AP semi-erect and lateral images were obtained. Cardiac silhouette normal in size, unchanged. Thoracic aorta atherosclerotic and mildly tortuous, unchanged. Hilar and mediastinal contours otherwise unremarkable. Suboptimal inspiration with linear atelectasis in the left lower lobe, lingula and right middle lobe. Emphysematous changes in the upper lobes and mild to moderate central peribronchial thickening, unchanged. Dense consolidation in the lateral right lower lobe. No pleural effusions. Degenerative changes involving the thoracic and visualized upper lumbar spine. Old healed fracture involving the  right clavicle.  IMPRESSION: 1. Dense consolidation in the lateral right lower lobe, likely a combination of acute atelectasis and pneumonia. 2. Suboptimal inspiration with linear atelectasis in the left lower lobe, lingula and right middle lobe.   Electronically Signed   By: Evangeline Dakin M.D.   On: 04/30/2015 20:09   Nm Hepatobiliary Liver Func  05/01/2015   CLINICAL DATA:  Abdominal pain.  Abnormal CT.  EXAM: NUCLEAR MEDICINE HEPATOBILIARY IMAGING  TECHNIQUE:  Sequential images of the abdomen were obtained out to 60 minutes following intravenous administration of radiopharmaceutical. 3.6 mg of morphine administered IV  RADIOPHARMACEUTICALS:  5.2 MCi Tc-34m  Choletec IV  COMPARISON:  None.  FINDINGS: Liver, biliary system, bowel visualize normally. The gallbladder is not definitely identified. Morphine administered again without gallbladder visualization. These findings suggest cholecystitis. Gallbladder ultrasound should be considered for further evaluation.  IMPRESSION: Findings suggesting cholecystitis.   Electronically Signed   By: Marcello Moores  Register   On: 05/01/2015 13:26   Ir Perc Cholecystostomy  05/01/2015   INDICATION: 81 year old male with a history of acute cholecystitis  EXAM: ULTRASOUND AND FLUOROSCOPIC-GUIDED CHOLECYSTOSTOMY TUBE PLACEMENT  COMPARISON:  CT 05/01/2015  MEDICATIONS: Fentanyl 75 mcg IV; Versed 2.0 mg IV; 500 mg Levaquin  ANESTHESIA/SEDATION: Total Moderate Sedation Time  Twenty-five minutes  CONTRAST:  24mL OMNIPAQUE IOHEXOL 300 MG/ML  SOLN  FLUOROSCOPY TIME:  54 seconds, 0 minutes  COMPLICATIONS: None  PROCEDURE: Informed written consent was obtained from the patient and the patient's family after a discussion of the risks, benefits and alternatives to treatment. Questions regarding the procedure were encouraged and answered. A timeout was performed prior to the initiation of the procedure.  The right upper abdominal quadrant was prepped and draped in the usual sterile fashion, and a sterile drape was applied covering the operative field. Maximum barrier sterile technique with sterile gowns and gloves were used for the procedure. A timeout was performed prior to the initiation of the procedure. Local anesthesia was provided with 1% lidocaine with epinephrine.  Ultrasound scanning of the right upper quadrant demonstrates a markedly dilated gallbladder. A transhepatic approach was attempted, however, given the superiorly positioned gallbladder,  there is a concern for transgressing the diaphragm, and a rib space inferiorly was selected for puncture. This lower puncture may or may not represent a transhepatic approach. A 22 gauge needle was advanced into the gallbladder under direct ultrasound guidance. An ultrasound image was saved for documentation purposes. Appropriate intraluminal puncture was confirmed with the efflux of bile and advancement of an 0.018 wire into the gallbladder lumen. The needle was exchanged for an Wilmot set. A small amount of contrast was injected to confirm appropriate intraluminal positioning. Over a Benson wire, a 63.2-French Cook cholecystomy tube was advanced into the gallbladder fossa, coiled and locked. Bile was aspirated and a small amount of contrast was injected as several post procedural spot radiographic images were obtained in various obliquities. The catheter was secured to the skin with suture, connected to a drainage bag and a dressing was placed. The patient tolerated the procedure well without immediate post procedural complication.  IMPRESSION: Status post image guided percutaneous cholecystostomy. Sample sent to the lab for analysis.  Signed,  Dulcy Fanny. Earleen Newport, DO  [facility]  [facility]   Electronically Signed   By: Corrie Mckusick D.O.   On: 05/01/2015 18:11   Dg Chest Port 1 View  05/04/2015   CLINICAL DATA:  Wheezing and acute respiratory failure  EXAM: PORTABLE CHEST -  1 VIEW  COMPARISON:  May 02, 2015 chest radiograph; chest CT September 17, 2012  FINDINGS: Changes of underlying COPD remain, documented on prior CT. Fibrotic type change in the lower lung zones is stable. There is mild atelectasis in the right base, less than on study from 2 days prior. No frank edema or consolidation. Heart size is within normal limits. The pulmonary vascular is within normal limits. No adenopathy. Drain in the right lateral abdomen is stable.  IMPRESSION: Less  atelectasis on the right compared to 2 days prior. Underlying interstitial fibrotic type change and COPD as documented on prior CT examination. No edema or consolidation. No change in cardiac silhouette.   Electronically Signed   By: Lowella Grip III M.D.   On: 05/04/2015 09:34   Dg Chest Port 1 View  05/02/2015   CLINICAL DATA:  Acute respiratory failure, hypoxia  EXAM: PORTABLE CHEST - 1 VIEW  COMPARISON:  04/30/2015  FINDINGS: Heart size at upper limits of normal. Lungs are hypoaerated with crowding of the bronchovascular markings. Increased interstitial markings and patchy bibasilar airspace opacities are noted. Increased small pleural effusions. No pneumothorax. Pigtail catheter projects over the right upper quadrant.  IMPRESSION: Borderline cardiomegaly with increased interstitial markings which may indicate development of interstitial edema.  New patchy bibasilar airspace opacities could indicate atelectasis given the low lung volumes although pneumonia or asymmetric edema could appear similar.   Electronically Signed   By: Conchita Paris M.D.   On: 05/02/2015 07:35    Microbiology: Recent Results (from the past 240 hour(s))  MRSA PCR Screening     Status: Abnormal   Collection Time: 04/30/15 12:00 AM  Result Value Ref Range Status   MRSA by PCR POSITIVE (A) NEGATIVE Final    Comment:        The GeneXpert MRSA Assay (FDA approved for NASAL specimens only), is one component of a comprehensive MRSA colonization surveillance program. It is not intended to diagnose MRSA infection nor to guide or monitor treatment for MRSA infections. RESULT CALLED TO, READ BACK BY AND VERIFIED WITH: A [email or web address]  05/01/15 MKELLY   Culture, blood (routine x 2)     Status: None (Preliminary result)   Collection Time: 04/30/15  8:36 PM  Result Value Ref Range Status   Specimen Description BLOOD RIGHT FOREARM  Final   Special Requests BOTTLES DRAWN AEROBIC AND ANAEROBIC 5CC  Final   Culture NO  GROWTH 4 DAYS  Final   Report Status PENDING  Incomplete  Culture, blood (routine x 2)     Status: None (Preliminary result)   Collection Time: 04/30/15  8:42 PM  Result Value Ref Range Status   Specimen Description BLOOD LEFT ARM  Final   Special Requests BOTTLES DRAWN AEROBIC AND ANAEROBIC 5CC  Final   Culture NO GROWTH 4 DAYS  Final   Report Status PENDING  Incomplete  Urine culture     Status: None   Collection Time: 04/30/15  9:23 PM  Result Value Ref Range Status   Specimen Description URINE, CLEAN CATCH  Final   Special Requests NONE  Final   Culture >=100,000 COLONIES/mL CITROBACTER KOSERI  Final   Report Status 05/03/2015 FINAL  Final   Organism ID, Bacteria CITROBACTER KOSERI  Final      Susceptibility   Citrobacter koseri - MIC*    CEFAZOLIN <=4 SENSITIVE Sensitive     CEFTRIAXONE <=1 SENSITIVE Sensitive     CIPROFLOXACIN <=0.25 SENSITIVE Sensitive     GENTAMICIN <=1 SENSITIVE Sensitive  IMIPENEM <=0.25 SENSITIVE Sensitive     NITROFURANTOIN 32 SENSITIVE Sensitive     TRIMETH/SULFA <=20 SENSITIVE Sensitive     PIP/TAZO <=4 SENSITIVE Sensitive     * >=100,000 COLONIES/mL CITROBACTER KOSERI  Culture, routine-abscess     Status: None   Collection Time: 05/01/15  6:55 PM  Result Value Ref Range Status   Specimen Description ABSCESS  Final   Special Requests GALLBLADDER  Final   Gram Stain   Final    ABUNDANT WBC PRESENT,BOTH PMN AND MONONUCLEAR NO SQUAMOUS EPITHELIAL CELLS SEEN FEW GRAM NEGATIVE RODS Performed at [facility]    Culture   Final    ABUNDANT ESCHERICHIA COLI Performed at [facility]    Report Status 05/05/2015 FINAL  Final   Organism ID, Bacteria ESCHERICHIA COLI  Final      Susceptibility   Escherichia coli - MIC*    AMPICILLIN >=32 RESISTANT Resistant     AMPICILLIN/SULBACTAM >=32 RESISTANT Resistant     CEFEPIME <=1 SENSITIVE Sensitive     CEFTAZIDIME <=1 SENSITIVE Sensitive     CEFTRIAXONE <=1 SENSITIVE Sensitive      CIPROFLOXACIN <=0.25 SENSITIVE Sensitive     GENTAMICIN 2 SENSITIVE Sensitive     IMIPENEM <=0.25 SENSITIVE Sensitive     PIP/TAZO <=4 SENSITIVE Sensitive     TOBRAMYCIN 2 SENSITIVE Sensitive     TRIMETH/SULFA <=20 SENSITIVE Sensitive     * ABUNDANT ESCHERICHIA COLI     Labs: Basic Metabolic Panel:  Recent Labs Lab 05/01/15 0615 05/02/15 0142 05/03/15 0304 05/04/15 0245 05/04/15 0711 05/05/15 0210  NA 134* 132* 140 141  --  141  K 3.3* 3.6 3.2* 3.3*  --  3.5  CL 105 104 107 107  --  108  CO2 21* 19* 23 26  --  26  GLUCOSE 124* 161* 148* 154*  --  134*  BUN 10 12 19  24*  --  26*  CREATININE 0.87 0.85 0.87 1.10  --  1.03  CALCIUM 8.2* 8.3* 8.8* 8.5*  --  8.4*  MG  --   --   --   --  2.1  --    Liver Function Tests:  Recent Labs Lab 05/01/15 0615 05/02/15 0142 05/03/15 0304 05/04/15 0245 05/05/15 0210  AST 39 46* 99* 107* 36  ALT 34 36 85* 133* 87*  ALKPHOS 57 61 65 65 52  BILITOT 1.5* 1.2 1.2 0.6 0.8  PROT 5.9* 6.0* 5.8* 5.6* 5.2*  ALBUMIN 2.7* 2.5* 2.3* 2.3* 2.3*    Recent Labs Lab 05/01/15 0020  LIPASE 24   No results for input(s): AMMONIA in the last 168 hours. CBC:  Recent Labs Lab 04/30/15 1928  05/01/15 0615 05/02/15 0142 05/03/15 0304 05/04/15 0245 05/05/15 0210  WBC 26.4*  < > 23.2* 22.8* 21.8* 16.8* 16.0*  NEUTROABS 22.2*  --  20.1*  --   --   --   --   HGB 14.5  < > 13.4 13.9 13.6 14.0 13.5  HCT 41.1  < > 38.7* 40.0 38.5* 39.5 39.9  MCV 88.6  < > 89.4 89.5 89.1 88.6 90.5  PLT 242  < > 217 228 260 282 276  < > = values in this interval not displayed. Cardiac Enzymes:  Recent Labs Lab 05/01/15 0615 05/01/15 1433 05/01/15 1950 05/02/15 0142 05/02/15 0810  TROPONINI 0.07* 0.27* 0.12* 0.13* 0.16*   BNP: BNP (last 3 results)  Recent Labs  05/01/15 0020  BNP 240.3*    ProBNP (last  3 results) No results for input(s): PROBNP in the last 8760 hours.  CBG: No results for input(s): GLUCAP in the last 168 hours.  Time  coordinating discharge: 35 minutes  Signed:  Omran Keelin  [facility] 05/05/2015, 9:25 AM

## 2015-05-05 NOTE — Significant Event (Signed)
Patient has been placed on room air. Saturations 93-96% on room air. Will monitor prior to discharge

## 2015-05-05 NOTE — Progress Notes (Signed)
OT Cancellation Note  Patient Details Name: Lee [name] MRN: [id number] DOB: 02-19-1931   Cancelled Treatment:    Reason Eval/Treat Not Completed: Other (comment). Pt to D/C today and HHOT has been set up. Will defer OT eval to that agency.  Almon Register [phone] 05/05/2015, 12:21 PM

## 2015-05-05 NOTE — Progress Notes (Signed)
Spoke w pt. Went over [employer]. No pref. Ref to [facility] for hhrn,pt-ot. Have left vm katie w [facility] dme for rolling walker to room prior to disch. Da here to pick pt up.

## 2015-05-05 NOTE — Significant Event (Addendum)
Patient is to be discharge home. Reviewed discharge instruction thoroughly with patient, his spouse and daughter at bedside. Patient and family verbalize understanding of AVS. A copy of AVS is given to patient to take home. Questions were answered. Family view drain site with RN, educated on signs and symptoms of infection. Family and spouse verbalize understanding of his medications. Patient remained on room saturations 92-96% with exertion. Patient educated that he can resume his O2 at home as instructed on AVS.   1235pm-VS stable prior to discharge. Patient taken to transportation by NT. Taken home by spouse and daughter.

## 2015-05-05 NOTE — Progress Notes (Signed)
SUBJECTIVE: The patient is doing well today.  At this time, he denies chest pain, shortness of breath, or any new concerns.  [name] amLODipine  5 mg Oral Daily  . antiseptic oral rinse  7 mL Mouth Rinse BID  . enoxaparin (LOVENOX) injection  40 mg Subcutaneous Q24H  . ezetimibe  5 mg Oral Daily  . Influenza vac split quadrivalent PF  0.5 mL Intramuscular Tomorrow-1000  . levalbuterol  1.25 mg Nebulization Q6H  . losartan  50 mg Oral Daily  . methylPREDNISolone (SOLU-MEDROL) injection  60 mg Intravenous BID  . metoprolol tartrate  12.5 mg Oral BID  . mupirocin ointment  1 application Nasal BID  . niacin  500 mg Oral QHS  . pantoprazole  40 mg Oral Daily  . piperacillin-tazobactam (ZOSYN)  IV  3.375 g Intravenous Q8H  . pravastatin  40 mg Oral q1800  . tiotropium  18 mcg Inhalation Daily      OBJECTIVE: Physical Exam: Filed Vitals:   05/05/15 0400 05/05/15 0742 05/05/15 0800 05/05/15 0818  BP: 150/68 128/64    Pulse: 81 82 107 101  Temp: 97.3 F (36.3 C) 97.8 F (36.6 C)    TempSrc: Oral Oral    Resp: 20 24 24    Height:      Weight: 87.8 kg (193 lb 9 oz)     SpO2: 93% 95% 90% 94%    Intake/Output Summary (Last 24 hours) at 05/05/15 0823 Last data filed at 05/05/15 0815  Gross per 24 hour  Intake    600 ml  Output   1465 ml  Net   -865 ml    Telemetry reveals sinus rhythm  GEN- The patient is well appearing, alert and oriented x 3 today.   Head- normocephalic, atraumatic Eyes-  Sclera clear, conjunctiva pink Ears- hearing intact Oropharynx- clear Neck- supple,  Lungs- Dry crackles at R base, normal work of breathing Heart- Regular rate and rhythm, no murmurs, rubs or gallops, PMI not laterally displaced GI- soft, NT, ND, + BS Extremities- no clubbing, cyanosis, or edema Skin- no rash or lesion  LABS: Basic Metabolic Panel:  Recent Labs  05/04/15 0245 05/04/15 0711 05/05/15 0210  NA 141  --  141  K 3.3*  --  3.5  CL 107  --  108  CO2 26  --  26    GLUCOSE 154*  --  134*  BUN 24*  --  26*  CREATININE 1.10  --  1.03  CALCIUM 8.5*  --  8.4*  MG  --  2.1  --    Liver Function Tests:  Recent Labs  05/04/15 0245 05/05/15 0210  AST 107* 36  ALT 133* 87*  ALKPHOS 65 52  BILITOT 0.6 0.8  PROT 5.6* 5.2*  ALBUMIN 2.3* 2.3*   No results for input(s): LIPASE, AMYLASE in the last 72 hours. CBC:  Recent Labs  05/04/15 0245 05/05/15 0210  WBC 16.8* 16.0*  HGB 14.0 13.5  HCT 39.5 39.9  MCV 88.6 90.5  PLT 282 276   Cardiac Enzymes: No results for input(s): CKTOTAL, CKMB, CKMBINDEX, TROPONINI in the last 72 hours.  ASSESSMENT AND PLAN:  Principal Problem:   Sepsis Active Problems:   IPF (idiopathic pulmonary fibrosis)   Hypertension   Acute renal failure (ARF)   Abdominal pain   Acute respiratory failure with hypoxia   HLD (hyperlipidemia)   CAP (community acquired pneumonia)   Elevated troponin   Cholecystitis, acute   LV dysfunction   UTI (  urinary tract infection)   Acute systolic congestive heart failure   Demand ischemia  1. Elevated troponin: Likely demand ischemia due to metabolic illness/ lactic acidosis.  Not an acute MI.  He does have a mildly reduced EF with WMA on echo.   - Will start aspirin 81mg  daily.  Patient was on  plavix at home and is unsure why.  Will hold plavix as he may need surgery - Lexiscan Myoview in office post discharge and prior to surgery for risk stratification  2. Reduced EF: Appears mildly volume overloaded on exam.  Agree with holding IV lasix today and likely start po lasix tomorrow. - Continue losartan - Will switch metoprolol to coreg 12.5mg  bid - Will stop amlodipine in order to titrate medications with mortality benefit (losartan, carvedilol) - Keep K>4  3. HTN: Switching metoprolol to carvedilol 12.5mg  for heart failure and improved BP control.  4. HL: Would hold statin given worsening transaminitis.  Improving today.  May be able to start statin tomorrow.  Will arrange  for outpatient Cardiology follow up in 1 month.  Sharol Harness, MD 05/05/2015 8:23 AM

## 2015-05-05 NOTE — Significant Event (Signed)
Walker for patient to take home is at bedside for patient to take home. Will proceed with discharge.

## 2015-05-13 ENCOUNTER — Encounter: Payer: Self-pay | Admitting: Physician Assistant

## 2015-05-13 ENCOUNTER — Ambulatory Visit (INDEPENDENT_AMBULATORY_CARE_PROVIDER_SITE_OTHER): Payer: Medicare Other | Admitting: Physician Assistant

## 2015-05-13 VITALS — BP 124/60 | HR 65 | Ht 66.0 in | Wt 191.6 lb

## 2015-05-13 DIAGNOSIS — I5022 Chronic systolic (congestive) heart failure: Secondary | ICD-10-CM | POA: Diagnosis not present

## 2015-05-13 DIAGNOSIS — R740 Nonspecific elevation of levels of transaminase and lactic acid dehydrogenase [LDH]: Secondary | ICD-10-CM

## 2015-05-13 DIAGNOSIS — I447 Left bundle-branch block, unspecified: Secondary | ICD-10-CM

## 2015-05-13 DIAGNOSIS — J9610 Chronic respiratory failure, unspecified whether with hypoxia or hypercapnia: Secondary | ICD-10-CM

## 2015-05-13 DIAGNOSIS — R7989 Other specified abnormal findings of blood chemistry: Secondary | ICD-10-CM

## 2015-05-13 DIAGNOSIS — I639 Cerebral infarction, unspecified: Secondary | ICD-10-CM

## 2015-05-13 DIAGNOSIS — R7401 Elevation of levels of liver transaminase levels: Secondary | ICD-10-CM

## 2015-05-13 DIAGNOSIS — R778 Other specified abnormalities of plasma proteins: Secondary | ICD-10-CM

## 2015-05-13 NOTE — Patient Instructions (Addendum)
Medication Instructions:  Your physician recommends that you continue on your current medications as directed. Please refer to the Current Medication list given to you today.   Labwork: None ordered  Testing/Procedures: Your physician has requested that you have a lexiscan myoview. For further information please visit [email or web address]. Please follow instruction sheet, as given.    Follow-Up: Your physician recommends that you schedule a follow-up appointment in: 4 MONTHS WITH DR. Mare Ferrari     Any Other Special Instructions Will Be Listed Below (If Applicable).  Pharmacologic Stress Electrocardiogram A pharmacologic stress electrocardiogram is a heart (cardiac) test that uses nuclear imaging to evaluate the blood supply to your heart. This test may also be called a pharmacologic stress electrocardiography. Pharmacologic means that a medicine is used to increase your heart rate and blood pressure.  This stress test is done to find areas of poor blood flow to the heart by determining the extent of coronary artery disease (CAD). Some people exercise on a treadmill, which naturally increases the blood flow to the heart. For those people unable to exercise on a treadmill, a medicine is used. This medicine stimulates your heart and will cause your heart to beat harder and more quickly, as if you were exercising.  Pharmacologic stress tests can help determine:  The adequacy of blood flow to your heart during increased levels of activity in order to clear you for discharge home.  The extent of coronary artery blockage caused by CAD.  Your prognosis if you have suffered a heart attack.  The effectiveness of cardiac procedures done, such as an angioplasty, which can increase the circulation in your coronary arteries.  Causes of chest pain or pressure. LET [facility] CARE PROVIDER KNOW ABOUT:  Any allergies you have.  All medicines you are taking, including vitamins, herbs, eye drops,  creams, and over-the-counter medicines.  Previous problems you or members of your family have had with the use of anesthetics.  Any blood disorders you have.  Previous surgeries you have had.  Medical conditions you have.  Possibility of pregnancy, if this applies.  If you are currently breastfeeding. RISKS AND COMPLICATIONS Generally, this is a safe procedure. However, as with any procedure, complications can occur. Possible complications include:  You develop pain or pressure in the following areas:  Chest.  Jaw or neck.  Between your shoulder blades.  Radiating down your left arm.  Headache.  Dizziness or light-headedness.  Shortness of breath.  Increased or irregular heartbeat.  Low blood pressure.  Nausea or vomiting.  Flushing.  Redness going up the arm and slight pain during injection of medicine.  Heart attack (rare). BEFORE THE PROCEDURE   Avoid all forms of caffeine for 24 hours before your test or as directed by your health care provider. This includes coffee, tea (even decaffeinated tea), caffeinated sodas, chocolate, cocoa, and certain pain medicines.  Follow your health care provider's instructions regarding eating and drinking before the test.  Take your medicines as directed at regular times with water unless instructed otherwise. Exceptions may include:  If you have diabetes, ask how you are to take your insulin or pills. It is common to adjust insulin dosing the morning of the test.  If you are taking beta-blocker medicines, it is important to talk to your health care provider about these medicines well before the date of your test. Taking beta-blocker medicines may interfere with the test. In some cases, these medicines need to be changed or stopped 24 hours or more before the  test.  If you wear a nitroglycerin patch, it may need to be removed prior to the test. Ask your health care provider if the patch should be removed before the  test.  If you use an inhaler for any breathing condition, bring it with you to the test.  If you are an outpatient, bring a snack so you can eat right after the stress phase of the test.  Do not smoke for 4 hours prior to the test or as directed by your health care provider.  Do not apply lotions, powders, creams, or oils on your chest prior to the test.  Wear comfortable shoes and clothing. Let your health care provider know if you were unable to complete or follow the preparations for your test. PROCEDURE   Multiple patches (electrodes) will be put on your chest. If needed, small areas of your chest may be shaved to get better contact with the electrodes. Once the electrodes are attached to your body, multiple wires will be attached to the electrodes, and your heart rate will be monitored.  An IV access will be started. A nuclear trace (isotope) is given. The isotope may be given intravenously, or it may be swallowed. Nuclear refers to several types of radioactive isotopes, and the nuclear isotope lights up the arteries so that the nuclear images are clear. The isotope is absorbed by your body. This results in low radiation exposure.  A resting nuclear image is taken to show how your heart functions at rest.  A medicine is given through the IV access.  A second scan is done about 1 hour after the medicine injection and determines how your heart functions under stress.  During this stress phase, you will be connected to an electrocardiogram machine. Your blood pressure and oxygen levels will be monitored. AFTER THE PROCEDURE   Your heart rate and blood pressure will be monitored after the test.  You may return to your normal schedule, including diet,activities, and medicines, unless your health care provider tells you otherwise. Document Released: 12/25/2008 Document Revised: 08/13/2013 Document Reviewed: 04/15/2013 [facility] Patient Information 2015 [place], [place]. This information  is not intended to replace advice given to you by your health care provider. Make sure you discuss any questions you have with your health care provider.

## 2015-05-13 NOTE — Progress Notes (Signed)
Cardiology Office Note Date:  05/13/2015  Patient ID:  Lee [name], Lee [name] 12/10/1930, MRN [id number] PCP:  Jerlyn Ly, MD  Cardiologist:  Dr. Mare Ferrari   Chief Complaint: f/u hospital stay for sepsis, CHF, troponin elevation  History of Present Illness: Lee [name] is a 81 y.o. male with history of interstitial lung disease, abdominal aortic aneurysm status post repair 2002, hyperlipidemia, hypertension, left foot drop, COPD with chronic respiratory failure on home O2 QHS, TIAs/stroke, recently diagnosed systolic CHF and elevated troponin who presents for post-hospital follow-up. He was admitted 9/8-9/13 with sepsis due to acute Escherichia coli cholecystitis and right lower lobe CAP and Citrobacter UTI.He had cholecystostomy drain on 9/9 by interventional radiology with plans for eventual lap chole once recovered if there is evidence of gallstone. During admission he also had acute hypoxic respiratory failure due to acute COPD exacerbation and acute systolic heart failure. He was started on Solu-Medrol, abx, breathing treatments and IV Lasix. Hospital course also complicated by AKI due to sepsis/dehydration resolved with IV fluids, leukocytosis, hyponatremia (resolved with diuresis), mild transaminitis, and generalized weakness due to severe illness. From a cardiac standpoint he had elevated troponin felt likely due to sepsis/tachycardia with flat troponins. 2D Echo showed mild LVH, EF 40-45%, akinesis of mid-apical anteroseptal myocardium, mild MR, PASP 62mmHg (prior EF 50-55% in 2013). He was also noted to have a new left bundle branch block on EKG compared to 09/2013, although office note by Dr. Mare Ferrari 07/2012 reports a LBBB of unknown origin at which time stress testing was negative. Metoprolol was changed to Coreg for improved BP control. Dr. Oval Linsey recommended outpatient followup with [facility] nuclear stress test once improved. Home Plavix was resumed (has been on this by PCP for  history of TIA/stroke). Pertinent labs include Cr 1.03 at discharge, albumin 2.3, WBC 16 (peak 26), peak troponin 0.27.  He comes in today for follow-up. He is doing well from cardiac standpoint. No CP, SOB, palpitations, syncope. Abdominal pain is better. He has perc drain in place. He is working with PT and OT at home and gradually building strength. He uses both a walker and cane to ambulate at home.   Past Medical History  Diagnosis Date  . Interstitial lung disease     a. followed by pulm.  [name] HTN (hypertension)   . AAA (abdominal aortic aneurysm)     a. s/p repair 2002.  . Allergic rhinitis   . Erectile dysfunction   . Hyperlipidemia   . Obesity   . PVD (peripheral vascular disease)   . Basal cell carcinoma of face   . LBBB (left bundle branch block)     a. Intermittent - seen in 2013, not in 09/2013, but again in 04/2015.  [name] COPD (chronic obstructive pulmonary disease)   . Chronic respiratory failure     a. on home O2 after discharge 04/2015.  [name] Chronic systolic CHF (congestive heart failure)     a. Dx 04/2015 during complex admission for cholecystitis - EF 40-45% +WMA.  . Cholecystitis     a. complex admit 04/2015 due to sepsis due to acute Escherichia coli cholecystitis and right lower lobe CAP and Citrobacter UTI.    Past Surgical History  Procedure Laterality Date  . Aorta surgery  2002  . Hernia repair  1979  . Urinary tract stretch      late 70s    Current Outpatient Prescriptions  Medication Sig Dispense Refill  . albuterol (PROVENTIL HFA;VENTOLIN HFA) 108 (90 BASE) MCG/ACT inhaler  Inhale 2 puffs into the lungs every 6 (six) hours as needed for wheezing or shortness of breath. 1 Inhaler 0  . carvedilol (COREG) 12.5 MG tablet Take 1 tablet (12.5 mg total) by mouth 2 (two) times daily with a meal. 60 tablet 0  . clopidogrel (PLAVIX) 75 MG tablet Take 75 mg by mouth daily.   6  . ezetimibe (ZETIA) 10 MG tablet Take 5 mg by mouth daily.     [name] losartan (COZAAR) 50 MG  tablet Take 50 mg by mouth daily.    . nitroGLYCERIN (NITROSTAT) 0.4 MG SL tablet Place 1 tablet (0.4 mg total) under the tongue every 5 (five) minutes as needed for chest pain. 30 tablet 0  . pantoprazole (PROTONIX) 40 MG tablet Take 40 mg by mouth daily.  11  . Pitavastatin Calcium (LIVALO) 2 MG TABS Take 2 mg by mouth See admin instructions. Every 3rd day    . sodium chloride 0.9 % injection Inject 10 mLs into the vein as needed. 1000 mL 0  . sodium chloride 0.9 % injection Inject 10 mLs into the vein once.   0  . tiotropium (SPIRIVA) 18 MCG inhalation capsule Place 1 capsule (18 mcg total) into inhaler and inhale daily. 30 capsule 0   No current facility-administered medications for this visit.    Allergies:   Ciprofloxacin   Social History:  The patient  reports that he quit smoking about 40 years ago. His smoking use included Cigarettes. He has a 25 pack-year smoking history. He has never used smokeless tobacco. He reports that he does not drink alcohol or use illicit drugs.   Family History:  The patient's family history includes Colon cancer in his brother; Heart disease in his father; Hypertension in his mother; Stroke in his father.   ROS:  Please see the history of present illness. All other systems are reviewed and otherwise negative.   PHYSICAL EXAM:  VS:  BP 124/60 mmHg  Pulse 65  Ht 5\' 6"  (1.676 m)  Wt 191 lb 9.6 oz (86.909 kg)  BMI 30.94 kg/m2  SpO2 99% BMI: Body mass index is 30.94 kg/(m^2). Well nourished, well developed WM, in no acute distress HEENT: normocephalic, atraumatic Neck: no JVD, carotid bruits or masses Cardiac:  normal S1, S2; RRR; no murmurs, rubs, or gallops Lungs:  clear to auscultation bilaterally, no wheezing, rhonchi or rales Abd: soft, nontender, no hepatomegaly, + BS - perc drain in place with minimal bilious filling in bag MS: no deformity or atrophy Ext: no edema Skin: warm and dry, no rash Neuro:  moves all extremities spontaneously, no  focal abnormalities noted, follows commands Psych: euthymic mood, full affect   EKG:  Done today shows NSR 65bpm, left axis deviation, LBBB  Recent Labs: 05/01/2015: B Natriuretic Peptide 240.3*; TSH 0.878 05/04/2015: Magnesium 2.1 05/05/2015: ALT 87*; BUN 26*; Creatinine, Ser 1.03; Hemoglobin 13.5; Platelets 276; Potassium 3.5; Sodium 141  No results found for requested labs within last 365 days.   Estimated Creatinine Clearance: 57.1 mL/min (by C-G formula based on Cr of 1.03).   Wt Readings from Last 3 Encounters:  05/13/15 191 lb 9.6 oz (86.909 kg)  05/05/15 193 lb 9 oz (87.8 kg)  02/26/15 194 lb 3.2 oz (88.089 kg)     Other studies reviewed: Additional studies/records reviewed today include: summarized above  ASSESSMENT AND PLAN:  1. Chronic systolic CHF (recent diagnosis) - appears euvolemic by exam. Continue BB/ARB. Recent + WMA on echocardiogram with abnormal troponins. Unclear if  related to acute stress of medical illness versus underlying CAD. He has several cardiac risk factors. He is asymptomatic from cardiac standpoint but I agree with Dr. Oval Linsey that he would benefit from ischemic testing, particularly since he may undergo gallbladder surgery in the near future. Will proceed with Lexiscan nuclear stress test. He and his wife prefer to do this in 1-2 weeks after he's had a chance to build further strength. 2. Elevated troponin - see above. He is on Plavix for h/o TIA. If nuclear stress test is positive will need to consider adding aspirin. Continue BB/statin. 3. LBBB - felt to be new during recent hospitalization but notes from 2013 indicate LBBB as well. This is likely intermittent as EKG in 09/2013 did not demonstrate such. 4. Chronic respiratory failure with COPD/pulmonary fibrosis - followed by pulmonology. O2 sat 99% on RA in clinic today. He uses oxygen QHS. His wife also keeps an eye on O2 at home as well. 5. Transaminitis in setting of cholecystitis - trending down by  last check during recent hospitalization.  Disposition: F/u with Dr. Mare Ferrari in 4 months, sooner if needed.  Current medicines are reviewed at length with the patient today.  The patient did not have any concerns regarding medicines.  Raechel Ache PA-C 05/13/2015 12:08 PM     [address] [facility] [place] [address] [phone] (office)  [phone] (fax)

## 2015-05-23 HISTORY — PX: NM MYOVIEW LTD: HXRAD82

## 2015-05-26 ENCOUNTER — Telehealth (HOSPITAL_COMMUNITY): Payer: Self-pay | Admitting: *Deleted

## 2015-05-26 NOTE — Telephone Encounter (Signed)
Spoke with Patient wife given detailed instructions per Myocardial Perfusion Study Information Sheet for test on 05/28/15 at 1000. Patient notified to arrive 15 minutes early and that it is imperative to arrive on time for appointment to keep from having the test rescheduled.  If you need to cancel or reschedule your appointment, please call the office within 24 hours of your appointment. Failure to do so may result in a cancellation of your appointment, and a $50 no show fee. Patient verbalized understanding. Alara Daniel, Ranae Palms

## 2015-05-28 ENCOUNTER — Ambulatory Visit (HOSPITAL_COMMUNITY): Payer: Medicare Other | Attending: Physician Assistant

## 2015-05-28 DIAGNOSIS — R778 Other specified abnormalities of plasma proteins: Secondary | ICD-10-CM

## 2015-05-28 DIAGNOSIS — R7989 Other specified abnormal findings of blood chemistry: Secondary | ICD-10-CM | POA: Diagnosis present

## 2015-05-28 DIAGNOSIS — I10 Essential (primary) hypertension: Secondary | ICD-10-CM | POA: Insufficient documentation

## 2015-05-28 DIAGNOSIS — I447 Left bundle-branch block, unspecified: Secondary | ICD-10-CM | POA: Insufficient documentation

## 2015-05-28 DIAGNOSIS — R5383 Other fatigue: Secondary | ICD-10-CM | POA: Diagnosis not present

## 2015-05-28 DIAGNOSIS — R9439 Abnormal result of other cardiovascular function study: Secondary | ICD-10-CM | POA: Diagnosis not present

## 2015-05-28 DIAGNOSIS — I5021 Acute systolic (congestive) heart failure: Secondary | ICD-10-CM

## 2015-05-28 LAB — MYOCARDIAL PERFUSION IMAGING
CHL CUP NUCLEAR SDS: 1
CSEPPHR: 99 {beats}/min
LHR: 0.38
LV dias vol: 68 mL
LVSYSVOL: 35 mL
Rest HR: 77 {beats}/min
SRS: 6
SSS: 7
TID: 0.93

## 2015-05-28 MED ORDER — REGADENOSON 0.4 MG/5ML IV SOLN
0.4000 mg | Freq: Once | INTRAVENOUS | Status: AC
  Administered 2015-05-28: 0.4 mg via INTRAVENOUS

## 2015-05-28 MED ORDER — TECHNETIUM TC 99M SESTAMIBI GENERIC - CARDIOLITE
10.8000 | Freq: Once | INTRAVENOUS | Status: AC | PRN
  Administered 2015-05-28: 11 via INTRAVENOUS

## 2015-05-28 MED ORDER — TECHNETIUM TC 99M SESTAMIBI GENERIC - CARDIOLITE
32.4000 | Freq: Once | INTRAVENOUS | Status: AC | PRN
  Administered 2015-05-28: 32 via INTRAVENOUS

## 2015-05-29 ENCOUNTER — Encounter (HOSPITAL_COMMUNITY): Payer: Self-pay

## 2015-05-29 ENCOUNTER — Emergency Department (HOSPITAL_COMMUNITY)
Admission: EM | Admit: 2015-05-29 | Discharge: 2015-05-29 | Disposition: A | Discharge status: Home / Self Care | Payer: Medicare Other | Attending: Emergency Medicine | Admitting: Emergency Medicine

## 2015-05-29 ENCOUNTER — Other Ambulatory Visit: Payer: Self-pay

## 2015-05-29 ENCOUNTER — Emergency Department (HOSPITAL_COMMUNITY): Payer: Medicare Other

## 2015-05-29 DIAGNOSIS — Z7902 Long term (current) use of antithrombotics/antiplatelets: Secondary | ICD-10-CM | POA: Diagnosis not present

## 2015-05-29 DIAGNOSIS — E669 Obesity, unspecified: Secondary | ICD-10-CM | POA: Diagnosis not present

## 2015-05-29 DIAGNOSIS — R079 Chest pain, unspecified: Secondary | ICD-10-CM | POA: Insufficient documentation

## 2015-05-29 DIAGNOSIS — I10 Essential (primary) hypertension: Secondary | ICD-10-CM | POA: Insufficient documentation

## 2015-05-29 DIAGNOSIS — R1011 Right upper quadrant pain: Secondary | ICD-10-CM

## 2015-05-29 DIAGNOSIS — J441 Chronic obstructive pulmonary disease with (acute) exacerbation: Secondary | ICD-10-CM | POA: Diagnosis not present

## 2015-05-29 DIAGNOSIS — Z8673 Personal history of transient ischemic attack (TIA), and cerebral infarction without residual deficits: Secondary | ICD-10-CM | POA: Insufficient documentation

## 2015-05-29 DIAGNOSIS — Z87891 Personal history of nicotine dependence: Secondary | ICD-10-CM | POA: Insufficient documentation

## 2015-05-29 DIAGNOSIS — E785 Hyperlipidemia, unspecified: Secondary | ICD-10-CM | POA: Insufficient documentation

## 2015-05-29 DIAGNOSIS — I5022 Chronic systolic (congestive) heart failure: Secondary | ICD-10-CM | POA: Diagnosis not present

## 2015-05-29 DIAGNOSIS — Z79899 Other long term (current) drug therapy: Secondary | ICD-10-CM | POA: Diagnosis not present

## 2015-05-29 LAB — HEPATIC FUNCTION PANEL
ALBUMIN: 2.8 g/dL — AB (ref 3.5–5.0)
ALT: 84 U/L — ABNORMAL HIGH (ref 17–63)
AST: 48 U/L — AB (ref 15–41)
Alkaline Phosphatase: 73 U/L (ref 38–126)
BILIRUBIN INDIRECT: 0.5 mg/dL (ref 0.3–0.9)
BILIRUBIN TOTAL: 0.7 mg/dL (ref 0.3–1.2)
Bilirubin, Direct: 0.2 mg/dL (ref 0.1–0.5)
Total Protein: 5.5 g/dL — ABNORMAL LOW (ref 6.5–8.1)

## 2015-05-29 LAB — CBC
HEMATOCRIT: 38.3 % — AB (ref 39.0–52.0)
HEMOGLOBIN: 13 g/dL (ref 13.0–17.0)
MCH: 31.1 pg (ref 26.0–34.0)
MCHC: 33.9 g/dL (ref 30.0–36.0)
MCV: 91.6 fL (ref 78.0–100.0)
Platelets: 207 10*3/uL (ref 150–400)
RBC: 4.18 MIL/uL — ABNORMAL LOW (ref 4.22–5.81)
RDW: 14.2 % (ref 11.5–15.5)
WBC: 7.2 10*3/uL (ref 4.0–10.5)

## 2015-05-29 LAB — BASIC METABOLIC PANEL
ANION GAP: 8 (ref 5–15)
BUN: 9 mg/dL (ref 6–20)
CALCIUM: 8.7 mg/dL — AB (ref 8.9–10.3)
CO2: 24 mmol/L (ref 22–32)
Chloride: 104 mmol/L (ref 101–111)
Creatinine, Ser: 0.98 mg/dL (ref 0.61–1.24)
GFR calc Af Amer: >60 mL/min (ref >60)
Glucose, Bld: 116 mg/dL — ABNORMAL HIGH (ref 65–99)
POTASSIUM: 4.1 mmol/L (ref 3.5–5.1)
SODIUM: 136 mmol/L (ref 135–145)

## 2015-05-29 LAB — PROTIME-INR
INR: 1.15 (ref 0.00–1.49)
PROTHROMBIN TIME: 14.9 s (ref 11.6–15.2)

## 2015-05-29 LAB — I-STAT TROPONIN, ED: Troponin i, poc: 0.01 ng/mL (ref 0.00–0.08)

## 2015-05-29 LAB — LIPASE, BLOOD: LIPASE: 50 U/L (ref 22–51)

## 2015-05-29 NOTE — ED Notes (Signed)
Per EMS, pt recently in the hospital and had a drain put in his gallbladder, pt had stress test today and his dr said that while he was in the hospital he thought he had an MI, pt was getting in bed tonight and had central chest pain and ruq abd pain with thoracic back pain. Pt states he no longer has cp on arrival and the only pain he has is at the drain site. 12 lead showed LBBB and 1st degree block. Pt received 324 ASA with EMS. Pt has 22ga in left forearm. EMS VS 96% on RA, BP 130/62, HR 70, RR 16. Pt alert and oriented x 4. NAD.

## 2015-05-29 NOTE — Discharge Instructions (Signed)
Your caregiver has diagnosed you as having chest pain that is not specific for one problem, but does not require admission.  Chest pain comes from many different causes.  SEEK IMMEDIATE MEDICAL ATTENTION IF: You have severe chest pain, especially if the pain is crushing or pressure-like and spreads to the arms, back, neck, or jaw, or if you have sweating, nausea (feeling sick to your stomach), or shortness of breath. THIS IS AN EMERGENCY. Don't wait to see if the pain will go away. Get medical help at once. Call 911 or 0 (operator). DO NOT drive yourself to the hospital.  Your chest pain gets worse and does not go away with rest.  You have an attack of chest pain lasting longer than usual, despite rest and treatment with the medications your caregiver has prescribed.  You wake from sleep with chest pain or shortness of breath.  You feel dizzy or faint.  You have chest pain not typical of your usual pain for which you originally saw your caregiver.   SEEK IMMEDIATE MEDICAL ATTENTION IF: The pain does not go away or becomes severe, particularly over the next 8-12 hours.  A temperature above 100.83F develops.  Repeated vomiting occurs (multiple episodes).   Blood is being passed in stools or vomit (bright red or black tarry stools).  Return also if you develop chest pain, difficulty breathing, dizziness or fainting, or become confused, poorly responsive, or inconsolable.

## 2015-05-29 NOTE — ED Notes (Signed)
Called lab to follow up on hepatic function and lipase and lab tech stated that it would result in 13 minutes.

## 2015-05-29 NOTE — ED Provider Notes (Signed)
History  By signing my name below, I, Lee [name], attest that this documentation has been prepared under the direction and in the presence of [name], MD. Electronically Signed: [name], ED Scribe. 05/29/2015. 3:24 AM.  Chief Complaint  Patient presents with  . Chest Pain  . Incisional Pain   The history is provided by the patient and medical records. No language interpreter was used.    HPI Comments:  Lee [name] is a 81 y.o. male brought in by EMS, who presents to the Emergency Department complaining of severe RUQ abdominal pain that began earlier this evening that has since resolved. Pt reports associated mid thoracic back pain and states he had difficulty catching his breath. Pt reports he had a cholecystostomy drain placed on 9/9 (approximately one month ago) and reports having a cardiac stress test done today and will not get the results until 3-4 days from now. Pt received ASA 324 mg via EMS but has not taken anything else for his symptoms. He denies any modifying factors of the pain. His wife has been keeping a log of drainage amount from drain and denies any decreases. He denies fever, chills, current back pain or SOB, nausea or vomiting. He reports he has a follow up appt about the cholecystostomy drain in four days.   Past Medical History  Diagnosis Date  . Interstitial lung disease ([place])     a. followed by pulm.  [name] HTN (hypertension)   . AAA (abdominal aortic aneurysm) ([place])     a. s/p repair 2002.  . Allergic rhinitis   . Erectile dysfunction   . Hyperlipidemia   . Obesity   . PVD (peripheral vascular disease) ([place])   . Basal cell carcinoma of face   . LBBB (left bundle branch block)     a. Intermittent - seen in 2013, not in 09/2013, but again in 04/2015.  [name] COPD (chronic obstructive pulmonary disease) ([place])   . Chronic respiratory failure ([place])     a. on home O2 after discharge 04/2015.  [name] Chronic systolic CHF (congestive heart failure) ([place])      a. Dx 04/2015 during complex admission for cholecystitis - EF 40-45% +WMA.  . Cholecystitis     a. complex admit 04/2015 due to sepsis due to acute Escherichia coli cholecystitis and right lower lobe CAP and Citrobacter UTI.  . Stroke [facility])     a. H/o aphasia - was told he'd had ministrokes. Imaging 09/2014 revealed prior infarct.   Past Surgical History  Procedure Laterality Date  . Aorta surgery  2002  . Hernia repair  1979  . Urinary tract stretch      late 46s   Family History  Problem Relation Age of Onset  . Hypertension Mother   . Colon cancer Brother   . Stroke Father   . Heart disease Father    Social History  Substance Use Topics  . Smoking status: Former Smoker -- 1.00 packs/day for 25 years    Types: Cigarettes    Quit date: 08/22/1974  . Smokeless tobacco: Never Used  . Alcohol Use: No    Review of Systems  Constitutional: Negative for fever and chills.  Respiratory: Positive for shortness of breath.   Cardiovascular: Positive for chest pain.  Gastrointestinal: Positive for abdominal pain. Negative for nausea and vomiting.  Musculoskeletal: Positive for back pain.  All other systems reviewed and are negative.   Allergies  Ciprofloxacin  Home Medications   Prior to Admission medications  Medication Sig Start Date End Date Taking? Authorizing Provider  albuterol (PROVENTIL HFA;VENTOLIN HFA) 108 (90 BASE) MCG/ACT inhaler Inhale 2 puffs into the lungs every 6 (six) hours as needed for wheezing or shortness of breath. 05/05/15  Yes Janece Canterbury, MD  carvedilol (COREG) 12.5 MG tablet Take 1 tablet (12.5 mg total) by mouth 2 (two) times daily with a meal. 05/05/15  Yes Janece Canterbury, MD  Cholecalciferol (VITAMIN D PO) Take 1 tablet by mouth daily.   Yes Historical Provider, MD  clopidogrel (PLAVIX) 75 MG tablet Take 75 mg by mouth daily.  09/24/14  Yes Historical Provider, MD  ezetimibe (ZETIA) 10 MG tablet Take 5 mg by mouth daily.    Yes Historical  Provider, MD  losartan (COZAAR) 50 MG tablet Take 50 mg by mouth daily.   Yes Historical Provider, MD  Multiple Vitamin (MULTIVITAMIN WITH MINERALS) TABS tablet Take 1 tablet by mouth daily.   Yes Historical Provider, MD  nitroGLYCERIN (NITROSTAT) 0.4 MG SL tablet Place 1 tablet (0.4 mg total) under the tongue every 5 (five) minutes as needed for chest pain. 05/05/15  Yes Janece Canterbury, MD  pantoprazole (PROTONIX) 40 MG tablet Take 40 mg by mouth daily. 09/25/14  Yes Historical Provider, MD  Pitavastatin Calcium (LIVALO) 2 MG TABS Take 2 mg by mouth See admin instructions. Every 3rd day   Yes Historical Provider, MD  tiotropium (SPIRIVA) 18 MCG inhalation capsule Place 1 capsule (18 mcg total) into inhaler and inhale daily. 05/05/15  Yes Janece Canterbury, MD  sodium chloride 0.9 % injection Inject 10 mLs into the vein as needed. Patient not taking: Reported on 05/29/2015 05/05/15   Janece Canterbury, MD   Triage Vitals: BP 129/47 mmHg  Pulse 72  Temp(Src) 97.5 F (36.4 C) (Oral)  Resp 24  Ht 5\' 6"  (1.676 m)  Wt 192 lb (87.091 kg)  BMI 31.00 kg/m2  SpO2 96%  Physical Exam  CONSTITUTIONAL: Well developed/well nourished HEAD: Normocephalic/atraumatic EYES: EOMI/PERRL ENMT: Mucous membranes moist NECK: supple no meningeal signs SPINE/BACK:entire spine nontender CV: S1/S2 noted, no murmurs/rubs/gallops noted LUNGS: decreased breath sounds bilaterally; no distress noted ABDOMEN: soft, nontender, no rebound or guarding, bowel sounds noted throughout abdomen. Drain noted to RUQ; no tenderness, no erythema noted to site GU:no cva tenderness NEURO: Pt is awake/alert/appropriate, moves all extremitiesx4.  No facial droop.   EXTREMITIES: pulses normal/equal, full ROM SKIN: warm, color normal PSYCH: no abnormalities of mood noted, alert and oriented to situation   ED Course  Procedures  DIAGNOSTIC STUDIES: Oxygen Saturation is 96% on RA, adequate by my interpretation.   COORDINATION OF  CARE: 2:18 AM- Will wait for labs and imaging to result. Pt verbalizes understanding and agrees to plan. Pt well appearing at this time He has cholecystostomy drain in place for cholecystitis Labs pending at this time Per EPIC records, pt just had stress test that per cardiology notes was unremarkable 5:15 AM Workup negative Pt well appearing He had no recurrent pain in the ED His vitals were appropriate Doubt PE and no significant hypoxia I doubt ACS given history/exam and recent negative stress test He has no signs of acute abdominal complications Pt and family would like to go home I feel this is reasonable given his stability during ED stay We discussed ER return precautions He has f/u already arranged BP 130/54 mmHg  Pulse 78  Temp(Src) 97.5 F (36.4 C) (Oral)  Resp 21  Ht 5\' 6"  (1.676 m)  Wt 192 lb (87.091 kg)  BMI  31.00 kg/m2  SpO2 95%   Labs Review Labs Reviewed  BASIC METABOLIC PANEL - Abnormal; Notable for the following:    Glucose, Bld 116 (*)    Calcium 8.7 (*)    All other components within normal limits  CBC - Abnormal; Notable for the following:    RBC 4.18 (*)    HCT 38.3 (*)    All other components within normal limits  HEPATIC FUNCTION PANEL - Abnormal; Notable for the following:    Total Protein 5.5 (*)    Albumin 2.8 (*)    AST 48 (*)    ALT 84 (*)    All other components within normal limits  PROTIME-INR  LIPASE, BLOOD  I-STAT TROPOININ, ED    Imaging Review Dg Chest 2 View  05/29/2015   CLINICAL DATA:  RIGHT chest pain beginning today. Recent history of cholecystostomy tube. History of COPD/interstitial lung disease, CHF.  EXAM: CHEST  2 VIEW  COMPARISON:  Chest radiograph May 04, 2015  FINDINGS: Low inspiratory examination. Cardiac silhouette is upper limits of normal in size, mediastinal silhouette is nonsuspicious, mildly calcified aortic knob. RIGHT upper lobe rounded density. Bibasilar and RIGHT midlung zone strandy densities. No  pneumothorax. Osteopenia. RIGHT upper quadrant pigtail drainage catheter compatible with cholecystostomy tube, retaining loop projecting within the abdomen. Osteopenia.  IMPRESSION: Borderline cardiomegaly. Bibasilar and RIGHT midlung zone atelectasis.  RIGHT upper lobe rounded density, this could represent consolidation or, anterior RIGHT first rib. Recommend close attention on follow-up imaging.   Electronically Signed   By: Elon Alas M.D.   On: 05/29/2015 02:34   I have personally reviewed and evaluated these images and lab results as part of my medical decision-making.   EKG Interpretation   Date/Time:  Friday May 29 2015 01:34:25 EDT Ventricular Rate:  69 PR Interval:  203 QRS Duration: 136 QT Interval:  434 QTC Calculation: 465 R Axis:   -37 Text Interpretation:  Sinus rhythm Left bundle branch block No significant  change since last tracing Confirmed by Christy Gentles  MD, Elenore Rota ([id number]) on  05/29/2015 1:49:12 AM      MDM   Final diagnoses:  Chest pain, unspecified chest pain type  Right upper quadrant pain    Nursing notes including past medical history and social history reviewed and considered in documentation Labs/vital reviewed myself and considered during evaluation xrays/imaging reviewed by myself and considered during evaluation Previous records reviewed and considered   I, Sharyon Cable, personally performed the services described in this documentation. All medical record entries made by the scribe were at my direction and in my presence.  I have reviewed the chart and discharge instructions and agree that the record reflects my personal performance and is accurate and complete. Sharyon Cable.  05/29/2015. 3:24 AM.       [name], MD 05/29/15 [phone]

## 2015-05-29 NOTE — ED Notes (Signed)
Pt verbalized understanding of d/c instructions and has no further questions. Pt stable and NAD discharged home with his family.

## 2015-06-02 ENCOUNTER — Other Ambulatory Visit: Payer: Self-pay | Admitting: General Surgery

## 2015-06-02 DIAGNOSIS — K819 Cholecystitis, unspecified: Secondary | ICD-10-CM

## 2015-06-04 ENCOUNTER — Telehealth: Payer: Self-pay | Admitting: *Deleted

## 2015-06-04 NOTE — Telephone Encounter (Signed)
Received surgical clearance request from [facility] Dr Marlou Starks for gallbladder surgery [facility] for surgery and hold Plavix 5 days prior, per  Dr. Mare Ferrari Given to medical records to be faxed

## 2015-06-08 ENCOUNTER — Other Ambulatory Visit: Payer: Self-pay | Admitting: Cardiology

## 2015-06-08 MED ORDER — CARVEDILOL 12.5 MG PO TABS: 12.5000 mg | ORAL_TABLET | Freq: Two times a day (BID) | ORAL | Status: DC

## 2015-06-08 NOTE — Telephone Encounter (Signed)
Called pt's wife Lee [name] to inform her that we have already sent the refill request to their Pharmacy [facility] on [address]. I informed the wife that if she has any other problems, questions or concerns to call our office. Wife verbalized understanding.

## 2015-06-16 ENCOUNTER — Ambulatory Visit (HOSPITAL_COMMUNITY)
Admission: RE | Admit: 2015-06-16 | Discharge: 2015-06-16 | Disposition: A | Discharge status: Home / Self Care | Payer: Medicare Other | Source: Ambulatory Visit | Attending: General Surgery | Admitting: General Surgery

## 2015-06-16 DIAGNOSIS — Z4682 Encounter for fitting and adjustment of non-vascular catheter: Secondary | ICD-10-CM | POA: Diagnosis present

## 2015-06-16 DIAGNOSIS — K8000 Calculus of gallbladder with acute cholecystitis without obstruction: Secondary | ICD-10-CM | POA: Diagnosis not present

## 2015-06-16 DIAGNOSIS — K819 Cholecystitis, unspecified: Secondary | ICD-10-CM

## 2015-06-16 MED ORDER — LIDOCAINE HCL 1 % IJ SOLN
INTRAMUSCULAR | Status: AC
  Filled 2015-06-16: qty 20

## 2015-06-16 MED ORDER — IOHEXOL 300 MG/ML  SOLN
50.0000 mL | Freq: Once | INTRAMUSCULAR | Status: DC | PRN
  Administered 2015-06-16: 10 mL via INTRAVENOUS
  Filled 2015-06-16: qty 50

## 2015-06-25 ENCOUNTER — Other Ambulatory Visit: Payer: Self-pay | Admitting: General Surgery

## 2015-07-06 ENCOUNTER — Telehealth: Payer: Self-pay | Admitting: Internal Medicine

## 2015-07-06 NOTE — Telephone Encounter (Signed)
Spoke with pt's wife. He has been scheduled with MR on 08/07/15 at 1:45pm. Nothing further was needed.

## 2015-08-03 ENCOUNTER — Other Ambulatory Visit: Payer: Self-pay | Admitting: Radiology

## 2015-08-03 DIAGNOSIS — K810 Acute cholecystitis: Secondary | ICD-10-CM

## 2015-08-04 ENCOUNTER — Ambulatory Visit (HOSPITAL_COMMUNITY)
Admission: RE | Admit: 2015-08-04 | Discharge: 2015-08-04 | Disposition: A | Discharge status: Home / Self Care | Payer: Medicare Other | Source: Ambulatory Visit | Attending: Radiology | Admitting: Radiology

## 2015-08-04 DIAGNOSIS — K8021 Calculus of gallbladder without cholecystitis with obstruction: Secondary | ICD-10-CM | POA: Diagnosis not present

## 2015-08-04 DIAGNOSIS — Z4803 Encounter for change or removal of drains: Secondary | ICD-10-CM | POA: Diagnosis not present

## 2015-08-04 DIAGNOSIS — K810 Acute cholecystitis: Secondary | ICD-10-CM

## 2015-08-04 MED ORDER — IOHEXOL 300 MG/ML  SOLN
50.0000 mL | Freq: Once | INTRAMUSCULAR | Status: AC | PRN
  Administered 2015-08-04: 10 mL

## 2015-08-04 MED ORDER — LIDOCAINE HCL 1 % IJ SOLN
INTRAMUSCULAR | Status: AC
  Filled 2015-08-04: qty 20

## 2015-08-04 NOTE — Procedures (Signed)
Interventional Radiology Procedure Note  Procedure: Image guided cholecystostomy drain exchange.    Findings:  Drain in position within the gallbladder.  New 3F drain into the gallbladder.  Cystic duct and CBD are patent on the current study.  Complications: None Recommendations:  - Ok to shower tomorrow - Do not submerge  - Routine care  - Observe surgical appointments - If not surgical candidate, may discuss capping trial and removal of drain, given that there is patency of the cystic duct and CBD.  - Return for drain exchange in 2-3 months if patient is not a candidate for cholecystectomy.  Signed,  Dulcy Fanny. Earleen Newport, DO

## 2015-08-07 ENCOUNTER — Ambulatory Visit (INDEPENDENT_AMBULATORY_CARE_PROVIDER_SITE_OTHER): Payer: Medicare Other | Admitting: Internal Medicine

## 2015-08-07 ENCOUNTER — Encounter: Payer: Self-pay | Admitting: Internal Medicine

## 2015-08-07 VITALS — BP 128/68 | HR 67 | Ht 66.0 in | Wt 188.4 lb

## 2015-08-07 DIAGNOSIS — Z01811 Encounter for preprocedural respiratory examination: Secondary | ICD-10-CM | POA: Diagnosis not present

## 2015-08-07 NOTE — Progress Notes (Signed)
Subjective:     Patient ID: Lee [name], male   DOB: 09/08/1930, 81 y.o.   MRN: [id number]  HPI  OV 08/07/2015  Chief Complaint  Patient presents with  . Follow-up    Pt needs surgical clearance for cholecystectomy. Pt has obtained cardiology clearance with moderate risk. Pt would like pulmonary clearance as well. Pt c/o occasional cough with yellow mucus. Pt denies wheeze/SOB/CP/tightness. Pt is on O2/6L qhs.   81 year old with emphysema/IPF of mild variety. On observation treatment. He is here with his wife and for the first time his daughter is with him. They here for preop pulmonary clearance. Around Labor Day 2016 he had Citrobacter sepsis associated with acute cholecystitis. Since then he has not interventional drain. Acute surgery could not be done apparently due to non-STEMI at that time. Since then he has been physically deconditioned. He underwent home physical therapy. He is slowly gaining his strength back. He is using a cane but does all his activities of daily living. Dr. Autumn Messing is now scheduled him for surgery after the new year 2017. He has been cleared by cardiology at moderate risk. He is here to assess his preoperative pulmonary risk prior to cholecystectomy. His wife and daughter and he stated that his physical conditioning is slowly improving.in addition they also report that he will have an extensive abdominal incision because of hiatal hernias. He will not have laparoscopic incision.    [place] Postperative Pulmonary Risk Score Comment Score  Type of surgery - abd ao aneurysm (27), thoracic (21), neurosurgery / upper abdominal / vascular (21), neck (11)  21 if upper abd  Emergency Surgery - (11) no 0  ALbumin < 3 or poor nutritional state - (9) Alb 2.8 - oct 2016 0  BUN > 30 -  (8) 9 0  Partial or completely dependent functional status - (7) Uses cane 7  COPD -  (6) Mild ipf/copd 6  Age - 8 to 19 (4), > 70  (6) Age 20 6  TOTAL  40  Risk Stratifcation scores  -  < 10, 11-19, 20-27, 28-40, >40  Mod-high risk     CANET Postperative Pulmonary Risk Score Comment Score  Age - <50 (0), 50-80 (3), >80 (16) Age  48 16  Preoperative pulse ox - >96 (0), 91-95 (8), <90 (24) 97% 0  Respiratory infection in last month - Yes (17) none 0  Preoperative anemia - < 10gm% - Yes (11) 13gm% 0  Surgical incision - Upper abdominal (15), Thoracic (24) Worst case upper abd 15 - long incision oer family  Duration of surgery - <2h (0), 2-3h (16), >3h (23) 2-3h esteimate 0  Emergency Surgery - Yes (8) no 0  TOTAL  31  Risk Stratification - Low (<26), Intermediate (26-44), High (>45)  Mod-hug risk if 3h surgery and upper abd incision       Current outpatient prescriptions:  .  albuterol (PROVENTIL HFA;VENTOLIN HFA) 108 (90 BASE) MCG/ACT inhaler, Inhale 2 puffs into the lungs every 6 (six) hours as needed for wheezing or shortness of breath., Disp: 1 Inhaler, Rfl: 0 .  carvedilol (COREG) 12.5 MG tablet, Take 1 tablet (12.5 mg total) by mouth 2 (two) times daily with a meal., Disp: 60 tablet, Rfl: 6 .  Cholecalciferol (VITAMIN D PO), Take 1 tablet by mouth daily., Disp: , Rfl:  .  clopidogrel (PLAVIX) 75 MG tablet, Take 75 mg by mouth daily. , Disp: , Rfl: 6 .  ezetimibe (ZETIA) 10 MG tablet,  Take 5 mg by mouth daily. , Disp: , Rfl:  .  losartan (COZAAR) 50 MG tablet, Take 50 mg by mouth daily., Disp: , Rfl:  .  Multiple Vitamin (MULTIVITAMIN WITH MINERALS) TABS tablet, Take 1 tablet by mouth daily., Disp: , Rfl:  .  nitroGLYCERIN (NITROSTAT) 0.4 MG SL tablet, Place 1 tablet (0.4 mg total) under the tongue every 5 (five) minutes as needed for chest pain., Disp: 30 tablet, Rfl: 0 .  pantoprazole (PROTONIX) 40 MG tablet, Take 40 mg by mouth daily., Disp: , Rfl: 11 .  Pitavastatin Calcium (LIVALO) 2 MG TABS, Take 2 mg by mouth See admin instructions. Every 3rd day, Disp: , Rfl:  .  tiotropium (SPIRIVA) 18 MCG inhalation capsule, Place 1 capsule (18 mcg total) into inhaler and  inhale daily., Disp: 30 capsule, Rfl: 0  Allergies  Allergen Reactions  . Ciprofloxacin Other (See Comments)    Tingle feeling throughout body    Immunization History  Administered Date(s) Administered  . Influenza Split 06/22/2012, 06/22/2013, 06/03/2015  . Influenza-Unspecified 06/22/2014  . Pneumococcal Polysaccharide-23 06/22/2010  . Tdap 09/22/2012       Review of Systems Current respirator status is stable. Otherwise history of present illness is reflective review of systems    Objective:   Physical Exam  Constitutional: He is oriented to person, place, and time. He appears well-developed and well-nourished. No distress.  Looks a bit more deconditioned but does ADLs  HENT:  Head: Normocephalic and atraumatic.  Right Ear: External ear normal.  Left Ear: External ear normal.  Mouth/Throat: Oropharynx is clear and moist. No oropharyngeal exudate.  Eyes: Conjunctivae and EOM are normal. Pupils are equal, round, and reactive to light. Right eye exhibits no discharge. Left eye exhibits no discharge. No scleral icterus.  Neck: Normal range of motion. Neck supple. No JVD present. No tracheal deviation present. No thyromegaly present.  Cardiovascular: Normal rate, regular rhythm and intact distal pulses.  Exam reveals no gallop and no friction rub.   No murmur heard. Pulmonary/Chest: Effort normal and breath sounds normal. No respiratory distress. He has no wheezes. He has no rales. He exhibits no tenderness.  Mild basal crackles  Abdominal: Soft. Bowel sounds are normal. He exhibits no distension and no mass. There is no tenderness. There is no rebound and no guarding.  Musculoskeletal: Normal range of motion. He exhibits no edema or tenderness.  Uses cane  Lymphadenopathy:    He has no cervical adenopathy.  Neurological: He is alert and oriented to person, place, and time. He has normal reflexes. No cranial nerve deficit. Coordination normal.  Skin: Skin is warm and dry. No  rash noted. He is not diaphoretic. No erythema. No pallor.  Psychiatric: He has a normal mood and affect. His behavior is normal. Judgment and thought content normal.  Nursing note and vitals reviewed.  Filed Vitals:   08/07/15 1356  BP: 128/68  Pulse: 67  Height: 5\' 6"  (1.676 m)  Weight: 188 lb 6.4 oz (85.458 kg)  SpO2: 97%       Assessment:       ICD-9-CM ICD-10-CM   1. Preoperative respiratory examination V72.82 Z01.811    Risk factors are - age, recent physical deconditioning and low albumin and extensive abdominal incision needed according to the history  Risk ameliorating factors are normal creat, normal hgb, and 2-3h surgery duration and elective surgery   Major Pulmonary risks identified in the multifactorial risk analysis are but not limited to a) pneumonia; b) recurrent  intubation risk; c) prolonged or recurrent acute respiratory failure needing mechanical ventilation; d) prolonged hospitalization; e) DVT/Pulmonary embolism; f) Acute Pulmonary edema  He is very keen to have the surgery. Explained to him, his wife and his daughter that risk is moderate-high. They understand 1 and he wants to proceed with cholecystectomy     Plan:      Recommend 1. Short duration of surgery as much as possible and avoid paralytic if possible 2. Recovery in step down or ICU with Pulmonary consultation 3. DVT prophylaxis 4. Aggressive pulmonary toilet with o2, bronchodilatation, and incentive spirometry and early ambulation 5. Improve nutritional status with help of protein supplement leading up until surgery  > 50% of this > 25 min visit spent in face to face counseling or coordination of care    Dr. Brand Males, M.D., [facility].C.P Pulmonary and Critical Care Medicine Staff Physician [place] [facility] Pager: [phone], If no answer or between  15:00h - 7:00h: call [phone]  08/07/2015 2:52 PM

## 2015-08-07 NOTE — Patient Instructions (Signed)
ICD-9-CM ICD-10-CM   1. Preoperative respiratory examination V72.82 Z01.811    moderate-high risk for pulmonary complications following gallbladder surgery Risk mainly comes from age, long incision for the surgery and recent illness Risk Can be reduced if your nutritional status improves - Recommend daily ensure or egg white  Or for whey protein  I will pass my notes onto Dr Marlou Starks   Followup - 3 months or sooner if needed

## 2015-08-15 ENCOUNTER — Telehealth: Payer: Self-pay | Admitting: Radiology

## 2015-08-15 NOTE — Telephone Encounter (Signed)
Received call today from [place] wife regarding redness/soreness around cholecystostomy drain site. She states that patient is not febrile nor does he have nausea or vomiting. Output of the drain is good at this time and is being flushed once daily. The bile is tea-colored. She states he's recently noticed some increasing redness and pain at drain site for the past few days. Diameter of the redness is approximately a  50 cent piece. Patient last had tube changed on 08/04/15 without any acute complications. A patent cystic duct was documented. Case was discussed with Dr. Annamaria Boots. At this time recommend initiation of Bactrim double strength 800/160, 1 tablet twice daily for 1 week. Prescription called in to patient's pharmacy. Plans discussed with patient's wife. She was advised to bring patient to ER if clinical status worsens.

## 2015-08-28 ENCOUNTER — Ambulatory Visit (INDEPENDENT_AMBULATORY_CARE_PROVIDER_SITE_OTHER): Payer: Medicare Other | Admitting: Cardiology

## 2015-08-28 ENCOUNTER — Encounter: Payer: Self-pay | Admitting: Cardiology

## 2015-08-28 VITALS — BP 130/74 | HR 86 | Ht 66.0 in | Wt 186.8 lb

## 2015-08-28 DIAGNOSIS — I5022 Chronic systolic (congestive) heart failure: Secondary | ICD-10-CM

## 2015-08-28 DIAGNOSIS — I447 Left bundle-branch block, unspecified: Secondary | ICD-10-CM

## 2015-08-28 NOTE — Progress Notes (Signed)
Cardiology Office Note   Date:  08/28/2015   ID:  Lee [name], DOB August 14, 1931, MRN [id number]  PCP:  Lee Ly, MD  Cardiologist: Lee Coco MD  Chief Complaint  Patient presents with  . Hypertension    denies cp sob le edema      History of Present Illness: Lee [name] is a 82 y.o. male who presents for scheduled follow-up office visit  Lee [name] is a 82 y.o. male with history of interstitial lung disease, abdominal aortic aneurysm status post repair 2002, hyperlipidemia, hypertension, left foot drop, COPD with chronic respiratory failure on home O2 QHS, TIAs/stroke, recently diagnosed systolic CHF and elevated troponin during recent hospitalization for sepsis due to cholecystitis.  He was admitted 9/8-9/13 with sepsis due to acute Escherichia coli cholecystitis and right lower lobe CAP and Citrobacter UTI.He had cholecystostomy drain on 9/9 by interventional radiology with plans for eventual lap chole once recovered if there is evidence of gallstone. During admission he also had acute hypoxic respiratory failure due to acute COPD exacerbation and acute systolic heart failure. He was started on Solu-Medrol, abx, breathing treatments and IV Lasix. Hospital course also complicated by AKI due to sepsis/dehydration resolved with IV fluids, leukocytosis, hyponatremia (resolved with diuresis), mild transaminitis, and generalized weakness due to severe illness. From a cardiac standpoint he had elevated troponin felt likely due to sepsis/tachycardia with flat troponins. 2D Echo showed mild LVH, EF 40-45%, akinesis of mid-apical anteroseptal myocardium, mild MR, PASP 69mmHg (prior EF 50-55% in 2013). He was also noted to have a new left bundle branch block on EKG compared to 09/2013, although office note by Dr. Mare [name] 07/2012 reports a LBBB of unknown origin at which time stress testing was negative. Metoprolol was changed to Coreg for improved BP control. Dr. Oval [name]  recommended outpatient followup with Lee [name] nuclear stress test once improved. Home Plavix was resumed (has been on this by PCP for history of TIA/stroke). Pertinent labs include Cr 1.03 at discharge, albumin 2.3, WBC 16 (peak 26), peak troponin 0.27.  The patient comes in today for follow-up.  He still has his percutaneous gallbladder drain.  He is anticipating cholecystectomy by Dr. Marlou [name] on January 20.  This will be an open procedure.  Apparently laparoscopic cholecystectomy is not an option because of many hernias in the operative area. The patient recently had an episode of herpes zoster.  He just finished a seven-day course of Valtrex.  The lesions are on the right side of his chest near his cholecystostomy drain. Since he was last seen in the office the patient had a myocardial perfusion imaging scan on 05/28/15 which did not show any evidence of ischemia.  His ejection fraction was 49% He has not been experiencing any recent chest pain or increasing shortness of breath.  He is on home oxygen at night while he sleeps.  Past Medical History  Diagnosis Date  . Interstitial lung disease ([place])     a. followed by pulm.  [name] HTN (hypertension)   . AAA (abdominal aortic aneurysm) ([place])     a. s/p repair 2002.  . Allergic rhinitis   . Erectile dysfunction   . Hyperlipidemia   . Obesity   . PVD (peripheral vascular disease) ([place])   . Basal cell carcinoma of face   . LBBB (left bundle branch block)     a. Intermittent - seen in 2013, not in 09/2013, but again in 04/2015.  [name] COPD (chronic obstructive pulmonary disease) ([place])   .  Chronic respiratory failure ([place])     a. on home O2 after discharge 04/2015.  [name] Chronic systolic CHF (congestive heart failure) ([place])     a. Dx 04/2015 during complex admission for cholecystitis - EF 40-45% +WMA.  . Cholecystitis     a. complex admit 04/2015 due to sepsis due to acute Escherichia coli cholecystitis and right lower lobe CAP and Citrobacter UTI.  . Stroke  [facility])     a. H/o aphasia - was told he'd had ministrokes. Imaging 09/2014 revealed prior infarct.    Past Surgical History  Procedure Laterality Date  . Aorta surgery  2002  . Hernia repair  1979  . Urinary tract stretch      late 70s     Current Outpatient Prescriptions  Medication Sig Dispense Refill  . albuterol (PROVENTIL HFA;VENTOLIN HFA) 108 (90 BASE) MCG/ACT inhaler Inhale 2 puffs into the lungs every 6 (six) hours as needed for wheezing or shortness of breath. 1 Inhaler 0  . carvedilol (COREG) 12.5 MG tablet Take 1 tablet (12.5 mg total) by mouth 2 (two) times daily with a meal. 60 tablet 6  . Cholecalciferol (VITAMIN D PO) Take 1 tablet by mouth daily.    . clopidogrel (PLAVIX) 75 MG tablet Take 75 mg by mouth daily.   6  . ezetimibe (ZETIA) 10 MG tablet Take 5 mg by mouth daily.     [name] losartan (COZAAR) 50 MG tablet Take 50 mg by mouth daily.    . Multiple Vitamin (MULTIVITAMIN WITH MINERALS) TABS tablet Take 1 tablet by mouth daily.    . nitroGLYCERIN (NITROSTAT) 0.4 MG SL tablet Place 1 tablet (0.4 mg total) under the tongue every 5 (five) minutes as needed for chest pain. 30 tablet 0  . pantoprazole (PROTONIX) 40 MG tablet Take 40 mg by mouth daily.  11  . Pitavastatin Calcium (LIVALO) 2 MG TABS Take 2 mg by mouth See admin instructions. Every 3rd day    . tiotropium (SPIRIVA) 18 MCG inhalation capsule Place 1 capsule (18 mcg total) into inhaler and inhale daily. 30 capsule 0   No current facility-administered medications for this visit.    Allergies:   Ciprofloxacin    Social History:  The patient  reports that he quit smoking about 41 years ago. His smoking use included Cigarettes. He has a 25 pack-year smoking history. He has never used smokeless tobacco. He reports that he does not drink alcohol or use illicit drugs.   Family History:  The patient's family history includes Colon cancer in his brother; Heart disease in his father; Hypertension in his mother; Stroke in  his father.    ROS:  Please see the history of present illness.   Otherwise, review of systems are positive for none.   All other systems are reviewed and negative.    PHYSICAL EXAM: VS:  BP 130/74 mmHg  Pulse 86  Ht 5\' 6"  (1.676 m)  Wt 186 lb 12.8 oz (84.732 kg)  BMI 30.16 kg/m2 , BMI Body mass index is 30.16 kg/(m^2). GEN: Well nourished, well developed, in no acute distress HEENT: normal Neck: no JVD, carotid bruits, or masses Cardiac: RRR; no murmurs, rubs, or gallops,no edema  Respiratory:  clear to auscultation bilaterally, normal work of breathing GI: soft, nontender, nondistended, + BS.  Cholecystostomy drain in right upper quadrant MS: no deformity or atrophy Skin: warm and dry, no rash.  Resolving herpes zoster shingle blisters on right lower lateral thorax Neuro:  Strength and sensation are  intact Psych: euthymic mood, full affect   EKG:  EKG is ordered today. The ekg ordered today demonstrates normal sinus rhythm with first-degree AV block and a heart rate of 87 bpm.  Left axis deviation with left bundle branch block.  Since 05/29/15, no significant change   Recent Labs: 05/01/2015: B Natriuretic Peptide 240.3*; TSH 0.878 05/04/2015: Magnesium 2.1 05/29/2015: ALT 84*; BUN 9; Creatinine, Ser 0.98; Hemoglobin 13.0; Platelets 207; Potassium 4.1; Sodium 136    Lipid Panel No results found for: CHOL, TRIG, HDL, CHOLHDL, VLDL, LDLCALC, LDLDIRECT    Wt Readings from Last 3 Encounters:  08/28/15 186 lb 12.8 oz (84.732 kg)  08/07/15 188 lb 6.4 oz (85.458 kg)  05/29/15 192 lb (87.091 kg)        ASSESSMENT AND PLAN:  1. Chronic systolic CHF (recent diagnosis) - appears euvolemic by exam. Continue BB/ARB.  Recent myocardial perfusion scan negative for ischemia.. 2. Elevated troponin - see above. He is on Plavix for h/o TIA.  Kidney Plavix.  Plavix can be held or 5-7 days prior to cholecystectomy. 3. LBBB - felt to be new during recent hospitalization but notes from 2013  indicate LBBB as well. This is likely intermittent as EKG in 09/2013 did not demonstrate such. 4. Chronic respiratory failure with COPD/pulmonary fibrosis - followed by pulmonology. O2 sat 99% on RA in clinic today. He uses oxygen QHS. His wife also keeps an eye on O2 at home as well. 5. Transaminitis in setting of cholecystitis - trending down by last check during recent hospitalization. 6.  Recent herpes zoster  Current medicines are reviewed at length with the patient today.  The patient does not have concerns regarding medicines.  The following changes have been made:  no change  Labs/ tests ordered today include:   Orders Placed This Encounter  Procedures  . EKG 12-Lead    Disposition: Continue current medication.  Okay to proceed with planned cholecystectomy on January 20.  Okay to stop Plavix 5-7 days prior to procedure.  Recheck in 6 months for office visit and EKG with Dr. Ellyn Hack.  Berna Spare MD 08/28/2015 5:34 PM    [facility], [address], [place]  [address] Phone: [phone]; Fax: [phone]

## 2015-08-28 NOTE — Patient Instructions (Signed)
Medication Instructions:  Your physician recommends that you continue on your current medications as directed. Please refer to the Current Medication list given to you today.  Labwork: none  Testing/Procedures: none  Follow-Up: Your physician wants you to follow-up in: 6 month ov with Dr Melody Haver will receive a reminder letter in the mail two months in advance. If you don't receive a letter, please call our office to schedule the follow-up appointment.  If you need a refill on your cardiac medications before your next appointment, please call your pharmacy.

## 2015-09-07 ENCOUNTER — Other Ambulatory Visit: Payer: Self-pay | Admitting: *Deleted

## 2015-09-07 ENCOUNTER — Other Ambulatory Visit: Payer: Self-pay | Admitting: Interventional Radiology

## 2015-09-07 ENCOUNTER — Other Ambulatory Visit: Payer: Self-pay | Admitting: General Surgery

## 2015-09-07 DIAGNOSIS — K8010 Calculus of gallbladder with chronic cholecystitis without obstruction: Secondary | ICD-10-CM

## 2015-09-07 LAB — BUN: BUN: 12 mg/dL (ref 7–25)

## 2015-09-07 LAB — CREATININE, SERUM: Creat: 1.02 mg/dL (ref 0.70–1.11)

## 2015-09-08 ENCOUNTER — Ambulatory Visit
Admission: RE | Admit: 2015-09-08 | Discharge: 2015-09-08 | Disposition: A | Discharge status: Home / Self Care | Payer: Medicare Other | Source: Ambulatory Visit | Attending: General Surgery | Admitting: General Surgery

## 2015-09-08 ENCOUNTER — Ambulatory Visit
Admission: RE | Admit: 2015-09-08 | Discharge: 2015-09-08 | Disposition: A | Discharge status: Home / Self Care | Payer: Medicare Other | Source: Ambulatory Visit | Attending: Interventional Radiology | Admitting: Interventional Radiology

## 2015-09-08 DIAGNOSIS — K8010 Calculus of gallbladder with chronic cholecystitis without obstruction: Secondary | ICD-10-CM

## 2015-09-08 MED ORDER — IOPAMIDOL (ISOVUE-300) INJECTION 61%
100.0000 mL | Freq: Once | INTRAVENOUS | Status: AC | PRN
  Administered 2015-09-08: 100 mL via INTRAVENOUS

## 2015-09-08 NOTE — Progress Notes (Signed)
Chief Complaint: Cholecystostomy tube evaluation    History of Present Illness: Lee [name] is a 82 y.o. male who has a history of acute cholecystitis, treated with percutaneous cholecystostomy tube 05/01/2015.  Dr. Marlou Starks of surgery was his surgeon on the consulting team on his admission in September 2016.  Currently the patient is returning for a drain injection, with the possibility of draindisplacement. He has had prior drain replacement and injection 06/16/2015 with a new 10 [place] drain placed at the time. This prior injection demonstrated patency of the cystic duct and common bile duct.  He denies any recent fevers writers or chills. No difficulty or complications with the drain. On the drain injection of today's date there is clear patency of the cystic duct and common bile duct.  His wife tells me that they have had surgical appointments, although the surgery has been delayed by an active shingles infection. She will be calling for a new appointment once the skin is healed from this shingles infection.  Past Medical History  Diagnosis Date  . Interstitial lung disease ([place])     a. followed by pulm.  [name] HTN (hypertension)   . AAA (abdominal aortic aneurysm) ([place])     a. s/p repair 2002.  . Allergic rhinitis   . Erectile dysfunction   . Hyperlipidemia   . Obesity   . PVD (peripheral vascular disease) ([place])   . Basal cell carcinoma of face   . LBBB (left bundle branch block)     a. Intermittent - seen in 2013, not in 09/2013, but again in 04/2015.  [name] COPD (chronic obstructive pulmonary disease) ([place])   . Chronic respiratory failure ([place])     a. on home O2 after discharge 04/2015.  [name] Chronic systolic CHF (congestive heart failure) ([place])     a. Dx 04/2015 during complex admission for cholecystitis - EF 40-45% +WMA.  . Cholecystitis     a. complex admit 04/2015 due to sepsis due to acute Escherichia coli cholecystitis and right lower lobe CAP and Citrobacter UTI.  . Stroke  [facility])     a. H/o aphasia - was told he'd had ministrokes. Imaging 09/2014 revealed prior infarct.    Past Surgical History  Procedure Laterality Date  . Aorta surgery  2002  . Hernia repair  1979  . Urinary tract stretch      late 70s    Allergies: Ciprofloxacin  Medications: Prior to Admission medications   Medication Sig Start Date End Date Taking? Authorizing Provider  albuterol (PROVENTIL HFA;VENTOLIN HFA) 108 (90 BASE) MCG/ACT inhaler Inhale 2 puffs into the lungs every 6 (six) hours as needed for wheezing or shortness of breath. 05/05/15   Janece Canterbury, MD  carvedilol (COREG) 12.5 MG tablet Take 1 tablet (12.5 mg total) by mouth 2 (two) times daily with a meal. 06/08/15   Darlin Coco, MD  Cholecalciferol (VITAMIN D PO) Take 1 tablet by mouth daily.    Historical Provider, MD  clopidogrel (PLAVIX) 75 MG tablet Take 75 mg by mouth daily.  09/24/14   Historical Provider, MD  ezetimibe (ZETIA) 10 MG tablet Take 5 mg by mouth daily.     Historical Provider, MD  losartan (COZAAR) 50 MG tablet Take 50 mg by mouth daily.    Historical Provider, MD  Multiple Vitamin (MULTIVITAMIN WITH MINERALS) TABS tablet Take 1 tablet by mouth daily.    Historical Provider, MD  nitroGLYCERIN (NITROSTAT) 0.4 MG SL tablet Place 1 tablet (0.4 mg total) under the  tongue every 5 (five) minutes as needed for chest pain. 05/05/15   Janece Canterbury, MD  pantoprazole (PROTONIX) 40 MG tablet Take 40 mg by mouth daily. 09/25/14   Historical Provider, MD  Pitavastatin Calcium (LIVALO) 2 MG TABS Take 2 mg by mouth See admin instructions. Every 3rd day    Historical Provider, MD  tiotropium (SPIRIVA) 18 MCG inhalation capsule Place 1 capsule (18 mcg total) into inhaler and inhale daily. 05/05/15   Janece Canterbury, MD     Family History  Problem Relation Age of Onset  . Hypertension Mother   . Colon cancer Brother   . Stroke Father   . Heart disease Father     Social History   Social History  . Marital  Status: Married    Spouse Name: N/A  . Number of Children: N/A  . Years of Education: N/A   Occupational History  . retired    Social History Main Topics  . Smoking status: Former Smoker -- 1.00 packs/day for 25 years    Types: Cigarettes    Quit date: 08/22/1974  . Smokeless tobacco: Never Used  . Alcohol Use: No  . Drug Use: No  . Sexual Activity: Not on file   Other Topics Concern  . Not on file   Social History Narrative      Review of Systems: A 12 point ROS discussed and pertinent positives are indicated in the HPI above.  All other systems are negative.  Review of Systems  Vital Signs: There were no vitals taken for this visit.  Physical Exam  Target exam at the drain site demonstrates that the skin is without evidence of infection. Expected reactive erythema at the site of injection with no drainage. The retention suture is no longer in place, although the drain is held in place by a stat lock. There are 2 small scabs at the right flank which his wife tells me are the residual from his prior shingles infection, with no active erythema or bulla.  Imaging: No results found.  Labs:  CBC:  Recent Labs  05/03/15 0304 05/04/15 0245 05/05/15 0210 05/29/15 0157  WBC 21.8* 16.8* 16.0* 7.2  HGB 13.6 14.0 13.5 13.0  HCT 38.5* 39.5 39.9 38.3*  PLT 260 282 276 207    COAGS:  Recent Labs  05/01/15 0020 05/29/15 0157  INR 1.31 1.15  APTT 36  --     BMP:  Recent Labs  05/03/15 0304 05/04/15 0245 05/05/15 0210 05/29/15 0157 09/07/15 1701  NA 140 141 141 136  --   K 3.2* 3.3* 3.5 4.1  --   CL 107 107 108 104  --   CO2 23 26 26 24   --   GLUCOSE 148* 154* 134* 116*  --   BUN 19 24* 26* 9 12  CALCIUM 8.8* 8.5* 8.4* 8.7*  --   CREATININE 0.87 1.10 1.03 0.98 1.02  GFRNONAA >60 >60 >60 >60  --   GFRAA >60 >60 >60 >60  --     LIVER FUNCTION TESTS:  Recent Labs  05/03/15 0304 05/04/15 0245 05/05/15 0210 05/29/15 0157  BILITOT 1.2 0.6 0.8 0.7   AST 99* 107* 36 48*  ALT 85* 133* 87* 84*  ALKPHOS 65 65 52 73  PROT 5.8* 5.6* 5.2* 5.5*  ALBUMIN 2.3* 2.3* 2.3* 2.8*    TUMOR MARKERS: No results for input(s): AFPTM, CEA, CA199, CHROMGRNA in the last 8760 hours.  Assessment and Plan:  82 year old gentleman with a history of acute  cholecystitis, status post cholecystostomy tube placement 05/01/2015.  Today's drain injection demonstrates patency of the cystic duct and common bile duct. If indicated, it would be reasonable to attempt a capping trial, and if he were not a surgical candidate, discussion about drain removal is reasonable.  At this point I am told that she should have an upcoming surgery for cholecystectomy, which may occur after he has resolved his shingles infection. His wife will be calling in the near future, as this infection has nearly resolved.  We discussed that she should continue to care for the drain as she has been with daily drain flushes. She understands and agrees with the plan of care.  At this time we can see him on elective basis for drain exchange, which could happen in the next 2-3 months if needed.  Thank you for this interesting consult.  I greatly enjoyed meeting Mazin H Wanner and look forward to participating in their care.  A copy of this report was sent to the requesting provider on this date.  Electronically Signed: Corrie Mckusick 09/08/2015, 1:16 PM

## 2015-09-17 ENCOUNTER — Encounter (HOSPITAL_COMMUNITY): Admission: RE | Payer: Self-pay | Source: Ambulatory Visit

## 2015-09-17 ENCOUNTER — Ambulatory Visit (HOSPITAL_COMMUNITY): Admission: RE | Admit: 2015-09-17 | Payer: Medicare Other | Source: Ambulatory Visit | Admitting: General Surgery

## 2015-09-17 SURGERY — LAPAROSCOPIC CHOLECYSTECTOMY WITH INTRAOPERATIVE CHOLANGIOGRAM
Anesthesia: General

## 2015-09-28 ENCOUNTER — Encounter (HOSPITAL_COMMUNITY)
Admission: RE | Admit: 2015-09-28 | Discharge: 2015-09-28 | Disposition: A | Discharge status: Home / Self Care | Payer: Medicare Other | Source: Ambulatory Visit | Attending: General Surgery | Admitting: General Surgery

## 2015-09-28 ENCOUNTER — Encounter (HOSPITAL_COMMUNITY): Payer: Self-pay

## 2015-09-28 DIAGNOSIS — J849 Interstitial pulmonary disease, unspecified: Secondary | ICD-10-CM | POA: Diagnosis not present

## 2015-09-28 DIAGNOSIS — I739 Peripheral vascular disease, unspecified: Secondary | ICD-10-CM | POA: Diagnosis not present

## 2015-09-28 DIAGNOSIS — J449 Chronic obstructive pulmonary disease, unspecified: Secondary | ICD-10-CM | POA: Diagnosis not present

## 2015-09-28 DIAGNOSIS — Z8673 Personal history of transient ischemic attack (TIA), and cerebral infarction without residual deficits: Secondary | ICD-10-CM | POA: Diagnosis not present

## 2015-09-28 DIAGNOSIS — Z4682 Encounter for fitting and adjustment of non-vascular catheter: Secondary | ICD-10-CM | POA: Diagnosis not present

## 2015-09-28 DIAGNOSIS — I440 Atrioventricular block, first degree: Secondary | ICD-10-CM | POA: Diagnosis not present

## 2015-09-28 DIAGNOSIS — J84112 Idiopathic pulmonary fibrosis: Secondary | ICD-10-CM | POA: Diagnosis not present

## 2015-09-28 DIAGNOSIS — K819 Cholecystitis, unspecified: Secondary | ICD-10-CM | POA: Diagnosis not present

## 2015-09-28 DIAGNOSIS — I5022 Chronic systolic (congestive) heart failure: Secondary | ICD-10-CM | POA: Diagnosis not present

## 2015-09-28 DIAGNOSIS — I110 Hypertensive heart disease with heart failure: Secondary | ICD-10-CM | POA: Diagnosis not present

## 2015-09-28 DIAGNOSIS — J9610 Chronic respiratory failure, unspecified whether with hypoxia or hypercapnia: Secondary | ICD-10-CM | POA: Diagnosis not present

## 2015-09-28 DIAGNOSIS — Z87891 Personal history of nicotine dependence: Secondary | ICD-10-CM | POA: Diagnosis not present

## 2015-09-28 DIAGNOSIS — Z9981 Dependence on supplemental oxygen: Secondary | ICD-10-CM | POA: Diagnosis not present

## 2015-09-28 DIAGNOSIS — E785 Hyperlipidemia, unspecified: Secondary | ICD-10-CM | POA: Diagnosis not present

## 2015-09-28 DIAGNOSIS — I447 Left bundle-branch block, unspecified: Secondary | ICD-10-CM | POA: Diagnosis not present

## 2015-09-28 HISTORY — DX: Anxiety disorder, unspecified: F41.9

## 2015-09-28 HISTORY — DX: Zoster without complications: B02.9

## 2015-09-28 HISTORY — DX: Personal history of other diseases of the digestive system: Z87.19

## 2015-09-28 LAB — BASIC METABOLIC PANEL
Anion gap: 11 (ref 5–15)
BUN: 11 mg/dL (ref 6–20)
CALCIUM: 9.4 mg/dL (ref 8.9–10.3)
CO2: 25 mmol/L (ref 22–32)
CREATININE: 0.98 mg/dL (ref 0.61–1.24)
Chloride: 107 mmol/L (ref 101–111)
GFR calc non Af Amer: >60 mL/min (ref >60)
Glucose, Bld: 112 mg/dL — ABNORMAL HIGH (ref 65–99)
Potassium: 4.3 mmol/L (ref 3.5–5.1)
SODIUM: 143 mmol/L (ref 135–145)

## 2015-09-28 LAB — CBC
HCT: 44 % (ref 39.0–52.0)
Hemoglobin: 14.8 g/dL (ref 13.0–17.0)
MCH: 31.4 pg (ref 26.0–34.0)
MCHC: 33.6 g/dL (ref 30.0–36.0)
MCV: 93.4 fL (ref 78.0–100.0)
Platelets: 258 10*3/uL (ref 150–400)
RBC: 4.71 MIL/uL (ref 4.22–5.81)
RDW: 13.8 % (ref 11.5–15.5)
WBC: 9.7 10*3/uL (ref 4.0–10.5)

## 2015-09-28 NOTE — Pre-Procedure Instructions (Signed)
    Lee [name]  09/28/2015      [id number] [name], [name] [address] Phone: [phone] Fax: [phone]    Your procedure is scheduled on 10/05/15.  Report to [facility] Admitting at [place] A.M.  Call this number if you have problems the morning of surgery:  [phone]   Remember:  Do not eat food or drink liquids after midnight.  Take these medicines the morning of surgery with A SIP OF WATER --all inhalers,carvedilol,zetia,protonix,   Do not wear jewelry, make-up or nail polish.  Do not wear lotions, powders, or perfumes.  You may wear deodorant.  Do not shave 48 hours prior to surgery.  Men may shave face and neck.  Do not bring valuables to the hospital.  [name] is not responsible for any belongings or valuables.  Contacts, dentures or bridgework may not be worn into surgery.  Leave your suitcase in the car.  After surgery it may be brought to your room.  For patients admitted to the hospital, discharge time will be determined by your treatment team.  Patients discharged the day of surgery will not be allowed to drive home.   Name and phone number of your driver:    Special instructions:    Please read over the following fact sheets that you were given. Pain Booklet, Coughing and Deep Breathing and Surgical Site Infection Prevention

## 2015-09-29 ENCOUNTER — Other Ambulatory Visit (HOSPITAL_COMMUNITY): Payer: Self-pay | Admitting: General Surgery

## 2015-09-29 DIAGNOSIS — K819 Cholecystitis, unspecified: Secondary | ICD-10-CM

## 2015-09-29 NOTE — Progress Notes (Signed)
Anesthesia Chart Review:  Pt is an 82 year old male scheduled for laparoscopic cholecystectomy, probably open on 10/05/2015 with Dr. Marlou Starks.   Cardiologist is Dr. Darlin Coco, last office visit 08/28/15, who has cleared pt for surgery.   Pulmonologist is Dr. Brand Males, last office visit 08/07/15, who cleared pt for surgery at moderate risk and notes "1. Short duration of surgery as much as possible and avoid paralytic if possible; 2. Recovery in step down or ICU with Pulmonary consultation; 3. DVT prophylaxis; 4. Aggressive pulmonary toilet with o2, bronchodilatation, and incentive spirometry and early ambulation"  PMH includes:  CHF, LBBB, HTN, AAA (s/p repair 2002), stroke, chronic respiratory failure, COPD, interstitial lung disease, PVD, hyperlipidemia. Uses home 2L oxygen at night. Former smoker. BMI 30.   Pt hospitalized 9/8-9/13/16 for sepsis due to acute E coli cholecystitis, complicated by idiopathic pulmonary fibrosis, acute respiratory failure with hypoxia, acute renal failure, CAP, LV dysfunction, acute CHF.   Medications include: albuterol, carvedilol, plavix, zetia, losartan, protonix, pitavastatin, spiriva. Pt to stop plavix 5-7 days before surgery.   Preoperative labs reviewed.    Chest x-ray 05/29/15 reviewed.  - Borderline cardiomegaly. Bibasilar and RIGHT midlung zone atelectasis. - RIGHT upper lobe rounded density, this could represent consolidation or, anterior RIGHT first rib. Recommend close attention on follow-up Imaging.  EKG 08/28/15: sinus rhythm with 1st degree AV block. LAD. LBBB.   Nuclear stress test 05/28/15: - Small inferoseptal defect likely artefact secondary to LBBB and to adjacent noncardiac activity. No ischemia. - Mild LV systolic dysfunction with mild inferoseptal hypokinesis likely related to paradoxical septal motion from LBBB. EF 49%  Echo 04/28/15:  - Left ventricle: The cavity size was normal. Wall thickness was increased in a pattern of  mild LVH. Systolic function was mildly to moderately reduced. The estimated ejection fraction was in the range of 40% to 45%. Akinesis of the mid-apicalanteroseptal myocardium. - Mitral valve: There was mild regurgitation. - Pulmonary arteries: Systolic pressure was moderately increased. PA peak pressure: 48 mm Hg (S).  If no changes, I anticipate pt can proceed with surgery as scheduled.   Willeen Cass, FNP-BC [facility]/Anesthesiology Phone: [phone] 09/29/2015 2:52 PM

## 2015-09-30 ENCOUNTER — Ambulatory Visit (HOSPITAL_COMMUNITY)
Admission: RE | Admit: 2015-09-30 | Discharge: 2015-09-30 | Disposition: A | Discharge status: Home / Self Care | Payer: Medicare Other | Source: Ambulatory Visit | Attending: General Surgery | Admitting: General Surgery

## 2015-09-30 ENCOUNTER — Other Ambulatory Visit (HOSPITAL_COMMUNITY): Payer: Self-pay | Admitting: General Surgery

## 2015-09-30 DIAGNOSIS — Z4682 Encounter for fitting and adjustment of non-vascular catheter: Secondary | ICD-10-CM | POA: Insufficient documentation

## 2015-09-30 DIAGNOSIS — Z9981 Dependence on supplemental oxygen: Secondary | ICD-10-CM | POA: Insufficient documentation

## 2015-09-30 DIAGNOSIS — K819 Cholecystitis, unspecified: Secondary | ICD-10-CM | POA: Insufficient documentation

## 2015-09-30 DIAGNOSIS — J9610 Chronic respiratory failure, unspecified whether with hypoxia or hypercapnia: Secondary | ICD-10-CM | POA: Insufficient documentation

## 2015-09-30 DIAGNOSIS — I447 Left bundle-branch block, unspecified: Secondary | ICD-10-CM | POA: Insufficient documentation

## 2015-09-30 DIAGNOSIS — I739 Peripheral vascular disease, unspecified: Secondary | ICD-10-CM | POA: Insufficient documentation

## 2015-09-30 DIAGNOSIS — I440 Atrioventricular block, first degree: Secondary | ICD-10-CM | POA: Insufficient documentation

## 2015-09-30 DIAGNOSIS — J449 Chronic obstructive pulmonary disease, unspecified: Secondary | ICD-10-CM | POA: Insufficient documentation

## 2015-09-30 DIAGNOSIS — Z8673 Personal history of transient ischemic attack (TIA), and cerebral infarction without residual deficits: Secondary | ICD-10-CM | POA: Insufficient documentation

## 2015-09-30 DIAGNOSIS — Z87891 Personal history of nicotine dependence: Secondary | ICD-10-CM | POA: Insufficient documentation

## 2015-09-30 DIAGNOSIS — J849 Interstitial pulmonary disease, unspecified: Secondary | ICD-10-CM | POA: Insufficient documentation

## 2015-09-30 DIAGNOSIS — E785 Hyperlipidemia, unspecified: Secondary | ICD-10-CM | POA: Insufficient documentation

## 2015-09-30 DIAGNOSIS — I110 Hypertensive heart disease with heart failure: Secondary | ICD-10-CM | POA: Insufficient documentation

## 2015-09-30 DIAGNOSIS — J84112 Idiopathic pulmonary fibrosis: Secondary | ICD-10-CM | POA: Insufficient documentation

## 2015-09-30 DIAGNOSIS — I5022 Chronic systolic (congestive) heart failure: Secondary | ICD-10-CM | POA: Insufficient documentation

## 2015-09-30 MED ORDER — IOHEXOL 300 MG/ML  SOLN: 50.0000 mL | Freq: Once | INTRAMUSCULAR | Status: DC | PRN

## 2015-09-30 MED ORDER — LIDOCAINE HCL 1 % IJ SOLN
INTRAMUSCULAR | Status: DC
Start: 2015-09-30 — End: 2015-10-01
  Filled 2015-09-30: qty 20

## 2015-10-01 ENCOUNTER — Other Ambulatory Visit: Payer: Self-pay | Admitting: General Surgery

## 2015-10-01 ENCOUNTER — Other Ambulatory Visit (HOSPITAL_COMMUNITY): Payer: Self-pay | Admitting: General Surgery

## 2015-10-01 DIAGNOSIS — K819 Cholecystitis, unspecified: Secondary | ICD-10-CM

## 2015-10-02 MED ORDER — CHLORHEXIDINE GLUCONATE 4 % EX LIQD: 1.0000 "application " | Freq: Once | CUTANEOUS | Status: DC

## 2015-10-02 MED ORDER — CEFAZOLIN SODIUM-DEXTROSE 2-3 GM-% IV SOLR
2.0000 g | INTRAVENOUS | Status: AC
  Administered 2015-10-05: 2 g via INTRAVENOUS
  Filled 2015-10-02: qty 50

## 2015-10-05 ENCOUNTER — Ambulatory Visit (HOSPITAL_COMMUNITY): Payer: Medicare Other | Admitting: Emergency Medicine

## 2015-10-05 ENCOUNTER — Ambulatory Visit (HOSPITAL_COMMUNITY)
Admission: RE | Admit: 2015-10-05 | Discharge: 2015-10-06 | Disposition: A | Discharge status: Home / Self Care | Payer: Medicare Other | Source: Ambulatory Visit | Attending: General Surgery | Admitting: General Surgery

## 2015-10-05 ENCOUNTER — Ambulatory Visit (HOSPITAL_COMMUNITY): Payer: Medicare Other | Admitting: Certified Registered Nurse Anesthetist

## 2015-10-05 ENCOUNTER — Encounter (HOSPITAL_COMMUNITY)
Admission: RE | Disposition: A | Discharge status: Home / Self Care | Payer: Self-pay | Source: Ambulatory Visit | Attending: General Surgery

## 2015-10-05 ENCOUNTER — Encounter (HOSPITAL_COMMUNITY): Payer: Self-pay | Admitting: General Practice

## 2015-10-05 ENCOUNTER — Ambulatory Visit (HOSPITAL_COMMUNITY): Payer: Medicare Other

## 2015-10-05 DIAGNOSIS — Z87891 Personal history of nicotine dependence: Secondary | ICD-10-CM | POA: Diagnosis not present

## 2015-10-05 DIAGNOSIS — K819 Cholecystitis, unspecified: Secondary | ICD-10-CM | POA: Diagnosis present

## 2015-10-05 DIAGNOSIS — Z7902 Long term (current) use of antithrombotics/antiplatelets: Secondary | ICD-10-CM | POA: Diagnosis not present

## 2015-10-05 DIAGNOSIS — K8010 Calculus of gallbladder with chronic cholecystitis without obstruction: Secondary | ICD-10-CM | POA: Diagnosis not present

## 2015-10-05 DIAGNOSIS — I252 Old myocardial infarction: Secondary | ICD-10-CM | POA: Diagnosis not present

## 2015-10-05 DIAGNOSIS — E7800 Pure hypercholesterolemia, unspecified: Secondary | ICD-10-CM | POA: Insufficient documentation

## 2015-10-05 DIAGNOSIS — J449 Chronic obstructive pulmonary disease, unspecified: Secondary | ICD-10-CM | POA: Diagnosis not present

## 2015-10-05 DIAGNOSIS — I10 Essential (primary) hypertension: Secondary | ICD-10-CM | POA: Diagnosis not present

## 2015-10-05 DIAGNOSIS — Z419 Encounter for procedure for purposes other than remedying health state, unspecified: Secondary | ICD-10-CM

## 2015-10-05 DIAGNOSIS — Z8673 Personal history of transient ischemic attack (TIA), and cerebral infarction without residual deficits: Secondary | ICD-10-CM | POA: Insufficient documentation

## 2015-10-05 DIAGNOSIS — K219 Gastro-esophageal reflux disease without esophagitis: Secondary | ICD-10-CM | POA: Insufficient documentation

## 2015-10-05 DIAGNOSIS — Z9981 Dependence on supplemental oxygen: Secondary | ICD-10-CM | POA: Diagnosis not present

## 2015-10-05 DIAGNOSIS — Z79899 Other long term (current) drug therapy: Secondary | ICD-10-CM | POA: Diagnosis not present

## 2015-10-05 HISTORY — PX: CHOLECYSTECTOMY: SHX55

## 2015-10-05 HISTORY — DX: Dependence on supplemental oxygen: Z99.81

## 2015-10-05 HISTORY — PX: INTRAOPERATIVE CHOLANGIOGRAM: SHX5230

## 2015-10-05 HISTORY — PX: LAPAROSCOPIC CHOLECYSTECTOMY: SUR755

## 2015-10-05 LAB — SURGICAL PCR SCREEN
MRSA, PCR: POSITIVE — AB
Staphylococcus aureus: POSITIVE — AB

## 2015-10-05 SURGERY — LAPAROSCOPIC CHOLECYSTECTOMY
Anesthesia: General | Site: Abdomen

## 2015-10-05 MED ORDER — ONDANSETRON 4 MG PO TBDP: 4.0000 mg | ORAL_TABLET | Freq: Four times a day (QID) | ORAL | Status: DC | PRN

## 2015-10-05 MED ORDER — LIDOCAINE HCL (CARDIAC) 20 MG/ML IV SOLN
INTRAVENOUS | Status: DC | PRN
  Administered 2015-10-05: 30 mg via INTRAVENOUS

## 2015-10-05 MED ORDER — PRAVASTATIN SODIUM 10 MG PO TABS: 20.0000 mg | ORAL_TABLET | Freq: Every day | ORAL | Status: DC

## 2015-10-05 MED ORDER — OXYCODONE HCL 5 MG/5ML PO SOLN: 5.0000 mg | Freq: Once | ORAL | Status: DC | PRN

## 2015-10-05 MED ORDER — MUPIROCIN 2 % EX OINT
1.0000 "application " | TOPICAL_OINTMENT | Freq: Once | CUTANEOUS | Status: AC
  Administered 2015-10-05: 1 via TOPICAL

## 2015-10-05 MED ORDER — ONDANSETRON HCL 4 MG/2ML IJ SOLN
INTRAMUSCULAR | Status: DC | PRN
  Administered 2015-10-05: 4 mg via INTRAVENOUS

## 2015-10-05 MED ORDER — CLOPIDOGREL BISULFATE 75 MG PO TABS
75.0000 mg | ORAL_TABLET | Freq: Every day | ORAL | Status: DC
  Filled 2015-10-05: qty 1

## 2015-10-05 MED ORDER — NITROGLYCERIN 0.4 MG SL SUBL: 0.4000 mg | SUBLINGUAL_TABLET | SUBLINGUAL | Status: DC | PRN

## 2015-10-05 MED ORDER — TIOTROPIUM BROMIDE MONOHYDRATE 18 MCG IN CAPS
18.0000 ug | ORAL_CAPSULE | Freq: Every day | RESPIRATORY_TRACT | Status: DC
Start: 2015-10-06 — End: 2015-10-06
  Filled 2015-10-05 (×2): qty 5

## 2015-10-05 MED ORDER — SUGAMMADEX SODIUM 200 MG/2ML IV SOLN
INTRAVENOUS | Status: DC | PRN
  Administered 2015-10-05: 200 mg via INTRAVENOUS

## 2015-10-05 MED ORDER — ALBUTEROL SULFATE (2.5 MG/3ML) 0.083% IN NEBU: 3.0000 mL | INHALATION_SOLUTION | Freq: Four times a day (QID) | RESPIRATORY_TRACT | Status: DC | PRN

## 2015-10-05 MED ORDER — KCL IN DEXTROSE-NACL 20-5-0.9 MEQ/L-%-% IV SOLN
INTRAVENOUS | Status: DC
  Administered 2015-10-05: 22:00:00 via INTRAVENOUS
  Filled 2015-10-05 (×2): qty 1000

## 2015-10-05 MED ORDER — ONDANSETRON HCL 4 MG/2ML IJ SOLN: 4.0000 mg | Freq: Four times a day (QID) | INTRAMUSCULAR | Status: DC | PRN

## 2015-10-05 MED ORDER — PANTOPRAZOLE SODIUM 40 MG PO TBEC
40.0000 mg | DELAYED_RELEASE_TABLET | Freq: Every day | ORAL | Status: DC
  Administered 2015-10-06: 40 mg via ORAL
  Filled 2015-10-05: qty 1

## 2015-10-05 MED ORDER — CHLORHEXIDINE GLUCONATE CLOTH 2 % EX PADS
6.0000 | MEDICATED_PAD | Freq: Every day | CUTANEOUS | Status: DC
  Administered 2015-10-06: 6 via TOPICAL

## 2015-10-05 MED ORDER — ONDANSETRON HCL 4 MG/2ML IJ SOLN: 4.0000 mg | Freq: Once | INTRAMUSCULAR | Status: DC | PRN

## 2015-10-05 MED ORDER — IOHEXOL 300 MG/ML  SOLN
INTRAMUSCULAR | Status: DC | PRN
  Administered 2015-10-05: 9 mL via INTRAVENOUS

## 2015-10-05 MED ORDER — LACTATED RINGERS IV SOLN
INTRAVENOUS | Status: DC
  Administered 2015-10-05: 12:00:00 via INTRAVENOUS

## 2015-10-05 MED ORDER — BUPIVACAINE-EPINEPHRINE (PF) 0.25% -1:200000 IJ SOLN
INTRAMUSCULAR | Status: AC
  Filled 2015-10-05: qty 30

## 2015-10-05 MED ORDER — PHENYLEPHRINE HCL 10 MG/ML IJ SOLN
10.0000 mg | INTRAVENOUS | Status: DC | PRN
  Administered 2015-10-05: 10 ug/min via INTRAVENOUS

## 2015-10-05 MED ORDER — PROPOFOL 10 MG/ML IV BOLUS
INTRAVENOUS | Status: DC | PRN
  Administered 2015-10-05: 75 mg via INTRAVENOUS

## 2015-10-05 MED ORDER — SODIUM CHLORIDE 0.9 % IR SOLN
Status: DC | PRN
  Administered 2015-10-05: 1000 mL

## 2015-10-05 MED ORDER — OXYCODONE HCL 5 MG PO TABS: 5.0000 mg | ORAL_TABLET | Freq: Once | ORAL | Status: DC | PRN

## 2015-10-05 MED ORDER — OXYCODONE-ACETAMINOPHEN 5-325 MG PO TABS
1.0000 | ORAL_TABLET | ORAL | Status: DC | PRN
  Administered 2015-10-06: 1 via ORAL
  Filled 2015-10-05: qty 1

## 2015-10-05 MED ORDER — BUPIVACAINE-EPINEPHRINE 0.25% -1:200000 IJ SOLN
INTRAMUSCULAR | Status: DC | PRN
  Administered 2015-10-05: 15 mL

## 2015-10-05 MED ORDER — 0.9 % SODIUM CHLORIDE (POUR BTL) OPTIME
TOPICAL | Status: DC | PRN
  Administered 2015-10-05: 1000 mL

## 2015-10-05 MED ORDER — EZETIMIBE 10 MG PO TABS
10.0000 mg | ORAL_TABLET | Freq: Every day | ORAL | Status: DC
  Filled 2015-10-05: qty 1

## 2015-10-05 MED ORDER — LOSARTAN POTASSIUM 50 MG PO TABS
50.0000 mg | ORAL_TABLET | Freq: Every day | ORAL | Status: DC
  Filled 2015-10-05: qty 1

## 2015-10-05 MED ORDER — MORPHINE SULFATE (PF) 2 MG/ML IV SOLN: 1.0000 mg | INTRAVENOUS | Status: DC | PRN

## 2015-10-05 MED ORDER — CARVEDILOL 12.5 MG PO TABS
12.5000 mg | ORAL_TABLET | Freq: Two times a day (BID) | ORAL | Status: DC
  Administered 2015-10-06: 12.5 mg via ORAL
  Filled 2015-10-05: qty 1

## 2015-10-05 MED ORDER — ROCURONIUM BROMIDE 100 MG/10ML IV SOLN
INTRAVENOUS | Status: DC | PRN
  Administered 2015-10-05: 20 mg via INTRAVENOUS
  Administered 2015-10-05: 50 mg via INTRAVENOUS

## 2015-10-05 MED ORDER — MUPIROCIN 2 % EX OINT
1.0000 "application " | TOPICAL_OINTMENT | Freq: Two times a day (BID) | CUTANEOUS | Status: DC
  Administered 2015-10-05: 1 via NASAL

## 2015-10-05 MED ORDER — HEPARIN SODIUM (PORCINE) 5000 UNIT/ML IJ SOLN
5000.0000 [IU] | Freq: Three times a day (TID) | INTRAMUSCULAR | Status: DC
  Administered 2015-10-06: 5000 [IU] via SUBCUTANEOUS
  Filled 2015-10-05: qty 1

## 2015-10-05 MED ORDER — EPHEDRINE SULFATE 50 MG/ML IJ SOLN
INTRAMUSCULAR | Status: DC | PRN
  Administered 2015-10-05: 20 mg via INTRAVENOUS

## 2015-10-05 MED ORDER — MUPIROCIN 2 % EX OINT
TOPICAL_OINTMENT | CUTANEOUS | Status: AC
Start: 2015-10-05 — End: 2015-10-06
  Filled 2015-10-05: qty 22

## 2015-10-05 MED ORDER — FENTANYL CITRATE (PF) 100 MCG/2ML IJ SOLN: 25.0000 ug | INTRAMUSCULAR | Status: DC | PRN

## 2015-10-05 MED ORDER — FENTANYL CITRATE (PF) 100 MCG/2ML IJ SOLN
INTRAMUSCULAR | Status: DC | PRN
  Administered 2015-10-05: 50 ug via INTRAVENOUS
  Administered 2015-10-05 (×2): 25 ug via INTRAVENOUS
  Administered 2015-10-05: 75 ug via INTRAVENOUS
  Administered 2015-10-05: 25 ug via INTRAVENOUS
  Administered 2015-10-05: 50 ug via INTRAVENOUS

## 2015-10-05 SURGICAL SUPPLY — 43 items
APPLIER CLIP 5 13 M/L LIGAMAX5 (MISCELLANEOUS) ×4
APR CLP MED LRG 5 ANG JAW (MISCELLANEOUS) ×2
BAG SPEC RTRVL LRG 6X4 10 (ENDOMECHANICALS) ×2
BLADE 10 SAFETY STRL DISP (BLADE) ×2 IMPLANT
BLADE SURG ROTATE 9660 (MISCELLANEOUS) IMPLANT
CANISTER SUCTION 2500CC (MISCELLANEOUS) ×4 IMPLANT
CATH REDDICK CHOLANGI 4FR 50CM (CATHETERS) ×2 IMPLANT
CHLORAPREP W/TINT 26ML (MISCELLANEOUS) ×4 IMPLANT
CLIP APPLIE 5 13 M/L LIGAMAX5 (MISCELLANEOUS) ×2 IMPLANT
COVER MAYO STAND STRL (DRAPES) ×2 IMPLANT
COVER SURGICAL LIGHT HANDLE (MISCELLANEOUS) ×4 IMPLANT
DEVICE TROCAR PUNCTURE CLOSURE (ENDOMECHANICALS) ×2 IMPLANT
DRAPE LAPAROSCOPIC ABDOMINAL (DRAPES) ×2 IMPLANT
ELECT REM PT RETURN 9FT ADLT (ELECTROSURGICAL) ×4
ELECTRODE REM PT RTRN 9FT ADLT (ELECTROSURGICAL) ×2 IMPLANT
GLOVE BIO SURGEON STRL SZ7 (GLOVE) ×2 IMPLANT
GLOVE BIO SURGEON STRL SZ7.5 (GLOVE) ×4 IMPLANT
GLOVE BIOGEL PI IND STRL 7.0 (GLOVE) IMPLANT
GLOVE BIOGEL PI IND STRL 8 (GLOVE) IMPLANT
GLOVE BIOGEL PI INDICATOR 7.0 (GLOVE) ×4
GLOVE BIOGEL PI INDICATOR 8 (GLOVE) ×2
GOWN STRL REUS W/ TWL LRG LVL3 (GOWN DISPOSABLE) ×6 IMPLANT
GOWN STRL REUS W/TWL LRG LVL3 (GOWN DISPOSABLE) ×12
IV CATH 14GX2 1/4 (CATHETERS) ×2 IMPLANT
KIT BASIN OR (CUSTOM PROCEDURE TRAY) ×4 IMPLANT
KIT ROOM TURNOVER OR (KITS) ×4 IMPLANT
LIQUID BAND (GAUZE/BANDAGES/DRESSINGS) ×4 IMPLANT
NS IRRIG 1000ML POUR BTL (IV SOLUTION) ×4 IMPLANT
PAD ARMBOARD 7.5X6 YLW CONV (MISCELLANEOUS) ×4 IMPLANT
POUCH SPECIMEN RETRIEVAL 10MM (ENDOMECHANICALS) ×4 IMPLANT
SCISSORS LAP 5X35 DISP (ENDOMECHANICALS) ×4 IMPLANT
SET IRRIG TUBING LAPAROSCOPIC (IRRIGATION / IRRIGATOR) ×4 IMPLANT
SLEEVE ENDOPATH XCEL 5M (ENDOMECHANICALS) ×10 IMPLANT
SPECIMEN JAR SMALL (MISCELLANEOUS) ×4 IMPLANT
SPONGE GAUZE 4X4 12PLY STER LF (GAUZE/BANDAGES/DRESSINGS) ×2 IMPLANT
SUT MNCRL AB 4-0 PS2 18 (SUTURE) ×6 IMPLANT
TOWEL OR 17X24 6PK STRL BLUE (TOWEL DISPOSABLE) ×4 IMPLANT
TOWEL OR 17X26 10 PK STRL BLUE (TOWEL DISPOSABLE) ×4 IMPLANT
TRAY LAPAROSCOPIC MC (CUSTOM PROCEDURE TRAY) ×4 IMPLANT
TROCAR BLADELESS 11MM (ENDOMECHANICALS) ×2 IMPLANT
TROCAR XCEL BLUNT TIP 100MML (ENDOMECHANICALS) ×4 IMPLANT
TROCAR XCEL NON-BLD 5MMX100MML (ENDOMECHANICALS) ×4 IMPLANT
TUBING INSUFFLATION (TUBING) ×4 IMPLANT

## 2015-10-05 NOTE — Anesthesia Procedure Notes (Signed)
Procedure Name: Intubation Date/Time: 10/05/2015 12:56 PM Performed by: Layla Maw Pre-anesthesia Checklist: Patient identified, Patient being monitored, Timeout performed, Emergency Drugs available and Suction available Patient Re-evaluated:Patient Re-evaluated prior to inductionOxygen Delivery Method: Circle System Utilized Preoxygenation: Pre-oxygenation with 100% oxygen Intubation Type: IV induction Ventilation: Mask ventilation without difficulty and Oral airway inserted - appropriate to patient size Laryngoscope Size: Miller and 3 Grade View: Grade I Tube type: Oral Tube size: 7.5 mm Number of attempts: 1 Airway Equipment and Method: Stylet Placement Confirmation: ETT inserted through vocal cords under direct vision,  positive ETCO2 and breath sounds checked- equal and bilateral Secured at: 21 cm Tube secured with: Tape Dental Injury: Teeth and Oropharynx as per pre-operative assessment

## 2015-10-05 NOTE — Op Note (Signed)
10/05/2015  2:01 PM  PATIENT:  Lee [name]  82 y.o. male  PRE-OPERATIVE DIAGNOSIS:  CHOLECYSTITIS  POST-OPERATIVE DIAGNOSIS:  CHOLECYSTITIS  PROCEDURE:  Procedure(s): LAPAROSCOPIC CHOLECYSTECTOMY   (N/A) INTRAOPERATIVE CHOLANGIOGRAM  SURGEON:  Surgeon(s) and Role:    * Jovita Kussmaul, MD - Primary    * Greer Pickerel, MD - Assisting  PHYSICIAN ASSISTANT:   ASSISTANTS: Dr. Redmond Pulling   ANESTHESIA:   general  EBL:     BLOOD ADMINISTERED:none  DRAINS: none   LOCAL MEDICATIONS USED:  MARCAINE     SPECIMEN:  Source of Specimen:  gallbladder  DISPOSITION OF SPECIMEN:  PATHOLOGY  COUNTS:  YES  TOURNIQUET:  * No tourniquets in log *  DICTATION: .Dragon Dictation   After informed consent was obtained the patient was brought to the operating room and placed in the supine position on the operating room table. After adequate induction of general anesthesia the patient's abdomen was prepped with ChloraPrep, allowed to dry, and draped in usual sterile manner. A site was chosen in the right upper quadrant access the abdominal cavity. This area was infiltrated with quarter percent Marcaine. A small incision was made with a 15 blade knife. A 5 mm Optiview port and camera were used to bluntly dissect through the layers of the abdominal wall under direct vision until access was gained to the abdominal cavity. The abdomen was then insufflated with carbon dioxide without difficulty. The abdomen was inspected. There were omental adhesions along the midline. The gallbladder was readily identified. Another 5 mm port was placed in the right upper quadrant under direct vision. Laparoscopic scissors were then used to take down some of the omental filmy adhesions to make room for an additional 5 mm port along the midline for a camera. This port was also placed under direct vision. An epigastric port was then also placed under direct vision. The camera was moved to the lower midline port. A blunt graspers  placed through the lateralmost 5 mm port and used to grasp the dome of the gallbladder. The gallbladder was already retracted superiorly by a previously placed percutaneous cholecystostomy tube. Another blunt grasper was placed to the other 5 mm port and used to retract on the body and neck of the gallbladder. Blunt dissection was carried out at the neck of the gallbladder until the gallbladder neck cystic duct junction was readily identified and a good window was created. A single clip was placed on the gallbladder neck. A small duct artery was made just below the clip with the laps Scissors. A 14-gauge Angiocathpercutaneously through the anterior bowel wall under direct vision. A Reddick cholangiogram catheter was placed through the Angiocath and flushed. The Reddick catheter was placed within the cystic duct and anchored in place with a clip. A flange gram was obtained that showed no filling defects and good emptying into the duodenum with a long length on the cystic duct. The anchoring clip and catheter was then removed from the patient. 3 clips were placed proximally on the cystic duct and the duct was divided between the 2 sets of clips. Posterior to this the cystic artery was identified and again dissected bluntly in a circumferential manner until a good window was created. 2 clips placed proximally and one distally on the artery and the artery was divided between the 2. Next a laparoscopic hook cautery device was used to separate the gallbladder from the liver bed. Once the gallbladder was detached from the liver bed the liver bed was inspected  and found to be hemostatic. The percutaneous cholecystostomy drain was removed and the connection to the abdominal wall was fulgurated with the cautery to the gallbladder was completely free. The epigastric port was then exchanged for a 10 mm port. The laparoscopic bag was inserted through the epigastric port and the gallbladder was placed in the bag and the bag was  sealed. The gallbladder was then removed with the bag through the epigastric port without difficulty. The abdomen was then irrigated with copious amounts of saline. The abdomen was inspected again and found to be hemostatic. The epigastric port was closed with a 0 Vicryl stitch and a suture passer. The ports were removed under direct vision and the gas was allowed to escape. The incisions were then closed with interrupted 4-0 Monocryl subcuticular stitches. Dermabond dressings were then applied. The patient tolerated the procedure well. At the end of the case all needle sponge and instrument counts were correct. The patient was then awakened and taken to recovery in stable condition. A sterile dressing was placed on the old drain site.  PLAN OF CARE: Admit for overnight observation  PATIENT DISPOSITION:  PACU - hemodynamically stable.   Delay start of Pharmacological VTE agent (>24hrs) due to surgical blood loss or risk of bleeding: no

## 2015-10-05 NOTE — H&P (Signed)
Lee [name]  Location: [facility] Patient #: [id number] DOB: [birth year] Married / Language: English / Race: White Male   History of Present Illness Patient words: GB check.  The patient is a 82 year old male who presents for a follow-up for Abdominal pain. The patient is a 82 year old white male who was hospitalized about 2 months ago with severe cholecystitis. He has significant comorbid conditions and was treated with a percutaneous cholecystostomy tube. He has done well and has no complaints today. He denies any abdominal pain. His appetite is good and his bowels are working normally with some looseness. Since his last visit he has had a cholangiogram that shows no residual stones in the common bile duct. He has also met with cardiology who has cleared him for surgery although he is at moderate risk   Problem List/Past Medical  CHOLECYSTITIS (K81.9)  Other Problems Bladder Problems Cancer Cerebrovascular Accident Chronic Obstructive Lung Disease Emphysema Of Lung Gastroesophageal Reflux Disease High blood pressure Home Oxygen Use Hypercholesterolemia Myocardial infarction Transfusion history Umbilical Hernia Repair  Past Surgical History Aneurysm Repair Colon Polyp Removal - Colonoscopy Foot Surgery Left.  Diagnostic Studies History Colonoscopy 1-5 years ago  Allergies Ciprofloxacin *CHEMICALS*  Medication History  Albuterol Sulfate (4MG Tablet, Oral) Active. Carvedilol (12.5MG Tablet, Oral) Active. Plavix (75MG Tablet, Oral) Active. Zetia (10MG Tablet, Oral) Active. Losartan Potassium (50MG Tablet, Oral) Active. Nitrostat (0.4MG Tab Sublingual, Sublingual) Active. Pantoprazole Sodium (40MG Tablet DR, Oral) Active. Medications Reconciled  Social History  Caffeine use Carbonated beverages, Coffee, Tea. No drug use Tobacco use Former smoker.  Family History Cerebrovascular Accident Father. Colon Cancer  Brother. Heart Disease Father. Hypertension Father.    Review of Systems  General Not Present- Appetite Loss, Chills, Fatigue, Fever, Night Sweats, Weight Gain and Weight Loss. Skin Not Present- Change in Wart/Mole, Dryness, Hives, Jaundice, New Lesions, Non-Healing Wounds, Rash and Ulcer. HEENT Present- Hearing Loss, Seasonal Allergies and Wears glasses/contact lenses. Not Present- Earache, Hoarseness, Nose Bleed, Oral Ulcers, Ringing in the Ears, Sinus Pain, Sore Throat, Visual Disturbances and Yellow Eyes. Respiratory Present- Difficulty Breathing. Not Present- Bloody sputum, Chronic Cough, Snoring and Wheezing. Breast Not Present- Breast Mass, Breast Pain, Nipple Discharge and Skin Changes. Cardiovascular Present- Difficulty Breathing Lying Down and Shortness of Breath. Not Present- Chest Pain, Leg Cramps, Palpitations, Rapid Heart Rate and Swelling of Extremities. Gastrointestinal Present- Abdominal Pain. Not Present- Bloating, Bloody Stool, Change in Bowel Habits, Chronic diarrhea, Constipation, Difficulty Swallowing, Excessive gas, Gets full quickly at meals, Hemorrhoids, Indigestion, Nausea, Rectal Pain and Vomiting. Male Genitourinary Present- Urine Leakage. Not Present- Blood in Urine, Change in Urinary Stream, Frequency, Impotence, Nocturia, Painful Urination and Urgency.  Vitals  Weight: 182.13 lb Height: 66in Body Surface Area: 1.92 m Body Mass Index: 29.4 kg/m  BP: 124/68 (Sitting, Left Arm, Standard)       Physical Exam  General Mental Status-Alert. General Appearance-Consistent with stated age. Hydration-Well hydrated. Voice-Normal.  Head and Neck Head-normocephalic, atraumatic with no lesions or palpable masses. Trachea-midline. Thyroid Gland Characteristics - normal size and consistency.  Eye Eyeball - Bilateral-Extraocular movements intact. Sclera/Conjunctiva - Bilateral-No scleral icterus.  Chest and Lung Exam Chest and lung  exam reveals -quiet, even and easy respiratory effort with no use of accessory muscles and on auscultation, normal breath sounds, no adventitious sounds and normal vocal resonance. Inspection Chest Wall - Normal. Back - normal.  Cardiovascular Cardiovascular examination reveals -normal heart sounds, regular rate and rhythm with no murmurs and normal pedal pulses bilaterally.  Abdomen Note: The abdomen is soft and nontender. There is a moderate size reducible hernia in the epigastric and central abdominal area. The percutaneous cholecystostomy tube is intact and draining bile.   Neurologic Neurologic evaluation reveals -alert and oriented x 3 with no impairment of recent or remote memory. Mental Status-Normal.  Musculoskeletal Normal Exam - Left-Upper Extremity Strength Normal and Lower Extremity Strength Normal. Normal Exam - Right-Upper Extremity Strength Normal and Lower Extremity Strength Normal.  Lymphatic Head & Neck  General Head & Neck Lymphatics: Bilateral - Description - Normal. Axillary  General Axillary Region: Bilateral - Description - Normal. Tenderness - Non Tender. Femoral & Inguinal  Generalized Femoral & Inguinal Lymphatics: Bilateral - Description - Normal. Tenderness - Non Tender.    Assessment & Plan  CHOLECYSTITIS (K81.9) Impression: The patient was hospitalized about 2 months ago with cholecystitis that was treated with a percutaneous drain. He has tolerated this well. He was cleared for surgery recently by his cardiologist although he has stable moderate risk. I think at this point he would benefit from having his gallbladder removed area did have discussed with him in detail the risks and benefits of the operation to remove the gallbladder as well as some of the technical aspects and he understands and wishes to proceed. We may try laparoscopic removal of the gallbladder but I suspect that we will end up open given his previous surgery and his  ventral hernia.    Signed by Luella Cook, MD

## 2015-10-05 NOTE — Interval H&P Note (Signed)
History and Physical Interval Note:  10/05/2015 12:22 PM  Lee [name]  has presented today for surgery, with the diagnosis of CHOLECYSTITIS  The various methods of treatment have been discussed with the patient and family. After consideration of risks, benefits and other options for treatment, the patient has consented to  Procedure(s): [place] (N/A) as a surgical intervention .  The patient's history has been reviewed, patient examined, no change in status, stable for surgery.  I have reviewed the patient's chart and labs.  Questions were answered to the patient's satisfaction.     TOTH III,[name]

## 2015-10-05 NOTE — Anesthesia Preprocedure Evaluation (Addendum)
Anesthesia Evaluation  Patient identified by MRN, date of birth, ID band Patient awake    Airway Mallampati: II  TM Distance: >3 FB Neck ROM: Full    Dental  (+) Teeth Intact, Poor Dentition, Loose, Dental Advisory Given,    Pulmonary former smoker,    breath sounds clear to auscultation + decreased breath sounds      Cardiovascular hypertension,  Rhythm:Regular Rate:Normal     Neuro/Psych    GI/Hepatic   Endo/Other    Renal/GU      Musculoskeletal   Abdominal   Peds  Hematology   Anesthesia Other Findings   Reproductive/Obstetrics                           Anesthesia Physical Anesthesia Plan  ASA: III  Anesthesia Plan: General   Post-op Pain Management:    Induction: Intravenous  Airway Management Planned: Oral ETT  Additional Equipment:   Intra-op Plan:   Post-operative Plan: Extubation in OR  Informed Consent: I have reviewed the patients History and Physical, chart, labs and discussed the procedure including the risks, benefits and alternatives for the proposed anesthesia with the patient or authorized representative who has indicated his/her understanding and acceptance.   Dental advisory given  Plan Discussed with: CRNA and Anesthesiologist  Anesthesia Plan Comments: (Cholecystitis, hospitalized with E. Coli sepsis secondary to GB 04/2015 Hypertension H/O Stroke S/P AAA repair COPD/Pulmonary fibrosis LBBB and LV dysfunction Ef 45% by echo percutaneous gallbladder drain  Plan GA with oral ETT )      Anesthesia Quick Evaluation

## 2015-10-05 NOTE — Progress Notes (Signed)
Lee [name] is an 82 year old male who underwent a laparoscopic cholecystectomy under GETA. Prior to surgery, it was noted that he had a loose tooth  (lower front incisor). I discussed with the patient and his wife that there was a significant risk that the tooth could be dislodged during the procedure. They both understood this risk.   During insertion of an orogastric tube after intubation the tooth was dislodged and saved in a specimen cup. Following the surgery after emergence, I informed his wife, son, and daughter in law that the tooth was dislodged and removed. They understood and stated that this not unexpected.  Roberts Gaudy

## 2015-10-05 NOTE — Transfer of Care (Signed)
Immediate Anesthesia Transfer of Care Note  Patient: Lee [name]  Procedure(s) Performed: Procedure(s): LAPAROSCOPIC CHOLECYSTECTOMY   (N/A) INTRAOPERATIVE CHOLANGIOGRAM  Patient Location: PACU  Anesthesia Type:General  Level of Consciousness: awake, alert , oriented and patient cooperative  Airway & Oxygen Therapy: Patient Spontanous Breathing and Patient connected to nasal cannula oxygen  Post-op Assessment: Report given to RN, Post -op Vital signs reviewed and stable and Patient moving all extremities X 4  Post vital signs: Reviewed and stable  Last Vitals:  Filed Vitals:   10/05/15 1154 10/05/15 1412  BP: 128/56   Pulse: 74   Temp: 36.4 C 36.3 C  Resp: 18     Complications: No apparent anesthesia complications

## 2015-10-06 ENCOUNTER — Encounter (HOSPITAL_COMMUNITY): Payer: Self-pay | Admitting: General Surgery

## 2015-10-06 DIAGNOSIS — K8010 Calculus of gallbladder with chronic cholecystitis without obstruction: Secondary | ICD-10-CM | POA: Diagnosis not present

## 2015-10-06 MED ORDER — OXYCODONE-ACETAMINOPHEN 5-325 MG PO TABS: 1.0000 | ORAL_TABLET | ORAL | Status: DC | PRN

## 2015-10-06 NOTE — Progress Notes (Signed)
1 Day Post-Op  Subjective: No complaints  Objective: Vital signs in last 24 hours: Temp:  [97.4 F (36.3 C)-98.5 F (36.9 C)] 97.7 F (36.5 C) (02/14 0453) Pulse Rate:  [72-93] 86 (02/14 0453) Resp:  [16-25] 20 (02/14 0453) BP: (128-168)/(56-81) 157/66 mmHg (02/14 0453) SpO2:  [95 %-99 %] 96 % (02/14 0453) Weight:  [83.9 kg (184 lb 15.5 oz)-84.823 kg (187 lb)] 83.9 kg (184 lb 15.5 oz) (02/13 1831) Last BM Date: 10/05/15  Intake/Output from previous day: 02/13 0701 - 02/14 0700 In: 1200 [I.V.:1200] Out: 1015 [Urine:1000; Blood:15] Intake/Output this shift:    Resp: clear to auscultation bilaterally Cardio: regular rate and rhythm GI: soft, nontender.  Lab Results:  No results for input(s): WBC, HGB, HCT, PLT in the last 72 hours. BMET No results for input(s): NA, K, CL, CO2, GLUCOSE, BUN, CREATININE, CALCIUM in the last 72 hours. PT/INR No results for input(s): LABPROT, INR in the last 72 hours. ABG No results for input(s): PHART, HCO3 in the last 72 hours.  Invalid input(s): PCO2, PO2  Studies/Results: Dg Cholangiogram Operative  10/05/2015  CLINICAL DATA:  82 year old male with a history of cholecystitis, and prior treatment with percutaneous cholecystostomy. EXAM: INTRAOPERATIVE CHOLANGIOGRAM TECHNIQUE: Cholangiographic images from the C-arm fluoroscopic device were submitted for interpretation post-operatively. Please see the procedural report for the amount of contrast and the fluoroscopy time utilized. COMPARISON:  Multiple prior, most recent 09/30/2015 FINDINGS: Surgical instruments project over the upper abdomen. There is cannulation of the cystic duct/gallbladder neck, with antegrade infusion of contrast. Caliber of the extrahepatic ductal system within normal limits. No large filling defect identified. Free flow of contrast across the ampulla. IMPRESSION: Intraoperative cholangiogram demonstrates extrahepatic biliary ducts of unremarkable caliber, with no large  filling defect identified. Free flow of contrast across the ampulla. Please refer to the dictated operative report for full details of intraoperative findings and procedure Signed, Dulcy Fanny. Earleen Newport, DO [facility] Electronically Signed   By: Corrie Mckusick D.O.   On: 10/05/2015 14:06    Anti-infectives: Anti-infectives    Start     Dose/Rate Route Frequency Ordered Stop   10/05/15 1300  ceFAZolin (ANCEF) IVPB 2 g/50 mL premix     2 g 100 mL/hr over 30 Minutes Intravenous To [facility] 10/02/15 1309 10/05/15 1315      Assessment/Plan: s/p Procedure(s): LAPAROSCOPIC CHOLECYSTECTOMY   (N/A) INTRAOPERATIVE CHOLANGIOGRAM Advance diet  Discharge today if he tolerates breakfast     TOTH III,Cipriano Millikan S 10/06/2015

## 2015-10-06 NOTE — Anesthesia Postprocedure Evaluation (Signed)
Anesthesia Post Note  Patient: Lee [name]  Procedure(s) Performed: Procedure(s) (LRB): LAPAROSCOPIC CHOLECYSTECTOMY   (N/A) INTRAOPERATIVE CHOLANGIOGRAM  Patient location during evaluation: PACU Anesthesia Type: General Level of consciousness: awake and awake and alert Pain management: pain level controlled Vital Signs Assessment: post-procedure vital signs reviewed and stable Respiratory status: spontaneous breathing and nonlabored ventilation Anesthetic complications: no    Last Vitals:  Filed Vitals:   10/06/15 0141 10/06/15 0453  BP: 158/67 157/66  Pulse: 93 86  Temp: 36.5 C 36.5 C  Resp: 20 20    Last Pain:  Filed Vitals:   10/06/15 0454  PainSc: 0-No pain                 Kahmari Koller COKER

## 2015-10-06 NOTE — Progress Notes (Signed)
Discussed discharge summary with patient. Reviewed all medications with patient. Patient received Rx. Patient ready for discharge. 

## 2015-11-11 ENCOUNTER — Ambulatory Visit (INDEPENDENT_AMBULATORY_CARE_PROVIDER_SITE_OTHER): Payer: Medicare Other | Admitting: Internal Medicine

## 2015-11-11 ENCOUNTER — Encounter: Payer: Self-pay | Admitting: Internal Medicine

## 2015-11-11 VITALS — BP 136/72 | HR 78 | Ht 66.0 in | Wt 191.2 lb

## 2015-11-11 DIAGNOSIS — J438 Other emphysema: Secondary | ICD-10-CM | POA: Diagnosis not present

## 2015-11-11 DIAGNOSIS — J84112 Idiopathic pulmonary fibrosis: Secondary | ICD-10-CM

## 2015-11-11 NOTE — Patient Instructions (Signed)
ICD-9-CM ICD-10-CM   1. Other emphysema ([place]) 492.8 J43.8   2. IPF (idiopathic pulmonary fibrosis) ([facility]) 516.31 J84.112     Overall lung disease is stable Mild yellow phlegm can be colonization of some respiratory bacteria Because you otherwise well and he had a recent antibiotic course we have agreed to watch this closely At any sign of a flareup of her lungs please call us promptly - we will consider treating it with antibiotic and steroids  Follow-up - 3 months or sooner if needed

## 2015-11-11 NOTE — Progress Notes (Signed)
Subjective:     Patient ID: Lee [name], male   DOB: 02/11/1931, 82 y.o.   MRN: [id number]  HPI   OV 11/11/2015  Chief Complaint  Patient presents with  . Follow-up    Pt states his breathing is unchanged since last OV. Pt c/o prod cough with yellow mucus, occasional wheezing with activity. Pt denies CP/tightness.     Follow-up Emphysema with pulmonary fibrosis presumed IPF  Last seen a few months ago has preop clearance for cholecystectomy. Review of the chart shows that he underwent cholecystectomy on Valentine's Day 2017. According to the notes it was an uneventful course. He now presents with his wife. Daughter Ivin Booty is not with him today. Wife reports that there is been a wound infection in the surgical incisional area. This was treated with antibiotics that ended may be a week ago. Symptoms started maybe to one half weeks ago. Wife reports that since discharge from the hospital he has mild yellow sputum that is now his new baseline. This happened after discharge from the hospital and is therefore new. Patient and wife consistently denies any worsening shortness of breath, wheezing, chest tightness, cough, increased dyspnea, fatigue or fever. Anabiotic course for wound infection did help lighten the color of sputum but is back increased now.      has a past medical history of Interstitial lung disease ([place]); HTN (hypertension); AAA (abdominal aortic aneurysm) ([place]); Allergic rhinitis; Erectile dysfunction; Hyperlipidemia; Obesity; PVD (peripheral vascular disease) ([place]); Basal cell carcinoma of face; LBBB (left bundle branch block); COPD (chronic obstructive pulmonary disease) ([place]); Chronic respiratory failure ([place]); Chronic systolic CHF (congestive heart failure) ([place]); Cholecystitis; Stroke [facility]); History of hiatal hernia; Anxiety; Shingles; and On home oxygen therapy.   reports that he quit smoking about 41 years ago. His smoking use included Cigarettes. He has a 25 pack-year  smoking history. He has never used smokeless tobacco.  Past Surgical History  Procedure Laterality Date  . Aorta surgery  2002  . Urethral stricture dilatation  late 1970s    urinary tract stretch  . Laparoscopic cholecystectomy  10/05/2015  . Hernia repair  1979  . Cholecystectomy N/A 10/05/2015    Procedure: LAPAROSCOPIC CHOLECYSTECTOMY  ;  Surgeon: Autumn Messing III, MD;  Location: [facility];  Service: General;  Laterality: N/A;  . Intraoperative cholangiogram  10/05/2015    Procedure: INTRAOPERATIVE CHOLANGIOGRAM;  Surgeon: Autumn Messing III, MD;  Location: [facility];  Service: General;;    Allergies  Allergen Reactions  . Ciprofloxacin Other (See Comments)    Tingle feeling throughout body  . Tape Other (See Comments)    Redness, Please use "paper" tape    Immunization History  Administered Date(s) Administered  . Influenza Split 06/22/2012, 06/22/2013, 06/03/2015  . Influenza-Unspecified 06/22/2014  . Pneumococcal Polysaccharide-23 06/22/2010  . Tdap 09/22/2012    Family History  Problem Relation Age of Onset  . Hypertension Mother   . Colon cancer Brother   . Stroke Father   . Heart disease Father      Current outpatient prescriptions:  .  albuterol (PROVENTIL HFA;VENTOLIN HFA) 108 (90 BASE) MCG/ACT inhaler, Inhale 2 puffs into the lungs every 6 (six) hours as needed for wheezing or shortness of breath., Disp: 1 Inhaler, Rfl: 0 .  carvedilol (COREG) 12.5 MG tablet, Take 1 tablet (12.5 mg total) by mouth 2 (two) times daily with a meal., Disp: 60 tablet, Rfl: 6 .  Cholecalciferol (VITAMIN D PO), Take 1 tablet by mouth daily., Disp: , Rfl:  .  clopidogrel (PLAVIX) 75 MG tablet, Take 75 mg by mouth daily. , Disp: , Rfl: 6 .  ezetimibe (ZETIA) 10 MG tablet, Take 10 mg by mouth daily. , Disp: , Rfl:  .  losartan (COZAAR) 50 MG tablet, Take 50 mg by mouth daily., Disp: , Rfl:  .  Multiple Vitamin (MULTIVITAMIN WITH MINERALS) TABS tablet, Take 1 tablet by mouth daily., Disp: , Rfl:  .   nitroGLYCERIN (NITROSTAT) 0.4 MG SL tablet, Place 1 tablet (0.4 mg total) under the tongue every 5 (five) minutes as needed for chest pain., Disp: 30 tablet, Rfl: 0 .  pantoprazole (PROTONIX) 40 MG tablet, Take 40 mg by mouth daily., Disp: , Rfl: 11 .  Pitavastatin Calcium (LIVALO) 2 MG TABS, Take 2 mg by mouth See admin instructions. Every 3rd day, Disp: , Rfl:  .  Probiotic Product ([place]) CAPS, Take 1 capsule by mouth daily., Disp: , Rfl:  .  tiotropium (SPIRIVA) 18 MCG inhalation capsule, Place 1 capsule (18 mcg total) into inhaler and inhale daily., Disp: 30 capsule, Rfl: 0    Review of Systems     Objective:   Physical Exam Filed Vitals:   11/11/15 1445  BP: 136/72  Pulse: 78  Height: 5\' 6"  (1.676 m)  Weight: 191 lb 3.2 oz (86.728 kg)  SpO2: 97%  General exam: Elderly male. Looks well. A little bit more deconditioned than prior visits Muscular skeletal: Walks with a cane he did slow steady gait Psychiatric: Pleasant affect Respiratory exam: Clear to auscultation bilaterally. Faint basal crackles. No distress Cardiovascular: Normal heart sounds regular rate and rhythm. Abdomen: Obese, some incisional hernia present. There is a small dressing that is dry in the right upper quadrant area. Skin exam: Exposed areas appear intact     Assessment:       ICD-9-CM ICD-10-CM   1. Other emphysema ([place]) 492.8 J43.8   2. IPF (idiopathic pulmonary fibrosis) ([facility]) 516.31 J84.112        Plan:     Overall lung disease is stable Mild yellow phlegm can be colonization of some respiratory bacteria Because you otherwise well and he had a recent antibiotic course we have agreed to watch this closely At any sign of a flareup of her lungs please call us promptly - we will consider treating it with antibiotic and steroids  Follow-up - 3 months or sooner if needed  (> 50% of this 15 min visit spent in face to face counseling or/and coordination of care)   Dr. Brand Males, M.D., [facility].C.P Pulmonary and Critical Care Medicine Staff Physician [facility] Pager: [phone], If no answer or between  15:00h - 7:00h: call [phone]  11/11/2015 3:15 PM

## 2015-11-28 ENCOUNTER — Other Ambulatory Visit: Payer: Self-pay | Admitting: Cardiology

## 2016-01-26 ENCOUNTER — Ambulatory Visit (INDEPENDENT_AMBULATORY_CARE_PROVIDER_SITE_OTHER)
Admission: RE | Admit: 2016-01-26 | Discharge: 2016-01-26 | Disposition: A | Discharge status: Home / Self Care | Payer: Medicare Other | Source: Ambulatory Visit | Attending: Vascular Surgery | Admitting: Vascular Surgery

## 2016-01-26 ENCOUNTER — Other Ambulatory Visit (HOSPITAL_COMMUNITY): Payer: Self-pay | Admitting: Internal Medicine

## 2016-01-26 ENCOUNTER — Ambulatory Visit (HOSPITAL_COMMUNITY)
Admission: RE | Admit: 2016-01-26 | Discharge: 2016-01-26 | Disposition: A | Discharge status: Home / Self Care | Payer: Medicare Other | Source: Ambulatory Visit | Attending: Vascular Surgery | Admitting: Vascular Surgery

## 2016-01-26 DIAGNOSIS — I714 Abdominal aortic aneurysm, without rupture, unspecified: Secondary | ICD-10-CM

## 2016-01-26 DIAGNOSIS — F419 Anxiety disorder, unspecified: Secondary | ICD-10-CM | POA: Diagnosis not present

## 2016-01-26 DIAGNOSIS — I110 Hypertensive heart disease with heart failure: Secondary | ICD-10-CM | POA: Insufficient documentation

## 2016-01-26 DIAGNOSIS — E785 Hyperlipidemia, unspecified: Secondary | ICD-10-CM | POA: Insufficient documentation

## 2016-01-26 DIAGNOSIS — I447 Left bundle-branch block, unspecified: Secondary | ICD-10-CM | POA: Insufficient documentation

## 2016-01-26 DIAGNOSIS — E669 Obesity, unspecified: Secondary | ICD-10-CM | POA: Insufficient documentation

## 2016-01-26 DIAGNOSIS — I739 Peripheral vascular disease, unspecified: Secondary | ICD-10-CM

## 2016-01-26 DIAGNOSIS — I5022 Chronic systolic (congestive) heart failure: Secondary | ICD-10-CM | POA: Insufficient documentation

## 2016-02-22 ENCOUNTER — Encounter: Payer: Self-pay | Admitting: Internal Medicine

## 2016-02-22 ENCOUNTER — Ambulatory Visit (INDEPENDENT_AMBULATORY_CARE_PROVIDER_SITE_OTHER): Payer: Medicare Other | Admitting: Internal Medicine

## 2016-02-22 VITALS — BP 126/70 | HR 92 | Ht 66.0 in | Wt 199.0 lb

## 2016-02-22 DIAGNOSIS — Z01811 Encounter for preprocedural respiratory examination: Secondary | ICD-10-CM | POA: Diagnosis not present

## 2016-02-22 DIAGNOSIS — J438 Other emphysema: Secondary | ICD-10-CM | POA: Diagnosis not present

## 2016-02-22 DIAGNOSIS — J84112 Idiopathic pulmonary fibrosis: Secondary | ICD-10-CM

## 2016-02-22 NOTE — Progress Notes (Signed)
Subjective:     Patient ID: Lee [name], male   DOB: Feb 11, 1931, 82 y.o.   MRN: [id number]  HPI   OV 02/22/2016  Chief Complaint  Patient presents with  . Follow-up    pt states breathing is baseline since last OV. c/o sob w/exertion, occ prod cough w/yellow mucus. 2L 02 @ bedtime.   82 year old male with underlying emphysema and pulmonary fibrosis IPF. Presents for routine follow-up. This is a 3 month follow-up. Last visit he started having new baseline yellow sputum. Therefore this is a swallow than usual follow-up. He is here with his wife. He has some mild yellow phlegm early in the morning but otherwise fine. The states that sputum is baseline. Shortness of breath is baseline cough is baseline. Uses his inhaler and oxygen at night. There are no new issues. He prefers to avoid medications particularly because of the donut hole issue. He is not interested in antibiotic treatment to get rid of the yellow sputum wife states that he is going to have cataract surgery and wants to know preoperative risk      has a past medical history of Interstitial lung disease ([place]); HTN (hypertension); AAA (abdominal aortic aneurysm) ([place]); Allergic rhinitis; Erectile dysfunction; Hyperlipidemia; Obesity; PVD (peripheral vascular disease) ([place]); Basal cell carcinoma of face; LBBB (left bundle branch block); COPD (chronic obstructive pulmonary disease) ([place]); Chronic respiratory failure ([place]); Chronic systolic CHF (congestive heart failure) ([place]); Cholecystitis; Stroke [facility]); History of hiatal hernia; Anxiety; Shingles; and On home oxygen therapy.   reports that he quit smoking about 41 years ago. His smoking use included Cigarettes. He has a 25 pack-year smoking history. He has never used smokeless tobacco.  Past Surgical History  Procedure Laterality Date  . Aorta surgery  2002  . Urethral stricture dilatation  late 1970s    urinary tract stretch  . Laparoscopic cholecystectomy  10/05/2015  . Hernia  repair  1979  . Cholecystectomy N/A 10/05/2015    Procedure: LAPAROSCOPIC CHOLECYSTECTOMY  ;  Surgeon: Autumn Messing III, MD;  Location: [facility];  Service: General;  Laterality: N/A;  . Intraoperative cholangiogram  10/05/2015    Procedure: INTRAOPERATIVE CHOLANGIOGRAM;  Surgeon: Autumn Messing III, MD;  Location: [facility];  Service: General;;    Allergies  Allergen Reactions  . Ciprofloxacin Other (See Comments)    Tingle feeling throughout body  . Tape Other (See Comments)    Redness, Please use "paper" tape    Immunization History  Administered Date(s) Administered  . Influenza Split 06/22/2012, 06/22/2013, 06/03/2015  . Influenza-Unspecified 06/22/2014  . Pneumococcal Polysaccharide-23 06/22/2010  . Tdap 09/22/2012    Family History  Problem Relation Age of Onset  . Hypertension Mother   . Colon cancer Brother   . Stroke Father   . Heart disease Father      Current outpatient prescriptions:  .  albuterol (PROVENTIL HFA;VENTOLIN HFA) 108 (90 BASE) MCG/ACT inhaler, Inhale 2 puffs into the lungs every 6 (six) hours as needed for wheezing or shortness of breath., Disp: 1 Inhaler, Rfl: 0 .  carvedilol (COREG) 12.5 MG tablet, TAKE 1 TABLET (12.5 MG TOTAL) BY MOUTH 2 (TWO) TIMES DAILY WITH A MEAL., Disp: 180 tablet, Rfl: 2 .  Cholecalciferol (VITAMIN D PO), Take 1 tablet by mouth daily., Disp: , Rfl:  .  clopidogrel (PLAVIX) 75 MG tablet, Take 75 mg by mouth daily. , Disp: , Rfl: 6 .  ezetimibe (ZETIA) 10 MG tablet, Take 10 mg by mouth daily. , Disp: , Rfl:  .  losartan (COZAAR) 50 MG tablet, Take 50 mg by mouth daily., Disp: , Rfl:  .  Multiple Vitamin (MULTIVITAMIN WITH MINERALS) TABS tablet, Take 1 tablet by mouth daily., Disp: , Rfl:  .  nitroGLYCERIN (NITROSTAT) 0.4 MG SL tablet, Place 1 tablet (0.4 mg total) under the tongue every 5 (five) minutes as needed for chest pain., Disp: 30 tablet, Rfl: 0 .  pantoprazole (PROTONIX) 40 MG tablet, Take 40 mg by mouth daily., Disp: , Rfl: 11 .   Pitavastatin Calcium (LIVALO) 2 MG TABS, Take 2 mg by mouth See admin instructions. Every 3rd day, Disp: , Rfl:  .  Probiotic Product ([place]) CAPS, Take 1 capsule by mouth daily., Disp: , Rfl:  .  tiotropium (SPIRIVA) 18 MCG inhalation capsule, Place 1 capsule (18 mcg total) into inhaler and inhale daily., Disp: 30 capsule, Rfl: 0    Review of Systems     Objective:   Physical Exam  Constitutional: He is oriented to person, place, and time. He appears well-developed and well-nourished. No distress.  HENT:  Head: Normocephalic and atraumatic.  Right Ear: External ear normal.  Left Ear: External ear normal.  Mouth/Throat: Oropharynx is clear and moist. No oropharyngeal exudate.  Eyes: Conjunctivae and EOM are normal. Pupils are equal, round, and reactive to light. Right eye exhibits no discharge. Left eye exhibits no discharge. No scleral icterus.  Neck: Normal range of motion. Neck supple. No JVD present. No tracheal deviation present. No thyromegaly present.  Cardiovascular: Normal rate, regular rhythm and intact distal pulses.  Exam reveals no gallop and no friction rub.   No murmur heard. Pulmonary/Chest: Effort normal. No respiratory distress. He has no wheezes. He has rales. He exhibits no tenderness.  Abdominal: Soft. Bowel sounds are normal. He exhibits no distension and no mass. There is no tenderness. There is no rebound and no guarding.  Visceral obesity present  Musculoskeletal: Normal range of motion. He exhibits no edema or tenderness.  Lymphadenopathy:    He has no cervical adenopathy.  Neurological: He is alert and oriented to person, place, and time. He has normal reflexes. No cranial nerve deficit. Coordination normal.  Skin: Skin is warm and dry. No rash noted. He is not diaphoretic. No erythema. No pallor.  Psychiatric: He has a normal mood and affect. His behavior is normal. Judgment and thought content normal.  Nursing note and vitals  reviewed.  Filed Vitals:   02/22/16 1412  BP: 126/70  Pulse: 92  Height: 5\' 6"  (1.676 m)  Weight: 199 lb (90.266 kg)  SpO2: 93%       Assessment:       ICD-9-CM ICD-10-CM   1. IPF (idiopathic pulmonary fibrosis) (HCC) 516.31 J84.112   2. Other emphysema ([place]) 492.8 J43.8   3. Preoperative respiratory examination V72.82 Z01.811        Plan:     IPF (idiopathic pulmonary fibrosis) (HCC) Other emphysema (HCC)   - stable disease - yellow phlegm might be your new baseline from early 2017 - continue inhales and o2 at betime - flu shot in fall  Preoperative respiratory examination - low-moderate risk for pulmonary complications from cataract surgery as long as your are not having flare that day   Followup 6-7 months or sooner if needed   Dr. Brand Males, M.D., [facility].C.P Pulmonary and Critical Care Medicine Staff Physician [place] [facility] Pager: [phone], If no answer or between  15:00h - 7:00h: call [phone]  [id number]  02/22/2016 2:32 PM

## 2016-02-22 NOTE — Patient Instructions (Addendum)
IPF (idiopathic pulmonary fibrosis) (HCC) Other emphysema (HCC)   - stable disease - yellow phlegm might be your new baseline from early 2017 - continue inhales and o2 at betime - flu shot in fall  Preoperative respiratory examination - low-moderate risk for pulmonary complications from cataract surgery as long as your are not having flare that day   Followup 6-7 months or sooner if needed

## 2016-03-10 ENCOUNTER — Ambulatory Visit (INDEPENDENT_AMBULATORY_CARE_PROVIDER_SITE_OTHER): Payer: Medicare Other | Admitting: Cardiology

## 2016-03-10 ENCOUNTER — Encounter: Payer: Self-pay | Admitting: Cardiology

## 2016-03-10 VITALS — BP 144/76 | HR 74 | Ht 66.0 in | Wt 198.6 lb

## 2016-03-10 DIAGNOSIS — Z9889 Other specified postprocedural states: Secondary | ICD-10-CM

## 2016-03-10 DIAGNOSIS — E785 Hyperlipidemia, unspecified: Secondary | ICD-10-CM | POA: Diagnosis not present

## 2016-03-10 DIAGNOSIS — I428 Other cardiomyopathies: Secondary | ICD-10-CM

## 2016-03-10 DIAGNOSIS — I119 Hypertensive heart disease without heart failure: Secondary | ICD-10-CM

## 2016-03-10 DIAGNOSIS — I429 Cardiomyopathy, unspecified: Secondary | ICD-10-CM

## 2016-03-10 DIAGNOSIS — I519 Heart disease, unspecified: Secondary | ICD-10-CM

## 2016-03-10 DIAGNOSIS — I248 Other forms of acute ischemic heart disease: Secondary | ICD-10-CM

## 2016-03-10 DIAGNOSIS — I10 Essential (primary) hypertension: Secondary | ICD-10-CM

## 2016-03-10 NOTE — Progress Notes (Signed)
PCP: Jerlyn Ly, MD  PULMONOLOGIST: Dr. Brand Males  Clinic Note: Chief Complaint  Patient presents with  . Follow-up    6 months; new cardiologist. Former Lee [name] patient  . Congestive Heart Failure    HPI: Lee [name] is a 82 y.o. male with a PMH below who presents today for Establishing new cardiologist care retirement of Dr. Darlin Coco. He has a history of abdominal aortic aneurysm status post repair in 2002 as well as hypertension and hyperlipidemia. He also has baseline left bundle branch block that seems to be may be intermittent or rate related. He is on Plavix for stroke history. He has COPD as well as pulmonary fibrosis with chronic respiratory failure followed by Dr. Brand Males from Pulmonary Medicine  He was diagnosed with systolic diastolic heart failure back in September 2016. This was in the setting of Escherichia coli sepsis from cholecystitis and right upper lobe pneumonia as well as Citrobacter UTI. He had a cholecystostomy drain done and finally had a laparoscopic cholecystectomy done. His hospitalization was compensated by acute hypoxic failure due to accommodation of COPD and heart failure. He was treated with diuresis and steroids/nebulizers. He also had acute insufficiency that has resolved.  - His inpatient echocardiogram at that time showed reduced ejection fraction down to 40 and 45% from previously recorded 50-%. Also noted was a left bundle branch block which may very well of lead to misread of EF. Metoprolol was changed to carvedilol.  Lee [name] was last seen on 08/28/2015 by Dr. Mare Ferrari this was actually prior to his laparoscopic cholecystectomy in January. He actually was doing fine at that time without any chest pain or dyspnea. He was on home oxygen for nighttime.   Recent Hospitalizations: Admitted for laparoscopic cholecystectomy February 2017   his last visit with Dr. Chase Caller was earlier this month. He noted baseline  shortness of breath and cough. Otherwise no major issues.  Studies Reviewed:    Transthoracic Echo September 2016: EF 40-45% with mid-apical anteroseptal akinesis. Moderately increased PA pressures of roughly 48 mmHg. -> The new finding of anterior akinesis was evaluated with a Myoview   Myoview October 2016:   Nuclear stress EF: 49%.  Defect 1: There is a small defect of mild severity present in the basal inferoseptal and mid inferoseptal location.  This is a low risk study.  The left ventricular ejection fraction is mildly decreased (45-54%).   Small inferoseptal defect likely artefact secondary to LBBB and to adjacent noncardiac activity.  No ischemia. Mild LV systolic dysfunction with mild inferoseptal hypokinesis likely related to paradoxical septal motion from LBBB.  Interval History: Lee [name] presents today to establish care with his cardiologist. He is doing quite well without any major complaints. He has been following with Dr. early for his abdominal aortic aneurysm which seems stable. He basically has been discharged from care. He really is doing fine from a cardiac standpoint without significant issues of chest tightness or pressure with rest or exertion. He does have baseline dyspnea which is worse with exertion. He has not had any heart failure symptoms of PND, orthopnea or edema.    No palpitations, lightheadedness, dizziness, weakness or syncope/near syncope. No TIA/amaurosis fugax symptoms. No melena, hematochezia, hematuria, or epstaxis. No claudication.  ROS: A comprehensive was performed. ROS   Past Medical History:  Diagnosis Date  . Allergic rhinitis   . Anxiety   . Basal cell carcinoma of face   . Cardiomyopathy ([place]) 04/2015   EF 40-45% by  echo. Apical anterior akinesis seen on echo, not confirmed on Myoview was negative for ischemia. Myoview suggested a small basal inferoseptal defect.  . Cholecystitis    a. complex admit 04/2015 due to sepsis due to  acute Escherichia coli cholecystitis and right lower lobe CAP and Citrobacter UTI.  [name] Chronic respiratory failure ([place])    a. on home O2 after discharge 04/2015.  [name] Chronic systolic CHF (congestive heart failure) ([place])    a. Dx 04/2015 during complex admission for cholecystitis - EF 40-45% +WMA.  [name] COPD (chronic obstructive pulmonary disease) ([place])   . Erectile dysfunction   . History of endovascular stent graft for abdominal aortic aneurysm 2002   a. s/p repair  Followed by Dr. Donnetta Hutching (stable evaluation 01/2106)  . History of hiatal hernia   . HTN (hypertension)   . Hyperlipidemia   . Interstitial lung disease ([place])    a. followed by pulm.  [name] LBBB (left bundle branch block)    a. Intermittent - seen in 2013, not in 09/2013, but again in 04/2015.  . Obesity   . On home oxygen therapy    "just at night" (10/05/2015)  . PVD (peripheral vascular disease) ([place])   . Shingles   . Stroke [facility])    a. H/o aphasia - was told he'd had ministrokes. Imaging 09/2014 revealed prior infarct.    Past Surgical History:  Procedure Laterality Date  . AORTA SURGERY  2002  . CHOLECYSTECTOMY N/A 10/05/2015   Procedure: LAPAROSCOPIC CHOLECYSTECTOMY  ;  Surgeon: Autumn Messing III, MD;  Location: [facility];  Service: General;  Laterality: N/A;  . [place]  . INTRAOPERATIVE CHOLANGIOGRAM  10/05/2015   Procedure: INTRAOPERATIVE CHOLANGIOGRAM;  Surgeon: Autumn Messing III, MD;  Location: [facility];  Service: General;;  . LAPAROSCOPIC CHOLECYSTECTOMY  10/05/2015  . NM MYOVIEW LTD  05/2015   EF 49%. Small size, mild severity perfusion defect in the basal-mid inferoseptal wall. LOW RISK. No ischemia. Defect thought to be related to LBBB artifacts and not true lesion. This could also explain mild systolic dysfunction.  . TRANSTHORACIC ECHOCARDIOGRAM  04/2015   (In setting of acute illness).EF 40-45% with mid-apical anteroseptal akinesis. Moderately increased PA pressures of roughly 48 mmHg.  [name] URETHRAL STRICTURE DILATATION   late 1970s   urinary tract stretch    Prior to Admission medications   Medication Sig Start Date End Date Taking? Authorizing Provider  albuterol (PROVENTIL HFA;VENTOLIN HFA) 108 (90 BASE) MCG/ACT inhaler Inhale 2 puffs into the lungs every 6 (six) hours as needed for wheezing or shortness of breath. 05/05/15  Yes Janece Canterbury, MD  carvedilol (COREG) 12.5 MG tablet TAKE 1 TABLET (12.5 MG TOTAL) BY MOUTH 2 (TWO) TIMES DAILY WITH A MEAL. 11/30/15  Yes Leonie Man, MD  Cholecalciferol (VITAMIN D PO) Take 1 tablet by mouth daily.   Yes Historical Provider, MD  clopidogrel (PLAVIX) 75 MG tablet Take 75 mg by mouth daily.  09/24/14  Yes Historical Provider, MD  ezetimibe (ZETIA) 10 MG tablet Take 10 mg by mouth daily.    Yes Historical Provider, MD  losartan (COZAAR) 50 MG tablet Take 50 mg by mouth daily.   Yes Historical Provider, MD  Multiple Vitamin (MULTIVITAMIN WITH MINERALS) TABS tablet Take 1 tablet by mouth daily.   Yes Historical Provider, MD  nitroGLYCERIN (NITROSTAT) 0.4 MG SL tablet Place 1 tablet (0.4 mg total) under the tongue every 5 (five) minutes as needed for chest pain. 05/05/15  Yes Janece Canterbury, MD  pantoprazole (PROTONIX) 40 MG tablet Take 40 mg by mouth daily. 09/25/14  Yes Historical Provider, MD  Pitavastatin Calcium (LIVALO) 2 MG TABS Take 2 mg by mouth See admin instructions. Every 3rd day   Yes Historical Provider, MD  Probiotic Product ([place]) CAPS Take 1 capsule by mouth daily.   Yes Historical Provider, MD  tiotropium (SPIRIVA) 18 MCG inhalation capsule Place 1 capsule (18 mcg total) into inhaler and inhale daily. 05/05/15  Yes Janece Canterbury, MD   Allergies  Allergen Reactions  . Ciprofloxacin Other (See Comments)    Tingle feeling throughout body  . Tape Other (See Comments)    Redness, Please use "paper" tape     Social History   Social History  . Marital status: Married    Spouse name: N/A  . Number of children: N/A  . Years of  education: N/A   Occupational History  . retired    Social History Main Topics  . Smoking status: Former Smoker    Packs/day: 1.00    Years: 25.00    Types: Cigarettes    Quit date: 08/22/1974  . Smokeless tobacco: Never Used  . Alcohol use No  . Drug use: No  . Sexual activity: Not Asked   Other Topics Concern  . None   Social History Narrative  . None   family history includes Colon cancer in his brother; Heart disease in his father; Hypertension in his mother; Stroke in his father.   Wt Readings from Last 3 Encounters:  03/10/16 198 lb 9.6 oz (90.1 kg)  02/22/16 199 lb (90.3 kg)  11/11/15 191 lb 3.2 oz (86.7 kg)     PHYSICAL EXAM BP (!) 144/76   Pulse 74   Ht 5\' 6"  (1.676 m)   Wt 198 lb 9.6 oz (90.1 kg)   BMI 32.05 kg/m   General appearance: alert, cooperative, appears stated age, no distress and Mildly obese. Pleasant, normal mood and affect.  Neck: no adenopathy, no carotid bruit and no JVD Lungs: clear to auscultation bilaterally, normal percussion bilaterally and non-labored Heart: regular rate and rhythm, S1, S2 (possibly physiologically split) normal, no murmur, click, rub or gallop; nondisplaced PMI. Abdomen: soft, non-tender; bowel sounds normal; no masses,  no organomegaly; no HJR. Report scar well-healed. Extremities: extremities normal, no clubbing, cyanosis or edema Pulses: 2+ and symmetric;  Skin: normal, mobility and turgor normal and Warm and dry or  Neurologic: Mental status: Alert, oriented, thought content appropriate Cranial nerves: normal (II-XII grossly intact)    Adult ECG Report Not checked    Other studies Reviewed: Additional studies/ records that were reviewed today include:  Recent Labs:   labs are followed by PCP - is due to be seen in September.   ASSESSMENT / PLAN: He also need preop assessment for cataract surgery. He is following unstable for very low risk surgery. Should be low risk without any complications. No active  heart failure symptoms and is on a good medication regimen. Okay to hold Plavix if necessary for surgery.  Problem List Items Addressed This Visit    LV dysfunction    No heart failure symptoms. On stable regimen. May still be related to acute illness.      HLD (hyperlipidemia)    On  Livalo. Monitored by PCP. No myalgias.      H/O abdominal aortic aneurysm repair    Followed by Dr. Donavan Burnet from vascular surgery. As far as I can tell based on last study did the  patient may be released.      Essential hypertension (Chronic)    Blood pressures little elevated today. Otherwise stable. Continue current medications for now.      Demand ischemia (HCC)    Overall stable heart failure symptoms. At most Class 1-2. On stable dose of carvedilol and Cozaar. Not requiring diuretic.      Cardiomyopathy, nonischemic (HCC) - EF 40-45% by echo, but nearly 50% by Myoview. Anterior akinesis seen on echo not seen on Myoview. - Primary (Chronic)   Benign hypertensive heart disease without heart failure    Mildly elevated blood pressure today, we will continue to monitor. He is on losartan and carvedilol. Not requiring diuretic.       Other Visit Diagnoses   None.     Current medicines are reviewed at length with the patient today. (+/- concerns) None The following changes have been made: None  Studies Ordered:   No orders of the defined types were placed in this encounter.  ROV 1 yr or sooner dizziness.    Glenetta Hew, M.D., M.S. Interventional Cardiologist   Pager # [phone] Phone # [phone] [address] [place], [place] [address]

## 2016-03-10 NOTE — Patient Instructions (Addendum)
Your physician wants you to follow-up in: 1 year or sooner if needed.You will receive a reminder letter in the mail two months in advance. If you don't receive a letter, please call our office to schedule the follow-up appointment.  If you need a refill on your cardiac medications before your next appointment, please call your pharmacy.  You have been cleared to have the planned cataract surgery.

## 2016-03-13 ENCOUNTER — Encounter: Payer: Self-pay | Admitting: Cardiology

## 2016-03-13 NOTE — Assessment & Plan Note (Signed)
Followed by Dr. Donavan Burnet from vascular surgery. As far as I can tell based on last study did the patient may be released.

## 2016-03-13 NOTE — Assessment & Plan Note (Signed)
Overall stable heart failure symptoms. At most Class 1-2. On stable dose of carvedilol and Cozaar. Not requiring diuretic.

## 2016-03-13 NOTE — Assessment & Plan Note (Signed)
No heart failure symptoms. On stable regimen. May still be related to acute illness.

## 2016-03-13 NOTE — Assessment & Plan Note (Addendum)
Mildly elevated blood pressure today, we will continue to monitor. He is on losartan and carvedilol. Not requiring diuretic.

## 2016-03-13 NOTE — Assessment & Plan Note (Signed)
On  Livalo. Monitored by PCP. No myalgias.

## 2016-03-13 NOTE — Assessment & Plan Note (Signed)
Blood pressures little elevated today. Otherwise stable. Continue current medications for now.

## 2016-07-25 ENCOUNTER — Encounter: Payer: Self-pay | Admitting: Internal Medicine

## 2016-07-25 ENCOUNTER — Ambulatory Visit (INDEPENDENT_AMBULATORY_CARE_PROVIDER_SITE_OTHER): Payer: Medicare Other | Admitting: Internal Medicine

## 2016-07-25 VITALS — BP 138/62 | HR 69 | Ht 66.0 in | Wt 200.8 lb

## 2016-07-25 DIAGNOSIS — J84112 Idiopathic pulmonary fibrosis: Secondary | ICD-10-CM

## 2016-07-25 DIAGNOSIS — J8410 Pulmonary fibrosis, unspecified: Secondary | ICD-10-CM | POA: Diagnosis not present

## 2016-07-25 DIAGNOSIS — J439 Emphysema, unspecified: Secondary | ICD-10-CM | POA: Diagnosis not present

## 2016-07-25 DIAGNOSIS — J438 Other emphysema: Secondary | ICD-10-CM | POA: Diagnosis not present

## 2016-07-25 NOTE — Patient Instructions (Addendum)
ICD-9-CM ICD-10-CM   1. Pulmonary emphysema with fibrosis of lung (HCC) 492.8 J43.9    515 J84.10   2. IPF (idiopathic pulmonary fibrosis) (HCC) 516.31 J84.112   3. Other emphysema (HCC) 492.8 J43.8       - stable disease - light yellow phlegm is  new baseline since early 2017 - continue inhaler spiriva scheduled  and o2 at betime - no known problems of exelon patch interfering with lungs to best of my current knowledge  Followup  6 months or sooner

## 2016-07-25 NOTE — Progress Notes (Signed)
Subjective:     Patient ID: Lee [name], male   DOB: 08-Oct-1930, 82 y.o.   MRN: [id number]  HPI   OV 07/25/2016  Chief Complaint  Patient presents with  . Follow-up    pt states his breathing is unchanged since last OV. Pt c/o prod cough with light yellow mucus - unchanged since last OV. Pt denies CP./tightness.     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 In the interim he's had his cataract surgery uneventfully. He does not like to take medications. In the past he has decline anti-fibrotic therapy. He presents with his wife as usual. Overall pulmonary health is stable. It appears that he has some memory issues and is going to be started on exelon Patch. He uses nocturnal 02. He is on spiriva though not on MAR. Wife says he is compliant with it. He is wondring of about portable o2 for travel . Walking desat test 07/25/16 - 185 feet x 3 laps on RA: slow walk and fatigued. Lowest pulse ox 94% on forehead prlbe  Upteodate with flu shot +     has a past medical history of Allergic rhinitis; Anxiety; Basal cell carcinoma of face; Cardiomyopathy ([place]) (04/2015); Cholecystitis; Chronic respiratory failure ([place]); Chronic systolic CHF (congestive heart failure) (HCC); COPD (chronic obstructive pulmonary disease) ([place]); Erectile dysfunction; History of endovascular stent graft for abdominal aortic aneurysm (2002); History of hiatal hernia; HTN (hypertension); Hyperlipidemia; Interstitial lung disease ([place]); LBBB (left bundle branch block); Obesity; On home oxygen therapy; PVD (peripheral vascular disease) ([place]); Shingles; and Stroke ([place]).   reports that he quit smoking about 41 years ago. His smoking use included Cigarettes. He has a 25.00 pack-year smoking history. He has never used smokeless tobacco.  Past Surgical History:  Procedure Laterality Date  . AORTA SURGERY  2002  . CHOLECYSTECTOMY N/A 10/05/2015   Procedure: LAPAROSCOPIC CHOLECYSTECTOMY  ;  Surgeon: Autumn Messing III, MD;  Location: [facility];  Service: General;  Laterality: N/A;  . [place]  . INTRAOPERATIVE CHOLANGIOGRAM  10/05/2015   Procedure: INTRAOPERATIVE CHOLANGIOGRAM;  Surgeon: Autumn Messing III, MD;  Location: [facility];  Service: General;;  . LAPAROSCOPIC CHOLECYSTECTOMY  10/05/2015  . NM MYOVIEW LTD  05/2015   EF 49%. Small size, mild severity perfusion defect in the basal-mid inferoseptal wall. LOW RISK. No ischemia. Defect thought to be related to LBBB artifacts and not true lesion. This could also explain mild systolic dysfunction.  . TRANSTHORACIC ECHOCARDIOGRAM  04/2015   (In setting of acute illness).EF 40-45% with mid-apical anteroseptal akinesis. Moderately increased PA pressures of roughly 48 mmHg.  [name] URETHRAL STRICTURE DILATATION  late 1970s   urinary tract stretch    Allergies  Allergen Reactions  . Ciprofloxacin Other (See Comments)    Tingle feeling throughout body  . Tape Other (See Comments)    Redness, Please use "paper" tape    Immunization History  Administered Date(s) Administered  . Influenza Split 06/22/2012, 06/22/2013, 06/03/2015  . Influenza, High Dose Seasonal PF 04/22/2016  . Influenza-Unspecified 06/22/2014  . Pneumococcal Polysaccharide-23 06/22/2010  . Tdap 09/22/2012    Family History  Problem Relation Age of Onset  . Hypertension Mother   . Stroke Father   . Heart disease Father   . Colon cancer Brother      Current Outpatient Prescriptions:  .  albuterol (PROVENTIL HFA;VENTOLIN HFA) 108 (90 BASE) MCG/ACT inhaler, Inhale 2 puffs into the lungs  every 6 (six) hours as needed for wheezing or shortness of breath., Disp: 1 Inhaler, Rfl: 0 .  carvedilol (COREG) 12.5 MG tablet, TAKE 1 TABLET (12.5 MG TOTAL) BY MOUTH 2 (TWO) TIMES DAILY WITH A MEAL., Disp: 180 tablet, Rfl: 2 .  Cholecalciferol (VITAMIN D PO), Take 1 tablet by mouth daily., Disp: , Rfl:  .  clopidogrel (PLAVIX) 75 MG  tablet, Take 75 mg by mouth daily. , Disp: , Rfl: 6 .  ezetimibe (ZETIA) 10 MG tablet, Take 10 mg by mouth daily. , Disp: , Rfl:  .  losartan (COZAAR) 50 MG tablet, Take 50 mg by mouth daily., Disp: , Rfl:  .  Multiple Vitamin (MULTIVITAMIN WITH MINERALS) TABS tablet, Take 1 tablet by mouth daily., Disp: , Rfl:  .  nitroGLYCERIN (NITROSTAT) 0.4 MG SL tablet, Place 1 tablet (0.4 mg total) under the tongue every 5 (five) minutes as needed for chest pain., Disp: 30 tablet, Rfl: 0 .  pantoprazole (PROTONIX) 40 MG tablet, Take 40 mg by mouth daily., Disp: , Rfl: 11 .  Pitavastatin Calcium (LIVALO) 2 MG TABS, Take 2 mg by mouth See admin instructions. Every 3rd day, Disp: , Rfl:  .  Probiotic Product ([place]) CAPS, Take 1 capsule by mouth daily., Disp: , Rfl:  .  tiotropium (SPIRIVA) 18 MCG inhalation capsule, Place 1 capsule (18 mcg total) into inhaler and inhale daily., Disp: 30 capsule, Rfl: 0   Review of Systems     Objective:   Physical Exam  Constitutional: He is oriented to person, place, and time. He appears well-developed and well-nourished. No distress.  HENT:  Head: Normocephalic and atraumatic.  Right Ear: External ear normal.  Left Ear: External ear normal.  Mouth/Throat: Oropharynx is clear and moist. No oropharyngeal exudate.  Eyes: Conjunctivae and EOM are normal. Pupils are equal, round, and reactive to light. Right eye exhibits no discharge. Left eye exhibits no discharge. No scleral icterus.  Neck: Normal range of motion. Neck supple. No JVD present. No tracheal deviation present. No thyromegaly present.  Cardiovascular: Normal rate, regular rhythm and intact distal pulses.  Exam reveals no gallop and no friction rub.   No murmur heard. Pulmonary/Chest: Effort normal. No respiratory distress. He has no wheezes. He has rales. He exhibits no tenderness.  Rales at base  Abdominal: Soft. Bowel sounds are normal. He exhibits no distension and no mass. There is no  tenderness. There is no rebound and no guarding.  Visceral obesity  Musculoskeletal: Normal range of motion. He exhibits no edema or tenderness.  Slow gait with cane  Lymphadenopathy:    He has no cervical adenopathy.  Neurological: He is alert and oriented to person, place, and time. He has normal reflexes. No cranial nerve deficit. Coordination normal.  Skin: Skin is warm and dry. No rash noted. He is not diaphoretic. No erythema. No pallor.  Psychiatric: He has a normal mood and affect. His behavior is normal. Judgment and thought content normal.  Nursing note and vitals reviewed.    Vitals:   07/25/16 1221  BP: 138/62  Pulse: 69  SpO2: 95%  Weight: 200 lb 12.8 oz (91.1 kg)  Height: 5\' 6"  (1.676 m)    Estimated body mass index is 32.41 kg/m as calculated from the following:   Height as of this encounter: 5\' 6"  (1.676 m).   Weight as of this encounter: 200 lb 12.8 oz (91.1 kg).      Assessment:       ICD-9-CM  ICD-10-CM   1. Pulmonary emphysema with fibrosis of lung (HCC) 492.8 J43.9    515 J84.10   2. IPF (idiopathic pulmonary fibrosis) (HCC) 516.31 J84.112   3. Other emphysema (HCC) 492.8 J43.8        Plan:       - stable disease - light yellow phlegm is  new baseline since early 2017 - continue inhales and o2 at betime - walk test on room air 07/25/2016 to see if you qualify for portable o2 - no known problems of exelon patch interfering with lungs to best of my current knowledge  Followup  6 months or sooner   Dr. Brand Males, M.D., [facility].C.P Pulmonary and Critical Care Medicine Staff Physician [place] [facility] Pager: [phone], If no answer or between  15:00h - 7:00h: call [phone]  07/25/2016 12:47 PM

## 2016-08-28 ENCOUNTER — Other Ambulatory Visit: Payer: Self-pay | Admitting: Cardiology

## 2016-09-01 ENCOUNTER — Telehealth: Payer: Self-pay | Admitting: Cardiology

## 2016-09-01 NOTE — Telephone Encounter (Signed)
°  New Prob   Calling to inquire about Exalon medication which was prescribed by PCP. Please call.

## 2016-09-01 NOTE — Telephone Encounter (Signed)
Spoke to wife ,  She states the she was peaking to a pharmacist with [facility]- Going over patient's medications ( interaction.)  Primary prescribed  Transdermal patch - EXALON BRAND -( generic  Rivastagmine).  ( help with memory)Patient has not started taking yet per wife.  [facility] pharmacist instructed  Wife to contact cardiology  - the new medication may slow heart rate.  since patient is taking carvedilol as well.   RN informed wife will defer to DR Otto [facility] and [facility] pharmacist to review and make a comment and suggestion and contact wife /patient.

## 2016-09-02 NOTE — Telephone Encounter (Signed)
Information given to wife . She prefer to wait and see what Dr Woody Seller in regards to medication. Aware Dr Ellyn Hack will be in the office next week.

## 2016-09-02 NOTE — Telephone Encounter (Signed)
l recommend against use of exelon together with carvedilol.  If patient unable to tolerate alternative therapy to exelon;  okay to use but need to monitor BP and HR  twice daily at home with follow up appointment within 2 weeks for possible carvedilol titration.

## 2016-09-06 NOTE — Telephone Encounter (Signed)
I actually agree with this recommendation from our pharmacy team. If we need to start medication, I would see if we can have him follow-up here in CVRR ~2 weeks after starting meds. Would also reduce Carvedilol to 6.25 mg BID Need to make sure that he can have BP / HR checked @ home.  Glenetta Hew, MD

## 2016-09-14 NOTE — Telephone Encounter (Signed)
Spoke with wife, information given in regards to recommendation of taking both Exelon and Carvedilol. Will need to reduce dose of carvedilol to 6.25mg  twice a day.   wife states she will discuss with patient to see if he wants to start the Exelon or not.  if patient start medication ,aware to keep a log of blood pressure and heat rate twice a day for 2 weeks and call and make an appointment to [place].  Wife states she does not need new prescription for reduce dose at present time- patient has received a 90 day supply of carvedilol.

## 2017-01-23 ENCOUNTER — Encounter: Payer: Self-pay | Admitting: Internal Medicine

## 2017-01-23 ENCOUNTER — Ambulatory Visit (INDEPENDENT_AMBULATORY_CARE_PROVIDER_SITE_OTHER): Payer: Medicare Other | Admitting: Internal Medicine

## 2017-01-23 VITALS — BP 122/60 | HR 85 | Ht 66.0 in | Wt 207.0 lb

## 2017-01-23 DIAGNOSIS — J439 Emphysema, unspecified: Secondary | ICD-10-CM | POA: Diagnosis not present

## 2017-01-23 DIAGNOSIS — J440 Chronic obstructive pulmonary disease with acute lower respiratory infection: Secondary | ICD-10-CM | POA: Diagnosis not present

## 2017-01-23 DIAGNOSIS — J8410 Pulmonary fibrosis, unspecified: Secondary | ICD-10-CM

## 2017-01-23 DIAGNOSIS — J209 Acute bronchitis, unspecified: Secondary | ICD-10-CM

## 2017-01-23 MED ORDER — CEPHALEXIN 500 MG PO CAPS: 500.0000 mg | ORAL_CAPSULE | Freq: Three times a day (TID) | ORAL | 0 refills | Status: DC

## 2017-01-23 NOTE — Patient Instructions (Signed)
ICD-9-CM ICD-10-CM   1. Pulmonary emphysema with fibrosis of lung (HCC) 492.8 J43.9    515 J84.10   2. Acute bronchitis with COPD ([place]) 491.22 J44.0     J20.9     cephalexin 500mg  three times daily x  5 days Change spiriva to anoro - call back in 2-4 weeks with response Flu shot in fall  followup 6 mnths or sooner if needed

## 2017-01-23 NOTE — Progress Notes (Signed)
Subjective:     Patient ID: Lee [name], male   DOB: May 20, 1931, 83 y.o.   MRN: [id number]  HPI  OV 07/25/2016  Chief Complaint  Patient presents with  . Follow-up    pt states his breathing is unchanged since last OV. Pt c/o prod cough with light yellow mucus - unchanged since last OV. Pt denies CP./tightness.     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 In the interim he's had his cataract surgery uneventfully. He does not like to take medications. In the past he has decline anti-fibrotic therapy. He presents with his wife as usual. Overall pulmonary health is stable. It appears that he has some memory issues and is going to be started on exelon Patch. He uses nocturnal 02. He is on spiriva though not on MAR. Wife says he is compliant with it. He is wondring of about portable o2 for travel . Walking desat test 07/25/16 - 185 feet x 3 laps on RA: slow walk and fatigued. Lowest pulse ox 94% on forehead prlbe  Upteodate with flu shot +    OV 01/23/2017  Chief Complaint  Patient presents with  . Follow-up    Pt states his breathing is unchanged since last OV. Pt c/o prod cough with light yellow mucus. Pt denies CP/tightness and f/c/s.     follow-up combined emphysema with intestitial lung disease initial diagnosis of IPF   this is a six-month follow-up. He is not on antibiotic terapy. He is only on Spiriva. His wife reports for the last 1 month is increased chest tightness and congestion with increased yellow sputum. But he is denying any problems. This no wheezing. Both of them agree that there is no increased wheezing. They're tryingto make him walk a little bit more than usual.    has a past medical history of Allergic rhinitis; Anxiety; Basal cell carcinoma of face; Cardiomyopathy ([place]) (04/2015); Cholecystitis; Chronic respiratory failure ([place]); Chronic systolic CHF (congestive heart failure) (HCC); COPD  (chronic obstructive pulmonary disease) ([place]); Erectile dysfunction; History of endovascular stent graft for abdominal aortic aneurysm (2002); History of hiatal hernia; HTN (hypertension); Hyperlipidemia; Interstitial lung disease ([place]); LBBB (left bundle branch block); Obesity; On home oxygen therapy; PVD (peripheral vascular disease) ([place]); Shingles; and Stroke ([place]).   reports that he quit smoking about 42 years ago. His smoking use included Cigarettes. He has a 25.00 pack-year smoking history. He has never used smokeless tobacco.  Past Surgical History:  Procedure Laterality Date  . AORTA SURGERY  2002  . CHOLECYSTECTOMY N/A 10/05/2015   Procedure: LAPAROSCOPIC CHOLECYSTECTOMY  ;  Surgeon: Autumn Messing III, MD;  Location: [facility];  Service: General;  Laterality: N/A;  . [place]  . INTRAOPERATIVE CHOLANGIOGRAM  10/05/2015   Procedure: INTRAOPERATIVE CHOLANGIOGRAM;  Surgeon: Autumn Messing III, MD;  Location: [facility];  Service: General;;  . LAPAROSCOPIC CHOLECYSTECTOMY  10/05/2015  . NM MYOVIEW LTD  05/2015   EF 49%. Small size, mild severity perfusion defect in the basal-mid inferoseptal wall. LOW RISK. No ischemia. Defect thought to be related to LBBB artifacts and not true lesion. This could also explain mild systolic dysfunction.  . TRANSTHORACIC ECHOCARDIOGRAM  04/2015   (In setting of acute illness).EF 40-45% with mid-apical anteroseptal akinesis. Moderately increased PA pressures of roughly 48 mmHg.  [name] URETHRAL STRICTURE DILATATION  late 1970s   urinary tract stretch    Allergies  Allergen Reactions  .  Ciprofloxacin Other (See Comments)    Tingle feeling throughout body  . Tape Other (See Comments)    Redness, Please use "paper" tape    Immunization History  Administered Date(s) Administered  . Influenza Split 06/22/2012, 06/22/2013, 06/03/2015  . Influenza, High Dose Seasonal PF 04/22/2016  . Influenza-Unspecified 06/22/2014  . Pneumococcal Polysaccharide-23 06/22/2010  .  Tdap 09/22/2012    Family History  Problem Relation Age of Onset  . Hypertension Mother   . Stroke Father   . Heart disease Father   . Colon cancer Brother      Current Outpatient Prescriptions:  .  albuterol (PROVENTIL HFA;VENTOLIN HFA) 108 (90 BASE) MCG/ACT inhaler, Inhale 2 puffs into the lungs every 6 (six) hours as needed for wheezing or shortness of breath., Disp: 1 Inhaler, Rfl: 0 .  carvedilol (COREG) 12.5 MG tablet, TAKE 1 TABLET (12.5 MG TOTAL) BY MOUTH 2 (TWO) TIMES DAILY WITH A MEAL., Disp: 180 tablet, Rfl: 3 .  Cholecalciferol (VITAMIN D PO), Take 1 tablet by mouth daily., Disp: , Rfl:  .  clopidogrel (PLAVIX) 75 MG tablet, Take 75 mg by mouth daily. , Disp: , Rfl: 6 .  ezetimibe (ZETIA) 10 MG tablet, Take 10 mg by mouth daily. , Disp: , Rfl:  .  losartan (COZAAR) 50 MG tablet, Take 50 mg by mouth daily., Disp: , Rfl:  .  Multiple Vitamin (MULTIVITAMIN WITH MINERALS) TABS tablet, Take 1 tablet by mouth daily., Disp: , Rfl:  .  nitroGLYCERIN (NITROSTAT) 0.4 MG SL tablet, Place 1 tablet (0.4 mg total) under the tongue every 5 (five) minutes as needed for chest pain., Disp: 30 tablet, Rfl: 0 .  pantoprazole (PROTONIX) 40 MG tablet, Take 40 mg by mouth daily., Disp: , Rfl: 11 .  Pitavastatin Calcium (LIVALO) 2 MG TABS, Take 2 mg by mouth See admin instructions. Every 3rd day, Disp: , Rfl:  .  Probiotic Product ([place]) CAPS, Take 1 capsule by mouth daily., Disp: , Rfl:  .  tiotropium (SPIRIVA) 18 MCG inhalation capsule, Place 1 capsule (18 mcg total) into inhaler and inhale daily., Disp: 30 capsule, Rfl: 0   Review of Systems     Objective:   Physical Exam  Constitutional: He is oriented to person, place, and time. He appears well-developed and well-nourished. No distress.  HENT:  Head: Normocephalic and atraumatic.  Right Ear: External ear normal.  Left Ear: External ear normal.  Mouth/Throat: Oropharynx is clear and moist. No oropharyngeal exudate.   Eyes: Conjunctivae and EOM are normal. Pupils are equal, round, and reactive to light. Right eye exhibits no discharge. Left eye exhibits no discharge. No scleral icterus.  Neck: Normal range of motion. Neck supple. No JVD present. No tracheal deviation present. No thyromegaly present.  Cardiovascular: Normal rate, regular rhythm and intact distal pulses.  Exam reveals no gallop and no friction rub.   No murmur heard. Pulmonary/Chest: Effort normal. No respiratory distress. He has no wheezes. He has rales. He exhibits no tenderness.  Abdominal: Soft. Bowel sounds are normal. He exhibits no distension and no mass. There is no tenderness. There is no rebound and no guarding.  Visceral obesity +  Musculoskeletal: Normal range of motion. He exhibits no edema or tenderness.  Uses cane  Lymphadenopathy:    He has no cervical adenopathy.  Neurological: He is alert and oriented to person, place, and time. He has normal reflexes. No cranial nerve deficit. Coordination normal.  Skin: Skin is warm and dry. No rash noted.  He is not diaphoretic. No erythema. No pallor.  Psychiatric: He has a normal mood and affect. His behavior is normal. Judgment and thought content normal.  Nursing note and vitals reviewed.  Vitals:   01/23/17 1338  BP: 122/60  Pulse: 85  SpO2: 94%  Weight: 207 lb (93.9 kg)  Height: 5\' 6"  (1.676 m)    Estimated body mass index is 33.41 kg/m as calculated from the following:   Height as of this encounter: 5\' 6"  (1.676 m).   Weight as of this encounter: 207 lb (93.9 kg).     Assessment:       ICD-9-CM ICD-10-CM   1. Pulmonary emphysema with fibrosis of lung (HCC) 492.8 J43.9    515 J84.10   2. Acute bronchitis with COPD ([place]) 491.22 J44.0     J20.9        Plan:      cephalexin 500mg  three times daily x  5 days Change spiriva to anoro - call back in 2-4 weeks with response Flu shot in fall  followup 6 mnths or sooner if needed   Dr. Brand Males, M.D.,  [facility].C.P Pulmonary and Critical Care Medicine Staff Physician [facility] Pager: [phone], If no answer or between  15:00h - 7:00h: call [phone]  01/23/2017 1:56 PM

## 2017-03-24 ENCOUNTER — Encounter: Payer: Self-pay | Admitting: Cardiology

## 2017-03-24 ENCOUNTER — Ambulatory Visit (INDEPENDENT_AMBULATORY_CARE_PROVIDER_SITE_OTHER): Payer: Medicare Other | Admitting: Cardiology

## 2017-03-24 VITALS — BP 121/69 | HR 91 | Ht 66.0 in | Wt 203.0 lb

## 2017-03-24 DIAGNOSIS — R0609 Other forms of dyspnea: Secondary | ICD-10-CM | POA: Diagnosis not present

## 2017-03-24 DIAGNOSIS — I428 Other cardiomyopathies: Secondary | ICD-10-CM

## 2017-03-24 DIAGNOSIS — E785 Hyperlipidemia, unspecified: Secondary | ICD-10-CM | POA: Diagnosis not present

## 2017-03-24 DIAGNOSIS — I10 Essential (primary) hypertension: Secondary | ICD-10-CM | POA: Diagnosis not present

## 2017-03-24 DIAGNOSIS — I119 Hypertensive heart disease without heart failure: Secondary | ICD-10-CM | POA: Diagnosis not present

## 2017-03-24 NOTE — Progress Notes (Signed)
PCP: [name], MD  Clinic Note: Chief Complaint  Patient presents with  . Follow-up    1 year;  . Cardiomyopathy    Likely related to left bundle branch block    HPI: Lee [name] is a 83 y.o. male with a PMH b elow who presents today for annual follow-up with a history of AAA status post repair in 2002 as well as hyperlipidemia and hypertension with mild cardiomyopathy. He has baseline left bundle branch block. Does have a history of stroke as Plavix. He also has significant COPD and pulmonary fibrosis, chronic respiratory failure followed by Dr Chase Caller (on qhs O2). He is a former patient of Dr. Mare Ferrari. He sees Dr. Donnetta Hutching for vascular surgery follow-up of his AAA. In addition to these diagnoses, he was given a diagnosis of "diastolic heart failure" back in September 2016 but does not technically have chronic symptoms of heart failure. This diagnosis was given in the setting of Escherichia coli sepsis from UTI as well as pneumonia. He had acute hypoxic respiratory failure with COPD exacerbation, located by pulmonary edema/heart failure. His EF by echo was read as 40-45% down from previous 50% (there was akinesis of the mid-apical anteroseptal wall - quite likely related to left bundle branch block - with no infarct or ischemia noted on Myoview as follow-up -> perfusion defect thought to be related to branch block.)  Lee [name] was last seen in July 2017. He was doing fine at that time without any issues of chest tightness or pressure. He does have baseline dyspnea worse with exertion due to his lung disease, but had not had any PND, orthopnea or edema. No rapid/ irregular heartbeats palpitations.  Recent Hospitalizations: None  Studies Personally Reviewed - (if available, images/films reviewed: From Epic Chart or Care Everywhere)  June 2017: Lower extremity duplex/ABIs and abdominal aortic duplex did not show any significant disease.  Interval History: Lee [name] returns today  for routine follow-up really without any major complaints. He didn't still gets tired quite easily, denies any significant change in symptoms. He has some shortness of breath with exertion, but is quite deconditioned. He really doesn't do much the way of any activity. He denies any resting or exertional chest tightness or pressure. He himself is a relatively poor historian and is very difficult getting answers out of him. He has not noted chest pain episodes, but has noted some left lower quadrant pain and is concerned that he possibly may have diverticulitis. On antibiotics recently. No PND, orthopnea with mild lower extremity edema. No rapid irregular heartbeats or palpitations. He does notice PVCs or PACs. No syncope/near-syncope or TIA/amaurosis fugax. He does have some positional dizziness with standing up too fast, but has not actually passed out.  No melena, hematochezia, hematuria, or epstaxis. No claudication.  ROS: A comprehensive was performed. Review of Systems  Unable to perform ROS: Mental acuity  Constitutional: Positive for malaise/fatigue (Tires easily). Negative for weight loss.  HENT: Negative for nosebleeds.   Respiratory: Positive for shortness of breath (No more than usual). Negative for cough.   Gastrointestinal: Positive for abdominal pain (Left lower quadrant pain).  Genitourinary: Positive for frequency. Negative for dysuria.  Musculoskeletal: Positive for myalgias (He does have bilateral leg pains.).  Neurological: Positive for dizziness (If he stands up too quickly) and speech change (He has some stuttering speech).  Psychiatric/Behavioral: Positive for memory loss (Poor historian). Negative for depression.  All other systems reviewed and are negative.  I have reviewed and (if  needed) personally updated the patient's problem list, medications, allergies, past medical and surgical history, social and family history.   Past Medical History:  Diagnosis Date  . Allergic  rhinitis   . Anxiety   . Basal cell carcinoma of face   . Cardiomyopathy ([place]) 04/2015   EF 40-45% by echo. Apical anterior akinesis seen on echo, not confirmed on Myoview was negative for ischemia. Myoview suggested a small basal inferoseptal defect.  . Cholecystitis    a. complex admit 04/2015 due to sepsis due to acute Escherichia coli cholecystitis and right lower lobe CAP and Citrobacter UTI.  [name] Chronic respiratory failure ([place])    a. on home O2 after discharge 04/2015.  [name] Chronic systolic CHF (congestive heart failure) ([place])    a. Dx 04/2015 during complex admission for cholecystitis - EF 40-45% +WMA.  [name] COPD (chronic obstructive pulmonary disease) ([place])   . Erectile dysfunction   . History of endovascular stent graft for abdominal aortic aneurysm 2002   a. s/p repair  Followed by Dr. Donnetta Hutching (stable evaluation 01/2106)  . History of hiatal hernia   . HTN (hypertension)   . Hyperlipidemia   . Interstitial lung disease ([place])    a. followed by pulm.  [name] LBBB (left bundle branch block)    a. Intermittent - seen in 2013, not in 09/2013, but again in 04/2015.  . Obesity   . On home oxygen therapy    "just at night" (10/05/2015)  . PVD (peripheral vascular disease) ([place])   . Shingles   . Stroke [facility])    a. H/o aphasia - was told he'd had ministrokes. Imaging 09/2014 revealed prior infarct.    Past Surgical History:  Procedure Laterality Date  . AORTA SURGERY  2002  . CHOLECYSTECTOMY N/A 10/05/2015   Procedure: LAPAROSCOPIC CHOLECYSTECTOMY  ;  Surgeon: Autumn Messing III, MD;  Location: [facility];  Service: General;  Laterality: N/A;  . [place]  . INTRAOPERATIVE CHOLANGIOGRAM  10/05/2015   Procedure: INTRAOPERATIVE CHOLANGIOGRAM;  Surgeon: Autumn Messing III, MD;  Location: [facility];  Service: General;;  . LAPAROSCOPIC CHOLECYSTECTOMY  10/05/2015  . NM MYOVIEW LTD  05/2015   EF 49%. Small size, mild severity perfusion defect in the basal-mid inferoseptal wall. LOW RISK. No ischemia. Defect  thought to be related to LBBB artifacts and not true lesion. This could also explain mild systolic dysfunction.  . TRANSTHORACIC ECHOCARDIOGRAM  04/2015   (In setting of acute illness).EF 40-45% with mid-apical anteroseptal akinesis. Moderately increased PA pressures of roughly 48 mmHg.  [name] URETHRAL STRICTURE DILATATION  late 1970s   urinary tract stretch    Current Meds  Medication Sig  . albuterol (PROVENTIL HFA;VENTOLIN HFA) 108 (90 BASE) MCG/ACT inhaler Inhale 2 puffs into the lungs every 6 (six) hours as needed for wheezing or shortness of breath.  . carvedilol (COREG) 12.5 MG tablet TAKE 1 TABLET (12.5 MG TOTAL) BY MOUTH 2 (TWO) TIMES DAILY WITH A MEAL.  . cephALEXin (KEFLEX) 500 MG capsule Take 1 capsule (500 mg total) by mouth 3 (three) times daily.  . Cholecalciferol (VITAMIN D PO) Take 1 tablet by mouth daily.  . clopidogrel (PLAVIX) 75 MG tablet Take 75 mg by mouth daily.   [name] ezetimibe (ZETIA) 10 MG tablet Take 10 mg by mouth daily.   [name] losartan (COZAAR) 50 MG tablet Take 50 mg by mouth daily.  . Multiple Vitamin (MULTIVITAMIN WITH MINERALS) TABS tablet Take 1 tablet by mouth daily.  . nitroGLYCERIN (NITROSTAT) 0.4  MG SL tablet Place 1 tablet (0.4 mg total) under the tongue every 5 (five) minutes as needed for chest pain.  . pantoprazole (PROTONIX) 40 MG tablet Take 40 mg by mouth daily.  . Pitavastatin Calcium (LIVALO) 2 MG TABS Take 2 mg by mouth See admin instructions. Every 3rd day  . Probiotic Product ([place]) CAPS Take 1 capsule by mouth daily.  [name] tiotropium (SPIRIVA) 18 MCG inhalation capsule Place 1 capsule (18 mcg total) into inhaler and inhale daily.    Allergies  Allergen Reactions  . Ciprofloxacin Other (See Comments)    Tingle feeling throughout body  . Tape Other (See Comments)    Redness, Please use "paper" tape    Social History   Social History  . Marital status: Married    Spouse name: N/A  . Number of children: N/A  . Years of  education: N/A   Occupational History  . retired    Social History Main Topics  . Smoking status: Former Smoker    Packs/day: 1.00    Years: 25.00    Types: Cigarettes    Quit date: 08/22/1974  . Smokeless tobacco: Never Used  . Alcohol use No  . Drug use: No  . Sexual activity: Not Asked   Other Topics Concern  . None   Social History Narrative  . None    family history includes Colon cancer in his brother; Heart disease in his father; Hypertension in his mother; Stroke in his father.  Wt Readings from Last 3 Encounters:  03/24/17 203 lb (92.1 kg)  01/23/17 207 lb (93.9 kg)  07/25/16 200 lb 12.8 oz (91.1 kg)    PHYSICAL EXAM BP 121/69   Pulse 91   Ht 5\' 6"  (1.676 m)   Wt 203 lb (92.1 kg)   BMI 32.77 kg/m  Physical Exam  Constitutional: He appears well-developed and well-nourished. No distress.  Mildly obese  HENT:  Head: Normocephalic and atraumatic.  Cardiovascular: Normal rate, regular rhythm, S1 normal and intact distal pulses.   Extrasystoles are present. PMI is not displaced.  Exam reveals distant heart sounds. Exam reveals no gallop and no decreased pulses.   No murmur heard. Physiologically split S2  Pulmonary/Chest: Effort normal and breath sounds normal. No respiratory distress. He exhibits no tenderness.  Mild diffuse expiratory wheeze and rhonchi. No rales.  Abdominal: Soft. Bowel sounds are normal. He exhibits no distension. There is no tenderness. There is no rebound.  Obese  Musculoskeletal: He exhibits edema (Trace lower extremity).  Neurological: He is alert.  He is oriented to person and place, but not time and date. He is not able to tell me who the president is. I can't tell if he is joking or simply cannot answer  Skin: Skin is warm and dry. No erythema.  Psychiatric: He has a normal mood and affect.  Somewhat slow stuttered speech. Poor historian. Tends to try to drink his way out of things. His wife has to redirect him.   Nursing note and  vitals reviewed.    Adult ECG Report  Rate: 91 ;  Rhythm: normal sinus rhythm, premature ventricular contractions (PVC) and 1 AVB. Left axis deviation (-46), LBBB.;   Narrative Interpretation: Stable EKG   Other studies Reviewed: Additional studies/ records that were reviewed today include:  Recent Labs:  Followed by PCP.  No results found for: CHOL, HDL, LDLCALC, LDLDIRECT, TRIG, CHOLHDL   ASSESSMENT / PLAN: Overall stable, no plans to change. Try to minimize any potential  invasive evaluations.  Problem List Items Addressed This Visit    Benign hypertensive heart disease without heart failure    Blood pressure looks relatively stable today on carvedilol and losartan. Not requiring diuretic      Relevant Orders   EKG 12-Lead   Cardiomyopathy, nonischemic (HCC) - EF 40-45% by echo, but nearly 50% by Myoview. Anterior akinesis seen on echo not seen on Myoview. - Primary (Chronic)    He really hasn't had much the way of any true heart failure symptoms. He is on stable dose of ARB and carvedilol. No diuretic requirement this time. Although not thought to be related to coronary disease, he is already on Plavix for his AAA and PAD.      Relevant Orders   EKG 12-Lead   Dyspnea on exertion    Probably related to his emphysema and pulmonary fibrosis      Essential hypertension (Chronic)   Hyperlipidemia with target LDL less than 100 (Chronic)    He is on the below. Monitored by PCP. I think this is probably the best statin option to avoid competition with memory loss.         Current medicines are reviewed at length with the patient today. (+/- concerns) None The following changes have been made: None  Patient Instructions  No change with current medications     Your physician wants you to follow-up in 12 months with DR HARDING.You will receive a reminder letter in the mail two months in advance. If you don't receive a letter, please call our office to schedule the  follow-up appointment.    Studies Ordered:   Orders Placed This Encounter  Procedures  . EKG 12-Lead      Glenetta Hew, M.D., M.S. Interventional Cardiologist   Pager # [phone] Phone # [phone] [address] [place], [place] [address]

## 2017-03-24 NOTE — Patient Instructions (Signed)
No change with current medications     Your physician wants you to follow-up in 12 months with DR HARDING.You will receive a reminder letter in the mail two months in advance. If you don't receive a letter, please call our office to schedule the follow-up appointment.

## 2017-03-26 ENCOUNTER — Encounter: Payer: Self-pay | Admitting: Cardiology

## 2017-03-26 NOTE — Assessment & Plan Note (Signed)
He is on the below. Monitored by PCP. I think this is probably the best statin option to avoid competition with memory loss.

## 2017-03-26 NOTE — Assessment & Plan Note (Signed)
Probably related to his emphysema and pulmonary fibrosis

## 2017-03-26 NOTE — Assessment & Plan Note (Addendum)
He really hasn't had much the way of any true heart failure symptoms. He is on stable dose of ARB and carvedilol. No diuretic requirement this time. Although not thought to be related to coronary disease, he is already on Plavix for his AAA and PAD.

## 2017-03-26 NOTE — Assessment & Plan Note (Signed)
Blood pressure looks relatively stable today on carvedilol and losartan. Not requiring diuretic

## 2017-03-27 NOTE — Progress Notes (Signed)
Recent Labs: 03/24/2017 Na+ 139, K+ 4.2, Cl- 109, HCO3- 22, BUN 10, Cr 1.0, Glu 101, Ca2+ 95; AST 21, ALT, 23, AlkP 67   CBC: W 10.2, H/H 14.5/41.2 Plt 292

## 2017-05-01 IMAGING — XA IR EXCHANGE BILARY DRAIN
3 series · 6 of 6 positions shown · non-contrast
Comparison: none

INDICATION: Cholecystitis. Cholecystostomy catheter placement 05/01/2015.
Catheter was recently partially retracted inadvertently.

[Series 1: fl (-) angio · 1 of 1 slices shown (1 of 3)]
[im 1/1]
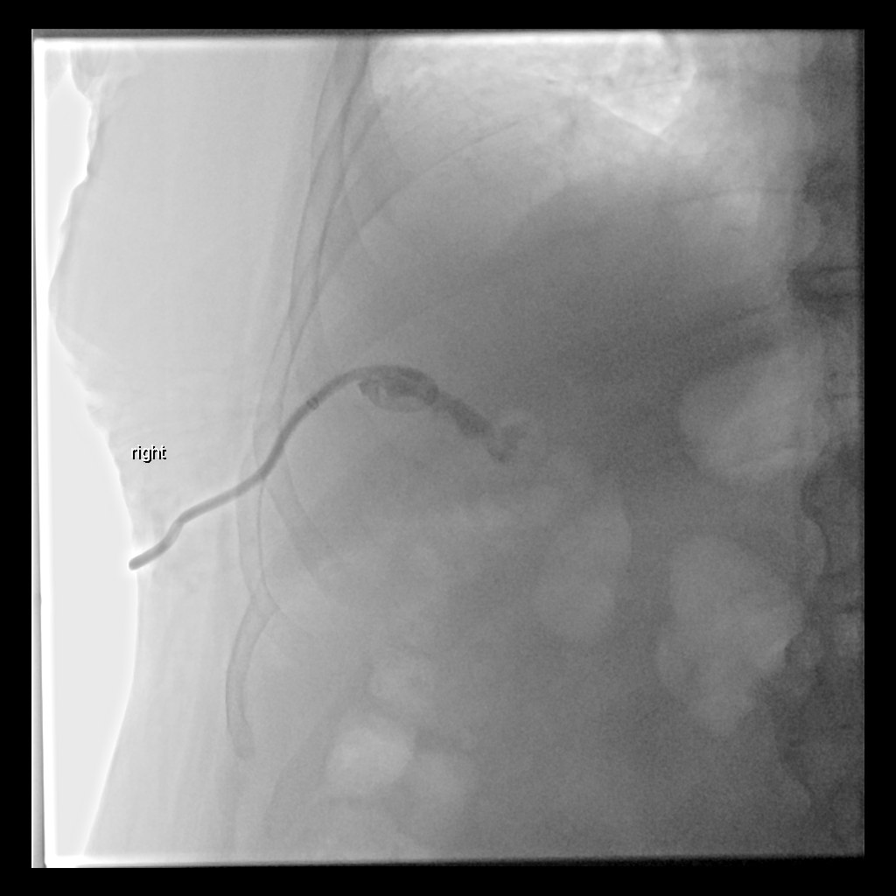

[Series 2: fl (-) angio · 1 of 1 slices shown (2 of 3)]
[im 1/1]
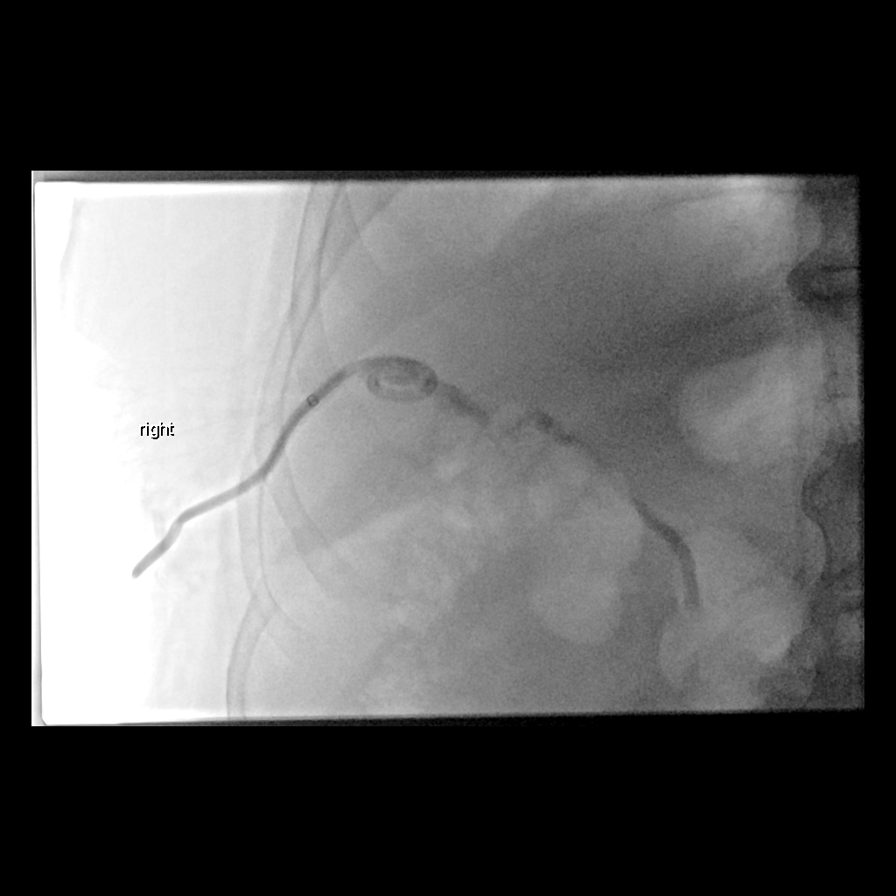

[Series 3: fl (-) angio · 4 of 4 slices shown (3 of 3)]
[im 1/4]
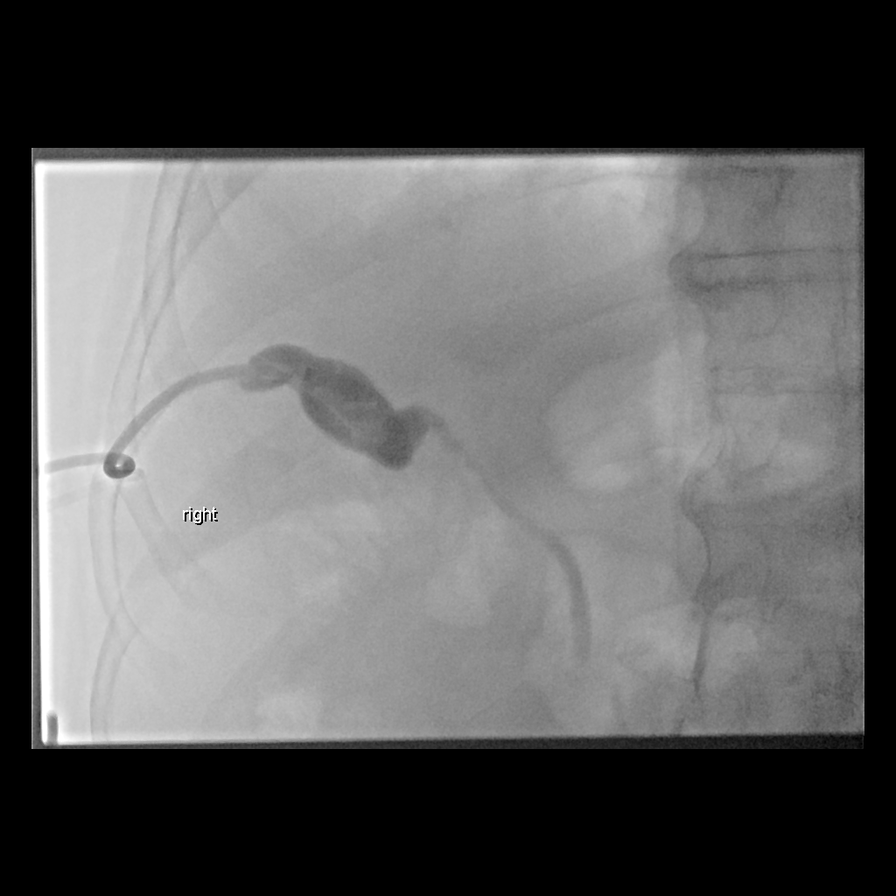
[im 2/4]
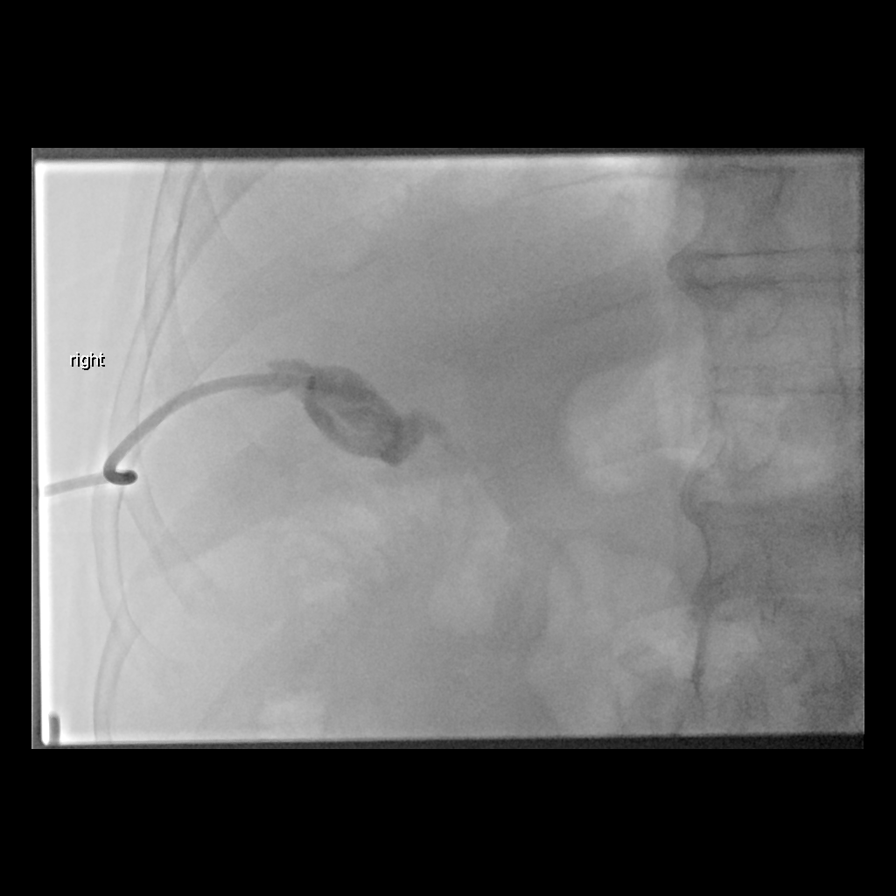
[im 3/4]
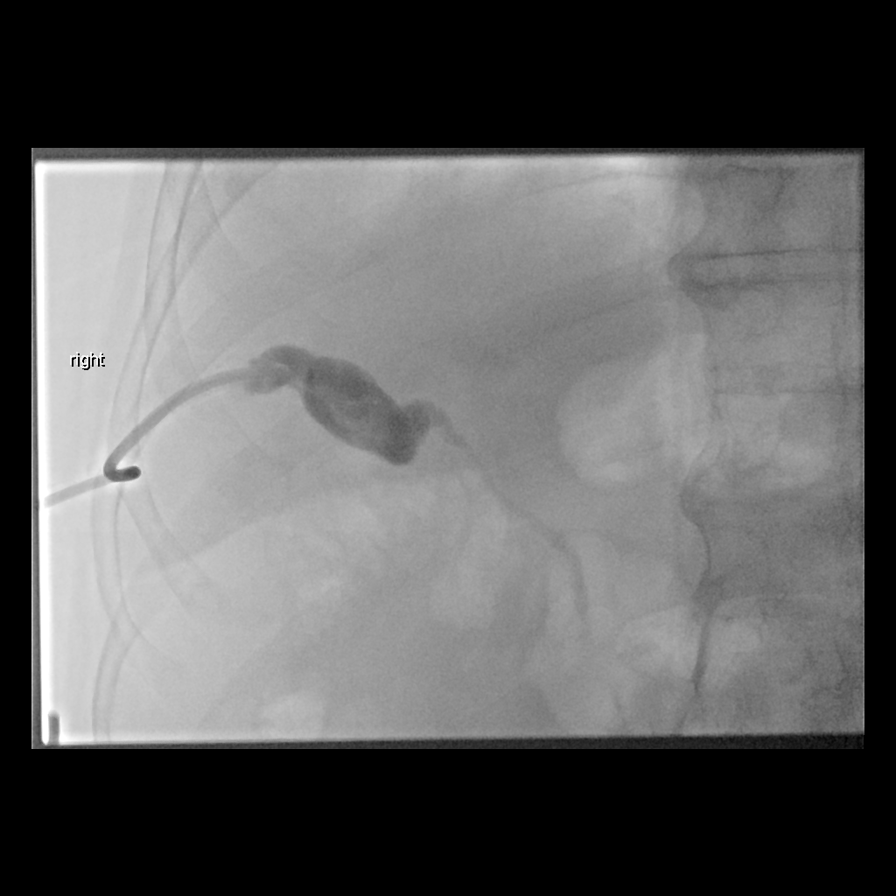
[im 4/4]
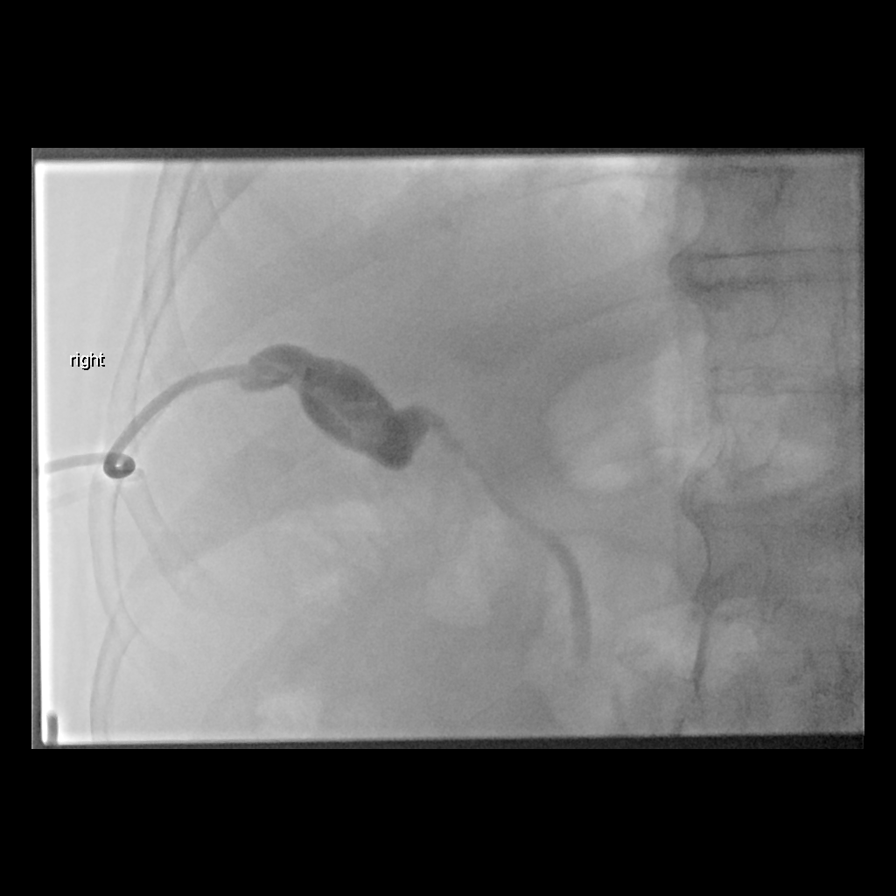

[6 of 6 positions shown; findings below may reference images not displayed]

EXAM:
IR EXCHANGE BILARY DRAIN

MEDICATIONS:
Lidocaine 1% subcutaneous

FLUOROSCOPY TIME:  Fluoroscopy Time: 0 minutes 12 seconds (2 mGy).

COMPLICATIONS:
None immediate.

PROCEDURE:
Informed written consent was obtained from the patient after a
thorough discussion of the procedural risks, benefits and
alternatives. All questions were addressed. Maximal Sterile Barrier
Technique was utilized including caps, mask, sterile gowns, sterile
gloves, sterile drape, hand hygiene and skin antiseptic. A timeout
was performed prior to the initiation of the procedure.

Catheter was injected with contrast, demonstrating that the catheter
had partially withdrawn from the gallbladder. No extravasation of
the contrast with injection however. The gallbladder was
decompressed. Patency of the cystic duct identified.

Catheter and surrounding skin prepped and draped in usual sterile
fashion. Catheter was cut and exchanged over a Bentson wire for a
new 10 French pigtail drain catheter, formed centrally was not lumen
of the gallbladder. Contrast injection confirms appropriate
positioning. Catheter secured externally with 0 Prolene suture and
placed to a gravity drain bag. The patient tolerated the procedure
well.
IMPRESSION: Cholecystostomy injection demonstrated that the catheter had
partially withdrawn from the gallbladder. Technically successful
exchange of cholecystostomy catheter under fluoroscopy.

## 2017-05-29 ENCOUNTER — Encounter (HOSPITAL_COMMUNITY): Payer: Self-pay | Admitting: Emergency Medicine

## 2017-05-29 ENCOUNTER — Other Ambulatory Visit: Payer: Self-pay

## 2017-05-29 ENCOUNTER — Observation Stay (HOSPITAL_COMMUNITY)
Admission: EM | Admit: 2017-05-29 | Discharge: 2017-05-30 | Disposition: A | Discharge status: Home / Self Care | Payer: Medicare Other | Attending: Internal Medicine | Admitting: Internal Medicine

## 2017-05-29 ENCOUNTER — Emergency Department (HOSPITAL_COMMUNITY): Payer: Medicare Other

## 2017-05-29 DIAGNOSIS — I739 Peripheral vascular disease, unspecified: Secondary | ICD-10-CM | POA: Insufficient documentation

## 2017-05-29 DIAGNOSIS — Z7902 Long term (current) use of antithrombotics/antiplatelets: Secondary | ICD-10-CM | POA: Insufficient documentation

## 2017-05-29 DIAGNOSIS — Z9981 Dependence on supplemental oxygen: Secondary | ICD-10-CM | POA: Insufficient documentation

## 2017-05-29 DIAGNOSIS — R079 Chest pain, unspecified: Principal | ICD-10-CM

## 2017-05-29 DIAGNOSIS — I447 Left bundle-branch block, unspecified: Secondary | ICD-10-CM | POA: Diagnosis not present

## 2017-05-29 DIAGNOSIS — Z79899 Other long term (current) drug therapy: Secondary | ICD-10-CM | POA: Diagnosis not present

## 2017-05-29 DIAGNOSIS — Z87891 Personal history of nicotine dependence: Secondary | ICD-10-CM | POA: Diagnosis not present

## 2017-05-29 DIAGNOSIS — I429 Cardiomyopathy, unspecified: Secondary | ICD-10-CM | POA: Insufficient documentation

## 2017-05-29 DIAGNOSIS — I110 Hypertensive heart disease with heart failure: Secondary | ICD-10-CM | POA: Insufficient documentation

## 2017-05-29 DIAGNOSIS — I714 Abdominal aortic aneurysm, without rupture: Secondary | ICD-10-CM | POA: Insufficient documentation

## 2017-05-29 DIAGNOSIS — I10 Essential (primary) hypertension: Secondary | ICD-10-CM | POA: Diagnosis present

## 2017-05-29 DIAGNOSIS — Z8673 Personal history of transient ischemic attack (TIA), and cerebral infarction without residual deficits: Secondary | ICD-10-CM | POA: Diagnosis not present

## 2017-05-29 DIAGNOSIS — J84112 Idiopathic pulmonary fibrosis: Secondary | ICD-10-CM | POA: Diagnosis present

## 2017-05-29 DIAGNOSIS — Z85828 Personal history of other malignant neoplasm of skin: Secondary | ICD-10-CM | POA: Diagnosis not present

## 2017-05-29 DIAGNOSIS — E785 Hyperlipidemia, unspecified: Secondary | ICD-10-CM | POA: Insufficient documentation

## 2017-05-29 DIAGNOSIS — J449 Chronic obstructive pulmonary disease, unspecified: Secondary | ICD-10-CM | POA: Diagnosis not present

## 2017-05-29 DIAGNOSIS — I5022 Chronic systolic (congestive) heart failure: Secondary | ICD-10-CM | POA: Diagnosis not present

## 2017-05-29 LAB — BASIC METABOLIC PANEL
Anion gap: 10 (ref 5–15)
BUN: 12 mg/dL (ref 6–20)
CHLORIDE: 106 mmol/L (ref 101–111)
CO2: 22 mmol/L (ref 22–32)
CREATININE: 0.95 mg/dL (ref 0.61–1.24)
Calcium: 9.4 mg/dL (ref 8.9–10.3)
GFR calc Af Amer: >60 mL/min (ref >60)
GFR calc non Af Amer: >60 mL/min (ref >60)
GLUCOSE: 102 mg/dL — AB (ref 65–99)
Potassium: 4.2 mmol/L (ref 3.5–5.1)
Sodium: 138 mmol/L (ref 135–145)

## 2017-05-29 LAB — CBC
HCT: 43.7 % (ref 39.0–52.0)
Hemoglobin: 15.1 g/dL (ref 13.0–17.0)
MCH: 31.9 pg (ref 26.0–34.0)
MCHC: 34.6 g/dL (ref 30.0–36.0)
MCV: 92.2 fL (ref 78.0–100.0)
PLATELETS: 228 10*3/uL (ref 150–400)
RBC: 4.74 MIL/uL (ref 4.22–5.81)
RDW: 13.3 % (ref 11.5–15.5)
WBC: 9.7 10*3/uL (ref 4.0–10.5)

## 2017-05-29 LAB — I-STAT TROPONIN, ED: Troponin i, poc: 0 ng/mL (ref 0.00–0.08)

## 2017-05-29 NOTE — ED Triage Notes (Signed)
Pt arrives via POV from home with left sided chest pain for the last 3 days. Pt c/o SOB with the pain and reports it last 4-57minutes then goes away. Pt with known LBBB. Denies pain at present.

## 2017-05-30 ENCOUNTER — Observation Stay (HOSPITAL_BASED_OUTPATIENT_CLINIC_OR_DEPARTMENT_OTHER): Payer: Medicare Other

## 2017-05-30 ENCOUNTER — Encounter (HOSPITAL_COMMUNITY): Payer: Self-pay | Admitting: Internal Medicine

## 2017-05-30 DIAGNOSIS — R079 Chest pain, unspecified: Secondary | ICD-10-CM

## 2017-05-30 DIAGNOSIS — I208 Other forms of angina pectoris: Secondary | ICD-10-CM

## 2017-05-30 DIAGNOSIS — I340 Nonrheumatic mitral (valve) insufficiency: Secondary | ICD-10-CM

## 2017-05-30 DIAGNOSIS — J84112 Idiopathic pulmonary fibrosis: Secondary | ICD-10-CM

## 2017-05-30 DIAGNOSIS — I10 Essential (primary) hypertension: Secondary | ICD-10-CM

## 2017-05-30 LAB — NM MYOCAR MULTI W/SPECT W/WALL MOTION / EF
CHL CUP MPHR: 136 {beats}/min
CSEPHR: 63 %
CSEPPHR: 86 {beats}/min
Estimated workload: 1 METS
Exercise duration (min): 5 min
Rest HR: 68 {beats}/min

## 2017-05-30 LAB — COMPREHENSIVE METABOLIC PANEL
ALBUMIN: 3.1 g/dL — AB (ref 3.5–5.0)
ALK PHOS: 54 U/L (ref 38–126)
ALT: 20 U/L (ref 17–63)
ANION GAP: 8 (ref 5–15)
AST: 22 U/L (ref 15–41)
BUN: 12 mg/dL (ref 6–20)
CO2: 25 mmol/L (ref 22–32)
Calcium: 8.8 mg/dL — ABNORMAL LOW (ref 8.9–10.3)
Chloride: 106 mmol/L (ref 101–111)
Creatinine, Ser: 1.01 mg/dL (ref 0.61–1.24)
GFR calc Af Amer: >60 mL/min (ref >60)
GFR calc non Af Amer: >60 mL/min (ref >60)
GLUCOSE: 132 mg/dL — AB (ref 65–99)
POTASSIUM: 3.5 mmol/L (ref 3.5–5.1)
SODIUM: 139 mmol/L (ref 135–145)
Total Bilirubin: 0.6 mg/dL (ref 0.3–1.2)
Total Protein: 5.9 g/dL — ABNORMAL LOW (ref 6.5–8.1)

## 2017-05-30 LAB — ECHOCARDIOGRAM COMPLETE

## 2017-05-30 LAB — TROPONIN I: Troponin I: <0.03 ng/mL (ref <0.03)

## 2017-05-30 MED ORDER — EZETIMIBE 10 MG PO TABS
10.0000 mg | ORAL_TABLET | Freq: Every day | ORAL | Status: DC
  Administered 2017-05-30: 10 mg via ORAL
  Filled 2017-05-30: qty 1

## 2017-05-30 MED ORDER — CARVEDILOL 12.5 MG PO TABS
12.5000 mg | ORAL_TABLET | Freq: Once | ORAL | Status: AC
  Administered 2017-05-30: 12.5 mg via ORAL
  Filled 2017-05-30: qty 1

## 2017-05-30 MED ORDER — PERFLUTREN LIPID MICROSPHERE
1.0000 mL | INTRAVENOUS | Status: AC | PRN
  Administered 2017-05-30: 2 mL via INTRAVENOUS
  Filled 2017-05-30: qty 10

## 2017-05-30 MED ORDER — SODIUM CHLORIDE 0.9 % IV SOLN
INTRAVENOUS | Status: AC
  Administered 2017-05-30: 07:00:00 via INTRAVENOUS

## 2017-05-30 MED ORDER — LOSARTAN POTASSIUM 50 MG PO TABS
50.0000 mg | ORAL_TABLET | Freq: Every day | ORAL | Status: DC
  Administered 2017-05-30: 50 mg via ORAL
  Filled 2017-05-30: qty 1

## 2017-05-30 MED ORDER — TECHNETIUM TC 99M TETROFOSMIN IV KIT
30.0000 | PACK | Freq: Once | INTRAVENOUS | Status: AC | PRN
  Administered 2017-05-30: 30 via INTRAVENOUS

## 2017-05-30 MED ORDER — ACETAMINOPHEN 650 MG RE SUPP: 650.0000 mg | Freq: Four times a day (QID) | RECTAL | Status: DC | PRN

## 2017-05-30 MED ORDER — ENOXAPARIN SODIUM 40 MG/0.4ML ~~LOC~~ SOLN
40.0000 mg | SUBCUTANEOUS | Status: DC
  Administered 2017-05-30: 40 mg via SUBCUTANEOUS
  Filled 2017-05-30: qty 0.4

## 2017-05-30 MED ORDER — ASPIRIN 81 MG PO CHEW
324.0000 mg | CHEWABLE_TABLET | Freq: Once | ORAL | Status: AC
  Administered 2017-05-30: 324 mg via ORAL
  Filled 2017-05-30: qty 4

## 2017-05-30 MED ORDER — ALBUTEROL SULFATE HFA 108 (90 BASE) MCG/ACT IN AERS: 2.0000 | INHALATION_SPRAY | Freq: Four times a day (QID) | RESPIRATORY_TRACT | Status: DC | PRN

## 2017-05-30 MED ORDER — CLOPIDOGREL BISULFATE 75 MG PO TABS
75.0000 mg | ORAL_TABLET | Freq: Every day | ORAL | Status: DC
  Administered 2017-05-30: 75 mg via ORAL
  Filled 2017-05-30: qty 1

## 2017-05-30 MED ORDER — ASPIRIN EC 81 MG PO TBEC
81.0000 mg | DELAYED_RELEASE_TABLET | Freq: Every day | ORAL | Status: DC
  Administered 2017-05-30: 81 mg via ORAL
  Filled 2017-05-30: qty 1

## 2017-05-30 MED ORDER — PANTOPRAZOLE SODIUM 40 MG PO TBEC
40.0000 mg | DELAYED_RELEASE_TABLET | Freq: Every day | ORAL | Status: DC
  Administered 2017-05-30: 40 mg via ORAL
  Filled 2017-05-30: qty 1

## 2017-05-30 MED ORDER — ACETAMINOPHEN 325 MG PO TABS: 650.0000 mg | ORAL_TABLET | Freq: Four times a day (QID) | ORAL | Status: DC | PRN

## 2017-05-30 MED ORDER — REGADENOSON 0.4 MG/5ML IV SOLN
0.4000 mg | Freq: Once | INTRAVENOUS | Status: AC
  Administered 2017-05-30: 0.4 mg via INTRAVENOUS

## 2017-05-30 MED ORDER — TIOTROPIUM BROMIDE MONOHYDRATE 18 MCG IN CAPS: 18.0000 ug | ORAL_CAPSULE | Freq: Every day | RESPIRATORY_TRACT | Status: DC

## 2017-05-30 MED ORDER — CARVEDILOL 12.5 MG PO TABS: 12.5000 mg | ORAL_TABLET | Freq: Two times a day (BID) | ORAL | Status: DC

## 2017-05-30 MED ORDER — REGADENOSON 0.4 MG/5ML IV SOLN
INTRAVENOUS | Status: AC
  Filled 2017-05-30: qty 5

## 2017-05-30 MED ORDER — TECHNETIUM TC 99M TETROFOSMIN IV KIT
10.0000 | PACK | Freq: Once | INTRAVENOUS | Status: AC | PRN
  Administered 2017-05-30: 10 via INTRAVENOUS

## 2017-05-30 MED ORDER — PRAVASTATIN SODIUM 40 MG PO TABS
40.0000 mg | ORAL_TABLET | Freq: Every day | ORAL | Status: DC
Start: 2017-05-30 — End: 2017-05-30

## 2017-05-30 NOTE — ED Notes (Signed)
Attempted report x1. 

## 2017-05-30 NOTE — Discharge Summary (Signed)
Physician Discharge Summary  Lee [name] SJG:[id number] DOB: [birth year] DOA: 05/29/2017  PCP: [name], MD  Admit date: 05/29/2017 Discharge date: 05/30/2017   Recommendations for Outpatient Follow-Up:   1. Continued close follow up with specialists 2. ?palliative care referral- defer to PCP-- full code   Discharge Diagnosis:   Principal Problem:   Chest pain Active Problems:   IPF (idiopathic pulmonary fibrosis) ([place])   Essential hypertension   Discharge disposition:  Home  Discharge Condition: Improved.  Diet recommendation: Low sodium, heart healthy  Wound care: None.   History of Present Illness:   Lee [name]  is a 83 y.o. male, w CVA, Hypertension, Hyperlipidemia, CHF (EF 40-45%) PVD, Copd, ILD, apparently c/o chest pain left sided without radiation occurring since this past Saturday, thought to be exertional. + cough, yellow sputum (chronic), denies fever, chills, palp, sob beyond his baseline, n/v, gerd.   Pt may have some dementia. Pt was seen by pcp today and sent to ER for evaluation.    In ED, EKG nsr at 70 , nl axis, LBBB, CXR => IMPRESSION: Fibrotic appearing interstitial coarsening without interval change. No consolidation or effusion.  Trop I 0.00,  Bun/creatinine 12/0.95,  Pt will be admitted for chest pain.  Currently chest pain free.     Hospital Course by Problem:   Chest pain -CE negative -echo w/o evidence of ischemia -low risk stress test -? Lung related-- follows with pulm for ILD -continue O2 at night    Medical Consultants:   cards  Discharge Exam:   Vitals:   05/30/17 1202 05/30/17 1342  BP: (!) 153/74 (!) 152/56  Pulse:  77  Resp:  20  Temp:    SpO2:  99%   Vitals:   05/30/17 1159 05/30/17 1200 05/30/17 1202 05/30/17 1342  BP: (!) 151/70 (!) 142/77 (!) 153/74 (!) 152/56  Pulse:    77  Resp:    20  Temp:      TempSrc:      SpO2:    99%    Gen:  NAD    The results of significant diagnostics from this  hospitalization (including imaging, microbiology, ancillary and laboratory) are listed below for reference.     Procedures and Diagnostic Studies:   Dg Chest 2 View  Result Date: 05/29/2017 CLINICAL DATA:  Left-sided chest pain for 3 days. Intermittent dyspnea. EXAM: CHEST  2 VIEW COMPARISON:  05/29/2015 FINDINGS: Shallow inspiration. Diffuse interstitial coarsening, unchanged and likely fibrotic. No airspace consolidation. No effusion. Normal pulmonary vasculature. Unchanged hilar, mediastinal and cardiac contours. IMPRESSION: Fibrotic appearing interstitial coarsening without interval change. No consolidation or effusion. Electronically Signed   By: Andreas Newport M.D.   On: 05/29/2017 18:27   Nm Myocar Multi W/spect W/wall Motion / Ef  Result Date: 05/30/2017 CLINICAL DATA:  Chest pain. Hypertension. Elevated lipids. Shortness of breath, CHF and lung disease. EXAM: MYOCARDIAL IMAGING WITH SPECT (REST AND PHARMACOLOGIC-STRESS) GATED LEFT VENTRICULAR WALL MOTION STUDY LEFT VENTRICULAR EJECTION FRACTION TECHNIQUE: Standard myocardial SPECT imaging was performed after resting intravenous injection of 10 mCi Tc-103m tetrofosmin. Subsequently, intravenous infusion of Lexiscan was performed under the supervision of the Cardiology staff. At peak effect of the drug, 30 mCi Tc-39m tetrofosmin was injected intravenously and standard myocardial SPECT imaging was performed. Quantitative gated imaging was also performed to evaluate left ventricular wall motion, and estimate left ventricular ejection fraction. COMPARISON:  None. FINDINGS: Perfusion: No decreased activity in the left ventricle on stress imaging to suggest reversible ischemia or  infarction. Wall Motion: Inferior and inferior septal hypokinesis identified. Left Ventricular Ejection Fraction: 55 % End diastolic volume 87 ml End systolic volume 39 ml IMPRESSION: 1. No reversible ischemia or infarction. 2. Inferior septal hypokinesis identified. 3. Left  ventricular ejection fraction 55% 4. Non invasive risk stratification*: Low *2012 Appropriate Use Criteria for Coronary Revascularization Focused Update: J Am Coll Cardiol. 2119;41(7):408-144. http://content.airportbarriers.com.aspx?articleid=[email or web address] Electronically Signed   By: Kerby Moors M.D.   On: 05/30/2017 14:19     Labs:   Basic Metabolic Panel:  Recent Labs Lab 05/29/17 2033 05/30/17 0409  NA 138 139  K 4.2 3.5  CL 106 106  CO2 22 25  GLUCOSE 102* 132*  BUN 12 12  CREATININE 0.95 1.01  CALCIUM 9.4 8.8*   GFR CrCl cannot be calculated (Unknown ideal weight.). Liver Function Tests:  Recent Labs Lab 05/30/17 0409  AST 22  ALT 20  ALKPHOS 54  BILITOT 0.6  PROT 5.9*  ALBUMIN 3.1*   No results for input(s): LIPASE, AMYLASE in the last 168 hours. No results for input(s): AMMONIA in the last 168 hours. Coagulation profile No results for input(s): INR, PROTIME in the last 168 hours.  CBC:  Recent Labs Lab 05/29/17 2033  WBC 9.7  HGB 15.1  HCT 43.7  MCV 92.2  PLT 228   Cardiac Enzymes:  Recent Labs Lab 05/30/17 0409  TROPONINI <0.03   BNP: Invalid input(s): POCBNP CBG: No results for input(s): GLUCAP in the last 168 hours. D-Dimer No results for input(s): DDIMER in the last 72 hours. Hgb A1c No results for input(s): HGBA1C in the last 72 hours. Lipid Profile No results for input(s): CHOL, HDL, LDLCALC, TRIG, CHOLHDL, LDLDIRECT in the last 72 hours. Thyroid function studies No results for input(s): TSH, T4TOTAL, T3FREE, THYROIDAB in the last 72 hours.  Invalid input(s): FREET3 Anemia work up No results for input(s): VITAMINB12, FOLATE, FERRITIN, TIBC, IRON, RETICCTPCT in the last 72 hours. Microbiology No results found for this or any previous visit (from the past 240 hour(s)).   Discharge Instructions:   Discharge Instructions    Diet - low sodium heart healthy    Complete by:  As directed    Increase activity slowly    Complete  by:  As directed      Allergies as of 05/30/2017      Reactions   Ciprofloxacin Other (See Comments)   Tingle feeling throughout body   Tape Other (See Comments)   Redness, Please use "paper" tape      Medication List    STOP taking these medications   cephALEXin 500 MG capsule Commonly known as:  KEFLEX     TAKE these medications   albuterol 108 (90 Base) MCG/ACT inhaler Commonly known as:  PROVENTIL HFA;VENTOLIN HFA Inhale 2 puffs into the lungs every 6 (six) hours as needed for wheezing or shortness of breath.   carvedilol 12.5 MG tablet Commonly known as:  COREG TAKE 1 TABLET (12.5 MG TOTAL) BY MOUTH 2 (TWO) TIMES DAILY WITH A MEAL.   clopidogrel 75 MG tablet Commonly known as:  PLAVIX Take 75 mg by mouth daily.   ezetimibe 10 MG tablet Commonly known as:  ZETIA Take 10 mg by mouth daily.   LIVALO 2 MG Tabs Generic drug:  Pitavastatin Calcium Take 2 mg by mouth See admin instructions. Every 3rd day   losartan 50 MG tablet Commonly known as:  COZAAR Take 50 mg by mouth daily.   multivitamin with minerals Tabs tablet Take  1 tablet by mouth daily.   nitroGLYCERIN 0.4 MG SL tablet Commonly known as:  NITROSTAT Place 1 tablet (0.4 mg total) under the tongue every 5 (five) minutes as needed for chest pain.   pantoprazole 40 MG tablet Commonly known as:  PROTONIX Take 40 mg by mouth daily.   PHILLIPS COLON HEALTH Caps Take 1 capsule by mouth daily.   tiotropium 18 MCG inhalation capsule Commonly known as:  SPIRIVA Place 1 capsule (18 mcg total) into inhaler and inhale daily.   VITAMIN D PO Take 1 tablet by mouth daily.      Follow-up Information    [name], MD Follow up in 1 week(s).   Specialty:  Internal Medicine Contact information: [address] [place] [address] [phone]            Time coordinating discharge: 35 min  Signed:  [name] Alison Stalling   [facility] 05/30/2017, 2:47 PM

## 2017-05-30 NOTE — Consult Note (Signed)
Cardiology Consultation:   Patient ID: Lee [name]; [id number]; March 13, 1931   Admit date: 05/29/2017 Date of Consult: 05/30/2017  Primary Care Provider: [name], MD Primary Cardiologist: Dr. Ellyn Hack    Patient Profile:   Lee [name] is a 83 y.o. male with a hx of mild cardiomyopathy w/ EF of 40-45%, nonischemic Myoview in 4818, chronic diastolic dysfunction, chronic LBBB, AAA s/p surgical repair followed by Dr. Donnetta Hutching, HTN, HLD, prior stroke, and h/o chronic respiratory failure 2/2 COPD and pulmonary fibrosis, on home O2 and followed by pulmonology,  who is being seen today for the evaluation of chest pain at the request of Dr. Eliseo Squires, Internal medicine.  History of Present Illness:   Pt is a former pt of Dr. Mare Ferrari and now followed by Dr. Ellyn Hack. PMH is outlined in detail above. No known h/o CAD. No previous caths. However low risk nuclear stress test in the past. Last stress test was in 2016 and negative for ischemia.   Pt presented to [facility] ED yesterday evening with complaint of chest pain. There is ? regarding mild dementia. Pt with difficulties recalling recent symptoms. He notes he had CP this past weekend, but no pain currently. He points to his left chest but no radiation. He cannot call if worse with activity of not. Unsure if tight, pressure, burning, sharp or stabbing. When asked what his pain was like, he states "I cant remember". He appears comfortable at rest.   In ED, EKG showed NSR with chronic LBBB. CXR nonacute. BP elevated in the ED in the 563J systolic. Troponin is negative x 3. CBC and BMP unremarkable. Pt has been seen by IM and ordered to undergo a nuclear stress test. Pt currently in nuclear medicine for study. Results pending.   Past Medical History:  Diagnosis Date  . Allergic rhinitis   . Anxiety   . Basal cell carcinoma of face   . Cardiomyopathy ([place]) 04/2015   EF 40-45% by echo. Apical anterior akinesis seen on echo, not confirmed on Myoview was  negative for ischemia. Myoview suggested a small basal inferoseptal defect.  . Cholecystitis    a. complex admit 04/2015 due to sepsis due to acute Escherichia coli cholecystitis and right lower lobe CAP and Citrobacter UTI.  [name] Chronic respiratory failure ([name])    a. on home O2 after discharge 04/2015.  [name] Chronic systolic CHF (congestive heart failure) ([place])    a. Dx 04/2015 during complex admission for cholecystitis - EF 40-45% +WMA.  [name] COPD (chronic obstructive pulmonary disease) ([place])   . Erectile dysfunction   . History of endovascular stent graft for abdominal aortic aneurysm 2002   a. s/p repair  Followed by Dr. Donnetta Hutching (stable evaluation 01/2106)  . History of hiatal hernia   . HTN (hypertension)   . Hyperlipidemia   . Interstitial lung disease ([place])    a. followed by pulm.  [name] LBBB (left bundle branch block)    a. Intermittent - seen in 2013, not in 09/2013, but again in 04/2015.  . Obesity   . On home oxygen therapy    "just at night" (10/05/2015)  . PVD (peripheral vascular disease) ([place])   . Shingles   . Stroke [facility])    a. H/o aphasia - was told he'd had ministrokes. Imaging 09/2014 revealed prior infarct.    Past Surgical History:  Procedure Laterality Date  . AORTA SURGERY  2002  . CHOLECYSTECTOMY N/A 10/05/2015   Procedure: LAPAROSCOPIC CHOLECYSTECTOMY  ;  Surgeon: Autumn Messing  III, MD;  Location: [place];  Service: General;  Laterality: N/A;  . [place]  . INTRAOPERATIVE CHOLANGIOGRAM  10/05/2015   Procedure: INTRAOPERATIVE CHOLANGIOGRAM;  Surgeon: Autumn Messing III, MD;  Location: [place];  Service: General;;  . LAPAROSCOPIC CHOLECYSTECTOMY  10/05/2015  . NM MYOVIEW LTD  05/2015   EF 49%. Small size, mild severity perfusion defect in the basal-mid inferoseptal wall. LOW RISK. No ischemia. Defect thought to be related to LBBB artifacts and not true lesion. This could also explain mild systolic dysfunction.  . TRANSTHORACIC ECHOCARDIOGRAM  04/2015   (In setting of acute  illness).EF 40-45% with mid-apical anteroseptal akinesis. Moderately increased PA pressures of roughly 48 mmHg.  [name] URETHRAL STRICTURE DILATATION  late 1970s   urinary tract stretch     Home Medications:  Prior to Admission medications   Medication Sig Start Date End Date Taking? Authorizing Provider  albuterol (PROVENTIL HFA;VENTOLIN HFA) 108 (90 BASE) MCG/ACT inhaler Inhale 2 puffs into the lungs every 6 (six) hours as needed for wheezing or shortness of breath. 05/05/15  Yes Short, Noah Delaine, MD  carvedilol (COREG) 12.5 MG tablet TAKE 1 TABLET (12.5 MG TOTAL) BY MOUTH 2 (TWO) TIMES DAILY WITH A MEAL. 08/29/16  Yes Leonie Man, MD  Cholecalciferol (VITAMIN D PO) Take 1 tablet by mouth daily.   Yes [provider]  clopidogrel (PLAVIX) 75 MG tablet Take 75 mg by mouth daily.  09/24/14  Yes [provider]  ezetimibe (ZETIA) 10 MG tablet Take 10 mg by mouth daily.    Yes [provider]  losartan (COZAAR) 50 MG tablet Take 50 mg by mouth daily.   Yes [provider]  Multiple Vitamin (MULTIVITAMIN WITH MINERALS) TABS tablet Take 1 tablet by mouth daily.   Yes [provider]  nitroGLYCERIN (NITROSTAT) 0.4 MG SL tablet Place 1 tablet (0.4 mg total) under the tongue every 5 (five) minutes as needed for chest pain. 05/05/15  Yes Short, Noah Delaine, MD  pantoprazole (PROTONIX) 40 MG tablet Take 40 mg by mouth daily. 09/25/14  Yes [provider]  Pitavastatin Calcium (LIVALO) 2 MG TABS Take 2 mg by mouth See admin instructions. Every 3rd day   Yes [provider]  Probiotic Product ([place]) CAPS Take 1 capsule by mouth daily.   Yes [provider]  tiotropium (SPIRIVA) 18 MCG inhalation capsule Place 1 capsule (18 mcg total) into inhaler and inhale daily. 05/05/15  Yes Short, Noah Delaine, MD  cephALEXin (KEFLEX) 500 MG capsule Take 1 capsule (500 mg total) by mouth 3 (three) times daily. Patient not taking: Reported on  05/30/2017 01/23/17   Brand Males, MD    Inpatient Medications: Scheduled Meds: . aspirin EC  81 mg Oral Daily  . carvedilol  12.5 mg Oral BID WC  . clopidogrel  75 mg Oral Daily  . enoxaparin (LOVENOX) injection  40 mg Subcutaneous Q24H  . ezetimibe  10 mg Oral Daily  . losartan  50 mg Oral Daily  . pantoprazole  40 mg Oral Daily  . pravastatin  40 mg Oral q1800  . regadenoson      . tiotropium  18 mcg Inhalation Daily   Continuous Infusions: . sodium chloride 75 mL/hr at 05/30/17 0715   PRN Meds: acetaminophen **OR** acetaminophen, albuterol  Allergies:    Allergies  Allergen Reactions  . Ciprofloxacin Other (See Comments)    Tingle feeling throughout body  . Tape Other (See Comments)    Redness, Please use "  paper" tape    Social History:   Social History   Social History  . Marital status: Married    Spouse name: N/A  . Number of children: N/A  . Years of education: N/A   Occupational History  . retired    Social History Main Topics  . Smoking status: Former Smoker    Packs/day: 1.00    Years: 25.00    Types: Cigarettes    Quit date: 08/22/1974  . Smokeless tobacco: Never Used  . Alcohol use No  . Drug use: No  . Sexual activity: Not on file   Other Topics Concern  . Not on file   Social History Narrative  . No narrative on file    Family History:    Family History  Problem Relation Age of Onset  . Hypertension Mother   . Stroke Father   . Heart disease Father   . Colon cancer Brother      ROS:  Please see the history of present illness.  ROS  All other ROS reviewed and negative.     Physical Exam/Data:   Vitals:   05/30/17 1153 05/30/17 1159 05/30/17 1200 05/30/17 1202  BP: 138/76 (!) 151/70 (!) 142/77 (!) 153/74  Pulse:      Resp:      Temp:      TempSrc:      SpO2:        Intake/Output Summary (Last 24 hours) at 05/30/17 1209 Last data filed at 05/30/17 1011  Gross per 24 hour  Intake                0 ml  Output                75 ml  Net              -75 ml   There were no vitals filed for this visit. There is no height or weight on file to calculate BMI.  General:  Well nourished, well developed, in no acute distress HEENT: normal Lymph: no adenopathy Neck: no JVD Endocrine:  No thryomegaly Vascular: No carotid bruits; FA pulses 2+ bilaterally without bruits  Cardiac:  normal S1, S2; RRR; no murmur  Lungs:  clear to auscultation bilaterally, no wheezing, rhonchi or rales  Abd: soft, nontender, no hepatomegaly  Ext: no edema Musculoskeletal:  No deformities, BUE and BLE strength normal and equal Skin: warm and dry  Neuro:  ? Mild dementia Psych:  Normal affect   EKG:  The EKG was personally reviewed and demonstrates:  NSR with chronic LBBB unchanged from previous  Telemetry:  Telemetry was personally reviewed and demonstrates:  NSR with LBBB  Relevant CV Studies: NST - pending 2D echo pending    Laboratory Data:  Chemistry  Recent Labs Lab 05/29/17 2033 05/30/17 0409  NA 138 139  K 4.2 3.5  CL 106 106  CO2 22 25  GLUCOSE 102* 132*  BUN 12 12  CREATININE 0.95 1.01  CALCIUM 9.4 8.8*  GFRNONAA >60 >60  GFRAA >60 >60  ANIONGAP 10 8     Recent Labs Lab 05/30/17 0409  PROT 5.9*  ALBUMIN 3.1*  AST 22  ALT 20  ALKPHOS 54  BILITOT 0.6   Hematology  Recent Labs Lab 05/29/17 2033  WBC 9.7  RBC 4.74  HGB 15.1  HCT 43.7  MCV 92.2  MCH 31.9  MCHC 34.6  RDW 13.3  PLT 228   Cardiac Enzymes  Recent Labs Lab  05/30/17 0409  TROPONINI <0.03     Recent Labs Lab 05/29/17 2111  TROPIPOC 0.00    BNPNo results for input(s): BNP, PROBNP in the last 168 hours.  DDimer No results for input(s): DDIMER in the last 168 hours.  Radiology/Studies:  Dg Chest 2 View  Result Date: 05/29/2017 CLINICAL DATA:  Left-sided chest pain for 3 days. Intermittent dyspnea. EXAM: CHEST  2 VIEW COMPARISON:  05/29/2015 FINDINGS: Shallow inspiration. Diffuse interstitial coarsening, unchanged  and likely fibrotic. No airspace consolidation. No effusion. Normal pulmonary vasculature. Unchanged hilar, mediastinal and cardiac contours. IMPRESSION: Fibrotic appearing interstitial coarsening without interval change. No consolidation or effusion. Electronically Signed   By: Andreas Newport M.D.   On: 05/29/2017 18:27    Assessment and Plan:   1. Chest Pain: MAYUR DUMAN is a 83 y.o. male with a hx of mild cardiomyopathy w/ EF of 40-45%, nonischemic Myoview in 2956, chronic diastolic dysfunction, chronic LBBB, AAA s/p surgical repair followed by Dr. Donnetta Hutching, HTN, HLD, prior stroke, and h/o chronic respiratory failure 2/2 COPD and pulmonary fibrosis, on home O2 and followed by pulmonology,  who is being seen today for the evaluation of chest pain at the request of Dr. Eliseo Squires, Internal medicine.  In ED, troponin was negative. EKG NSR with chronic LBBB. CXR, CBC and BMP unremarkable. 2D echo has been completed. Interpretation pending.  NST is pending. Further recs to follow based on stress test results and echo results. If abnormal findings suggestive of ischemia, he will need definitives cath. If negative for ischemia or no significant change in LVF/ wall motion, continue medical therapy.   MD to follow with further recs.    For questions or updates, please contact [place] Please consult [email or web address] for contact info under Cardiology/STEMI.   Signed, Lyda Jester, PA-C  05/30/2017 12:09 PM   Agree with note written by Ellen Henri  PAC   Asked to see Mr. Banfill by the internal medicine service because of recent chest pain. He previously was a patient of Dr. Mare Ferrari and currently is followed by Dr. Ellyn Hack. There is a history of left bundle branch block and nonischemic cardiomyopathy with low normal EF. Other problems as outlined above. He is seen today because of waxing and waning chest pain over the weekend. He is currently pain-free. His exam is benign. His pains have a  somewhat atypical. He's had multiple stress tests in the past but never has had a heart catheterization. His workup includes a normal 2-D echo and Myoview stress test without evidence of ischemia. His troponins are negative. At this point, I do not think is patent is ischemically mediated. Workup for other causes of chest pain would be reasonable although I be comfortable allowing him to go home as well. We can follow him up as an outpatient.   Quay Burow 05/30/2017 2:23 PM

## 2017-05-30 NOTE — ED Notes (Signed)
Pt still at nuc med. Family given coffee and updated.

## 2017-05-30 NOTE — Progress Notes (Signed)
  Echocardiogram 2D Echocardiogram has been performed.  Roseanna Rainbow R 05/30/2017, 10:08 AM

## 2017-05-30 NOTE — ED Notes (Signed)
Pt in [place], will administer medications when pt returns

## 2017-05-30 NOTE — ED Provider Notes (Signed)
TIME SEEN: 12:06 AM  CHIEF COMPLAINT: Chest pain  HPI: Patient is an 83 year old male with history of hypertension, hyperlipidemia, peripheral vascular disease, AAA, CVA, cardiomyopathy presents to the emergency department with complaints of intermittent chest pain. He has had intermittent chest pain in the left-sided chest without radiation since Saturday evening. Worse with exertion and better with rest. Describes it as a pressure with associated shortness of breath, nausea and feeling clammy. He is currently on Plavix. He has never had a cardiac catheterization. His last stress test was in January 2017. Was seen by his primary care physician Dr. Joylene Draft today he was concerned about his exertional chest pain with multiple risk factors and sent him to the emergency department for further evaluation. Patient chest pain-free currently.  ROS: See HPI Constitutional: no fever  Eyes: no drainage  ENT: no runny nose   Cardiovascular:  chest pain  Resp: SOB  GI: no vomiting GU: no dysuria Integumentary: no rash  Allergy: no hives  Musculoskeletal: no leg swelling  Neurological: no slurred speech ROS otherwise negative  PAST MEDICAL HISTORY/PAST SURGICAL HISTORY:  Past Medical History:  Diagnosis Date  . Allergic rhinitis   . Anxiety   . Basal cell carcinoma of face   . Cardiomyopathy ([place]) 04/2015   EF 40-45% by echo. Apical anterior akinesis seen on echo, not confirmed on Myoview was negative for ischemia. Myoview suggested a small basal inferoseptal defect.  . Cholecystitis    a. complex admit 04/2015 due to sepsis due to acute Escherichia coli cholecystitis and right lower lobe CAP and Citrobacter UTI.  [name] Chronic respiratory failure ([place])    a. on home O2 after discharge 04/2015.  [name] Chronic systolic CHF (congestive heart failure) ([place])    a. Dx 04/2015 during complex admission for cholecystitis - EF 40-45% +WMA.  [name] COPD (chronic obstructive pulmonary disease) ([place])   . Erectile  dysfunction   . History of endovascular stent graft for abdominal aortic aneurysm 2002   a. s/p repair  Followed by Dr. Donnetta Hutching (stable evaluation 01/2106)  . History of hiatal hernia   . HTN (hypertension)   . Hyperlipidemia   . Interstitial lung disease ([place])    a. followed by pulm.  [name] LBBB (left bundle branch block)    a. Intermittent - seen in 2013, not in 09/2013, but again in 04/2015.  . Obesity   . On home oxygen therapy    "just at night" (10/05/2015)  . PVD (peripheral vascular disease) ([place])   . Shingles   . Stroke [facility])    a. H/o aphasia - was told he'd had ministrokes. Imaging 09/2014 revealed prior infarct.    MEDICATIONS:  Prior to Admission medications   Medication Sig Start Date End Date Taking? Authorizing Provider  albuterol (PROVENTIL HFA;VENTOLIN HFA) 108 (90 BASE) MCG/ACT inhaler Inhale 2 puffs into the lungs every 6 (six) hours as needed for wheezing or shortness of breath. 05/05/15   Janece Canterbury, MD  carvedilol (COREG) 12.5 MG tablet TAKE 1 TABLET (12.5 MG TOTAL) BY MOUTH 2 (TWO) TIMES DAILY WITH A MEAL. 08/29/16   Leonie Man, MD  cephALEXin (KEFLEX) 500 MG capsule Take 1 capsule (500 mg total) by mouth 3 (three) times daily. 01/23/17   Brand Males, MD  Cholecalciferol (VITAMIN D PO) Take 1 tablet by mouth daily.    [provider]  clopidogrel (PLAVIX) 75 MG tablet Take 75 mg by mouth daily.  09/24/14   [provider]  ezetimibe (ZETIA) 10 MG  tablet Take 10 mg by mouth daily.     [provider]  losartan (COZAAR) 50 MG tablet Take 50 mg by mouth daily.    [provider]  Multiple Vitamin (MULTIVITAMIN WITH MINERALS) TABS tablet Take 1 tablet by mouth daily.    [provider]  nitroGLYCERIN (NITROSTAT) 0.4 MG SL tablet Place 1 tablet (0.4 mg total) under the tongue every 5 (five) minutes as needed for chest pain. 05/05/15   Janece Canterbury, MD  pantoprazole (PROTONIX) 40 MG tablet Take 40 mg by mouth daily.  09/25/14   [provider]  Pitavastatin Calcium (LIVALO) 2 MG TABS Take 2 mg by mouth See admin instructions. Every 3rd day    [provider]  Probiotic Product ([place]) CAPS Take 1 capsule by mouth daily.    [provider]  tiotropium (SPIRIVA) 18 MCG inhalation capsule Place 1 capsule (18 mcg total) into inhaler and inhale daily. 05/05/15   Janece Canterbury, MD    ALLERGIES:  Allergies  Allergen Reactions  . Ciprofloxacin Other (See Comments)    Tingle feeling throughout body  . Tape Other (See Comments)    Redness, Please use "paper" tape    SOCIAL HISTORY:  Social History  Substance Use Topics  . Smoking status: Former Smoker    Packs/day: 1.00    Years: 25.00    Types: Cigarettes    Quit date: 08/22/1974  . Smokeless tobacco: Never Used  . Alcohol use No    FAMILY HISTORY: Family History  Problem Relation Age of Onset  . Hypertension Mother   . Stroke Father   . Heart disease Father   . Colon cancer Brother     EXAM: BP (!) 175/86 (BP Location: Right Arm)   Pulse 73   Temp 98.3 F (36.8 C) (Oral)   Resp 20   SpO2 98%  CONSTITUTIONAL: Alert and oriented and responds appropriately to questions. Well-appearing; well-nourished, Elderly, in no distress, pain-free at this time, poor historian HEAD: Normocephalic EYES: Conjunctivae clear, pupils appear equal, EOMI ENT: normal nose; moist mucous membranes NECK: Supple, no meningismus, no nuchal rigidity, no LAD  CARD: RRR; S1 and S2 appreciated; no murmurs, no clicks, no rubs, no gallops RESP: Normal chest excursion without splinting or tachypnea; breath sounds clear and equal bilaterally; no wheezes, no rhonchi, no rales, no hypoxia or respiratory distress, speaking full sentences ABD/GI: Normal bowel sounds; non-distended; soft, non-tender, no rebound, no guarding, no peritoneal signs, no hepatosplenomegaly BACK:  The back appears normal and is non-tender to palpation, there is  no CVA tenderness EXT: Normal ROM in all joints; non-tender to palpation; no edema; normal capillary refill; no cyanosis, no calf tenderness or swelling    SKIN: Normal color for age and race; warm; no rash NEURO: Moves all extremities equally PSYCH: The patient's mood and manner are appropriate. Grooming and personal hygiene are appropriate.  MEDICAL DECISION MAKING: Patient here with exertional chest pain. Has multiple risk factors for ACS. Normal vital signs. No chest pain or shortness of breath this time. I feel it is unlikely dissection or PE. Will admit for ACS rule out. We'll give aspirin. Family comfortable with this plan.  ED PROGRESS:     12:26 AM Discussed patient's case with hospitalist, Dr. Maudie Mercury.  I have recommended admission and patient (and family if present) agree with this plan. Admitting physician will place admission orders.   I reviewed all nursing notes, vitals, pertinent previous records, EKGs, lab and urine results, imaging (  as available).     EKG Interpretation  Date/Time:  Monday May 29 2017 17:33:50 EDT Ventricular Rate:  69 PR Interval:  204 QRS Duration: 130 QT Interval:  426 QTC Calculation: 456 R Axis:   17 Text Interpretation:  Normal sinus rhythm Left bundle branch block Abnormal ECG No significant change since last tracing Confirmed by Ward, Cyril Mourning [phone]) on 05/30/2017 12:06:37 AM         Ward, Delice Bison, DO 05/30/17 4888

## 2017-05-30 NOTE — H&P (Signed)
TRH H&P   Patient Demographics:    Taji Barretto, is a 83 y.o. male  MRN: [id number]   DOB - 01/06/1931  Admit Date - 05/29/2017  Outpatient Primary MD for the patient is [name], MD  Referring MD/NP/PA: Erasmo Downer Ward  Outpatient Specialists:   Wille Glaser (pulmonary) Darlin Coco (cardiology) Glenetta Hew (cardiology)  Patient coming from: home=> office =>   Chief Complaint  Patient presents with  . Chest Pain      HPI:    Garvin Ellena  is a 83 y.o. male, w CVA, Hypertension, Hyperlipidemia, CHF (EF 40-45%) PVD, Copd, ILD, apparently c/o chest pain left sided without radiation occurring since this past Saturday, thought to be exertional. + cough, yellow sputum (chronic), denies fever, chills, palp, sob beyond his baseline, n/v, gerd.   Pt may have some dementia. Pt was seen by pcp today and sent to ER for evaluation.    In ED, EKG nsr at 70 , nl axis, LBBB, CXR => IMPRESSION: Fibrotic appearing interstitial coarsening without interval change. No consolidation or effusion.  Trop I 0.00,  Bun/creatinine 12/0.95,  Pt will be admitted for chest pain.  Currently chest pain free.       Review of systems:    In addition to the HPI above, No Fever-chills, No Headache, No changes with Vision or hearing, No problems swallowing food or Liquids, No Abdominal pain, No Nausea or Vommitting, Bowel movements are regular, No Blood in stool or Urine, No dysuria, No new skin rashes or bruises, No new joints pains-aches,  No new weakness, tingling, numbness in any extremity, No recent weight gain or loss, No polyuria, polydypsia or polyphagia, No significant Mental Stressors.  A full 10 point Review of Systems was done, except as stated above, all other Review of Systems were negative.   With Past History of the following :    Past Medical History:  Diagnosis  Date  . Allergic rhinitis   . Anxiety   . Basal cell carcinoma of face   . Cardiomyopathy ([place]) 04/2015   EF 40-45% by echo. Apical anterior akinesis seen on echo, not confirmed on Myoview was negative for ischemia. Myoview suggested a small basal inferoseptal defect.  . Cholecystitis    a. complex admit 04/2015 due to sepsis due to acute Escherichia coli cholecystitis and right lower lobe CAP and Citrobacter UTI.  [name] Chronic respiratory failure ([place])    a. on home O2 after discharge 04/2015.  [name] Chronic systolic CHF (congestive heart failure) ([place])    a. Dx 04/2015 during complex admission for cholecystitis - EF 40-45% +WMA.  [name] COPD (chronic obstructive pulmonary disease) ([place])   . Erectile dysfunction   . History of endovascular stent graft for abdominal aortic aneurysm 2002   a. s/p repair  Followed by Dr. Donnetta Hutching (stable evaluation 01/2106)  . History of hiatal hernia   . HTN (  hypertension)   . Hyperlipidemia   . Interstitial lung disease ([place])    a. followed by pulm.  [name] LBBB (left bundle branch block)    a. Intermittent - seen in 2013, not in 09/2013, but again in 04/2015.  . Obesity   . On home oxygen therapy    "just at night" (10/05/2015)  . PVD (peripheral vascular disease) ([place])   . Shingles   . Stroke [facility])    a. H/o aphasia - was told he'd had ministrokes. Imaging 09/2014 revealed prior infarct.      Past Surgical History:  Procedure Laterality Date  . AORTA SURGERY  2002  . CHOLECYSTECTOMY N/A 10/05/2015   Procedure: LAPAROSCOPIC CHOLECYSTECTOMY  ;  Surgeon: Autumn Messing III, MD;  Location: [place];  Service: General;  Laterality: N/A;  . [place]  . INTRAOPERATIVE CHOLANGIOGRAM  10/05/2015   Procedure: INTRAOPERATIVE CHOLANGIOGRAM;  Surgeon: Autumn Messing III, MD;  Location: [place];  Service: General;;  . LAPAROSCOPIC CHOLECYSTECTOMY  10/05/2015  . NM MYOVIEW LTD  05/2015   EF 49%. Small size, mild severity perfusion defect in the basal-mid inferoseptal wall. LOW RISK. No  ischemia. Defect thought to be related to LBBB artifacts and not true lesion. This could also explain mild systolic dysfunction.  . TRANSTHORACIC ECHOCARDIOGRAM  04/2015   (In setting of acute illness).EF 40-45% with mid-apical anteroseptal akinesis. Moderately increased PA pressures of roughly 48 mmHg.  [name] URETHRAL STRICTURE DILATATION  late 1970s   urinary tract stretch      Social History:     Social History  Substance Use Topics  . Smoking status: Former Smoker    Packs/day: 1.00    Years: 25.00    Types: Cigarettes    Quit date: 08/22/1974  . Smokeless tobacco: Never Used  . Alcohol use No     Lives - at home  Mobility - walks by self   Family History :     Family History  Problem Relation Age of Onset  . Hypertension Mother   . Stroke Father   . Heart disease Father   . Colon cancer Brother       Home Medications:   Prior to Admission medications   Medication Sig Start Date End Date Taking? Authorizing Provider  albuterol (PROVENTIL HFA;VENTOLIN HFA) 108 (90 BASE) MCG/ACT inhaler Inhale 2 puffs into the lungs every 6 (six) hours as needed for wheezing or shortness of breath. 05/05/15  Yes Short, Noah Delaine, MD  carvedilol (COREG) 12.5 MG tablet TAKE 1 TABLET (12.5 MG TOTAL) BY MOUTH 2 (TWO) TIMES DAILY WITH A MEAL. 08/29/16  Yes Leonie Man, MD  Cholecalciferol (VITAMIN D PO) Take 1 tablet by mouth daily.   Yes [provider]  clopidogrel (PLAVIX) 75 MG tablet Take 75 mg by mouth daily.  09/24/14  Yes [provider]  ezetimibe (ZETIA) 10 MG tablet Take 10 mg by mouth daily.    Yes [provider]  losartan (COZAAR) 50 MG tablet Take 50 mg by mouth daily.   Yes [provider]  Multiple Vitamin (MULTIVITAMIN WITH MINERALS) TABS tablet Take 1 tablet by mouth daily.   Yes [provider]  nitroGLYCERIN (NITROSTAT) 0.4 MG SL tablet Place 1 tablet (0.4 mg total) under the tongue every 5 (five) minutes as needed for chest  pain. 05/05/15  Yes Short, Noah Delaine, MD  pantoprazole (PROTONIX) 40 MG tablet Take 40 mg by mouth daily. 09/25/14  Yes [provider]  Pitavastatin Calcium (LIVALO)  2 MG TABS Take 2 mg by mouth See admin instructions. Every 3rd day   Yes [provider]  Probiotic Product ([place]) CAPS Take 1 capsule by mouth daily.   Yes [provider]  tiotropium (SPIRIVA) 18 MCG inhalation capsule Place 1 capsule (18 mcg total) into inhaler and inhale daily. 05/05/15  Yes Short, Noah Delaine, MD  cephALEXin (KEFLEX) 500 MG capsule Take 1 capsule (500 mg total) by mouth 3 (three) times daily. Patient not taking: Reported on 05/30/2017 01/23/17   Brand Males, MD     Allergies:     Allergies  Allergen Reactions  . Ciprofloxacin Other (See Comments)    Tingle feeling throughout body  . Tape Other (See Comments)    Redness, Please use "paper" tape     Physical Exam:   Vitals  Blood pressure (!) 155/71, pulse 66, temperature 98.3 F (36.8 C), temperature source Oral, resp. rate (!) 21, SpO2 97 %.   1. General  lying in bed in NAD,   2. Normal affect and insight, Not Suicidal or Homicidal, Awake Alert, Oriented X 3.  3. No F.N deficits, ALL C.Nerves Intact, Strength 5/5 all 4 extremities, Sensation intact all 4 extremities, Plantars down going.  4. Ears and Eyes appear Normal, Conjunctivae clear, PERRLA. Moist Oral Mucosa.  5. Supple Neck, No JVD, No cervical lymphadenopathy appriciated, No Carotid Bruits.  6. Symmetrical Chest wall movement, Good air movement bilaterally, CTAB.  7. RRR, No Gallops, Rubs or Murmurs, No Parasternal Heave.  8. Positive Bowel Sounds, Abdomen Soft, No tenderness, No organomegaly appriciated,No rebound -guarding or rigidity.  9.  No Cyanosis, Normal Skin Turgor, No Skin Rash or Bruise.  10. Good muscle tone,  joints appear normal , no effusions, Normal ROM.  11. No Palpable Lymph Nodes in Neck or Axillae     Data  Review:    CBC  Recent Labs Lab 05/29/17 2033  WBC 9.7  HGB 15.1  HCT 43.7  PLT 228  MCV 92.2  MCH 31.9  MCHC 34.6  RDW 13.3   ------------------------------------------------------------------------------------------------------------------  Chemistries   Recent Labs Lab 05/29/17 2033  NA 138  K 4.2  CL 106  CO2 22  GLUCOSE 102*  BUN 12  CREATININE 0.95  CALCIUM 9.4   ------------------------------------------------------------------------------------------------------------------ CrCl cannot be calculated (Unknown ideal weight.). ------------------------------------------------------------------------------------------------------------------ No results for input(s): TSH, T4TOTAL, T3FREE, THYROIDAB in the last 72 hours.  Invalid input(s): FREET3  Coagulation profile No results for input(s): INR, PROTIME in the last 168 hours. ------------------------------------------------------------------------------------------------------------------- No results for input(s): DDIMER in the last 72 hours. -------------------------------------------------------------------------------------------------------------------  Cardiac Enzymes No results for input(s): CKMB, TROPONINI, MYOGLOBIN in the last 168 hours.  Invalid input(s): CK ------------------------------------------------------------------------------------------------------------------    Component Value Date/Time   BNP 240.3 (H) 05/01/2015 0020     ---------------------------------------------------------------------------------------------------------------  Urinalysis    Component Value Date/Time   COLORURINE AMBER (A) 04/30/2015 2123   APPEARANCEUR CLOUDY (A) 04/30/2015 2123   LABSPEC 1.021 04/30/2015 2123   PHURINE 5.5 04/30/2015 2123   GLUCOSEU NEGATIVE 04/30/2015 2123   HGBUR NEGATIVE 04/30/2015 2123   BILIRUBINUR SMALL (A) 04/30/2015 2123   KETONESUR 15 (A) 04/30/2015 2123   PROTEINUR NEGATIVE  04/30/2015 2123   UROBILINOGEN 1.0 04/30/2015 2123   NITRITE NEGATIVE 04/30/2015 2123   LEUKOCYTESUR SMALL (A) 04/30/2015 2123    ----------------------------------------------------------------------------------------------------------------   Imaging Results:    Dg Chest 2 View  Result Date: 05/29/2017 CLINICAL DATA:  Left-sided chest pain for 3 days. Intermittent dyspnea. EXAM: CHEST  2 VIEW COMPARISON:  05/29/2015 FINDINGS: Shallow inspiration. Diffuse interstitial coarsening, unchanged and likely fibrotic. No airspace consolidation. No effusion. Normal pulmonary vasculature. Unchanged hilar, mediastinal and cardiac contours. IMPRESSION: Fibrotic appearing interstitial coarsening without interval change. No consolidation or effusion. Electronically Signed   By: Andreas Newport M.D.   On: 05/29/2017 18:27      Assessment & Plan:    Principal Problem:   Chest pain Active Problems:   IPF (idiopathic pulmonary fibrosis) (HCC)   Essential hypertension    Chest pain, exertional Tele Trop I q6h x3 Check hga1c, lipid,  Cardiac echo Nuclear stress test Aspirin, plavix, carvedilol, cozaar, pravastatin, zetia Cardiology consult by email, appreciate input  Hyeprtension Cont current bp medications  IPF Cont spiriva, albuterol      DVT Prophylaxis Lovenox - SCDs   AM Labs Ordered, also please review Full Orders  Family Communication: Admission, patients condition and plan of care including tests being ordered have been discussed with the patient  who indicate understanding and agree with the plan and Code Status.  Code Status FULL CODE  Likely DC to  home  Condition GUARDED    Consults called: cardiology by email  Admission status: observation  Time spent in minutes : 45    Jani Gravel M.D on 05/30/2017 at 12:23 AM  Between 7am to 7pm - Pager - [phone]   After 7pm go to [email or web address] - password [facility]  [facility] - Office  [phone]

## 2017-07-23 ENCOUNTER — Ambulatory Visit (HOSPITAL_COMMUNITY)
Admission: EM | Admit: 2017-07-23 | Discharge: 2017-07-23 | Disposition: A | Discharge status: Home / Self Care | Payer: Medicare Other

## 2017-07-23 ENCOUNTER — Ambulatory Visit (INDEPENDENT_AMBULATORY_CARE_PROVIDER_SITE_OTHER): Payer: Medicare Other

## 2017-07-23 DIAGNOSIS — J441 Chronic obstructive pulmonary disease with (acute) exacerbation: Secondary | ICD-10-CM

## 2017-07-23 MED ORDER — CEPHALEXIN 500 MG PO CAPS: 500.0000 mg | ORAL_CAPSULE | Freq: Four times a day (QID) | ORAL | 0 refills | Status: DC

## 2017-07-23 MED ORDER — CEPHALEXIN 500 MG PO CAPS: 500.0000 mg | ORAL_CAPSULE | Freq: Three times a day (TID) | ORAL | 0 refills | Status: DC

## 2017-07-23 NOTE — ED Notes (Signed)
Spoke with olivia blue, pa

## 2017-07-23 NOTE — ED Provider Notes (Signed)
[place]    CSN: [id number] Arrival date & time: 07/23/17  1553     History   Chief Complaint No chief complaint on file.   HPI Lee [name] is a 83 y.o. male.   HPI Patient comes in with family member today to be evaluated for increased productive cough 1 day. Aspirin history significant for cardiomyopathy, CHF, COPD, interstitial lung disease. Patient followed by pulmonologist Dr. Chase Caller and has appointment scheduled to see him tomorrow afternoon.    Cough with yellow sputum. He uses his inhalers daily. Patient's wife started given him Keflex yesterday which they usually keep on hand from primary care physician. No complaints of chest pain or shortness of breath. Past Medical History:  Diagnosis Date  . Allergic rhinitis   . Anxiety   . Basal cell carcinoma of face   . Cardiomyopathy ([place]) 04/2015   EF 40-45% by echo. Apical anterior akinesis seen on echo, not confirmed on Myoview was negative for ischemia. Myoview suggested a small basal inferoseptal defect.  . Cholecystitis    a. complex admit 04/2015 due to sepsis due to acute Escherichia coli cholecystitis and right lower lobe CAP and Citrobacter UTI.  [name] Chronic respiratory failure ([place])    a. on home O2 after discharge 04/2015.  [name] Chronic systolic CHF (congestive heart failure) ([place])    a. Dx 04/2015 during complex admission for cholecystitis - EF 40-45% +WMA.  [name] COPD (chronic obstructive pulmonary disease) ([place])   . Erectile dysfunction   . History of endovascular stent graft for abdominal aortic aneurysm 2002   a. s/p repair  Followed by Dr. Donnetta Hutching (stable evaluation 01/2106)  . History of hiatal hernia   . HTN (hypertension)   . Hyperlipidemia   . Interstitial lung disease ([place])    a. followed by pulm.  [name] LBBB (left bundle branch block)    a. Intermittent - seen in 2013, not in 09/2013, but again in 04/2015.  . Obesity   . On home oxygen therapy    "just at night" (10/05/2015)  . PVD (peripheral  vascular disease) ([place])   . Shingles   . Stroke [facility])    a. H/o aphasia - was told he'd had ministrokes. Imaging 09/2014 revealed prior infarct.    Patient Active Problem List   Diagnosis Date Noted  . Chest pain 05/30/2017  . Stroke ([place])   . UTI (urinary tract infection) 05/02/2015  . Cardiomyopathy, nonischemic (HCC) - EF 40-45% by echo, but nearly 50% by Myoview. Anterior akinesis seen on echo not seen on Myoview. 05/02/2015  . Sepsis ([place]) 04/30/2015  . Essential hypertension 04/30/2015  . Abdominal pain 04/30/2015  . Hyperlipidemia with target LDL less than 100 04/30/2015  . CAP (community acquired pneumonia)   . IPF (idiopathic pulmonary fibrosis) ([place]) 10/22/2014  . Chronic cough 10/22/2014  . GERD (gastroesophageal reflux disease) 02/05/2013  . Other emphysema ([place]) 10/23/2012  . Idiopathic pulmonary fibrosis, high clinical pretest probability, declined surgical lung biopsy 09/13/2012  . Dyspnea on exertion 08/03/2012  . Hypercholesterolemia 08/03/2012  . Benign hypertensive heart disease without heart failure 08/03/2012  . H/O abdominal aortic aneurysm repair 08/03/2012  . Ventral hernia 08/03/2012    Past Surgical History:  Procedure Laterality Date  . AORTA SURGERY  2002  . CHOLECYSTECTOMY N/A 10/05/2015   Procedure: LAPAROSCOPIC CHOLECYSTECTOMY  ;  Surgeon: Autumn Messing III, MD;  Location: [place];  Service: General;  Laterality: N/A;  . [place]  . INTRAOPERATIVE CHOLANGIOGRAM  10/05/2015   Procedure: INTRAOPERATIVE CHOLANGIOGRAM;  Surgeon: Autumn Messing III, MD;  Location: [place];  Service: General;;  . LAPAROSCOPIC CHOLECYSTECTOMY  10/05/2015  . NM MYOVIEW LTD  05/2015   EF 49%. Small size, mild severity perfusion defect in the basal-mid inferoseptal wall. LOW RISK. No ischemia. Defect thought to be related to LBBB artifacts and not true lesion. This could also explain mild systolic dysfunction.  . TRANSTHORACIC ECHOCARDIOGRAM  04/2015   (In setting of acute  illness).EF 40-45% with mid-apical anteroseptal akinesis. Moderately increased PA pressures of roughly 48 mmHg.  [name] URETHRAL STRICTURE DILATATION  late 1970s   urinary tract stretch       Home Medications    Prior to Admission medications   Medication Sig Start Date End Date Taking? Authorizing Provider  Acetaminophen (TYLENOL PO) Take 600 mg by mouth.   Yes [provider]  albuterol (PROVENTIL HFA;VENTOLIN HFA) 108 (90 BASE) MCG/ACT inhaler Inhale 2 puffs into the lungs every 6 (six) hours as needed for wheezing or shortness of breath. 05/05/15   Janece Canterbury, MD  carvedilol (COREG) 12.5 MG tablet TAKE 1 TABLET (12.5 MG TOTAL) BY MOUTH 2 (TWO) TIMES DAILY WITH A MEAL. 08/29/16   Leonie Man, MD  cephALEXin (KEFLEX) 500 MG capsule Take 1 capsule (500 mg total) by mouth 3 (three) times daily. 07/23/17   Lanae Crumbly, PA-C  Cholecalciferol (VITAMIN D PO) Take 1 tablet by mouth daily.    [provider]  clopidogrel (PLAVIX) 75 MG tablet Take 75 mg by mouth daily.  09/24/14   [provider]  ezetimibe (ZETIA) 10 MG tablet Take 10 mg by mouth daily.     [provider]  losartan (COZAAR) 50 MG tablet Take 50 mg by mouth daily.    [provider]  Multiple Vitamin (MULTIVITAMIN WITH MINERALS) TABS tablet Take 1 tablet by mouth daily.    [provider]  nitroGLYCERIN (NITROSTAT) 0.4 MG SL tablet Place 1 tablet (0.4 mg total) under the tongue every 5 (five) minutes as needed for chest pain. 05/05/15   Janece Canterbury, MD  pantoprazole (PROTONIX) 40 MG tablet Take 40 mg by mouth daily. 09/25/14   [provider]  Pitavastatin Calcium (LIVALO) 2 MG TABS Take 2 mg by mouth See admin instructions. Every 3rd day    [provider]  Probiotic Product ([place]) CAPS Take 1 capsule by mouth daily.    [provider]  tiotropium (SPIRIVA) 18 MCG inhalation capsule Place 1 capsule (18 mcg total) into inhaler  and inhale daily. 05/05/15   Janece Canterbury, MD    Family History Family History  Problem Relation Age of Onset  . Hypertension Mother   . Stroke Father   . Heart disease Father   . Colon cancer Brother     Social History Social History   Tobacco Use  . Smoking status: Former Smoker    Packs/day: 1.00    Years: 25.00    Pack years: 25.00    Types: Cigarettes    Last attempt to quit: 08/22/1974    Years since quitting: 42.9  . Smokeless tobacco: Never Used  Substance Use Topics  . Alcohol use: No  . Drug use: No     Allergies   Ciprofloxacin and Tape   Review of Systems Review of Systems   Physical Exam Triage Vital Signs ED Triage Vitals  Enc Vitals Group     BP 07/23/17 1611 125/60  Pulse Rate 07/23/17 1611 86     Resp 07/23/17 1611 (!) 28     Temp 07/23/17 1611 99.1 F (37.3 C)     Temp Source 07/23/17 1611 Oral     SpO2 07/23/17 1611 96 %     Weight --      Height --      Head Circumference --      Peak Flow --      Pain Score 07/23/17 1607 4     Pain Loc --      Pain Edu? --      Excl. in [place]? --    No data found.  Updated Vital Signs BP 125/60 (BP Location: Right Arm)   Pulse 86   Temp 99.1 F (37.3 C) (Oral)   Resp (!) 28   SpO2 96%   Visual Acuity Right Eye Distance:   Left Eye Distance:   Bilateral Distance:    Right Eye Near:   Left Eye Near:    Bilateral Near:     Physical Exam   UC Treatments / Results  Labs (all labs ordered are listed, but only abnormal results are displayed) Labs Reviewed - No data to display  EKG  EKG Interpretation None       Radiology Dg Chest 2 View  Result Date: 07/23/2017 CLINICAL DATA:  Coughing since yesterday, yellow mucus. Laying down makes coughing worse. Denies N/V, pain, and dizziness. Hx of COPD. No known heart conditions, Ex smoker 48 years ago. Nondiabetic. EXAM: CHEST  2 VIEW COMPARISON:  05/29/2017 FINDINGS: Cardiac silhouette is normal in size and configuration. No  mediastinal or hilar masses. No convincing adenopathy. Irregular interstitial thickening in the lungs is stable, most prominent at the lung bases, consistent with fibrosis. There is no evidence of pneumonia or pulmonary edema. No pleural effusion or pneumothorax. Skeletal structures are demineralized but grossly intact. IMPRESSION: 1. No acute cardiopulmonary disease. 2. Chronic lung findings consistent with interstitial fibrosis. Electronically Signed   By: Lajean Manes M.D.   On: 07/23/2017 17:47    Procedures Procedures (including critical care time)  Medications Ordered in UC Medications - No data to display   Initial Impression / Assessment and Plan / UC Course  I have reviewed the triage vital signs and the nursing notes.  Pertinent labs & imaging results that were available during my care of the patient were reviewed by me and considered in my medical decision making (see chart for details).       Final Clinical Impressions(s) / UC Diagnoses   Final diagnoses:  COPD exacerbation [facility])    ED Discharge Orders        Ordered    cephALEXin (KEFLEX) 500 MG capsule  4 times daily,   Status:  Discontinued     07/23/17 1824    cephALEXin (KEFLEX) 500 MG capsule  3 times daily     07/23/17 1824     Patient will keep appointment with Dr. Chase Caller tomorrow as scheduled. We'll continue Keflex 500 mg by mouth 3 times a day. I was planning on giving patient a prescription for Mucinex or Delsym after reading the literature it states that this is contraindicated with his medical issues. If symptoms worsen he must go to the emergency room immediately.  Controlled Substance Prescriptions [place] Controlled Substance Registry consulted? Not Applicable   Lanae Crumbly, PA-C 07/23/17 1829

## 2017-07-23 NOTE — ED Triage Notes (Signed)
Yesterday with coughing that got worse, yellow mucus, per pt he has COPD, per pt wife

## 2017-07-23 NOTE — Discharge Instructions (Signed)
Keep appointment scheduled tomorrow with Dr. Chase Caller pulmonologist  If your symptoms worsen before appointment you must go to the emergency room immediately.

## 2017-07-24 ENCOUNTER — Encounter: Payer: Self-pay | Admitting: Internal Medicine

## 2017-07-24 ENCOUNTER — Ambulatory Visit: Payer: Medicare Other | Admitting: Internal Medicine

## 2017-07-24 VITALS — BP 104/56 | HR 85 | Ht 66.0 in | Wt 202.8 lb

## 2017-07-24 DIAGNOSIS — J8410 Pulmonary fibrosis, unspecified: Secondary | ICD-10-CM

## 2017-07-24 DIAGNOSIS — J209 Acute bronchitis, unspecified: Secondary | ICD-10-CM

## 2017-07-24 DIAGNOSIS — J438 Other emphysema: Secondary | ICD-10-CM

## 2017-07-24 DIAGNOSIS — J440 Chronic obstructive pulmonary disease with acute lower respiratory infection: Secondary | ICD-10-CM | POA: Diagnosis not present

## 2017-07-24 DIAGNOSIS — J439 Emphysema, unspecified: Secondary | ICD-10-CM | POA: Diagnosis not present

## 2017-07-24 DIAGNOSIS — J84112 Idiopathic pulmonary fibrosis: Secondary | ICD-10-CM

## 2017-07-24 NOTE — Progress Notes (Signed)
Subjective:     Patient ID: Lee [name], male   DOB: 01/26/1931, 83 y.o.   MRN: [id number]  HPI  OV 07/25/2016  Chief Complaint  Patient presents with  . Follow-up    pt states his breathing is unchanged since last OV. Pt c/o prod cough with light yellow mucus - unchanged since last OV. Pt denies CP./tightness.     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 In the interim he's had his cataract surgery uneventfully. He does not like to take medications. In the past he has decline anti-fibrotic therapy. He presents with his wife as usual. Overall pulmonary health is stable. It appears that he has some memory issues and is going to be started on exelon Patch. He uses nocturnal 02. He is on spiriva though not on MAR. Wife says he is compliant with it. He is wondring of about portable o2 for travel . Walking desat test 07/25/16 - 185 feet x 3 laps on RA: slow walk and fatigued. Lowest pulse ox 94% on forehead prlbe  Upteodate with flu shot +    OV 01/23/2017  Chief Complaint  Patient presents with  . Follow-up    Pt states his breathing is unchanged since last OV. Pt c/o prod cough with light yellow mucus. Pt denies CP/tightness and f/c/s.     follow-up combined emphysema with intestitial lung disease initial diagnosis of IPF   this is a six-month follow-up. He is not on antibiotic terapy. He is only on Spiriva. His wife reports for the last 1 month is increased chest tightness and congestion with increased yellow sputum. But he is denying any problems. This no wheezing. Both of them agree that there is no increased wheezing. They're tryingto make him walk a little bit more than usual.     OV 07/24/2017  Chief Complaint  Patient presents with  . Follow-up    Pt's wife stated that pt had bronchitis. Pt went to the ED last night 07/23/17 and dx was COPD. Pt's wife states that she believes he had a UTI due to  running a fever. Pt is currently on cephalexin. Pt's wife states that today is a much better day than yesterday due to pt unable to walk yesterday at the ED   FU combined emphysema/fibrosis  Follow-up combined emphysema pulmonary fibrosis on supportive care. Wife is here with him. For the last 2-3 days he's had increased cough with congestion and yellow sputum worse than baseline with deep cough. There is no increased wheezing or shortness of breath. Associated with this she started developing jerky left leg and pain which usually reflects associated urinary tract infection. Went to the emergency room yesterday was started on cephalexin. It is also having some delirium. He is now completely improved. Slowly getting better. Wife does not think there is a role for prednisone. He is up-to-date with his flu shot.    has a past medical history of Allergic rhinitis, Anxiety, Basal cell carcinoma of face, Cardiomyopathy ([place]) (04/2015), Cholecystitis, Chronic respiratory failure (HCC), Chronic systolic CHF (congestive heart failure) ([place]), COPD (chronic obstructive pulmonary disease) ([place]), Erectile dysfunction, History of endovascular stent graft for abdominal aortic aneurysm (2002), History of hiatal hernia, HTN (hypertension), Hyperlipidemia, Interstitial lung disease ([place]), LBBB (left bundle branch block), Obesity, On home oxygen therapy, PVD (peripheral vascular disease) ([place]), Shingles, and Stroke ([place]).   reports that he quit smoking about 42 years ago. His smoking  use included cigarettes. He has a 25.00 pack-year smoking history. he has never used smokeless tobacco.  Past Surgical History:  Procedure Laterality Date  . AORTA SURGERY  2002  . CHOLECYSTECTOMY N/A 10/05/2015   Procedure: LAPAROSCOPIC CHOLECYSTECTOMY  ;  Surgeon: Autumn Messing III, MD;  Location: [place];  Service: General;  Laterality: N/A;  . [place]  . INTRAOPERATIVE CHOLANGIOGRAM  10/05/2015   Procedure: INTRAOPERATIVE  CHOLANGIOGRAM;  Surgeon: Autumn Messing III, MD;  Location: [place];  Service: General;;  . LAPAROSCOPIC CHOLECYSTECTOMY  10/05/2015  . NM MYOVIEW LTD  05/2015   EF 49%. Small size, mild severity perfusion defect in the basal-mid inferoseptal wall. LOW RISK. No ischemia. Defect thought to be related to LBBB artifacts and not true lesion. This could also explain mild systolic dysfunction.  . TRANSTHORACIC ECHOCARDIOGRAM  04/2015   (In setting of acute illness).EF 40-45% with mid-apical anteroseptal akinesis. Moderately increased PA pressures of roughly 48 mmHg.  [name] URETHRAL STRICTURE DILATATION  late 1970s   urinary tract stretch    Allergies  Allergen Reactions  . Ciprofloxacin Other (See Comments)    Tingle feeling throughout body  . Tape Other (See Comments)    Redness, Please use "paper" tape    Immunization History  Administered Date(s) Administered  . Influenza Split 06/22/2012, 06/22/2013, 06/03/2015  . Influenza, High Dose Seasonal PF 04/22/2016, 05/03/2017  . Influenza-Unspecified 06/22/2014  . Pneumococcal Polysaccharide-23 06/22/2010  . Tdap 09/22/2012    Family History  Problem Relation Age of Onset  . Hypertension Mother   . Stroke Father   . Heart disease Father   . Colon cancer Brother      Current Outpatient Medications:  .  Acetaminophen (TYLENOL PO), Take 600 mg by mouth., Disp: , Rfl:  .  albuterol (PROVENTIL HFA;VENTOLIN HFA) 108 (90 BASE) MCG/ACT inhaler, Inhale 2 puffs into the lungs every 6 (six) hours as needed for wheezing or shortness of breath., Disp: 1 Inhaler, Rfl: 0 .  carvedilol (COREG) 12.5 MG tablet, TAKE 1 TABLET (12.5 MG TOTAL) BY MOUTH 2 (TWO) TIMES DAILY WITH A MEAL., Disp: 180 tablet, Rfl: 3 .  cephALEXin (KEFLEX) 500 MG capsule, Take 1 capsule (500 mg total) by mouth 3 (three) times daily., Disp: 30 capsule, Rfl: 0 .  Cholecalciferol (VITAMIN D PO), Take 1 tablet by mouth daily., Disp: , Rfl:  .  clopidogrel (PLAVIX) 75 MG tablet, Take 75 mg by  mouth daily. , Disp: , Rfl: 6 .  ezetimibe (ZETIA) 10 MG tablet, Take 10 mg by mouth daily. , Disp: , Rfl:  .  losartan (COZAAR) 50 MG tablet, Take 50 mg by mouth daily., Disp: , Rfl:  .  Multiple Vitamin (MULTIVITAMIN WITH MINERALS) TABS tablet, Take 1 tablet by mouth daily., Disp: , Rfl:  .  nitroGLYCERIN (NITROSTAT) 0.4 MG SL tablet, Place 1 tablet (0.4 mg total) under the tongue every 5 (five) minutes as needed for chest pain., Disp: 30 tablet, Rfl: 0 .  pantoprazole (PROTONIX) 40 MG tablet, Take 40 mg by mouth daily., Disp: , Rfl: 11 .  Pitavastatin Calcium (LIVALO) 2 MG TABS, Take 2 mg by mouth See admin instructions. Every 3rd day, Disp: , Rfl:  .  Probiotic Product ([place]) CAPS, Take 1 capsule by mouth daily., Disp: , Rfl:  .  Tiotropium Bromide-Olodaterol (STIOLTO RESPIMAT) 2.5-2.5 MCG/ACT AERS, Inhale into the lungs., Disp: , Rfl:   Review of Systems     Objective:   Physical Exam  Constitutional: He is oriented to person, place, and time. He appears well-developed and well-nourished. No distress.  HENT:  Head: Normocephalic and atraumatic.  Right Ear: External ear normal.  Left Ear: External ear normal.  Mouth/Throat: Oropharynx is clear and moist. No oropharyngeal exudate.  Eyes: Conjunctivae and EOM are normal. Pupils are equal, round, and reactive to light. Right eye exhibits no discharge. Left eye exhibits no discharge. No scleral icterus.  Neck: Normal range of motion. Neck supple. No JVD present. No tracheal deviation present. No thyromegaly present.  Cardiovascular: Normal rate, regular rhythm and intact distal pulses. Exam reveals no gallop and no friction rub.  No murmur heard. Pulmonary/Chest: Effort normal. No respiratory distress. He has no wheezes. He has rales. He exhibits no tenderness.  Abdominal: Soft. Bowel sounds are normal. He exhibits no distension and no mass. There is no tenderness. There is no rebound and no guarding.  Musculoskeletal: He  exhibits no edema or tenderness.  Slow gait and uses a cane  Lymphadenopathy:    He has no cervical adenopathy.  Neurological: He is alert and oriented to person, place, and time. He has normal reflexes. No cranial nerve deficit. Coordination normal.  Skin: Skin is warm and dry. No rash noted. He is not diaphoretic. No erythema. No pallor.  Psychiatric: Judgment and thought content normal.  Memory loss  Nursing note and vitals reviewed.  Vitals:   07/24/17 1337  BP: (!) 104/56  Pulse: 85  SpO2: 96%  Weight: 202 lb 12.8 oz (92 kg)  Height: 5\' 6"  (1.676 m)    Estimated body mass index is 32.73 kg/m as calculated from the following:   Height as of this encounter: 5\' 6"  (1.676 m).   Weight as of this encounter: 202 lb 12.8 oz (92 kg).      Assessment:       ICD-10-CM   1. Acute bronchitis with COPD ([place]) J44.0    J20.9   2. Pulmonary emphysema with fibrosis of lung ([place]) J43.9    J84.10   3. IPF (idiopathic pulmonary fibrosis) ([place]) J84.112   4. Other emphysema ([place]) J43.8        Plan:       Glad you are getting better from acute bronchitis/uti with cephalexin  Continue cephalexin and hydration Will hold off prednisone burst  Followup 6 months or sooner if needed   Dr. Brand Males, M.D., [facility].C.P Pulmonary and Critical Care Medicine Staff Physician, [place] Director - Interstitial Lung Disease  Program  Pulmonary [place] [facility] [place], [place], [address]  Pager: [phone], If no answer or between  15:00h - 7:00h: call [phone] Telephone: [phone]

## 2017-07-24 NOTE — Patient Instructions (Signed)
ICD-10-CM   1. Acute bronchitis with COPD ([place]) J44.0    J20.9   2. Pulmonary emphysema with fibrosis of lung ([place]) J43.9    J84.10   3. IPF (idiopathic pulmonary fibrosis) ([place]) J84.112   4. Other emphysema ([place]) J43.8     Glad you are getting better from acute bronchitis/uti with cephalexin  Continue cephalexin and hydration Will hold off prednisone burst  Followup 6 months or sooner if needed

## 2017-07-28 ENCOUNTER — Ambulatory Visit: Payer: Medicare Other | Admitting: Pulmonary Disease

## 2017-07-28 ENCOUNTER — Ambulatory Visit (INDEPENDENT_AMBULATORY_CARE_PROVIDER_SITE_OTHER)
Admission: RE | Admit: 2017-07-28 | Discharge: 2017-07-28 | Disposition: A | Discharge status: Home / Self Care | Payer: Medicare Other | Source: Ambulatory Visit | Attending: Pulmonary Disease | Admitting: Pulmonary Disease

## 2017-07-28 ENCOUNTER — Encounter: Payer: Self-pay | Admitting: Pulmonary Disease

## 2017-07-28 VITALS — BP 118/60 | HR 72 | Ht 66.0 in | Wt 201.0 lb

## 2017-07-28 DIAGNOSIS — J438 Other emphysema: Secondary | ICD-10-CM | POA: Diagnosis not present

## 2017-07-28 MED ORDER — PREDNISONE 10 MG PO TABS: ORAL_TABLET | ORAL | 0 refills | Status: DC

## 2017-07-28 NOTE — Patient Instructions (Signed)
We will get a chest x-ray today Finish the antibiotics as prescribed Use Mucinex DM over-the-counter twice daily We will give a prednisone taper starting at 40 mg.  Reduce dose by 10 mg every 3 days.

## 2017-07-28 NOTE — Progress Notes (Signed)
Lee [name]    [id number]    October 31, 1930  Primary Care Physician:Perini, Elta Guadeloupe, MD  Referring Physician: [name], [address] [place], [place] [address]  Chief complaint: Acute visit for cough, sputum  HPI: 83 year old with COPD, IPF.  Not on anti-fibrotic therapy Complains of 1 week of cough with yellow sputum, dyspnea, wheezing.  He is on Keflex for UTI for the past 5 days He was evaluated by Dr. Chase Caller on 12/3 at which point he appears to be improving He returns to the clinic today with worsening symptoms of cough, wheezing.  Denies any fevers, chills.  Outpatient Encounter Medications as of 07/28/2017  Medication Sig  . Acetaminophen (TYLENOL PO) Take 600 mg by mouth.  [name] albuterol (PROVENTIL HFA;VENTOLIN HFA) 108 (90 BASE) MCG/ACT inhaler Inhale 2 puffs into the lungs every 6 (six) hours as needed for wheezing or shortness of breath.  . carvedilol (COREG) 12.5 MG tablet TAKE 1 TABLET (12.5 MG TOTAL) BY MOUTH 2 (TWO) TIMES DAILY WITH A MEAL.  . cephALEXin (KEFLEX) 500 MG capsule Take 1 capsule (500 mg total) by mouth 3 (three) times daily.  . Cholecalciferol (VITAMIN D PO) Take 1 tablet by mouth daily.  . clopidogrel (PLAVIX) 75 MG tablet Take 75 mg by mouth daily.   [name] ezetimibe (ZETIA) 10 MG tablet Take 10 mg by mouth daily.   [name] losartan (COZAAR) 50 MG tablet Take 50 mg by mouth daily.  . Multiple Vitamin (MULTIVITAMIN WITH MINERALS) TABS tablet Take 1 tablet by mouth daily.  . nitroGLYCERIN (NITROSTAT) 0.4 MG SL tablet Place 1 tablet (0.4 mg total) under the tongue every 5 (five) minutes as needed for chest pain.  . pantoprazole (PROTONIX) 40 MG tablet Take 40 mg by mouth daily.  . Pitavastatin Calcium (LIVALO) 2 MG TABS Take 2 mg by mouth See admin instructions. Every 3rd day  . Probiotic Product ([place]) CAPS Take 1 capsule by mouth daily.  . Tiotropium Bromide-Olodaterol (STIOLTO RESPIMAT) 2.5-2.5 MCG/ACT AERS Inhale into the lungs.    No facility-administered encounter medications on file as of 07/28/2017.     Allergies as of 07/28/2017 - Review Complete 07/28/2017  Allergen Reaction Noted  . Ciprofloxacin Other (See Comments) 08/07/2012  . Tape Other (See Comments) 09/23/2015    Past Medical History:  Diagnosis Date  . Allergic rhinitis   . Anxiety   . Basal cell carcinoma of face   . Cardiomyopathy ([place]) 04/2015   EF 40-45% by echo. Apical anterior akinesis seen on echo, not confirmed on Myoview was negative for ischemia. Myoview suggested a small basal inferoseptal defect.  . Cholecystitis    a. complex admit 04/2015 due to sepsis due to acute Escherichia coli cholecystitis and right lower lobe CAP and Citrobacter UTI.  [name] Chronic respiratory failure ([place])    a. on home O2 after discharge 04/2015.  [name] Chronic systolic CHF (congestive heart failure) ([place])    a. Dx 04/2015 during complex admission for cholecystitis - EF 40-45% +WMA.  [name] COPD (chronic obstructive pulmonary disease) ([place])   . Erectile dysfunction   . History of endovascular stent graft for abdominal aortic aneurysm 2002   a. s/p repair  Followed by Dr. Donnetta Hutching (stable evaluation 01/2106)  . History of hiatal hernia   . HTN (hypertension)   . Hyperlipidemia   . Interstitial lung disease ([place])    a. followed by pulm.  [name] LBBB (left bundle branch block)    a.  Intermittent - seen in 2013, not in 09/2013, but again in 04/2015.  . Obesity   . On home oxygen therapy    "just at night" (10/05/2015)  . PVD (peripheral vascular disease) ([place])   . Shingles   . Stroke [facility])    a. H/o aphasia - was told he'd had ministrokes. Imaging 09/2014 revealed prior infarct.    Past Surgical History:  Procedure Laterality Date  . AORTA SURGERY  2002  . CHOLECYSTECTOMY N/A 10/05/2015   Procedure: LAPAROSCOPIC CHOLECYSTECTOMY  ;  Surgeon: Autumn Messing III, MD;  Location: [place];  Service: General;  Laterality: N/A;  . [place]  . INTRAOPERATIVE CHOLANGIOGRAM   10/05/2015   Procedure: INTRAOPERATIVE CHOLANGIOGRAM;  Surgeon: Autumn Messing III, MD;  Location: [place];  Service: General;;  . LAPAROSCOPIC CHOLECYSTECTOMY  10/05/2015  . NM MYOVIEW LTD  05/2015   EF 49%. Small size, mild severity perfusion defect in the basal-mid inferoseptal wall. LOW RISK. No ischemia. Defect thought to be related to LBBB artifacts and not true lesion. This could also explain mild systolic dysfunction.  . TRANSTHORACIC ECHOCARDIOGRAM  04/2015   (In setting of acute illness).EF 40-45% with mid-apical anteroseptal akinesis. Moderately increased PA pressures of roughly 48 mmHg.  [name] URETHRAL STRICTURE DILATATION  late 1970s   urinary tract stretch    Family History  Problem Relation Age of Onset  . Hypertension Mother   . Stroke Father   . Heart disease Father   . Colon cancer Brother     Social History   Socioeconomic History  . Marital status: Married    Spouse name: Not on file  . Number of children: Not on file  . Years of education: Not on file  . Highest education level: Not on file  Social Needs  . Financial resource strain: Not on file  . Food insecurity - worry: Not on file  . Food insecurity - inability: Not on file  . Transportation needs - medical: Not on file  . Transportation needs - non-medical: Not on file  Occupational History  . Occupation: retired  Tobacco Use  . Smoking status: Former Smoker    Packs/day: 1.00    Years: 25.00    Pack years: 25.00    Types: Cigarettes    Last attempt to quit: 08/22/1974    Years since quitting: 42.9  . Smokeless tobacco: Never Used  Substance and Sexual Activity  . Alcohol use: No  . Drug use: No  . Sexual activity: Not on file  Other Topics Concern  . Not on file  Social History Narrative  . Not on file    Review of systems: Review of Systems  Constitutional: Negative for fever and chills.  HENT: Negative.   Eyes: Negative for blurred vision.  Respiratory: as per HPI  Cardiovascular: Negative for  chest pain and palpitations.  Gastrointestinal: Negative for vomiting, diarrhea, blood per rectum. Genitourinary: Negative for dysuria, urgency, frequency and hematuria.  Musculoskeletal: Negative for myalgias, back pain and joint pain.  Skin: Negative for itching and rash.  Neurological: Negative for dizziness, tremors, focal weakness, seizures and loss of consciousness.  Endo/Heme/Allergies: Negative for environmental allergies.  Psychiatric/Behavioral: Negative for depression, suicidal ideas and hallucinations.  All other systems reviewed and are negative.  Physical Exam: Blood pressure 118/60, pulse 72, height 5\' 6"  (1.676 m), weight 201 lb (91.2 kg), SpO2 96 %. Gen:      No acute distress HEENT:  EOMI, sclera anicteric Neck:  No masses; no thyromegaly Lungs:    Clear to auscultation bilaterally; normal respiratory effort CV:         Regular rate and rhythm; no murmurs Abd:      + bowel sounds; soft, non-tender; no palpable masses, no distension Ext:    No edema; adequate peripheral perfusion Skin:      Warm and dry; no rash Neuro: alert and oriented x 3 Psych: normal mood and affect  Data Reviewed: Chest x-ray 07/23/17-no acute lung disease.  Chronic interstitial fibrosis CT scan 09/17/12- moderate emphysema, peripheral and basal reticulation, groundglass and bronchiectasis.  No honeycombing I reviewed all images personally.  PFTs 04/18/14 FVC 2.11 (98%], FEV1 2.37 (108%], F/F 76, TLC 129%, DLCO 58% Minimal obstruction with moderate diffusion defect  Assessment:  COPD with exacerbation He is finishing up Keflex with 2 more days of therapy.  I do not think he requires additional antibiotic therapy as sputum color is improving and there are no fevers or chills.  As he continues to be symptomatic with dyspnea, wheezing will call in a prednisone taper. He will use Mucinex over-the-counter for clearance of secretion. Get chest x-ray  IPF Follow with Dr.  Chase Caller  Plan/Recommendations: - Chest x-ray - Finish Keflex - Use Mucinex - Prednisone taper  Marshell Garfinkel MD [facility] Pager [phone] 07/28/2017, 2:31 PM  CC: [name], MD

## 2017-08-03 ENCOUNTER — Telehealth: Payer: Self-pay | Admitting: Pulmonary Disease

## 2017-08-03 NOTE — Telephone Encounter (Signed)
It should be Ok to take both as he is taking the prednisone for a short period only

## 2017-08-03 NOTE — Telephone Encounter (Signed)
Notes recorded by Marshell Garfinkel, MD on 07/28/2017 at 4:38 PM EST Please let the patient know that chest x-ray shows pulmonary fibrosis. There is no new infiltrate or evidence of pneumonia.       Advised pt's wife of results. She understood. She had a question about the Plavix interfering with the prednisone. PM do you think this is something to be concerned about? She read this on the sheet from the pharmacy. Please advise.

## 2017-08-03 NOTE — Telephone Encounter (Signed)
Called and spoke with pts wife and she is aware of PM recs.  Nothing further is needed.

## 2017-08-20 ENCOUNTER — Other Ambulatory Visit: Payer: Self-pay | Admitting: Cardiology

## 2018-01-18 ENCOUNTER — Other Ambulatory Visit (HOSPITAL_COMMUNITY): Payer: Self-pay | Admitting: Internal Medicine

## 2018-01-18 DIAGNOSIS — R1310 Dysphagia, unspecified: Secondary | ICD-10-CM

## 2018-01-25 ENCOUNTER — Encounter: Payer: Self-pay | Admitting: Internal Medicine

## 2018-01-25 ENCOUNTER — Ambulatory Visit: Payer: Medicare Other | Admitting: Internal Medicine

## 2018-01-25 VITALS — BP 116/60 | HR 82 | Ht 66.0 in | Wt 204.2 lb

## 2018-01-25 DIAGNOSIS — J8410 Pulmonary fibrosis, unspecified: Secondary | ICD-10-CM

## 2018-01-25 DIAGNOSIS — J439 Emphysema, unspecified: Secondary | ICD-10-CM

## 2018-01-25 MED ORDER — TIOTROPIUM BROMIDE MONOHYDRATE 2.5 MCG/ACT IN AERS: 2.0000 | INHALATION_SPRAY | Freq: Every day | RESPIRATORY_TRACT | 0 refills | Status: DC

## 2018-01-25 NOTE — Progress Notes (Signed)
Subjective:     Patient ID: Lee [name], male   DOB: 07-23-1931, 84 y.o.   MRN: [id number]  HPI  OV 07/25/2016  Chief Complaint  Patient presents with  . Follow-up    pt states his breathing is unchanged since last OV. Pt c/o prod cough with light yellow mucus - unchanged since last OV. Pt denies CP./tightness.     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 In the interim he's had his cataract surgery uneventfully. He does not like to take medications. In the past he has decline anti-fibrotic therapy. He presents with his wife as usual. Overall pulmonary health is stable. It appears that he has some memory issues and is going to be started on exelon Patch. He uses nocturnal 02. He is on spiriva though not on MAR. Wife says he is compliant with it. He is wondring of about portable o2 for travel . Walking desat test 07/25/16 - 185 feet x 3 laps on RA: slow walk and fatigued. Lowest pulse ox 94% on forehead prlbe  Upteodate with flu shot +    OV 01/23/2017  Chief Complaint  Patient presents with  . Follow-up    Pt states his breathing is unchanged since last OV. Pt c/o prod cough with light yellow mucus. Pt denies CP/tightness and f/c/s.     follow-up combined emphysema with intestitial lung disease initial diagnosis of IPF   this is a six-month follow-up. He is not on antibiotic terapy. He is only on Spiriva. His wife reports for the last 1 month is increased chest tightness and congestion with increased yellow sputum. But he is denying any problems. This no wheezing. Both of them agree that there is no increased wheezing. They're tryingto make him walk a little bit more than usual.     OV 07/24/2017  Chief Complaint  Patient presents with  . Follow-up    Pt's wife stated that pt had bronchitis. Pt went to the ED last night 07/23/17 and dx was COPD. Pt's wife states that she believes he had a UTI due to  running a fever. Pt is currently on cephalexin. Pt's wife states that today is a much better day than yesterday due to pt unable to walk yesterday at the ED   FU combined emphysema/fibrosis  Follow-up combined emphysema pulmonary fibrosis on supportive care. Wife is here with him. For the last 2-3 days he's had increased cough with congestion and yellow sputum worse than baseline with deep cough. There is no increased wheezing or shortness of breath. Associated with this she started developing jerky left leg and pain which usually reflects associated urinary tract infection. Went to the emergency room yesterday was started on cephalexin. It is also having some delirium. He is now completely improved. Slowly getting better. Wife does not think there is a role for prednisone. He is up-to-date with his flu shot.   OV 01/25/18  Chief Complaint  Patient presents with  . Follow-up    per wife increased wheezing, has not been using inhalers. Reports cough is minimal.     Follow-up combined emphysema with pulmonary fibrosis on basic supportive care for pulmonary fibrosis  Wife is here with him.  I personally not seen him in a year.  6 months ago he saw my colleague Dr. Vaughan Browner.  Wife tells me that over time he is insidious worsening of cough with wheezing and mucus production that is clear  to yellow.  Shortness of breath could be worse but then he is also more deconditioned is needing a cane and is needing significant assistance at home.  They are going to get a caretaker come in and visit with him 4 times a week.  There is no acute decompensation or subacute decompensation.  COPD CAT score is 23 showing significant amount of symptomatology.  He has also had new onset dysphagia for the last few to several months.  He is having a swallow study ordered by primary care physician.  Wife is wondering whether he should restart himself on inhalers.  Apparently Spiriva worked in the past.  He is more in favor of  inhalers as opposed to nebulizers which take time and is of increased frequency.    CAT COPD Symptom & Quality of Life Score (GSK trademark) 0 is no burden. 5 is highest burden 01/25/2018   Never Cough -> Cough all the time 4  No phlegm in chest -> Chest is full of phlegm 3  No chest tightness -> Chest feels very tight 0  No dyspnea for 1 flight stairs/hill -> Very dyspneic for 1 flight of stairs 4  No limitations for ADL at home -> Very limited with ADL at home 5  Confident leaving home -> Not at all confident leaving home 2  Sleep soundly -> Do not sleep soundly because of lung condition 1  Lots of Energy -> No energy at all 4  TOTAL Score (max 40)  23           has a past medical history of Allergic rhinitis, Anxiety, Basal cell carcinoma of face, Cardiomyopathy ([place]) (04/2015), Cholecystitis, Chronic respiratory failure (H[facility]), Chronic systolic CHF (congestive heart failure) ([place]), COPD (chronic obstructive pulmonary disease) ([place]), Erectile dysfunction, History of endovascular stent graft for abdominal aortic aneurysm (2002), History of hiatal hernia, HTN (hypertension), Hyperlipidemia, Interstitial lung disease ([place]), LBBB (left bundle branch block), Obesity, On home oxygen therapy, PVD (peripheral vascular disease) ([place]), Shingles, and Stroke ([place]).   reports that he quit smoking about 43 years ago. His smoking use included cigarettes. He has a 25.00 pack-year smoking history. He has never used smokeless tobacco.  Past Surgical History:  Procedure Laterality Date  . AORTA SURGERY  2002  . CHOLECYSTECTOMY N/A 10/05/2015   Procedure: LAPAROSCOPIC CHOLECYSTECTOMY  ;  Surgeon: Autumn Messing III, MD;  Location: [place];  Service: General;  Laterality: N/A;  . [place]  . INTRAOPERATIVE CHOLANGIOGRAM  10/05/2015   Procedure: INTRAOPERATIVE CHOLANGIOGRAM;  Surgeon: Autumn Messing III, MD;  Location: [place];  Service: General;;  . LAPAROSCOPIC CHOLECYSTECTOMY  10/05/2015  . NM MYOVIEW  LTD  05/2015   EF 49%. Small size, mild severity perfusion defect in the basal-mid inferoseptal wall. LOW RISK. No ischemia. Defect thought to be related to LBBB artifacts and not true lesion. This could also explain mild systolic dysfunction.  . TRANSTHORACIC ECHOCARDIOGRAM  04/2015   (In setting of acute illness).EF 40-45% with mid-apical anteroseptal akinesis. Moderately increased PA pressures of roughly 48 mmHg.  [name] URETHRAL STRICTURE DILATATION  late 1970s   urinary tract stretch    Allergies  Allergen Reactions  . Ciprofloxacin Other (See Comments)    Tingle feeling throughout body  . Tape Other (See Comments)    Redness, Please use "paper" tape    Immunization History  Administered Date(s) Administered  . Influenza Split 06/22/2012, 06/22/2013, 06/03/2015  . Influenza, High Dose Seasonal PF 04/22/2016, 05/03/2017  . Influenza-Unspecified 06/22/2014  .  Pneumococcal Polysaccharide-23 06/22/2010  . Tdap 09/22/2012    Family History  Problem Relation Age of Onset  . Hypertension Mother   . Stroke Father   . Heart disease Father   . Colon cancer Brother      Current Outpatient Medications:  .  Acetaminophen (TYLENOL PO), Take 600 mg by mouth., Disp: , Rfl:  .  carvedilol (COREG) 12.5 MG tablet, TAKE 1 TABLET (12.5 MG TOTAL) BY MOUTH 2 (TWO) TIMES DAILY WITH A MEAL., Disp: 180 tablet, Rfl: 3 .  Cholecalciferol (VITAMIN D PO), Take 1 tablet by mouth daily., Disp: , Rfl:  .  clopidogrel (PLAVIX) 75 MG tablet, Take 75 mg by mouth daily. , Disp: , Rfl: 6 .  ezetimibe (ZETIA) 10 MG tablet, Take 10 mg by mouth daily. , Disp: , Rfl:  .  losartan (COZAAR) 50 MG tablet, Take 50 mg by mouth daily., Disp: , Rfl:  .  Multiple Vitamin (MULTIVITAMIN WITH MINERALS) TABS tablet, Take 1 tablet by mouth daily., Disp: , Rfl:  .  nitroGLYCERIN (NITROSTAT) 0.4 MG SL tablet, Place 1 tablet (0.4 mg total) under the tongue every 5 (five) minutes as needed for chest pain., Disp: 30 tablet, Rfl: 0 .   pantoprazole (PROTONIX) 40 MG tablet, Take 40 mg by mouth daily., Disp: , Rfl: 11 .  Pitavastatin Calcium (LIVALO) 2 MG TABS, Take 2 mg by mouth See admin instructions. Every 3rd day, Disp: , Rfl:  .  Probiotic Product ([place]) CAPS, Take 1 capsule by mouth daily., Disp: , Rfl:  .  albuterol (PROVENTIL HFA;VENTOLIN HFA) 108 (90 BASE) MCG/ACT inhaler, Inhale 2 puffs into the lungs every 6 (six) hours as needed for wheezing or shortness of breath. (Patient not taking: Reported on 01/25/2018), Disp: 1 Inhaler, Rfl: 0 .  predniSONE (DELTASONE) 10 MG tablet, 4 tabs x 3 days, 3 tabs x 3 days, 2 tabs x 3 days, 1 tab x 3 days then stop (Patient not taking: Reported on 01/25/2018), Disp: 30 tablet, Rfl: 0 .  Tiotropium Bromide-Olodaterol (STIOLTO RESPIMAT) 2.5-2.5 MCG/ACT AERS, Inhale into the lungs., Disp: , Rfl:    Review of Systems     Objective:   Physical Exam Today's Vitals   01/25/18 1407  BP: 116/60  Pulse: 82  SpO2: 96%  Weight: 204 lb 3.2 oz (92.6 kg)  Height: 5\' 6"  (1.676 m)    Body mass index is 32.96 kg/m.    General Appearance:    Looks chronic ill and deconditioned OBESE - +  Head:    Normocephalic, without obvious abnormality, atraumatic  Eyes:    PERRL - yes, conjunctiva/corneas - clear      Ears:    Normal external ear canals, both ears  Nose:   NG tube - no  Throat:  ETT TUBE - no , OG tube - no  Neck:   Supple,  No enlargement/tenderness/nodules     Lungs:     Coarse without distress. Has crackles  Chest wall:    No deformity  Heart:    S1 and S2 normal, no murmur, CVP - no.  Pressors - no  Abdomen:     Soft, no masses, no organomegaly but  Obese with ventral hernia  Genitalia:    Not done  Rectal:   not done  Extremities:   Extremities- intact . HAS CANE     Skin:   Intact in exposed areas .     Neurologic:   Sedation - none -> RASS -  0 . Moves all 4s - yes. CAM-ICU - neg . Orientation - x3+        Assessment:       ICD-10-CM   1. Pulmonary  emphysema with fibrosis of lung ([place]) J43.9    J84.10        Plan:     Lot of symptoms  Plan Restart spiriva respimat once daily BAsic supportive care for fibrosis Continue night o2  followup 4 weeks with APP to go over progress and if we need to step  Up inhaler regimen v change to nebs   Dr. Brand Males, M.D., [facility].C.P Pulmonary and Critical Care Medicine Staff Physician, [place] Director - Interstitial Lung Disease  Program  Pulmonary [facility] [place], [place], [address]  Pager: [phone], If no answer or between  15:00h - 7:00h: call [phone] Telephone: [phone]

## 2018-01-25 NOTE — Patient Instructions (Signed)
ICD-10-CM   1. Pulmonary emphysema with fibrosis of lung ([place]) J43.9    J84.10     Lot of symptoms  Plan Restart spiriva respimat once daily BAsic supportive care for fibrosis Continue night o2  followup 4 weeks with APP to go over progress and if we need to step  Up inhaler regimen v change to nebs

## 2018-01-26 ENCOUNTER — Ambulatory Visit (HOSPITAL_COMMUNITY)
Admission: RE | Admit: 2018-01-26 | Discharge: 2018-01-26 | Disposition: A | Discharge status: Home / Self Care | Payer: Medicare Other | Source: Ambulatory Visit | Attending: Internal Medicine | Admitting: Internal Medicine

## 2018-01-26 DIAGNOSIS — Z9889 Other specified postprocedural states: Secondary | ICD-10-CM | POA: Insufficient documentation

## 2018-01-26 DIAGNOSIS — I10 Essential (primary) hypertension: Secondary | ICD-10-CM | POA: Diagnosis not present

## 2018-01-26 DIAGNOSIS — Z8673 Personal history of transient ischemic attack (TIA), and cerebral infarction without residual deficits: Secondary | ICD-10-CM | POA: Diagnosis not present

## 2018-01-26 DIAGNOSIS — F0390 Unspecified dementia without behavioral disturbance: Secondary | ICD-10-CM | POA: Insufficient documentation

## 2018-01-26 DIAGNOSIS — Z8701 Personal history of pneumonia (recurrent): Secondary | ICD-10-CM | POA: Insufficient documentation

## 2018-01-26 DIAGNOSIS — R1310 Dysphagia, unspecified: Secondary | ICD-10-CM | POA: Diagnosis present

## 2018-02-26 ENCOUNTER — Encounter: Payer: Self-pay | Admitting: Pulmonary Disease

## 2018-02-26 ENCOUNTER — Ambulatory Visit (INDEPENDENT_AMBULATORY_CARE_PROVIDER_SITE_OTHER): Payer: Medicare Other | Admitting: Pulmonary Disease

## 2018-02-26 DIAGNOSIS — J438 Other emphysema: Secondary | ICD-10-CM | POA: Diagnosis not present

## 2018-02-26 MED ORDER — TIOTROPIUM BROMIDE-OLODATEROL 2.5-2.5 MCG/ACT IN AERS: 1.0000 | INHALATION_SPRAY | Freq: Every day | RESPIRATORY_TRACT | 0 refills | Status: DC

## 2018-02-26 MED ORDER — TIOTROPIUM BROMIDE-OLODATEROL 2.5-2.5 MCG/ACT IN AERS: 2.0000 | INHALATION_SPRAY | Freq: Every day | RESPIRATORY_TRACT | 0 refills | Status: DC

## 2018-02-26 NOTE — Addendum Note (Signed)
Addended by: Vivia Ewing on: 02/26/2018 12:52 PM   Modules accepted: Orders

## 2018-02-26 NOTE — Patient Instructions (Addendum)
Continue Stiolto >>> 2 puffs inhaled daily >>> Contact us with any concerns regarding the cost or obtaining this medication  Continue nighttime oxygen  Follow-up with our office in 2 months  Continue to try to increase daily activities such as walking outside and with your walker      Please contact the office if your symptoms worsen or you have concerns that you are not improving.   Thank you for choosing [facility] for your healthcare, and for allowing [place] to partner with you on your healthcare journey. I am thankful to be able to provide care to you today.   Wyn Quaker FNP-C

## 2018-02-26 NOTE — Assessment & Plan Note (Signed)
Continue Stiolto >>> 2 puffs inhaled daily >>> Contact us with any concerns regarding the cost or obtaining this medication  Continue nighttime oxygen  Follow-up with our office in 2 months  Continue to try to increase daily activities such as walking outside and with your walker
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
## 2018-04-20 ENCOUNTER — Other Ambulatory Visit (HOSPITAL_BASED_OUTPATIENT_CLINIC_OR_DEPARTMENT_OTHER): Payer: Self-pay | Admitting: Internal Medicine

## 2018-04-20 ENCOUNTER — Ambulatory Visit (HOSPITAL_BASED_OUTPATIENT_CLINIC_OR_DEPARTMENT_OTHER)
Admission: RE | Admit: 2018-04-20 | Discharge: 2018-04-20 | Disposition: A | Discharge status: Home / Self Care | Payer: Medicare Other | Source: Ambulatory Visit | Attending: Internal Medicine | Admitting: Internal Medicine

## 2018-04-20 DIAGNOSIS — R600 Localized edema: Secondary | ICD-10-CM | POA: Diagnosis not present

## 2018-04-20 DIAGNOSIS — M79605 Pain in left leg: Secondary | ICD-10-CM

## 2018-05-07 ENCOUNTER — Telehealth: Payer: Self-pay | Admitting: Internal Medicine

## 2018-05-07 ENCOUNTER — Encounter: Payer: Self-pay | Admitting: Internal Medicine

## 2018-05-07 ENCOUNTER — Ambulatory Visit (INDEPENDENT_AMBULATORY_CARE_PROVIDER_SITE_OTHER)
Admission: RE | Admit: 2018-05-07 | Discharge: 2018-05-07 | Disposition: A | Discharge status: Home / Self Care | Payer: Medicare Other | Source: Ambulatory Visit | Attending: Internal Medicine | Admitting: Internal Medicine

## 2018-05-07 ENCOUNTER — Ambulatory Visit: Payer: Medicare Other | Admitting: Internal Medicine

## 2018-05-07 VITALS — BP 114/64 | HR 97 | Ht 65.0 in | Wt 207.6 lb

## 2018-05-07 DIAGNOSIS — R600 Localized edema: Secondary | ICD-10-CM | POA: Diagnosis not present

## 2018-05-07 DIAGNOSIS — J438 Other emphysema: Secondary | ICD-10-CM

## 2018-05-07 DIAGNOSIS — Z8701 Personal history of pneumonia (recurrent): Secondary | ICD-10-CM | POA: Diagnosis not present

## 2018-05-07 DIAGNOSIS — J8410 Pulmonary fibrosis, unspecified: Secondary | ICD-10-CM

## 2018-05-07 DIAGNOSIS — J84112 Idiopathic pulmonary fibrosis: Secondary | ICD-10-CM

## 2018-05-07 DIAGNOSIS — J439 Emphysema, unspecified: Secondary | ICD-10-CM

## 2018-05-07 NOTE — Telephone Encounter (Signed)
Called patients wife, unable to reach left message to give us a call back.  

## 2018-05-07 NOTE — Telephone Encounter (Signed)
Let the wife of JAMESEN STAHNKE know that cxr does NOT show pneumonia. Just shows ILD. Please send repor tto PCP [name], MD   Thanks    SIGNATURE    Dr. Brand Males, M.D., F.C.C.P,  Pulmonary and Critical Care Medicine Staff Physician, [place] Director - Interstitial Lung Disease  Program  Pulmonary [facility] [place], [place], [address]  Pager: [phone], If no answer or between  15:00h - 7:00h: call [phone] Telephone: [phone]  5:41 PM 05/07/2018   Dg Chest 2 View  Result Date: 05/07/2018 CLINICAL DATA:  Follow-up recent episode of pneumonia EXAM: CHEST - 2 VIEW COMPARISON:  Chest x-ray of July 28, 2017. FINDINGS: The lungs are mildly hypoinflated. The interstitial markings are coarse. Patchy density is noted in the right infrahilar region and at the left lung base little changed from the study of 9 months ago. There is no pleural effusion. The heart is normal in size. The pulmonary vascularity is not engorged. There is calcification in the wall of the aortic arch. IMPRESSION: Bilateral hypoinflation limits the sensitivity of the study. Chronic bronchitic changes and bibasilar scarring. No acute pneumonia or pulmonary edema. Thoracic aortic atherosclerosis. Electronically Signed   By: David  Martinique M.D.   On: 05/07/2018 17:00

## 2018-05-07 NOTE — Patient Instructions (Addendum)
History of recent pneumonia  - get cxr 05/07/2018   Will send repor to PCP [name], MD    Pulmonary emphysema with fibrosis of lung ([place]) IPF (idiopathic pulmonary fibrosis) ([place]) Other emphysema ([place])  - over all stable  - continue stiolto + night o2  - CMA to mark flu shot deferred to PCP later this month  - supprotive care for IPF   Pedal edema - please discuss with PCP [name], MD about pedal edema management  Followup 4-6 monts in regular clinic or sooner if needed

## 2018-05-07 NOTE — Progress Notes (Signed)
OV 07/25/2016  Chief Complaint  Patient presents with  . Follow-up    pt states his breathing is unchanged since last OV. Pt c/o prod cough with light yellow mucus - unchanged since last OV. Pt denies CP./tightness.     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 In the interim he's had his cataract surgery uneventfully. He does not like to take medications. In the past he has decline anti-fibrotic therapy. He presents with his wife as usual. Overall pulmonary health is stable. It appears that he has some memory issues and is going to be started on exelon Patch. He uses nocturnal 02. He is on spiriva though not on MAR. Wife says he is compliant with it. He is wondring of about portable o2 for travel . Walking desat test 07/25/16 - 185 feet x 3 laps on RA: slow walk and fatigued. Lowest pulse ox 94% on forehead prlbe  Upteodate with flu shot +    OV 01/23/2017  Chief Complaint  Patient presents with  . Follow-up    Pt states his breathing is unchanged since last OV. Pt c/o prod cough with light yellow mucus. Pt denies CP/tightness and f/c/s.     follow-up combined emphysema with intestitial lung disease initial diagnosis of IPF   this is a six-month follow-up. He is not on antibiotic terapy. He is only on Spiriva. His wife reports for the last 1 month is increased chest tightness and congestion with increased yellow sputum. But he is denying any problems. This no wheezing. Both of them agree that there is no increased wheezing. They're tryingto make him walk a little bit more than usual.     OV 07/24/2017  Chief Complaint  Patient presents with  . Follow-up    Pt's wife stated that pt had bronchitis. Pt went to the ED last night 07/23/17 and dx was COPD. Pt's wife states that she believes he had a UTI due to running a fever. Pt is currently on cephalexin. Pt's wife states that today is a much better day  than yesterday due to pt unable to walk yesterday at the ED   FU combined emphysema/fibrosis  Follow-up combined emphysema pulmonary fibrosis on supportive care. Wife is here with him. For the last 2-3 days he's had increased cough with congestion and yellow sputum worse than baseline with deep cough. There is no increased wheezing or shortness of breath. Associated with this she started developing jerky left leg and pain which usually reflects associated urinary tract infection. Went to the emergency room yesterday was started on cephalexin. It is also having some delirium. He is now completely improved. Slowly getting better. Wife does not think there is a role for prednisone. He is up-to-date with his flu shot.   OV 01/25/18  Chief Complaint  Patient presents with  . Follow-up    per wife increased wheezing, has not been using inhalers. Reports cough is minimal.     Follow-up combined emphysema with pulmonary fibrosis on basic supportive care for pulmonary fibrosis  Wife is here with him.  I personally not seen him in a year.  6 months ago he saw my colleague Dr. Vaughan Browner.  Wife tells me that over time he is insidious worsening of cough with wheezing and mucus production that is clear to yellow.  Shortness of breath could be worse but then he is also more deconditioned is needing a cane and  is needing significant assistance at home.  They are going to get a caretaker come in and visit with him 4 times a week.  There is no acute decompensation or subacute decompensation.  COPD CAT score is 23 showing significant amount of symptomatology.  He has also had new onset dysphagia for the last few to several months.  He is having a swallow study ordered by primary care physician.  Wife is wondering whether he should restart himself on inhalers.  Apparently Spiriva worked in the past.  He is more in favor of inhalers as opposed to nebulizers which take time and is of increased frequency.        OV  05/07/2018  Subjective:  Patient ID: Lee [name], male , DOB: 10/06/1930 , age 84 y.o. , MRN: [id number] , ADDRESS: [address] [name] [address]   05/07/2018 -   Chief Complaint  Patient presents with  . Follow-up    Pt went to see PCP x4 weeks ago and after having a cxr was told to have pna. Pt has had complaints of cellulitis. States breathing is about the same as last visit. Does have complaints of a cough with yellow mucus.   Follow-up combined emphysema and IPF  HPI Lee [name] 84 y.o. -Presents with his wife. Wife tells me that overall he is stable although maybe 6 weeks ago a routine chest x-ray at primary care office showed pneumonia and he was given cephalexin. After that for his chronic pedal edema he developed left lower extremity cellulitis and was given doxycycline. Despite this his chronic cough with yellow sputum remains unchanged. Overall he is less mobile. He has chronic pedal edema. He is requiring increased assistance. He spends time on the wheelchair. His ECOG is 4. His dementia slightly progressive. Apparently verbalization is difficult. He does use inhaler stiolto hoarse, and emphysemaand this is helpful. Has CAT score is unchanged     CAT COPD Symptom & Quality of Life Score (GSK trademark) 0 is no burden. 5 is highest burden 01/25/2018  05/07/2018   Never Cough -> Cough all the time 4 3  No phlegm in chest -> Chest is full of phlegm 3 3  No chest tightness -> Chest feels very tight 0 0  No dyspnea for 1 flight stairs/hill -> Very dyspneic for 1 flight of stairs 4 3  No limitations for ADL at home -> Very limited with ADL at home 5 5  Confident leaving home -> Not at all confident leaving home 2 5  Sleep soundly -> Do not sleep soundly because of lung condition 1 0  Lots of Energy -> No energy at all 4 4  TOTAL Score (max 40)  23 23   No results found.   ROS - per HPI     has a past medical history of Allergic rhinitis, Anxiety, Basal cell  carcinoma of face, Cardiomyopathy ([place]) (04/2015), Cholecystitis, Chronic respiratory failure (HCC), Chronic systolic CHF (congestive heart failure) ([place]), COPD (chronic obstructive pulmonary disease) ([place]), Erectile dysfunction, History of endovascular stent graft for abdominal aortic aneurysm (2002), History of hiatal hernia, HTN (hypertension), Hyperlipidemia, Interstitial lung disease ([place]), LBBB (left bundle branch block), Obesity, On home oxygen therapy, PVD (peripheral vascular disease) ([place]), Shingles, and Stroke ([place]).   reports that he quit smoking about 43 years ago. His smoking use included cigarettes. He has a 25.00 pack-year smoking history. He has never used smokeless tobacco.  Past Surgical History:  Procedure Laterality Date  . AORTA  SURGERY  2002  . CHOLECYSTECTOMY N/A 10/05/2015   Procedure: LAPAROSCOPIC CHOLECYSTECTOMY  ;  Surgeon: Autumn Messing III, MD;  Location: [facility];  Service: General;  Laterality: N/A;  . [place]  . INTRAOPERATIVE CHOLANGIOGRAM  10/05/2015   Procedure: INTRAOPERATIVE CHOLANGIOGRAM;  Surgeon: Autumn Messing III, MD;  Location: [facility];  Service: General;;  . LAPAROSCOPIC CHOLECYSTECTOMY  10/05/2015  . NM MYOVIEW LTD  05/2015   EF 49%. Small size, mild severity perfusion defect in the basal-mid inferoseptal wall. LOW RISK. No ischemia. Defect thought to be related to LBBB artifacts and not true lesion. This could also explain mild systolic dysfunction.  . TRANSTHORACIC ECHOCARDIOGRAM  04/2015   (In setting of acute illness).EF 40-45% with mid-apical anteroseptal akinesis. Moderately increased PA pressures of roughly 48 mmHg.  [name] URETHRAL STRICTURE DILATATION  late 1970s   urinary tract stretch    Allergies  Allergen Reactions  . Ciprofloxacin Other (See Comments)    Tingle feeling throughout body  . Tape Other (See Comments)    Redness, Please use "paper" tape    Immunization History  Administered Date(s) Administered  . Influenza Split 06/22/2012,  06/22/2013, 06/03/2015  . Influenza, High Dose Seasonal PF 04/22/2016, 05/03/2017  . Influenza-Unspecified 06/22/2014  . Pneumococcal Polysaccharide-23 06/22/2010  . Tdap 09/22/2012    Family History  Problem Relation Age of Onset  . Hypertension Mother   . Stroke Father   . Heart disease Father   . Colon cancer Brother      Current Outpatient Medications:  .  Acetaminophen (TYLENOL PO), Take 600 mg by mouth., Disp: , Rfl:  .  albuterol (PROVENTIL HFA;VENTOLIN HFA) 108 (90 BASE) MCG/ACT inhaler, Inhale 2 puffs into the lungs every 6 (six) hours as needed for wheezing or shortness of breath., Disp: 1 Inhaler, Rfl: 0 .  carvedilol (COREG) 12.5 MG tablet, TAKE 1 TABLET (12.5 MG TOTAL) BY MOUTH 2 (TWO) TIMES DAILY WITH A MEAL., Disp: 180 tablet, Rfl: 3 .  Cholecalciferol (VITAMIN D PO), Take 1 tablet by mouth daily., Disp: , Rfl:  .  clopidogrel (PLAVIX) 75 MG tablet, Take 75 mg by mouth daily. , Disp: , Rfl: 6 .  ezetimibe (ZETIA) 10 MG tablet, Take 10 mg by mouth daily. , Disp: , Rfl:  .  losartan (COZAAR) 50 MG tablet, Take 50 mg by mouth daily., Disp: , Rfl:  .  Multiple Vitamin (MULTIVITAMIN WITH MINERALS) TABS tablet, Take 1 tablet by mouth daily., Disp: , Rfl:  .  pantoprazole (PROTONIX) 40 MG tablet, Take 40 mg by mouth daily., Disp: , Rfl: 11 .  Pitavastatin Calcium (LIVALO) 2 MG TABS, Take 2 mg by mouth See admin instructions. Every 3rd day, Disp: , Rfl:  .  Probiotic Product ([place]) CAPS, Take 1 capsule by mouth daily., Disp: , Rfl:  .  Tiotropium Bromide-Olodaterol (STIOLTO RESPIMAT) 2.5-2.5 MCG/ACT AERS, Inhale 2 puffs into the lungs daily., Disp: 1 Inhaler, Rfl: 0 .  nitroGLYCERIN (NITROSTAT) 0.4 MG SL tablet, Place 1 tablet (0.4 mg total) under the tongue every 5 (five) minutes as needed for chest pain. (Patient not taking: Reported on 05/07/2018), Disp: 30 tablet, Rfl: 0      Objective:   Vitals:   05/07/18 1218  BP: 114/64  Pulse: 97  SpO2: (!) 67%    Weight: 207 lb 9.6 oz (94.2 kg)  Height: 5\' 5"  (1.651 m)    Estimated body mass index is 34.55 kg/m as calculated from the following:  Height as of this encounter: 5\' 5"  (1.651 m).   Weight as of this encounter: 207 lb 9.6 oz (94.2 kg).  @WEIGHTCHANGE @  [employer]   05/07/18 1218  Weight: 207 lb 9.6 oz (94.2 kg)     Physical Exam  General Appearance:    Alert, cooperative, no distress, appears stated age - YES , sitting on - wheel chair  Head:    Normocephalic, without obvious abnormality, atraumatic  Eyes:    PERRL, conjunctiva/corneas clear,  Ears:    Normal TM's and external ear canals, both ears  Nose:   Nares normal, septum midline, mucosa normal, no drainage    or sinus tenderness. OXYGEN ON  - no . Patient is @ ra   Throat:   Lips, mucosa, and tongue normal; teeth and gums normal. Cyanosis on lips - no  Neck:   Supple, symmetrical, trachea midline, no adenopathy;    thyroid:  no enlargement/tenderness/nodules; no carotid   bruit or JVD  Back:     Symmetric, no curvature, ROM normal, no CVA tenderness  Lungs:     Distress - no , Wheeze no, Barrell Chest - no, Purse lip breathing - no, Crackles - yes basal velcro   Chest Wall:    No tenderness or deformity. Scars in chest no   Heart:    Regular rate and rhythm, S1 and S2 normal, no rub   or gallop, Murmur - no  Breast Exam:    NOT DONE  Abdomen:     Soft, non-tender, bowel sounds active all four quadrants,    no masses, no organomegaly  Genitalia:   NOT DONE  Rectal:   NOT DONE  Extremities:   Extremities normal, atraumatic, Clubbing - no, Edema - +++ bioth LE. LLE mild rednss and warmth - improving per wife  Pulses:   2+ and symmetric all extremities  Skin:   Stigmata of Connective Tissue Disease - no  Lymph nodes:   Cervical, supraclavicular, and axillary nodes normal  Psychiatric:  Neurologic:   Dementia, cannot verbalize well CNII-XII intact, normal strength, sensation  throughout            Assessment:       ICD-10-CM   1. History of recent pneumonia Z87.01 DG Chest 2 View  2. Pulmonary emphysema with fibrosis of lung ([place]) J43.9    J84.10   3. IPF (idiopathic pulmonary fibrosis) ([place]) J84.112   4. Other emphysema ([place]) J43.8   5. Pedal edema R60.0        Plan:     History of recent pneumonia  - get cxr 05/07/2018   Will send repor to PCP [name], MD    Pulmonary emphysema with fibrosis of lung ([place]) IPF (idiopathic pulmonary fibrosis) ([place]) Other emphysema ([place])  - over all stable  - continue stiolto + night o2  - CMA to mark flu shot deferred to PCP later this month  - supprotive care for IPF   Pedal edema - please discuss with PCP [name], MD about pedal edema management  Followup 4-6 monts in regular clinic or sooner if needed           SIGNATURE    Dr. Brand Males, M.D., F.C.C.P,  Pulmonary and Critical Care Medicine Staff Physician, [facility] Director - Interstitial Lung Disease  Program  [facility] [place], [place], [address]  Pager: [phone], If no answer or between  15:00h - 7:00h: call  [place] Telephone: [phone]  1:45 PM 05/07/2018

## 2018-05-08 ENCOUNTER — Telehealth: Payer: Self-pay | Admitting: Internal Medicine

## 2018-05-08 NOTE — Telephone Encounter (Signed)
LMTCB regarding CXR results. Note sent to MD Perini as requested.

## 2018-05-08 NOTE — Telephone Encounter (Signed)
Please see phone note dated 05/07/18. Will close encounter, as another has been created.

## 2018-05-08 NOTE — Telephone Encounter (Signed)
Pt's spouse, Enid Derry [facility]) is aware of below results and voiced her understanding. Nothing further is needed.

## 2018-06-15 ENCOUNTER — Encounter (HOSPITAL_COMMUNITY): Payer: Self-pay | Admitting: Emergency Medicine

## 2018-06-15 ENCOUNTER — Inpatient Hospital Stay (HOSPITAL_COMMUNITY)
Admission: EM | Admit: 2018-06-15 | Discharge: 2018-06-20 | DRG: 177 | Disposition: A | Discharge status: Skilled Nursing Facility | Payer: Medicare Other | Attending: Internal Medicine | Admitting: Internal Medicine

## 2018-06-15 ENCOUNTER — Emergency Department (HOSPITAL_COMMUNITY): Payer: Medicare Other

## 2018-06-15 DIAGNOSIS — J84112 Idiopathic pulmonary fibrosis: Secondary | ICD-10-CM | POA: Diagnosis present

## 2018-06-15 DIAGNOSIS — K219 Gastro-esophageal reflux disease without esophagitis: Secondary | ICD-10-CM | POA: Diagnosis present

## 2018-06-15 DIAGNOSIS — Z881 Allergy status to other antibiotic agents status: Secondary | ICD-10-CM

## 2018-06-15 DIAGNOSIS — Z800 Family history of malignant neoplasm of digestive organs: Secondary | ICD-10-CM

## 2018-06-15 DIAGNOSIS — Z8249 Family history of ischemic heart disease and other diseases of the circulatory system: Secondary | ICD-10-CM | POA: Diagnosis not present

## 2018-06-15 DIAGNOSIS — Z8673 Personal history of transient ischemic attack (TIA), and cerebral infarction without residual deficits: Secondary | ICD-10-CM

## 2018-06-15 DIAGNOSIS — J690 Pneumonitis due to inhalation of food and vomit: Principal | ICD-10-CM | POA: Diagnosis present

## 2018-06-15 DIAGNOSIS — I2699 Other pulmonary embolism without acute cor pulmonale: Secondary | ICD-10-CM | POA: Diagnosis present

## 2018-06-15 DIAGNOSIS — R0603 Acute respiratory distress: Secondary | ICD-10-CM | POA: Diagnosis present

## 2018-06-15 DIAGNOSIS — Z9049 Acquired absence of other specified parts of digestive tract: Secondary | ICD-10-CM | POA: Diagnosis not present

## 2018-06-15 DIAGNOSIS — M21372 Foot drop, left foot: Secondary | ICD-10-CM | POA: Diagnosis present

## 2018-06-15 DIAGNOSIS — Z85828 Personal history of other malignant neoplasm of skin: Secondary | ICD-10-CM

## 2018-06-15 DIAGNOSIS — Z823 Family history of stroke: Secondary | ICD-10-CM

## 2018-06-15 DIAGNOSIS — Z7902 Long term (current) use of antithrombotics/antiplatelets: Secondary | ICD-10-CM | POA: Diagnosis not present

## 2018-06-15 DIAGNOSIS — I2609 Other pulmonary embolism with acute cor pulmonale: Secondary | ICD-10-CM

## 2018-06-15 DIAGNOSIS — R4182 Altered mental status, unspecified: Secondary | ICD-10-CM | POA: Diagnosis present

## 2018-06-15 DIAGNOSIS — I739 Peripheral vascular disease, unspecified: Secondary | ICD-10-CM | POA: Diagnosis present

## 2018-06-15 DIAGNOSIS — I10 Essential (primary) hypertension: Secondary | ICD-10-CM

## 2018-06-15 DIAGNOSIS — R269 Unspecified abnormalities of gait and mobility: Secondary | ICD-10-CM | POA: Diagnosis present

## 2018-06-15 DIAGNOSIS — R400 Somnolence: Secondary | ICD-10-CM | POA: Diagnosis not present

## 2018-06-15 DIAGNOSIS — Z87440 Personal history of urinary (tract) infections: Secondary | ICD-10-CM | POA: Diagnosis not present

## 2018-06-15 DIAGNOSIS — Z79899 Other long term (current) drug therapy: Secondary | ICD-10-CM

## 2018-06-15 DIAGNOSIS — I110 Hypertensive heart disease with heart failure: Secondary | ICD-10-CM | POA: Diagnosis present

## 2018-06-15 DIAGNOSIS — J438 Other emphysema: Secondary | ICD-10-CM

## 2018-06-15 DIAGNOSIS — E7800 Pure hypercholesterolemia, unspecified: Secondary | ICD-10-CM | POA: Diagnosis present

## 2018-06-15 DIAGNOSIS — Z9981 Dependence on supplemental oxygen: Secondary | ICD-10-CM | POA: Diagnosis not present

## 2018-06-15 DIAGNOSIS — I428 Other cardiomyopathies: Secondary | ICD-10-CM

## 2018-06-15 DIAGNOSIS — J439 Emphysema, unspecified: Secondary | ICD-10-CM | POA: Diagnosis present

## 2018-06-15 DIAGNOSIS — Z91048 Other nonmedicinal substance allergy status: Secondary | ICD-10-CM

## 2018-06-15 DIAGNOSIS — I272 Pulmonary hypertension, unspecified: Secondary | ICD-10-CM | POA: Diagnosis present

## 2018-06-15 DIAGNOSIS — J9621 Acute and chronic respiratory failure with hypoxia: Secondary | ICD-10-CM | POA: Diagnosis present

## 2018-06-15 DIAGNOSIS — I5042 Chronic combined systolic (congestive) and diastolic (congestive) heart failure: Secondary | ICD-10-CM | POA: Diagnosis present

## 2018-06-15 LAB — I-STAT ARTERIAL BLOOD GAS, ED
ACID-BASE DEFICIT: 1 mmol/L (ref 0.0–2.0)
BICARBONATE: 22.3 mmol/L (ref 20.0–28.0)
O2 Saturation: 99 %
PO2 ART: 124 mmHg — AB (ref 83.0–108.0)
Patient temperature: 99
TCO2: 23 mmol/L (ref 22–32)
pCO2 arterial: 33.5 mmHg (ref 32.0–48.0)
pH, Arterial: 7.433 (ref 7.350–7.450)

## 2018-06-15 LAB — URINALYSIS, ROUTINE W REFLEX MICROSCOPIC
BILIRUBIN URINE: NEGATIVE
Bacteria, UA: NONE SEEN
GLUCOSE, UA: NEGATIVE mg/dL
Hgb urine dipstick: NEGATIVE
KETONES UR: NEGATIVE mg/dL
Nitrite: NEGATIVE
Protein, ur: NEGATIVE mg/dL
Specific Gravity, Urine: 1.006 (ref 1.005–1.030)
pH: 5 (ref 5.0–8.0)

## 2018-06-15 LAB — CBC WITH DIFFERENTIAL/PLATELET
ABS IMMATURE GRANULOCYTES: 0.06 10*3/uL (ref 0.00–0.07)
Basophils Absolute: 0 10*3/uL (ref 0.0–0.1)
Basophils Relative: 0 %
Eosinophils Absolute: 0.1 10*3/uL (ref 0.0–0.5)
Eosinophils Relative: 1 %
HCT: 41.5 % (ref 39.0–52.0)
Hemoglobin: 13.5 g/dL (ref 13.0–17.0)
Immature Granulocytes: 0 %
LYMPHS ABS: 1.1 10*3/uL (ref 0.7–4.0)
Lymphocytes Relative: 7 %
MCH: 30.8 pg (ref 26.0–34.0)
MCHC: 32.5 g/dL (ref 30.0–36.0)
MCV: 94.7 fL (ref 80.0–100.0)
MONO ABS: 1 10*3/uL (ref 0.1–1.0)
MONOS PCT: 6 %
NEUTROS ABS: 14.3 10*3/uL — AB (ref 1.7–7.7)
Neutrophils Relative %: 86 %
Platelets: 279 10*3/uL (ref 150–400)
RBC: 4.38 MIL/uL (ref 4.22–5.81)
RDW: 13.2 % (ref 11.5–15.5)
WBC: 16.6 10*3/uL — ABNORMAL HIGH (ref 4.0–10.5)
nRBC: 0 % (ref 0.0–0.2)

## 2018-06-15 LAB — COMPREHENSIVE METABOLIC PANEL
ALBUMIN: 3.5 g/dL (ref 3.5–5.0)
ALT: 19 U/L (ref 0–44)
AST: 23 U/L (ref 15–41)
Alkaline Phosphatase: 62 U/L (ref 38–126)
Anion gap: 7 (ref 5–15)
BILIRUBIN TOTAL: 0.9 mg/dL (ref 0.3–1.2)
BUN: 10 mg/dL (ref 8–23)
CHLORIDE: 106 mmol/L (ref 98–111)
CO2: 25 mmol/L (ref 22–32)
Calcium: 8.8 mg/dL — ABNORMAL LOW (ref 8.9–10.3)
Creatinine, Ser: 1.05 mg/dL (ref 0.61–1.24)
GFR calc Af Amer: >60 mL/min (ref >60)
GFR calc non Af Amer: >60 mL/min (ref >60)
Glucose, Bld: 122 mg/dL — ABNORMAL HIGH (ref 70–99)
POTASSIUM: 3.9 mmol/L (ref 3.5–5.1)
Sodium: 138 mmol/L (ref 135–145)
Total Protein: 6.4 g/dL — ABNORMAL LOW (ref 6.5–8.1)

## 2018-06-15 LAB — I-STAT TROPONIN, ED: Troponin i, poc: 0 ng/mL (ref 0.00–0.08)

## 2018-06-15 LAB — INFLUENZA PANEL BY PCR (TYPE A & B)
Influenza A By PCR: NEGATIVE
Influenza B By PCR: NEGATIVE

## 2018-06-15 LAB — BRAIN NATRIURETIC PEPTIDE: B Natriuretic Peptide: 141.4 pg/mL — ABNORMAL HIGH (ref 0.0–100.0)

## 2018-06-15 LAB — I-STAT CG4 LACTIC ACID, ED: Lactic Acid, Venous: 1.29 mmol/L (ref 0.5–1.9)

## 2018-06-15 MED ORDER — HEPARIN BOLUS VIA INFUSION
5000.0000 [IU] | Freq: Once | INTRAVENOUS | Status: AC
  Administered 2018-06-15: 5000 [IU] via INTRAVENOUS
  Filled 2018-06-15: qty 5000

## 2018-06-15 MED ORDER — FUROSEMIDE 10 MG/ML IJ SOLN: 20.0000 mg | Freq: Once | INTRAMUSCULAR | Status: DC

## 2018-06-15 MED ORDER — FUROSEMIDE 10 MG/ML IJ SOLN
20.0000 mg | Freq: Once | INTRAMUSCULAR | Status: AC
  Administered 2018-06-15: 20 mg via INTRAVENOUS
  Filled 2018-06-15: qty 2

## 2018-06-15 MED ORDER — VITAMIN D 1000 UNITS PO TABS
1000.0000 [IU] | ORAL_TABLET | Freq: Every day | ORAL | Status: DC
  Administered 2018-06-16 – 2018-06-20 (×5): 1000 [IU] via ORAL
  Filled 2018-06-15 (×5): qty 1

## 2018-06-15 MED ORDER — PANTOPRAZOLE SODIUM 40 MG PO TBEC
40.0000 mg | DELAYED_RELEASE_TABLET | Freq: Every day | ORAL | Status: DC
  Administered 2018-06-16 – 2018-06-20 (×5): 40 mg via ORAL
  Filled 2018-06-15 (×5): qty 1

## 2018-06-15 MED ORDER — CLOPIDOGREL BISULFATE 75 MG PO TABS
75.0000 mg | ORAL_TABLET | Freq: Every day | ORAL | Status: DC
  Administered 2018-06-16: 75 mg via ORAL
  Filled 2018-06-15: qty 1

## 2018-06-15 MED ORDER — IPRATROPIUM-ALBUTEROL 0.5-2.5 (3) MG/3ML IN SOLN
3.0000 mL | Freq: Once | RESPIRATORY_TRACT | Status: AC
  Administered 2018-06-15: 3 mL via RESPIRATORY_TRACT
  Filled 2018-06-15: qty 3

## 2018-06-15 MED ORDER — ACETAMINOPHEN 500 MG PO TABS: 600.0000 mg | ORAL_TABLET | ORAL | Status: DC | PRN

## 2018-06-15 MED ORDER — VANCOMYCIN HCL 10 G IV SOLR
2000.0000 mg | Freq: Once | INTRAVENOUS | Status: AC
  Administered 2018-06-15: 2000 mg via INTRAVENOUS
  Filled 2018-06-15: qty 2000

## 2018-06-15 MED ORDER — SODIUM CHLORIDE 0.9 % IV SOLN
2.0000 g | Freq: Three times a day (TID) | INTRAVENOUS | Status: DC
  Administered 2018-06-15 – 2018-06-19 (×12): 2 g via INTRAVENOUS
  Filled 2018-06-15 (×12): qty 2

## 2018-06-15 MED ORDER — LOSARTAN POTASSIUM 50 MG PO TABS
50.0000 mg | ORAL_TABLET | Freq: Every day | ORAL | Status: DC
  Administered 2018-06-16 – 2018-06-20 (×5): 50 mg via ORAL
  Filled 2018-06-15 (×5): qty 1

## 2018-06-15 MED ORDER — VANCOMYCIN HCL IN DEXTROSE 750-5 MG/150ML-% IV SOLN
750.0000 mg | Freq: Two times a day (BID) | INTRAVENOUS | Status: DC
  Administered 2018-06-16: 750 mg via INTRAVENOUS
  Filled 2018-06-15 (×2): qty 150

## 2018-06-15 MED ORDER — PRAVASTATIN SODIUM 40 MG PO TABS
40.0000 mg | ORAL_TABLET | Freq: Every day | ORAL | Status: DC
  Administered 2018-06-16 – 2018-06-20 (×5): 40 mg via ORAL
  Filled 2018-06-15 (×5): qty 1

## 2018-06-15 MED ORDER — SODIUM CHLORIDE 0.9 % IV SOLN
1.0000 g | Freq: Once | INTRAVENOUS | Status: AC
  Administered 2018-06-15: 1 g via INTRAVENOUS
  Filled 2018-06-15: qty 10

## 2018-06-15 MED ORDER — VANCOMYCIN HCL IN DEXTROSE 1-5 GM/200ML-% IV SOLN: 1000.0000 mg | Freq: Once | INTRAVENOUS | Status: DC

## 2018-06-15 MED ORDER — HEPARIN SODIUM (PORCINE) 5000 UNIT/ML IJ SOLN: 4000.0000 [IU] | Freq: Once | INTRAMUSCULAR | Status: DC

## 2018-06-15 MED ORDER — CARVEDILOL 12.5 MG PO TABS
12.5000 mg | ORAL_TABLET | Freq: Two times a day (BID) | ORAL | Status: DC
  Administered 2018-06-16 – 2018-06-20 (×10): 12.5 mg via ORAL
  Filled 2018-06-15 (×10): qty 1

## 2018-06-15 MED ORDER — LORAZEPAM 0.5 MG PO TABS
0.5000 mg | ORAL_TABLET | Freq: Four times a day (QID) | ORAL | Status: DC | PRN
  Administered 2018-06-17: 0.5 mg via ORAL
  Filled 2018-06-15: qty 1

## 2018-06-15 MED ORDER — IOPAMIDOL (ISOVUE-370) INJECTION 76%
100.0000 mL | Freq: Once | INTRAVENOUS | Status: AC | PRN
  Administered 2018-06-15: 100 mL via INTRAVENOUS

## 2018-06-15 MED ORDER — ADULT MULTIVITAMIN W/MINERALS CH
1.0000 | ORAL_TABLET | Freq: Every day | ORAL | Status: DC
  Administered 2018-06-16 – 2018-06-20 (×5): 1 via ORAL
  Filled 2018-06-15 (×4): qty 1

## 2018-06-15 MED ORDER — EZETIMIBE 10 MG PO TABS
10.0000 mg | ORAL_TABLET | Freq: Every day | ORAL | Status: DC
  Administered 2018-06-16 – 2018-06-20 (×5): 10 mg via ORAL
  Filled 2018-06-15 (×5): qty 1

## 2018-06-15 MED ORDER — FUROSEMIDE 10 MG/ML IJ SOLN: 40.0000 mg | Freq: Once | INTRAMUSCULAR | Status: DC

## 2018-06-15 MED ORDER — NITROGLYCERIN 0.4 MG SL SUBL: 0.4000 mg | SUBLINGUAL_TABLET | SUBLINGUAL | Status: DC | PRN

## 2018-06-15 MED ORDER — ACETAMINOPHEN 500 MG PO TABS
500.0000 mg | ORAL_TABLET | Freq: Four times a day (QID) | ORAL | Status: DC | PRN
  Administered 2018-06-17 (×2): 500 mg via ORAL
  Filled 2018-06-15 (×2): qty 1

## 2018-06-15 MED ORDER — RISAQUAD PO CAPS
1.0000 | ORAL_CAPSULE | Freq: Every day | ORAL | Status: DC
  Administered 2018-06-15 – 2018-06-20 (×6): 1 via ORAL
  Filled 2018-06-15 (×6): qty 1

## 2018-06-15 MED ORDER — NITROGLYCERIN PEDIATRIC IV INFUSION 100 MCG/ML: 200.0000 ug/min | INTRAVENOUS | Status: DC

## 2018-06-15 MED ORDER — HEPARIN (PORCINE) IN NACL 100-0.45 UNIT/ML-% IJ SOLN
1400.0000 [IU]/h | INTRAMUSCULAR | Status: DC
  Administered 2018-06-15: 1550 [IU]/h via INTRAVENOUS
  Administered 2018-06-16: 1400 [IU]/h via INTRAVENOUS
  Administered 2018-06-16: 1350 [IU]/h via INTRAVENOUS
  Administered 2018-06-17: 1400 [IU]/h via INTRAVENOUS
  Filled 2018-06-15 (×6): qty 250

## 2018-06-15 MED ORDER — SODIUM CHLORIDE 0.9 % IV SOLN
2.0000 g | Freq: Once | INTRAVENOUS | Status: AC
  Administered 2018-06-15: 2 g via INTRAVENOUS
  Filled 2018-06-15: qty 2

## 2018-06-15 MED ORDER — ALBUTEROL SULFATE (2.5 MG/3ML) 0.083% IN NEBU
2.5000 mg | INHALATION_SOLUTION | Freq: Four times a day (QID) | RESPIRATORY_TRACT | Status: DC | PRN
  Administered 2018-06-16 – 2018-06-17 (×4): 2.5 mg via RESPIRATORY_TRACT
  Filled 2018-06-15 (×4): qty 3

## 2018-06-15 MED ORDER — FUROSEMIDE 20 MG PO TABS
20.0000 mg | ORAL_TABLET | ORAL | Status: DC
  Administered 2018-06-18 – 2018-06-20 (×2): 20 mg via ORAL
  Filled 2018-06-15 (×2): qty 1

## 2018-06-15 MED ORDER — HEPARIN BOLUS VIA INFUSION
6000.0000 [IU] | Freq: Once | INTRAVENOUS | Status: DC
  Filled 2018-06-15: qty 6000

## 2018-06-15 MED ORDER — SODIUM CHLORIDE 0.9 % IV SOLN: 1.0000 g | Freq: Three times a day (TID) | INTRAVENOUS | Status: DC

## 2018-06-15 MED ORDER — SODIUM CHLORIDE 0.9 % IV SOLN
INTRAVENOUS | Status: DC
  Administered 2018-06-15: 100 mL/h via INTRAVENOUS

## 2018-06-15 MED ORDER — POTASSIUM CHLORIDE CRYS ER 10 MEQ PO TBCR
10.0000 meq | EXTENDED_RELEASE_TABLET | Freq: Every day | ORAL | Status: DC
  Administered 2018-06-15 – 2018-06-20 (×6): 10 meq via ORAL
  Filled 2018-06-15 (×6): qty 1

## 2018-06-15 NOTE — ED Notes (Signed)
Patient states feels better compared to before.

## 2018-06-15 NOTE — ED Notes (Signed)
Attempted report x 3.  

## 2018-06-15 NOTE — ED Notes (Signed)
EMS unable to obtain IV nurse able to get IV unable to draw blood. Notified phlebotomy.

## 2018-06-15 NOTE — H&P (Signed)
History and Physical   Lee [name] GHW:[id number] DOB: 1931/05/31 DOA: 06/15/2018  Referring MD/NP/PA: Dr. Noemi Chapel  PCP: [name], MD   Outpatient Specialists: Dr Chase Caller, Pulmonology   Patient coming from: Home  Chief Complaint: Shortness of breath  HPI: Lee [name] is a 84 y.o. male with medical history significant of idiopathic pulmonary fibrosis, pulmonary hypertension, nonischemic cardiomyopathy, hyperlipidemia, previous CVA who was brought in by family with significant shortness of breath wheezing and cough.  Patient was very hypoxic on arrival to the ER requiring BiPAP.  Patient is on home oxygen.  He was given treatment with nebulizer as well as antibiotics.  Lactic acid was noted to be 1.29.  Chest x-ray showed right upper lung pneumonia and CT chest showed pulmonary embolism.  Patient has been titrated off BiPAP while in the ER.  He was noted to have leukocytosis.  He is not able to give adequate history.  Family reported that he has left dropfoot for a long time.  He was recently seen in the ER and had bilateral Doppler ultrasound of the lower extremity due to swelling but negative.  He has decreased mobility lately.  He has an aide that usually walks with him around the house but unable to do it as he is used to.  Patient is being admitted to the hospital for work-up.  ED Course: Temperature is 102.8 with blood pressure 145/79 pulse 109 respirate of 37 oxygen sat 95% on 3 L.  ABG showed a pH of 7.433 PCO2 33 and PO2 124 on BiPAP.  White count is 16.6 hemoglobin 13.5.  Creatinine is 1.05 with glucose 122 lactic acid 1.29.  Urinalysis so far negative.  CT angiogram of the chest showed small segmental and subsegmental right lower lobe pulmonary emboli.  There is moderate to severe emphysema and consolidation with groundglass density in the posterior right upper lobe consistent with a pneumonia.  Review of Systems: As per HPI otherwise 10 point review of systems negative.      Past Medical History:  Diagnosis Date  . Allergic rhinitis   . Anxiety   . Basal cell carcinoma of face   . Cardiomyopathy ([place]) 04/2015   EF 40-45% by echo. Apical anterior akinesis seen on echo, not confirmed on Myoview was negative for ischemia. Myoview suggested a small basal inferoseptal defect.  . Cholecystitis    a. complex admit 04/2015 due to sepsis due to acute Escherichia coli cholecystitis and right lower lobe CAP and Citrobacter UTI.  [name] Chronic respiratory failure ([place])    a. on home O2 after discharge 04/2015.  [name] Chronic systolic CHF (congestive heart failure) ([place])    a. Dx 04/2015 during complex admission for cholecystitis - EF 40-45% +WMA.  [name] COPD (chronic obstructive pulmonary disease) ([place])   . Erectile dysfunction   . History of endovascular stent graft for abdominal aortic aneurysm 2002   a. s/p repair  Followed by Dr. Donnetta Hutching (stable evaluation 01/2106)  . History of hiatal hernia   . HTN (hypertension)   . Hyperlipidemia   . Interstitial lung disease ([place])    a. followed by pulm.  [name] LBBB (left bundle branch block)    a. Intermittent - seen in 2013, not in 09/2013, but again in 04/2015.  . Obesity   . On home oxygen therapy    "just at night" (10/05/2015)  . PVD (peripheral vascular disease) ([place])   . Shingles   . Stroke [facility])    a. H/o aphasia -  was told he'd had ministrokes. Imaging 09/2014 revealed prior infarct.    Past Surgical History:  Procedure Laterality Date  . AORTA SURGERY  2002  . CHOLECYSTECTOMY N/A 10/05/2015   Procedure: LAPAROSCOPIC CHOLECYSTECTOMY  ;  Surgeon: Autumn Messing III, MD;  Location: [facility];  Service: General;  Laterality: N/A;  . [place]  . INTRAOPERATIVE CHOLANGIOGRAM  10/05/2015   Procedure: INTRAOPERATIVE CHOLANGIOGRAM;  Surgeon: Autumn Messing III, MD;  Location: [facility];  Service: General;;  . LAPAROSCOPIC CHOLECYSTECTOMY  10/05/2015  . NM MYOVIEW LTD  05/2015   EF 49%. Small size, mild severity perfusion defect in the  basal-mid inferoseptal wall. LOW RISK. No ischemia. Defect thought to be related to LBBB artifacts and not true lesion. This could also explain mild systolic dysfunction.  . TRANSTHORACIC ECHOCARDIOGRAM  04/2015   (In setting of acute illness).EF 40-45% with mid-apical anteroseptal akinesis. Moderately increased PA pressures of roughly 48 mmHg.  [name] URETHRAL STRICTURE DILATATION  late 1970s   urinary tract stretch     reports that he quit smoking about 43 years ago. His smoking use included cigarettes. He has a 25.00 pack-year smoking history. He has never used smokeless tobacco. He reports that he does not drink alcohol or use drugs.  Allergies  Allergen Reactions  . Ciprofloxacin Other (See Comments)    Tingle feeling throughout body  . Tape Other (See Comments)    Redness, Please use "paper" tape    Family History  Problem Relation Age of Onset  . Hypertension Mother   . Stroke Father   . Heart disease Father   . Colon cancer Brother      Prior to Admission medications   Medication Sig Start Date End Date Taking? Authorizing Provider  Acetaminophen (TYLENOL PO) Take 600 mg by mouth as needed (headache).    Yes [provider]  albuterol (PROVENTIL HFA;VENTOLIN HFA) 108 (90 BASE) MCG/ACT inhaler Inhale 2 puffs into the lungs every 6 (six) hours as needed for wheezing or shortness of breath. 05/05/15  Yes Short, Noah Delaine, MD  carvedilol (COREG) 12.5 MG tablet TAKE 1 TABLET (12.5 MG TOTAL) BY MOUTH 2 (TWO) TIMES DAILY WITH A MEAL. Patient taking differently: Take 12.5 mg by mouth 2 (two) times daily with a meal.  08/21/17  Yes Leonie Man, MD  Cholecalciferol (VITAMIN D PO) Take 1 tablet by mouth daily.   Yes [provider]  clopidogrel (PLAVIX) 75 MG tablet Take 75 mg by mouth daily.  09/24/14  Yes [provider]  ezetimibe (ZETIA) 10 MG tablet Take 10 mg by mouth daily.    Yes [provider]  furosemide (LASIX) 20 MG tablet Take 20 mg by  mouth every Monday, Wednesday, and Friday. 05/08/18  Yes [provider]  losartan (COZAAR) 50 MG tablet Take 50 mg by mouth daily.   Yes [provider]  Multiple Vitamin (MULTIVITAMIN WITH MINERALS) TABS tablet Take 1 tablet by mouth daily.   Yes [provider]  nitroGLYCERIN (NITROSTAT) 0.4 MG SL tablet Place 1 tablet (0.4 mg total) under the tongue every 5 (five) minutes as needed for chest pain. 05/05/15  Yes Short, Noah Delaine, MD  pantoprazole (PROTONIX) 40 MG tablet Take 40 mg by mouth daily. 09/25/14  Yes [provider]  Pitavastatin Calcium (LIVALO) 2 MG TABS Take 2 mg by mouth See admin instructions. Every 3rd day    Yes [provider]  potassium chloride (MICRO-K) 10 MEQ CR capsule Take 10 mEq by  mouth every Monday, Wednesday, and Friday. **Take with lasix** 05/08/18  Yes [provider]  Probiotic Product ([place]) CAPS Take 1 capsule by mouth daily.   Yes [provider]  Tiotropium Bromide-Olodaterol (STIOLTO RESPIMAT) 2.5-2.5 MCG/ACT AERS Inhale 2 puffs into the lungs daily. 02/26/18  Yes Lauraine Rinne, NP    Physical Exam: Vitals:   06/15/18 1430 06/15/18 1445 06/15/18 1500 06/15/18 1906  BP: 128/80 132/67 (!) 145/65 136/66  Pulse: (!) 56 (!) 109 (!) 53 (!) 109  Resp: (!) 34 (!) 32 (!) 37 (!) 23  Temp:      TempSrc:      SpO2: 99% 99% 97% 96%  Weight:      Height:          Constitutional: Chronically ill looking, confused not communicating well Vitals:   06/15/18 1430 06/15/18 1445 06/15/18 1500 06/15/18 1906  BP: 128/80 132/67 (!) 145/65 136/66  Pulse: (!) 56 (!) 109 (!) 53 (!) 109  Resp: (!) 34 (!) 32 (!) 37 (!) 23  Temp:      TempSrc:      SpO2: 99% 99% 97% 96%  Weight:      Height:       Eyes: PERRL, lids and conjunctivae normal ENMT: Mucous membranes are moist. Posterior pharynx clear of any exudate or lesions.Normal dentition.  Neck: normal, supple, no masses, no  thyromegaly Respiratory: Decreased air entry bilaterally, no wheezing, diffuse crackles.  Increased respiratory effort. No accessory muscle use.  Cardiovascular: Regular rate and rhythm, no murmurs / rubs / gallops. No extremity edema. 2+ pedal pulses. No carotid bruits.  Abdomen: no tenderness, no masses palpated. No hepatosplenomegaly. Bowel sounds positive.  Musculoskeletal: no clubbing / cyanosis. No joint deformity upper and lower extremities. Good ROM, no contractures. Normal muscle tone.  Skin: no rashes, lesions, ulcers. No induration Neurologic: CN 2-12 grossly intact. Sensation intact, DTR normal. Strength 5/5 in all 4.  Psychiatric: Weak but alert and oriented x 3. Normal mood.     Labs on Admission: I have personally reviewed following labs and imaging studies  CBC: Recent Labs  Lab 06/15/18 1329  WBC 16.6*  NEUTROABS 14.3*  HGB 13.5  HCT 41.5  MCV 94.7  PLT 662   Basic Metabolic Panel: Recent Labs  Lab 06/15/18 1329  NA 138  K 3.9  CL 106  CO2 25  GLUCOSE 122*  BUN 10  CREATININE 1.05  CALCIUM 8.8*   GFR: Estimated Creatinine Clearance: 57.2 mL/min (by C-G formula based on SCr of 1.05 mg/dL). Liver Function Tests: Recent Labs  Lab 06/15/18 1329  AST 23  ALT 19  ALKPHOS 62  BILITOT 0.9  PROT 6.4*  ALBUMIN 3.5   No results for input(s): LIPASE, AMYLASE in the last 168 hours. No results for input(s): AMMONIA in the last 168 hours. Coagulation Profile: No results for input(s): INR, PROTIME in the last 168 hours. Cardiac Enzymes: No results for input(s): CKTOTAL, CKMB, CKMBINDEX, TROPONINI in the last 168 hours. BNP (last 3 results) No results for input(s): PROBNP in the last 8760 hours. HbA1C: No results for input(s): HGBA1C in the last 72 hours. CBG: No results for input(s): GLUCAP in the last 168 hours. Lipid Profile: No results for input(s): CHOL, HDL, LDLCALC, TRIG, CHOLHDL, LDLDIRECT in the last 72 hours. Thyroid Function Tests: No  results for input(s): TSH, T4TOTAL, FREET4, T3FREE, THYROIDAB in the last 72 hours. Anemia Panel: No results for input(s): VITAMINB12, FOLATE, FERRITIN, TIBC, IRON, RETICCTPCT in  the last 72 hours. Urine analysis:    Component Value Date/Time   COLORURINE AMBER (A) 04/30/2015 2123   APPEARANCEUR CLOUDY (A) 04/30/2015 2123   LABSPEC 1.021 04/30/2015 2123   PHURINE 5.5 04/30/2015 2123   GLUCOSEU NEGATIVE 04/30/2015 2123   HGBUR NEGATIVE 04/30/2015 2123   BILIRUBINUR SMALL (A) 04/30/2015 2123   KETONESUR 15 (A) 04/30/2015 2123   PROTEINUR NEGATIVE 04/30/2015 2123   UROBILINOGEN 1.0 04/30/2015 2123   NITRITE NEGATIVE 04/30/2015 2123   LEUKOCYTESUR SMALL (A) 04/30/2015 2123   Sepsis Labs: @LABRCNTIP (procalcitonin:4,lacticidven:4) )No results found for this or any previous visit (from the past 240 hour(s)).   Radiological Exams on Admission: Ct Angio Chest Pe W And/or Wo Contrast  Result Date: 06/15/2018 CLINICAL DATA:  Cough, chest pain EXAM: CT ANGIOGRAPHY CHEST WITH CONTRAST TECHNIQUE: Multidetector CT imaging of the chest was performed using the standard protocol during bolus administration of intravenous contrast. Multiplanar CT image reconstructions and MIPs were obtained to evaluate the vascular anatomy. CONTRAST:  131mL ISOVUE-370 IOPAMIDOL (ISOVUE-370) INJECTION 76% COMPARISON:  CXR 06/15/2018 FINDINGS: Cardiovascular: Satisfactory opacification of the pulmonary arteries to the segmental level. Small segmental and subsegmental right lower lobe pulmonary emboli. Moderate aortic atherosclerosis. No aneurysm. Coronary vascular calcification. Borderline cardiomegaly. No pericardial effusion Mediastinum/Nodes: Midline trachea. No thyroid mass. Esophagus within normal limits. No significant adenopathy. Moderate hiatal hernia Lungs/Pleura: Moderate severe emphysema. Ground-glass density and consolidation in the posterior right upper lobe consistent with a pneumonia. Upper Abdomen: No acute  abnormality. Musculoskeletal: No acute or suspicious abnormality. Degenerative change Review of the MIP images confirms the above findings. IMPRESSION: 1. Small segmental and subsegmental right lower lobe pulmonary emboli. 2. Moderate to severe emphysema. Consolidation and ground-glass density in the posterior right upper lobe consistent with a pneumonia Critical Value/emergent results were called by telephone at the time of interpretation on 06/15/2018 at 6:12 pm to [facility] in the ED , who verbally acknowledged these results. Aortic Atherosclerosis (ICD10-I70.0) and Emphysema (ICD10-J43.9). Electronically Signed   By: Donavan Foil M.D.   On: 06/15/2018 18:13   Dg Chest Port 1 View  Result Date: 06/15/2018 CLINICAL DATA:  Respiratory distress. EXAM: PORTABLE CHEST 1 VIEW COMPARISON:  05/07/2018 and 07/28/2017 FINDINGS: Heart size and pulmonary vascularity are within normal limits. Lungs are clear except for slight scarring at the left lung base. No effusions. No acute bone abnormality. IMPRESSION: No acute abnormality. Aortic Atherosclerosis (ICD10-I70.0). Electronically Signed   By: Lorriane Shire M.D.   On: 06/15/2018 13:18    EKG: Independently reviewed.  It shows sinus tachycardia with a rate of 110, left bundle branch block.  No significant ST changes.  Assessment/Plan Principal Problem:   Pulmonary emboli (HCC) Active Problems:   GERD (gastroesophageal reflux disease)   Essential hypertension   Hyperlipidemia with target LDL less than 100   Cardiomyopathy, nonischemic (HCC) - EF 40-45% by echo, but nearly 50% by Myoview. Anterior akinesis seen on echo not seen on Myoview.   Aspiration pneumonia (HCC)   Altered mental status     #1 pulmonary embolus: Patient has been initiated on IV heparin.  Patient will benefit from anticoagulation with Xarelto or Eliquis.  He has no history of falling.  We will admit and keep on oxygen.  May consult his pulmonologist.  #2 right upper lobe pneumonia:  Family reported that he did have some mild aspiration episode yesterday.  This seems to be consistent with aspiration pneumonia.  I will cover patient with Vanco and cefepime for aspiration pneumonia.  May  need to be transition to Zosyn or other gram-negative and anaerobic bacteria coverage.  #3 hypertension: Continue home regimen.  #4 altered mental status: Patient is less confused now.  Continue his current oxygen by nasal cannula.  #5 nonischemic cardiomyopathy: Monitor for pulmonary edema.  Continue home regiment.  #6 GERD: Continue PPIs.   DVT prophylaxis: Heparin Code Status: Full code Family Communication: Wife and daughter in the room Disposition Plan: To be determined Consults called: None Admission status: Inpatient  Severity of Illness: The appropriate patient status for this patient is INPATIENT. Inpatient status is judged to be reasonable and necessary in order to provide the required intensity of service to ensure the patient's safety. The patient's presenting symptoms, physical exam findings, and initial radiographic and laboratory data in the context of their chronic comorbidities is felt to place them at high risk for further clinical deterioration. Furthermore, it is not anticipated that the patient will be medically stable for discharge from the hospital within 2 midnights of admission. The following factors support the patient status of inpatient.   " The patient's presenting symptoms include shortness of breath. " The worrisome physical exam findings include confusion and shortness of breath. " The initial radiographic and laboratory data are worrisome because of pulmonary embolism and pneumonia. " The chronic co-morbidities include history of pulmonary fibrosis.   * I certify that at the point of admission it is my clinical judgment that the patient will require inpatient hospital care spanning beyond 2 midnights from the point of admission due to high intensity of  service, high risk for further deterioration and high frequency of surveillance required.Barbette Merino MD [facility] Pager [phone]  If 7PM-7AM, please contact night-coverage [email or web address] Password [email or web address]  06/15/2018, 7:20 PM

## 2018-06-15 NOTE — ED Notes (Signed)
Pt repositioned, cleaned, dry chuck under bottom

## 2018-06-15 NOTE — Progress Notes (Signed)
Pharmacy Antibiotic Note  Lee [name] is a 84 y.o. male admitted on 06/15/2018 with pneumonia.  Pharmacy has been consulted for vancomycin and cefepime dosing. Pt is febrile but labs are currently pending  Plan: Vancomycin 2gm IV x 1 Cefepime 2gm IV x 1 F/u labs for further maintenance doses  Height: 5\' 8"  (172.7 cm) Weight: 207 lb 3.7 oz (94 kg) IBW/kg (Calculated) : 68.4  Temp (24hrs), Avg:100.9 F (38.3 C), Min:99 F (37.2 C), Max:102.8 F (39.3 C)  No results for input(s): WBC, CREATININE, LATICACIDVEN, VANCOTROUGH, VANCOPEAK, VANCORANDOM, GENTTROUGH, GENTPEAK, GENTRANDOM, TOBRATROUGH, TOBRAPEAK, TOBRARND, AMIKACINPEAK, AMIKACINTROU, AMIKACIN in the last 168 hours.  CrCl cannot be calculated (Patient's most recent lab result is older than the maximum 21 days allowed.).    Allergies  Allergen Reactions  . Ciprofloxacin Other (See Comments)    Tingle feeling throughout body  . Tape Other (See Comments)    Redness, Please use "paper" tape    Antimicrobials this admission: Vanc 10/25>> Cefepime 10/25>>  Dose adjustments this admission: N/A  Microbiology results: Pending  Thank you for allowing pharmacy to be a part of this patient's care.  Dolce Sylvia, Rande Lawman 06/15/2018 1:33 PM

## 2018-06-15 NOTE — Progress Notes (Signed)
Pharmacy Antibiotic Note  Lee [name] is a 84 y.o. male admitted on 06/15/2018 with pneumonia.  Pharmacy has been consulted for vancomycin and cefepime dosing. Pt is febrile but labs are currently pending  Plan: Vancomycin 2gm IV x 1 Cefepime 2gm IV x 1 F/u labs for further maintenance doses  ADDENDUM:  SCr resulted at 1.05 this afternoon, CrCl~55-60.  Plan: Continue cefepime 2g IV q8h Continue vancomycin 750mg  IV q12h Monitor clinical progress, c/s, renal function F/u de-escalation plan/LOT, vancomycin trough as indicated   Height: 5\' 8"  (172.7 cm) Weight: 207 lb 3.7 oz (94 kg) IBW/kg (Calculated) : 68.4  Temp (24hrs), Avg:100.9 F (38.3 C), Min:99 F (37.2 C), Max:102.8 F (39.3 C)  Recent Labs  Lab 06/15/18 1329 06/15/18 1347  WBC 16.6*  --   CREATININE 1.05  --   LATICACIDVEN  --  1.29    Estimated Creatinine Clearance: 57.2 mL/min (by C-G formula based on SCr of 1.05 mg/dL).    Allergies  Allergen Reactions  . Ciprofloxacin Other (See Comments)    Tingle feeling throughout body  . Tape Other (See Comments)    Redness, Please use "paper" tape    Antimicrobials this admission: Vanc 10/25>> Cefepime 10/25>>  Dose adjustments this admission: N/A  Microbiology results: Pending  Thank you for allowing pharmacy to be a part of this patient's care.  Elicia Lamp P 06/15/2018 5:09 PM

## 2018-06-15 NOTE — ED Provider Notes (Addendum)
[facility] EMERGENCY DEPARTMENT Provider Note   CSN: [id number] Arrival date & time: 06/15/18  1247     History   Chief Complaint Chief Complaint  Patient presents with  . Shortness of Breath    HPI Lee [name] is a 84 y.o. male.  Who presents via EMS in respiratory distress. He has a pmh of combined emphysema and pulmonary fibrosis. Hx is gathered by EMS and review of EMR. The there is a level 5 caveat due to respiratory distress and the condition of the patient.  According to EMS the patient coming from a physicians office.  He is believed to have a history of CHF.  Patient was given was having significant difficulty breathing at the office today and was noted to be hypoxic into the 80s.  He is normally not oxygen dependent and only uses oxygen at night.  He was given albuterol treatment in the office.  He was noted to be hypertensive  HPI  Past Medical History:  Diagnosis Date  . Allergic rhinitis   . Anxiety   . Basal cell carcinoma of face   . Cardiomyopathy ([place]) 04/2015   EF 40-45% by echo. Apical anterior akinesis seen on echo, not confirmed on Myoview was negative for ischemia. Myoview suggested a small basal inferoseptal defect.  . Cholecystitis    a. complex admit 04/2015 due to sepsis due to acute Escherichia coli cholecystitis and right lower lobe CAP and Citrobacter UTI.  [name] Chronic respiratory failure ([place])    a. on home O2 after discharge 04/2015.  [name] Chronic systolic CHF (congestive heart failure) ([place])    a. Dx 04/2015 during complex admission for cholecystitis - EF 40-45% +WMA.  [name] COPD (chronic obstructive pulmonary disease) ([place])   . Erectile dysfunction   . History of endovascular stent graft for abdominal aortic aneurysm 2002   a. s/p repair  Followed by Dr. Donnetta Hutching (stable evaluation 01/2106)  . History of hiatal hernia   . HTN (hypertension)   . Hyperlipidemia   . Interstitial lung disease ([place])    a. followed by pulm.  [name] LBBB (left  bundle branch block)    a. Intermittent - seen in 2013, not in 09/2013, but again in 04/2015.  . Obesity   . On home oxygen therapy    "just at night" (10/05/2015)  . PVD (peripheral vascular disease) ([place])   . Shingles   . Stroke [facility])    a. H/o aphasia - was told he'd had ministrokes. Imaging 09/2014 revealed prior infarct.    Patient Active Problem List   Diagnosis Date Noted  . Chest pain 05/30/2017  . Stroke ([place])   . UTI (urinary tract infection) 05/02/2015  . Cardiomyopathy, nonischemic (HCC) - EF 40-45% by echo, but nearly 50% by Myoview. Anterior akinesis seen on echo not seen on Myoview. 05/02/2015  . Sepsis ([place]) 04/30/2015  . Essential hypertension 04/30/2015  . Abdominal pain 04/30/2015  . Hyperlipidemia with target LDL less than 100 04/30/2015  . CAP (community acquired pneumonia)   . IPF (idiopathic pulmonary fibrosis) ([place]) 10/22/2014  . Chronic cough 10/22/2014  . GERD (gastroesophageal reflux disease) 02/05/2013  . Other emphysema ([place]) 10/23/2012  . Idiopathic pulmonary fibrosis, high clinical pretest probability, declined surgical lung biopsy 09/13/2012  . Dyspnea on exertion 08/03/2012  . Hypercholesterolemia 08/03/2012  . Benign hypertensive heart disease without heart failure 08/03/2012  . H/O abdominal aortic aneurysm repair 08/03/2012  . Ventral hernia 08/03/2012    Past Surgical History:  Procedure Laterality Date  . AORTA SURGERY  2002  . CHOLECYSTECTOMY N/A 10/05/2015   Procedure: LAPAROSCOPIC CHOLECYSTECTOMY  ;  Surgeon: Autumn Messing III, MD;  Location: [facility];  Service: General;  Laterality: N/A;  . [place]  . INTRAOPERATIVE CHOLANGIOGRAM  10/05/2015   Procedure: INTRAOPERATIVE CHOLANGIOGRAM;  Surgeon: Autumn Messing III, MD;  Location: [facility];  Service: General;;  . LAPAROSCOPIC CHOLECYSTECTOMY  10/05/2015  . NM MYOVIEW LTD  05/2015   EF 49%. Small size, mild severity perfusion defect in the basal-mid inferoseptal wall. LOW RISK. No ischemia.  Defect thought to be related to LBBB artifacts and not true lesion. This could also explain mild systolic dysfunction.  . TRANSTHORACIC ECHOCARDIOGRAM  04/2015   (In setting of acute illness).EF 40-45% with mid-apical anteroseptal akinesis. Moderately increased PA pressures of roughly 48 mmHg.  [name] URETHRAL STRICTURE DILATATION  late 1970s   urinary tract stretch        Home Medications    Prior to Admission medications   Medication Sig Start Date End Date Taking? Authorizing Provider  Acetaminophen (TYLENOL PO) Take 600 mg by mouth.    [provider]  albuterol (PROVENTIL HFA;VENTOLIN HFA) 108 (90 BASE) MCG/ACT inhaler Inhale 2 puffs into the lungs every 6 (six) hours as needed for wheezing or shortness of breath. 05/05/15   Janece Canterbury, MD  carvedilol (COREG) 12.5 MG tablet TAKE 1 TABLET (12.5 MG TOTAL) BY MOUTH 2 (TWO) TIMES DAILY WITH A MEAL. 08/21/17   Leonie Man, MD  Cholecalciferol (VITAMIN D PO) Take 1 tablet by mouth daily.    [provider]  clopidogrel (PLAVIX) 75 MG tablet Take 75 mg by mouth daily.  09/24/14   [provider]  ezetimibe (ZETIA) 10 MG tablet Take 10 mg by mouth daily.     [provider]  losartan (COZAAR) 50 MG tablet Take 50 mg by mouth daily.    [provider]  Multiple Vitamin (MULTIVITAMIN WITH MINERALS) TABS tablet Take 1 tablet by mouth daily.    [provider]  nitroGLYCERIN (NITROSTAT) 0.4 MG SL tablet Place 1 tablet (0.4 mg total) under the tongue every 5 (five) minutes as needed for chest pain. Patient not taking: Reported on 05/07/2018 05/05/15   Janece Canterbury, MD  pantoprazole (PROTONIX) 40 MG tablet Take 40 mg by mouth daily. 09/25/14   [provider]  Pitavastatin Calcium (LIVALO) 2 MG TABS Take 2 mg by mouth See admin instructions. Every 3rd day    [provider]  Probiotic Product ([place]) CAPS Take 1 capsule by mouth daily.    [provider]  Tiotropium Bromide-Olodaterol (STIOLTO RESPIMAT) 2.5-2.5 MCG/ACT AERS Inhale 2 puffs into the lungs daily. 02/26/18   Lauraine Rinne, NP    Family History Family History  Problem Relation Age of Onset  . Hypertension Mother   . Stroke Father   . Heart disease Father   . Colon cancer Brother     Social History Social History   Tobacco Use  . Smoking status: Former Smoker    Packs/day: 1.00    Years: 25.00    Pack years: 25.00    Types: Cigarettes    Last attempt to quit: 08/22/1974    Years since quitting: 43.8  . Smokeless tobacco: Never Used  Substance Use Topics  . Alcohol use: No  . Drug use: No     Allergies   Ciprofloxacin and Tape   Review of Systems Review  of Systems   Physical Exam Updated Vital Signs There were no vitals taken for this visit.  Physical Exam   ED Treatments / Results  Labs (all labs ordered are listed, but only abnormal results are displayed) Labs Reviewed - No data to display  EKG None  Radiology No results found.  Procedures .Critical Care Performed by: [name], PA-C Authorized by: [name], PA-C   Critical care provider statement:    Critical care time (minutes):  35   Critical care was necessary to treat or prevent imminent or life-threatening deterioration of the following conditions:  Respiratory failure   Critical care was time spent personally by me on the following activities:  Discussions with consultants, evaluation of patient's response to treatment, examination of patient, ordering and performing treatments and interventions, ordering and review of laboratory studies, ordering and review of radiographic studies, pulse oximetry, re-evaluation of patient's condition, obtaining history from patient or surrogate and review of old charts   (including critical care time)  Medications Ordered in ED Medications - No data to display   Initial Impression / Assessment and Plan / ED Course  I have  reviewed the triage vital signs and the nursing notes.  Pertinent labs & imaging results that were available during my care of the patient were reviewed by me and considered in my medical decision making (see chart for details).  Clinical Course as of Jun 15 1616  Fri Jun 15, 2018  1333 Patient here with respiratory distress, history of pulmonary fibrosis and emphysema.  He is followed by Dr. Chase Caller, neurology.  The patient is noted to be febrile to 102.  Of concern for either influenza versus community-acquired pneumonia patient started on sepsis protocol.  His blood pressures are elevated and I have chosen to withhold aggressive fluid rehydration ordered a DuoNeb.  The patient is currently on BiPAP.   [AH]  1412 Patient family at bedside.  I spoke with the patient's wife and daughter who state that he has not been running fever acted like he has had flulike symptoms over the past couple days.  He was however apparently more lethargic and less responsive than normal.  He does have memory issues and difficulty with communication at baseline.  His wife also stated that he appeared to have increased bilateral peripheral edema yesterday.  She states that he always seems to get like this when he gets a urinary tract infection and that is what they think is going on with him.  I have ordered 20 mg of IV Lasix given the patient's clinical appearance with peripheral edema and also based upon the chest x-ray which appears to have some infiltrate suggestive of edema as well.   [AH]  1414 Lactic Acid, Venous: 1.29 [AH]  1414 Troponin i, poc: 0.00 [AH]  1515 WBC(!): 16.6 [AH]    Clinical Course User Index [AH] [name], PA-C    84 year old male with a history of dementia, pulmonary fibrosis and CHF who presents in respiratory distress.  The emergent differential diagnosis for shortness of breath includes, but is not limited to, Pulmonary edema, bronchoconstriction, Pneumonia, Pulmonary embolism,  Pneumotherax/ Hemothorax, Dysrythmia, ACS.  This patient presents mixed picture.  He arrived febrile and respiratory distress.  He clinically appears to be volume overloaded with peripheral edema bilaterally and rhonchi along with PA and lateral chest film that appears to have fluffy infiltrate assistant with pulmonary edema by my own as well as my attending physician's interpretation.  Secondarily the patient also appears  with cough and runny nose suggestive of viral process.  Thirdly the patient also has a history of recurrent urinary tract infections in his phlegm he feels quite sure that he has a UTI that the patient may have some other CHF, or exacerbation of his long-standing pulmonary fibrosis in the setting of other etiology of his fever.  The patient could have a pulmonary embolus as the cause of his fever and acute shortness of breath.  I reviewed the patient's labs.  His BNP is 141 not markedly elevated.  Patient does have fever and elevated white count.  Given that I initiated sepsis protocol with blood cultures and early coverage with broad-spectrum antibiotics.  He has been hypertensive during the visit and does not need massive fluid rehydration as well as having a normal lactic acid level.  Patient's troponin is negative.  His comprehensive metabolic panel shows some mildly elevated glucose and hypoproteinemia.  Patient's influenza screen is negative.  Patient stabilized on BiPAP during his visit.  He does not appear to have cardiac etiology.  I discussed admission for the patient with Dr. Cyndia Skeeters who asked that we have return on his UA as well as a CT angios of pulmonary embolus study prior to admission.  I have given sign out of the patient to the oncoming resident Dr. Lavell Islam will assume care of the patient.   Final Clinical Impressions(s) / ED Diagnoses   Final diagnoses:  None    ED Discharge Orders    None       [name], PA-C 06/15/18 1631    [name],  PA-C 06/15/18 1632    Noemi Chapel, MD 06/16/18 [phone]

## 2018-06-15 NOTE — ED Notes (Signed)
Attempted report x 1; name and call back number provided 

## 2018-06-15 NOTE — ED Notes (Signed)
Spoke with pharmacist will be sending antibiotic shortly.

## 2018-06-15 NOTE — ED Notes (Signed)
Garba at bedside

## 2018-06-15 NOTE — ED Notes (Signed)
Attempted report x 2 

## 2018-06-15 NOTE — ED Provider Notes (Signed)
Care of patient taken over by [name] at 06/15/2018 1600.   Currently CTA of the chest and UA are pending.  Plan is to  Review the results of patient's CTA and UA and then admit the patient for altered mental status, hypoxia, and respiratory distress.  Please refer to their note for final diagnosis and disposition of Lee [name].   Briefly, patient is a with a past medical history of COPD, interstitial lung disease, previous stroke, hypertension, CHF, and cardiomyopathy who presents to the emergency department after being evaluated in outpatient office.  Patient reportedly has had altered mental status for the past 2 days.  At the patient's physician's office he was reported to be in significant respiratory distress and hypoxic.  He was given multiple breathing treatments without improvement.  On arrival to the emergency department she continued to have some respiratory distress has resolved BiPAP was initiated with some improvement to his oxygenation status.  This did not improve patient's mental status.  Update: Patient CTA shows subsegmental PEs on the right as well as a right upper lobe pneumonia.  As a result patient started on heparin drip as well as Rocephin.  Patient was able to be transitioned off of BiPAP and is breathing comfortably on nasal cannula.  As result I believe patient is appropriate for the floor at this time.  Hospitalist was consulted for admission and the patient will be admitted for further treatment of pulmonary embolism and pneumonia.  The care of this patient was discussed with my attending physician Dr. Wilson Singer, who voices agreement with work-up and ED disposition.    Janit Cutter, Chanda Busing, MD 06/15/18 2022    Virgel Manifold, MD 06/16/18 [phone]

## 2018-06-15 NOTE — ED Triage Notes (Addendum)
Arrived via EMS from doctors office for shortness of breath and increased fluid abdomen. EMS administered 1 nitro SL for CHF protocol and placed on CPAP. Denies chest pain.

## 2018-06-15 NOTE — ED Provider Notes (Signed)
Medical screening examination/treatment/procedure(s) were conducted as a shared visit with non-physician practitioner(s) and myself.  I personally evaluated the patient during the encounter.  Clinical Impression:   Final diagnoses:  Respiratory distress    EKG Interpretation  Date/Time:  Friday June 15 2018 12:49:36 EDT Ventricular Rate:  110 PR Interval:    QRS Duration: 123 QT Interval:  347 QTC Calculation: 470 R Axis:   -40 Text Interpretation:  Sinus tachycardia Left bundle branch block Since last tracing rate faster Confirmed by Noemi Chapel [phone]) on 06/15/2018 3:21:59 PM        84 year old male, comes in by ambulance because of respiratory distress, he does have a history significant for pulmonary fibrosis, he is on oxygen, he presented in distress requiring increased oxygenation and on arrival is on BiPAP.  There is a possibility that he has a history of congestive heart failure.  The patient has rales on exam, he is requiring BiPAP because of increased work of breathing and distress.  The patient is critically ill  Results show a lactic acid of 1.29, troponin is negative, metabolic panel was unremarkable but the white blood cell count is 16,000, his chest x-ray does not show anything new, it just shows his ongoing underlying chronic disease.  EKG shows sinus tachycardia  The patient likely needs to be admitted for ongoing respiratory distress, nebs, antibiotics, admit  .Critical Care Performed by: Noemi Chapel, MD Authorized by: Noemi Chapel, MD   Critical care provider statement:    Critical care time (minutes):  35   Critical care time was exclusive of:  Separately billable procedures and treating other patients and teaching time   Critical care was necessary to treat or prevent imminent or life-threatening deterioration of the following conditions:  Respiratory failure   Critical care was time spent personally by me on the following activities:  Blood draw  for specimens, development of treatment plan with patient or surrogate, discussions with consultants, evaluation of patient's response to treatment, examination of patient, obtaining history from patient or surrogate, ordering and performing treatments and interventions, ordering and review of laboratory studies, ordering and review of radiographic studies, pulse oximetry, re-evaluation of patient's condition and review of old charts      Noemi Chapel, MD 06/16/18 [phone]

## 2018-06-15 NOTE — ED Notes (Signed)
Notified provider of patient's rectal temperature.

## 2018-06-15 NOTE — ED Notes (Signed)
Phlebotomy at bedside.

## 2018-06-15 NOTE — Progress Notes (Signed)
ANTICOAGULATION CONSULT NOTE  Pharmacy Consult for heparin Indication: pulmonary embolus  Heparin Dosing Weight: 88.1 kg  Labs: Recent Labs    06/15/18 1329  HGB 13.5  HCT 41.5  PLT 279  CREATININE 1.05    Assessment: 54 yom presenting with SOB, found to have small RLL PE. Pharmacy consulted to dose heparin. Not on anticoagulation PTA. CBC wnl. No bleed documented.  Goal of Therapy:  Heparin level 0.3-0.7 units/ml Monitor platelets by anticoagulation protocol: Yes   Plan:  Heparin 5000 unit bolus Start heparin at 1550 units/h 6h heparin level Daily heparin level/CBC Monitor s/sx bleeding  Elicia Lamp, PharmD, BCPS Clinical Pharmacist 06/15/2018 6:28 PM

## 2018-06-16 ENCOUNTER — Other Ambulatory Visit: Payer: Self-pay

## 2018-06-16 LAB — CBC
HCT: 34.3 % — ABNORMAL LOW (ref 39.0–52.0)
Hemoglobin: 11.6 g/dL — ABNORMAL LOW (ref 13.0–17.0)
MCH: 31.4 pg (ref 26.0–34.0)
MCHC: 33.8 g/dL (ref 30.0–36.0)
MCV: 92.7 fL (ref 80.0–100.0)
Platelets: 222 K/uL (ref 150–400)
RBC: 3.7 MIL/uL — ABNORMAL LOW (ref 4.22–5.81)
RDW: 13.3 % (ref 11.5–15.5)
WBC: 16.3 K/uL — ABNORMAL HIGH (ref 4.0–10.5)
nRBC: 0 % (ref 0.0–0.2)

## 2018-06-16 LAB — COMPREHENSIVE METABOLIC PANEL
ALT: 16 U/L (ref 0–44)
AST: 22 U/L (ref 15–41)
Albumin: 2.6 g/dL — ABNORMAL LOW (ref 3.5–5.0)
Alkaline Phosphatase: 54 U/L (ref 38–126)
Anion gap: 7 (ref 5–15)
BUN: 13 mg/dL (ref 8–23)
CO2: 23 mmol/L (ref 22–32)
Calcium: 8.2 mg/dL — ABNORMAL LOW (ref 8.9–10.3)
Chloride: 108 mmol/L (ref 98–111)
Creatinine, Ser: 1 mg/dL (ref 0.61–1.24)
GFR calc Af Amer: >60 mL/min (ref >60)
GFR calc non Af Amer: >60 mL/min (ref >60)
Glucose, Bld: 132 mg/dL — ABNORMAL HIGH (ref 70–99)
Potassium: 3.2 mmol/L — ABNORMAL LOW (ref 3.5–5.1)
Sodium: 138 mmol/L (ref 135–145)
Total Bilirubin: 0.9 mg/dL (ref 0.3–1.2)
Total Protein: 5.1 g/dL — ABNORMAL LOW (ref 6.5–8.1)

## 2018-06-16 LAB — MRSA PCR SCREENING: MRSA by PCR: NEGATIVE

## 2018-06-16 LAB — HEPARIN LEVEL (UNFRACTIONATED)
HEPARIN UNFRACTIONATED: 0.41 [IU]/mL (ref 0.30–0.70)
Heparin Unfractionated: 0.81 [IU]/mL — ABNORMAL HIGH (ref 0.30–0.70)
Heparin Unfractionated: 0.69 IU/mL (ref 0.30–0.70)

## 2018-06-16 LAB — STREP PNEUMONIAE URINARY ANTIGEN: STREP PNEUMO URINARY ANTIGEN: NEGATIVE

## 2018-06-16 LAB — HIV ANTIBODY (ROUTINE TESTING W REFLEX): HIV Screen 4th Generation wRfx: NONREACTIVE

## 2018-06-16 MED ORDER — POTASSIUM CHLORIDE CRYS ER 20 MEQ PO TBCR
40.0000 meq | EXTENDED_RELEASE_TABLET | Freq: Once | ORAL | Status: AC
  Administered 2018-06-16: 40 meq via ORAL
  Filled 2018-06-16: qty 2

## 2018-06-16 MED ORDER — ENSURE ENLIVE PO LIQD
237.0000 mL | Freq: Two times a day (BID) | ORAL | Status: DC
  Administered 2018-06-16 – 2018-06-20 (×8): 237 mL via ORAL

## 2018-06-16 NOTE — Progress Notes (Signed)
ANTICOAGULATION CONSULT NOTE  Pharmacy Consult for Heparin Indication: pulmonary embolus  Heparin Dosing Weight: 88.1 kg  Labs: Recent Labs    06/15/18 1329 06/16/18 0347  HGB 13.5 11.6*  HCT 41.5 34.3*  PLT 279 222  HEPARINUNFRC  --  0.81*  CREATININE 1.05 1.00    Assessment: 30 yom presenting with SOB, found to have small RLL PE. Pharmacy consulted to dose heparin. Not on anticoagulation PTA. CBC wnl. No bleed documented.  10/26 AM update: heparin level elevated this AM, no issues per RN.   Goal of Therapy:  Heparin level 0.3-0.7 units/ml Monitor platelets by anticoagulation protocol: Yes   Plan:  Dec heparin to 1400 units/hr 1300 HL Daily heparin level/CBC Monitor s/sx bleeding  Narda Bonds, PharmD, BCPS Clinical Pharmacist Phone: [phone]

## 2018-06-16 NOTE — Progress Notes (Signed)
ANTICOAGULATION CONSULT NOTE  Pharmacy Consult for Heparin Indication: pulmonary embolus  Heparin Dosing Weight: 88.1 kg  Labs: Recent Labs    06/15/18 1329 06/16/18 0347 06/16/18 1301 06/16/18 2247  HGB 13.5 11.6*  --   --   HCT 41.5 34.3*  --   --   PLT 279 222  --   --   HEPARINUNFRC  --  0.81* 0.69 0.41  CREATININE 1.05 1.00  --   --     Assessment: 73 yom presenting with SOB, found to have small RLL PE. Pharmacy consulted to dose heparin. Not on anticoagulation PTA. CBC wnl. No bleed documented.  10/26 PM update: heparin level therapeutic x 2 after rate decrease  Goal of Therapy:  Heparin level 0.3-0.7 units/ml Monitor platelets by anticoagulation protocol: Yes   Plan:  Cont heparin 1350 units/hr Daily heparin level/CBC Monitor s/sx bleeding  Narda Bonds, PharmD, BCPS Clinical Pharmacist Phone: [phone]

## 2018-06-16 NOTE — Progress Notes (Signed)
Patient Demographics:    Lee [name], is a 84 y.o. male, DOB - 28-Apr-1931, FGH:[id number]  Admit date - 06/15/2018   Admitting Physician Elwyn Reach, MD  Outpatient Primary MD for the patient is [name], MD  LOS - 1   Chief Complaint  Patient presents with  . Shortness of Breath        Subjective:    [name] today has no fevers, no emesis,  No chest pain, wife at bedside, questions answered, oxygen requirement is improving slowly, continues to cough  Assessment  & Plan :    Principal Problem:   Pulmonary emboli (HCC) Active Problems:   Hypercholesterolemia   GERD (gastroesophageal reflux disease)   IPF (idiopathic pulmonary fibrosis) (HCC)   Essential hypertension   Cardiomyopathy, nonischemic (HCC) - EF 40-45% by echo, but nearly 50% by Myoview. Anterior akinesis seen on echo not seen on Myoview.   Aspiration pneumonia (HCC)   Altered mental status  Brief Summary:- 84 y.o. male with medical history significant of idiopathic pulmonary fibrosis, pulmonary hypertension, nonischemic cardiomyopathy, hyperlipidemia, previous CVA and chronic hypoxic respiratory failure admitted on 06/15/2018 with fevers, cough worsening hypoxia and dyspnea and findings consistent with right-sided subsegmental and segmental PE as well as pneumonia  Plan:- 1) right-sided PE--- mostly segmental and subsegmental lobe of the right lung, continue IV heparin if no bleeding may transition to oral DOAC upon discharge home, risk versus benefit of anticoagulation discussed with patient and his wife at bedside, questions answered, awaiting echocardiogram  2) right-sided pneumonia----community-acquired pneumonia versus aspiration pneumonia, okay to stop IV vancomycin, MRSA nasal screen pending, continue cefepime patient's wife declines swallow eval as neither  the patient nor his wife would like to have the diet  modified, patient apparently had a speech and swallow evaluation earlier in 2019 without significant findings at that time, continue mucolytics and bronchodilators  3) acute on chronic hypoxic respiratory failure----at baseline patient uses 2 L of oxygen via nasal cannula due to advanced emphysema/COPD and IPF, initially in the ED patient required BiPAP, now back to nasal cannula.  Worsening oxygen requirement is secondary to #1 #2 above  4)H/o HTN/ H/o HFrEF --patient with history of nonischemic cardiac myopathy with previous EF around 45%, no evidence of acute CHF exacerbation at this time, echo pending, continue Coreg 12.5 mg twice daily, continue losartan 50 mg daily, continue Lasix 20 mg on Mondays Wednesdays and Fridays per home regimen  5) history of prior stroke--- stable, no acute neuro deficits at this time, PTA patient was on Plavix and pravastatin/zetia, may hold Plavix in view of full anticoagulation with heparin and subsequently DOAC  Disposition/Need for in-Hospital Stay- patient unable to be discharged at this time due to acute right-sided pulmonary embolism and pneumonia with worsening hypoxic respiratory failure requiring further stabilization and IV heparin  Code Status : full   Disposition Plan  : home , may need home health  Consults  :  PT  DVT Prophylaxis  : IV heparin  Lab Results  Component Value Date   PLT 222 06/16/2018    Inpatient Medications  Scheduled Meds: . acidophilus  1 capsule Oral Daily  . carvedilol  12.5 mg Oral BID WC  . cholecalciferol  1,000 Units Oral Daily  .  clopidogrel  75 mg Oral Daily  . ezetimibe  10 mg Oral Daily  . feeding supplement (ENSURE ENLIVE)  237 mL Oral BID BM  . furosemide  20 mg Oral Q M,W,F  . losartan  50 mg Oral Daily  . multivitamin with minerals  1 tablet Oral Daily  . pantoprazole  40 mg Oral Daily  . potassium chloride  10 mEq Oral Daily  . pravastatin  40 mg Oral q1800   Continuous Infusions: . ceFEPime  (MAXIPIME) IV 2 g (06/16/18 1334)  . heparin 1,400 Units/hr (06/16/18 0527)  . vancomycin 750 mg (06/16/18 0321)   PRN Meds:.acetaminophen, albuterol, LORazepam, nitroGLYCERIN    Anti-infectives (From admission, onward)   Start     Dose/Rate Route Frequency Ordered Stop   06/16/18 0300  vancomycin (VANCOCIN) IVPB 750 mg/150 ml premix     750 mg 150 mL/hr over 60 Minutes Intravenous Every 12 hours 06/15/18 1714     06/15/18 2200  ceFEPIme (MAXIPIME) 1 g in sodium chloride 0.9 % 100 mL IVPB  Status:  Discontinued     1 g 200 mL/hr over 30 Minutes Intravenous Every 8 hours 06/15/18 2034 06/15/18 2117   06/15/18 2130  ceFEPIme (MAXIPIME) 2 g in sodium chloride 0.9 % 100 mL IVPB     2 g 200 mL/hr over 30 Minutes Intravenous Every 8 hours 06/15/18 1714     06/15/18 1815  cefTRIAXone (ROCEPHIN) 1 g in sodium chloride 0.9 % 100 mL IVPB     1 g 200 mL/hr over 30 Minutes Intravenous  Once 06/15/18 1813 06/15/18 1931   06/15/18 1345  vancomycin (VANCOCIN) 2,000 mg in sodium chloride 0.9 % 500 mL IVPB     2,000 mg 250 mL/hr over 120 Minutes Intravenous  Once 06/15/18 1331 06/15/18 1639   06/15/18 1330  vancomycin (VANCOCIN) IVPB 1000 mg/200 mL premix  Status:  Discontinued     1,000 mg 200 mL/hr over 60 Minutes Intravenous  Once 06/15/18 1329 06/15/18 1331   06/15/18 1330  ceFEPIme (MAXIPIME) 2 g in sodium chloride 0.9 % 100 mL IVPB     2 g 200 mL/hr over 30 Minutes Intravenous  Once 06/15/18 1329 06/15/18 1419        Objective:   Vitals:   06/15/18 1906 06/15/18 1915 06/15/18 2058 06/16/18 0500  BP: 136/66 118/66 (!) 125/54 (!) 140/55  Pulse: (!) 109 (!) 104 88 96  Resp: (!) 23 (!) 29 (!) 24 (!) 23  Temp:   99.2 F (37.3 C) (!) 97.5 F (36.4 C)  TempSrc:   Oral   SpO2: 96% 96% 95% 95%  Weight:    97 kg  Height:        Wt Readings from Last 3 Encounters:  06/16/18 97 kg  05/07/18 94.2 kg  02/26/18 92.7 kg     Intake/Output Summary (Last 24 hours) at 06/16/2018 1346 Last  data filed at 06/16/2018 0654 Gross per 24 hour  Intake 450 ml  Output 2 ml  Net 448 ml     Physical Exam Patient is examined daily including today on 06/16/18 , exams remain the same as of yesterday except that has changed   Gen:- Awake Alert, chronically ill-appearing HEENT:- [place].AT, No sclera icterus Nose- [place] 2L/min Neck-Supple Neck,No JVD,.  Lungs-    diminished in bases with scattered rhonchi CV- S1, S2 normal, regular Abd-  +ve B.Sounds, Abd Soft, No tenderness,    Extremity/Skin:- No  edema,   good pulses Psych-affect is appropriate,  baseline cognitive deficits  Neuro-generalized weakness without  new focal deficits, no tremors   Data Review:   Micro Results Recent Results (from the past 240 hour(s))  Blood Culture (routine x 2)     Status: None (Preliminary result)   Collection Time: 06/15/18  1:36 PM  Result Value Ref Range Status   Specimen Description BLOOD RIGHT ANTECUBITAL  Final   Special Requests   Final    BOTTLES DRAWN AEROBIC AND ANAEROBIC Blood Culture adequate volume   Culture   Final    NO GROWTH < 24 HOURS Performed at [facility], [address], [place], [place] [address]    Report Status PENDING  Incomplete  Blood Culture (routine x 2)     Status: None (Preliminary result)   Collection Time: 06/15/18  1:38 PM  Result Value Ref Range Status   Specimen Description BLOOD LEFT HAND  Final   Special Requests   Final    BOTTLES DRAWN AEROBIC ONLY Blood Culture results may not be optimal due to an excessive volume of blood received in culture bottles   Culture   Final    NO GROWTH < 24 HOURS Performed at [facility], [place] [address] [place], [place] [address]    Report Status PENDING  Incomplete    Radiology Reports Ct Angio Chest Pe W And/or Wo Contrast  Result Date: 06/15/2018 CLINICAL DATA:  Cough, chest pain EXAM: CT ANGIOGRAPHY CHEST WITH CONTRAST TECHNIQUE: Multidetector CT imaging of the chest was performed using the standard  protocol during bolus administration of intravenous contrast. Multiplanar CT image reconstructions and MIPs were obtained to evaluate the vascular anatomy. CONTRAST:  185mL ISOVUE-370 IOPAMIDOL (ISOVUE-370) INJECTION 76% COMPARISON:  CXR 06/15/2018 FINDINGS: Cardiovascular: Satisfactory opacification of the pulmonary arteries to the segmental level. Small segmental and subsegmental right lower lobe pulmonary emboli. Moderate aortic atherosclerosis. No aneurysm. Coronary vascular calcification. Borderline cardiomegaly. No pericardial effusion Mediastinum/Nodes: Midline trachea. No thyroid mass. Esophagus within normal limits. No significant adenopathy. Moderate hiatal hernia Lungs/Pleura: Moderate severe emphysema. Ground-glass density and consolidation in the posterior right upper lobe consistent with a pneumonia. Upper Abdomen: No acute abnormality. Musculoskeletal: No acute or suspicious abnormality. Degenerative change Review of the MIP images confirms the above findings. IMPRESSION: 1. Small segmental and subsegmental right lower lobe pulmonary emboli. 2. Moderate to severe emphysema. Consolidation and ground-glass density in the posterior right upper lobe consistent with a pneumonia Critical Value/emergent results were called by telephone at the time of interpretation on 06/15/2018 at 6:12 pm to [facility] in the ED , who verbally acknowledged these results. Aortic Atherosclerosis (ICD10-I70.0) and Emphysema (ICD10-J43.9). Electronically Signed   By: Donavan Foil M.D.   On: 06/15/2018 18:13   Dg Chest Port 1 View  Result Date: 06/15/2018 CLINICAL DATA:  Respiratory distress. EXAM: PORTABLE CHEST 1 VIEW COMPARISON:  05/07/2018 and 07/28/2017 FINDINGS: Heart size and pulmonary vascularity are within normal limits. Lungs are clear except for slight scarring at the left lung base. No effusions. No acute bone abnormality. IMPRESSION: No acute abnormality. Aortic Atherosclerosis (ICD10-I70.0). Electronically Signed    By: Lorriane Shire M.D.   On: 06/15/2018 13:18     CBC Recent Labs  Lab 06/15/18 1329 06/16/18 0347  WBC 16.6* 16.3*  HGB 13.5 11.6*  HCT 41.5 34.3*  PLT 279 222  MCV 94.7 92.7  MCH 30.8 31.4  MCHC 32.5 33.8  RDW 13.2 13.3  LYMPHSABS 1.1  --   MONOABS 1.0  --   EOSABS  0.1  --   BASOSABS 0.0  --     Chemistries  Recent Labs  Lab 06/15/18 1329 06/16/18 0347  NA 138 138  K 3.9 3.2*  CL 106 108  CO2 25 23  GLUCOSE 122* 132*  BUN 10 13  CREATININE 1.05 1.00  CALCIUM 8.8* 8.2*  AST 23 22  ALT 19 16  ALKPHOS 62 54  BILITOT 0.9 0.9   ------------------------------------------------------------------------------------------------------------------ No results for input(s): CHOL, HDL, LDLCALC, TRIG, CHOLHDL, LDLDIRECT in the last 72 hours.  No results found for: HGBA1C ------------------------------------------------------------------------------------------------------------------ No results for input(s): TSH, T4TOTAL, T3FREE, THYROIDAB in the last 72 hours.  Invalid input(s): FREET3 ------------------------------------------------------------------------------------------------------------------ No results for input(s): VITAMINB12, FOLATE, FERRITIN, TIBC, IRON, RETICCTPCT in the last 72 hours.  Coagulation profile No results for input(s): INR, PROTIME in the last 168 hours.  No results for input(s): DDIMER in the last 72 hours.  Cardiac Enzymes No results for input(s): CKMB, TROPONINI, MYOGLOBIN in the last 168 hours.  Invalid input(s): CK ------------------------------------------------------------------------------------------------------------------    Component Value Date/Time   BNP 141.4 (H) 06/15/2018 1329     Roxan Hockey M.D on 06/16/2018 at 1:46 PM  Pager---[phone] Go to [email or web address] - password [email or web address] for contact info  [facility] - Office  [phone]

## 2018-06-16 NOTE — Progress Notes (Signed)
ANTICOAGULATION CONSULT NOTE  Pharmacy Consult for Heparin Indication: pulmonary embolus  Heparin Dosing Weight: 88.1 kg  Labs: Recent Labs    06/15/18 1329 06/16/18 0347 06/16/18 1301  HGB 13.5 11.6*  --   HCT 41.5 34.3*  --   PLT 279 222  --   HEPARINUNFRC  --  0.81* 0.69  CREATININE 1.05 1.00  --     Assessment: 30 yom presenting with SOB, found to have small RLL PE. Pharmacy consulted to dose heparin. Not on anticoagulation PTA. CBC wnl. No bleed documented.  Heparin level now therapeutic x 1 at 0.69. Hg down to 11.6, plt wnl. No bleeding or issues with infusion per discussion with RN.  Goal of Therapy:  Heparin level 0.3-0.7 units/ml Monitor platelets by anticoagulation protocol: Yes   Plan:  Decrease heparin slightly to 1350 units/hr to ensure stays in range 8h heparin level to confirm Daily heparin level/CBC Monitor s/sx bleeding F/u plans for long-term anticoagulation  Elicia Lamp, PharmD, BCPS Clinical Pharmacist Clinical phone [phone] Please check AMION for all [place] contact numbers 06/16/2018 3:25 PM

## 2018-06-17 LAB — CBC
HCT: 36.2 % — ABNORMAL LOW (ref 39.0–52.0)
Hemoglobin: 11.7 g/dL — ABNORMAL LOW (ref 13.0–17.0)
MCH: 30.2 pg (ref 26.0–34.0)
MCHC: 32.3 g/dL (ref 30.0–36.0)
MCV: 93.5 fL (ref 80.0–100.0)
Platelets: 230 10*3/uL (ref 150–400)
RBC: 3.87 MIL/uL — ABNORMAL LOW (ref 4.22–5.81)
RDW: 13.2 % (ref 11.5–15.5)
WBC: 13.8 10*3/uL — ABNORMAL HIGH (ref 4.0–10.5)
nRBC: 0 % (ref 0.0–0.2)

## 2018-06-17 LAB — HEPARIN LEVEL (UNFRACTIONATED): Heparin Unfractionated: 0.34 IU/mL (ref 0.30–0.70)

## 2018-06-17 MED ORDER — SODIUM CHLORIDE 0.9 % IV SOLN
500.0000 mg | Freq: Every day | INTRAVENOUS | Status: DC
  Administered 2018-06-17 – 2018-06-19 (×3): 500 mg via INTRAVENOUS
  Filled 2018-06-17 (×3): qty 500

## 2018-06-17 MED ORDER — ALBUTEROL SULFATE (2.5 MG/3ML) 0.083% IN NEBU
2.5000 mg | INHALATION_SOLUTION | Freq: Three times a day (TID) | RESPIRATORY_TRACT | Status: DC
  Administered 2018-06-17 – 2018-06-20 (×9): 2.5 mg via RESPIRATORY_TRACT
  Filled 2018-06-17 (×9): qty 3

## 2018-06-17 MED ORDER — BENZONATATE 100 MG PO CAPS
200.0000 mg | ORAL_CAPSULE | Freq: Two times a day (BID) | ORAL | Status: DC | PRN
  Administered 2018-06-17 (×2): 200 mg via ORAL
  Filled 2018-06-17 (×2): qty 2

## 2018-06-17 MED ORDER — GUAIFENESIN ER 600 MG PO TB12
600.0000 mg | ORAL_TABLET | Freq: Two times a day (BID) | ORAL | Status: DC
  Administered 2018-06-17 – 2018-06-18 (×3): 600 mg via ORAL
  Filled 2018-06-17 (×4): qty 1

## 2018-06-17 NOTE — Progress Notes (Signed)
ANTICOAGULATION CONSULT NOTE  Pharmacy Consult for Heparin Indication: pulmonary embolus  Heparin Dosing Weight: 88.1 kg  Labs: Recent Labs    06/15/18 1329  06/16/18 0347 06/16/18 1301 06/16/18 2247 06/17/18 0446  HGB 13.5  --  11.6*  --   --  11.7*  HCT 41.5  --  34.3*  --   --  36.2*  PLT 279  --  222  --   --  230  HEPARINUNFRC  --    < > 0.81* 0.69 0.41 0.34  CREATININE 1.05  --  1.00  --   --   --    < > = values in this interval not displayed.    Assessment: 72 yom presenting with SOB, found to have small RLL PE. Pharmacy consulted to dose heparin. Not on anticoagulation PTA. CBC stable.  Heparin level therapeutic, trending towards low end of range. MD planning DOAC on discharge. No bleeding or issues with infusion per discussion with RN.  Goal of Therapy:  Heparin level 0.3-0.7 units/ml Monitor platelets by anticoagulation protocol: Yes   Plan:  Increase heparin slightly to 1400 units/hr to ensure stays in range Daily heparin level/CBC Monitor s/sx bleeding  Elicia Lamp, PharmD, BCPS Clinical Pharmacist Clinical phone [phone] Please check AMION for all [place] contact numbers 06/17/2018 10:37 AM

## 2018-06-17 NOTE — Progress Notes (Signed)
Patient Demographics:    Lee [name], is a 84 y.o. male, DOB - 1930-09-03, KKX:[id number]  Admit date - 06/15/2018   Admitting Physician Elwyn Reach, MD  Outpatient Primary MD for the patient is [name], MD  LOS - 2   Chief Complaint  Patient presents with  . Shortness of Breath        Subjective:    Lee [name] today has no fevers, no emesis,  No chest pain, daughter at bedside, questions answered, T-max 100.9, cough and congestion persist  Assessment  & Plan :    Principal Problem:   Pulmonary emboli (HCC) Active Problems:   Hypercholesterolemia   GERD (gastroesophageal reflux disease)   IPF (idiopathic pulmonary fibrosis) (HCC)   Essential hypertension   Cardiomyopathy, nonischemic (HCC) - EF 40-45% by echo, but nearly 50% by Myoview. Anterior akinesis seen on echo not seen on Myoview.   Aspiration pneumonia (HCC)   Altered mental status  Brief Summary:- 84 y.o. male with medical history significant of idiopathic pulmonary fibrosis, pulmonary hypertension, nonischemic cardiomyopathy, hyperlipidemia, previous CVA and chronic hypoxic respiratory failure admitted on 06/15/2018 with fevers, cough worsening hypoxia and dyspnea and findings consistent with right-sided subsegmental and segmental PE as well as pneumonia  Plan:- 1) right-sided PE--- shortness of breath persist, mostly segmental and subsegmental lobe of the right lung, continue IV heparin if no bleeding may transition to oral DOAC upon discharge home, risk versus benefit of anticoagulation discussed with patient and his family, questions answered, awaiting echocardiogram  2) right-sided pneumonia----fevers and leukocytosis noted, cough and congestion persist, community-acquired pneumonia versus aspiration pneumonia, T-max 100.9, T-current 100.3,  stopped vancomycin on 06/16/18, MRSA nasal screen neg, continue cefepime  (started 06/15/18, azithromycin added 06/17/2018,  patient's wife declines swallow eval as neither  the patient nor his wife would like to have the diet modified, patient apparently had a speech and swallow evaluation earlier in 2019 without significant findings at that time, continue mucolytics and bronchodilators  3) acute on chronic hypoxic respiratory failure----at baseline patient uses 2 L of oxygen via nasal cannula due to advanced emphysema/COPD and IPF, initially in the ED patient required BiPAP, now back to nasal cannula.  Worsening oxygen requirement is secondary to #1 #2 above  4)H/o HTN/ H/o HFrEF --patient with history of nonischemic cardiac myopathy with previous EF around 45%, no evidence of acute CHF exacerbation at this time, Repeat Echo pending, continue Coreg 12.5 mg twice daily, continue losartan 50 mg daily, continue Lasix 20 mg on Mondays Wednesdays and Fridays per home regimen  5)History of prior stroke--- stable, no acute neuro deficits at this time, PTA patient was on Plavix and pravastatin/zetia, may hold Plavix in view of full anticoagulation with heparin and subsequently DOAC  Disposition/Need for in-Hospital Stay- patient unable to be discharged at this time due to acute right-sided pulmonary embolism and pneumonia with worsening hypoxic respiratory failure requiring further stabilization and IV heparin,... Fevers leukocytosis noted, needs insurance approval for transfer to SNF  Code Status : full  Disposition Plan  :  SNF  Consults  :  PT  DVT Prophylaxis  : IV heparin  Lab Results  Component Value Date   PLT 230 06/17/2018    Inpatient Medications  Scheduled Meds: .  acidophilus  1 capsule Oral Daily  . albuterol  2.5 mg Inhalation TID  . carvedilol  12.5 mg Oral BID WC  . cholecalciferol  1,000 Units Oral Daily  . ezetimibe  10 mg Oral Daily  . feeding supplement (ENSURE ENLIVE)  237 mL Oral BID BM  . furosemide  20 mg Oral Q M,W,F  . guaiFENesin  600  mg Oral BID  . losartan  50 mg Oral Daily  . multivitamin with minerals  1 tablet Oral Daily  . pantoprazole  40 mg Oral Daily  . potassium chloride  10 mEq Oral Daily  . pravastatin  40 mg Oral q1800   Continuous Infusions: . azithromycin 500 mg (06/17/18 1331)  . ceFEPime (MAXIPIME) IV 2 g (06/17/18 1241)  . heparin 1,400 Units/hr (06/17/18 1109)   PRN Meds:.acetaminophen, benzonatate, LORazepam, nitroGLYCERIN    Anti-infectives (From admission, onward)   Start     Dose/Rate Route Frequency Ordered Stop   06/17/18 1230  azithromycin (ZITHROMAX) 500 mg in sodium chloride 0.9 % 250 mL IVPB     500 mg 250 mL/hr over 60 Minutes Intravenous Daily 06/17/18 1150     06/16/18 0300  vancomycin (VANCOCIN) IVPB 750 mg/150 ml premix  Status:  Discontinued     750 mg 150 mL/hr over 60 Minutes Intravenous Every 12 hours 06/15/18 1714 06/16/18 1357   06/15/18 2200  ceFEPIme (MAXIPIME) 1 g in sodium chloride 0.9 % 100 mL IVPB  Status:  Discontinued     1 g 200 mL/hr over 30 Minutes Intravenous Every 8 hours 06/15/18 2034 06/15/18 2117   06/15/18 2130  ceFEPIme (MAXIPIME) 2 g in sodium chloride 0.9 % 100 mL IVPB     2 g 200 mL/hr over 30 Minutes Intravenous Every 8 hours 06/15/18 1714     06/15/18 1815  cefTRIAXone (ROCEPHIN) 1 g in sodium chloride 0.9 % 100 mL IVPB     1 g 200 mL/hr over 30 Minutes Intravenous  Once 06/15/18 1813 06/15/18 1931   06/15/18 1345  vancomycin (VANCOCIN) 2,000 mg in sodium chloride 0.9 % 500 mL IVPB     2,000 mg 250 mL/hr over 120 Minutes Intravenous  Once 06/15/18 1331 06/15/18 1639   06/15/18 1330  vancomycin (VANCOCIN) IVPB 1000 mg/200 mL premix  Status:  Discontinued     1,000 mg 200 mL/hr over 60 Minutes Intravenous  Once 06/15/18 1329 06/15/18 1331   06/15/18 1330  ceFEPIme (MAXIPIME) 2 g in sodium chloride 0.9 % 100 mL IVPB     2 g 200 mL/hr over 30 Minutes Intravenous  Once 06/15/18 1329 06/15/18 1419        Objective:   Vitals:   06/17/18 0730  06/17/18 0959 06/17/18 1250 06/17/18 1345  BP:  (!) 164/79  (!) 110/47  Pulse:  80  78  Resp:      Temp: 100.3 F (37.9 C)   97.8 F (36.6 C)  TempSrc: Rectal   Oral  SpO2:   98% 96%  Weight:      Height:        Wt Readings from Last 3 Encounters:  06/17/18 96.3 kg  05/07/18 94.2 kg  02/26/18 92.7 kg     Intake/Output Summary (Last 24 hours) at 06/17/2018 1555 Last data filed at 06/17/2018 1204 Gross per 24 hour  Intake 0 ml  Output -  Net 0 ml     Physical Exam Patient is examined daily including today on 06/17/18 , exams remain the same as  of yesterday except that has changed   Gen:- Awake Alert, chronically ill-appearing HEENT:- [place].AT, No sclera icterus Nose- [place] 2L/min Neck-Supple Neck,No JVD,.  Lungs-    diminished in bases with scattered rhonchi CV- S1, S2 normal, regular Abd-  +ve B.Sounds, Abd Soft, No tenderness,    Extremity/Skin:- No  edema,   good pulses Psych-affect is appropriate, baseline cognitive deficits  Neuro-generalized weakness without  new focal deficits, no tremors   Data Review:   Micro Results Recent Results (from the past 240 hour(s))  Blood Culture (routine x 2)     Status: None (Preliminary result)   Collection Time: 06/15/18  1:36 PM  Result Value Ref Range Status   Specimen Description BLOOD RIGHT ANTECUBITAL  Final   Special Requests   Final    BOTTLES DRAWN AEROBIC AND ANAEROBIC Blood Culture adequate volume   Culture   Final    NO GROWTH 2 DAYS Performed at [facility], [place] [address], [place], [place] [address]    Report Status PENDING  Incomplete  Blood Culture (routine x 2)     Status: None (Preliminary result)   Collection Time: 06/15/18  1:38 PM  Result Value Ref Range Status   Specimen Description BLOOD LEFT HAND  Final   Special Requests   Final    BOTTLES DRAWN AEROBIC ONLY Blood Culture results may not be optimal due to an excessive volume of blood received in culture bottles   Culture   Final    NO  GROWTH 2 DAYS Performed at [facility], [place] [address] [place], [place] [address]    Report Status PENDING  Incomplete  MRSA PCR Screening     Status: None   Collection Time: 06/16/18  7:02 AM  Result Value Ref Range Status   MRSA by PCR NEGATIVE NEGATIVE Final    Comment:        The GeneXpert MRSA Assay (FDA approved for NASAL specimens only), is one component of a comprehensive MRSA colonization surveillance program. It is not intended to diagnose MRSA infection nor to guide or monitor treatment for MRSA infections. Performed at [facility], [place] [address], [place], [place] [address]     Radiology Reports Ct Angio Chest Pe W And/or Wo Contrast  Result Date: 06/15/2018 CLINICAL DATA:  Cough, chest pain EXAM: CT ANGIOGRAPHY CHEST WITH CONTRAST TECHNIQUE: Multidetector CT imaging of the chest was performed using the standard protocol during bolus administration of intravenous contrast. Multiplanar CT image reconstructions and MIPs were obtained to evaluate the vascular anatomy. CONTRAST:  120mL ISOVUE-370 IOPAMIDOL (ISOVUE-370) INJECTION 76% COMPARISON:  CXR 06/15/2018 FINDINGS: Cardiovascular: Satisfactory opacification of the pulmonary arteries to the segmental level. Small segmental and subsegmental right lower lobe pulmonary emboli. Moderate aortic atherosclerosis. No aneurysm. Coronary vascular calcification. Borderline cardiomegaly. No pericardial effusion Mediastinum/Nodes: Midline trachea. No thyroid mass. Esophagus within normal limits. No significant adenopathy. Moderate hiatal hernia Lungs/Pleura: Moderate severe emphysema. Ground-glass density and consolidation in the posterior right upper lobe consistent with a pneumonia. Upper Abdomen: No acute abnormality. Musculoskeletal: No acute or suspicious abnormality. Degenerative change Review of the MIP images confirms the above findings. IMPRESSION: 1. Small segmental and subsegmental right lower lobe pulmonary emboli.  2. Moderate to severe emphysema. Consolidation and ground-glass density in the posterior right upper lobe consistent with a pneumonia Critical Value/emergent results were called by telephone at the time of interpretation on 06/15/2018 at 6:12 pm to [facility] in the ED , who verbally acknowledged these results.  Aortic Atherosclerosis (ICD10-I70.0) and Emphysema (ICD10-J43.9). Electronically Signed   By: Donavan Foil M.D.   On: 06/15/2018 18:13   Dg Chest Port 1 View  Result Date: 06/15/2018 CLINICAL DATA:  Respiratory distress. EXAM: PORTABLE CHEST 1 VIEW COMPARISON:  05/07/2018 and 07/28/2017 FINDINGS: Heart size and pulmonary vascularity are within normal limits. Lungs are clear except for slight scarring at the left lung base. No effusions. No acute bone abnormality. IMPRESSION: No acute abnormality. Aortic Atherosclerosis (ICD10-I70.0). Electronically Signed   By: Lorriane Shire M.D.   On: 06/15/2018 13:18     CBC Recent Labs  Lab 06/15/18 1329 06/16/18 0347 06/17/18 0446  WBC 16.6* 16.3* 13.8*  HGB 13.5 11.6* 11.7*  HCT 41.5 34.3* 36.2*  PLT 279 222 230  MCV 94.7 92.7 93.5  MCH 30.8 31.4 30.2  MCHC 32.5 33.8 32.3  RDW 13.2 13.3 13.2  LYMPHSABS 1.1  --   --   MONOABS 1.0  --   --   EOSABS 0.1  --   --   BASOSABS 0.0  --   --     Chemistries  Recent Labs  Lab 06/15/18 1329 06/16/18 0347  NA 138 138  K 3.9 3.2*  CL 106 108  CO2 25 23  GLUCOSE 122* 132*  BUN 10 13  CREATININE 1.05 1.00  CALCIUM 8.8* 8.2*  AST 23 22  ALT 19 16  ALKPHOS 62 54  BILITOT 0.9 0.9   ------------------------------------------------------------------------------------------------------------------ No results for input(s): CHOL, HDL, LDLCALC, TRIG, CHOLHDL, LDLDIRECT in the last 72 hours.  No results found for: HGBA1C ------------------------------------------------------------------------------------------------------------------ No results for input(s): TSH, T4TOTAL, T3FREE, THYROIDAB in the  last 72 hours.  Invalid input(s): FREET3 ------------------------------------------------------------------------------------------------------------------ No results for input(s): VITAMINB12, FOLATE, FERRITIN, TIBC, IRON, RETICCTPCT in the last 72 hours.  Coagulation profile No results for input(s): INR, PROTIME in the last 168 hours.  No results for input(s): DDIMER in the last 72 hours.  Cardiac Enzymes No results for input(s): CKMB, TROPONINI, MYOGLOBIN in the last 168 hours.  Invalid input(s): CK ------------------------------------------------------------------------------------------------------------------    Component Value Date/Time   BNP 141.4 (H) 06/15/2018 1329     Roxan Hockey M.D on 06/17/2018 at 3:55 PM  Pager---[phone] Go to [email or web address] - password [email or web address] for contact info  [facility] - Office  [phone]

## 2018-06-17 NOTE — Evaluation (Signed)
Physical Therapy Evaluation Patient Details Name: Lee [name] MRN: [id number] DOB: 07-20-1931 Today's Date: 06/17/2018   History of Present Illness  Pt is a 84 y.o. M with significant of idiopathic pulmonary fibrosis, pulmonary hypertension, nonischemic cardiomyopathy, hyperlipidemia, previous CVA and chronic hypoxic respiratory failure admitted on 10/25 with fevers, cough, worsening hypoxia and dyspnea and findings consistent with right sided subsegmental and segmental PE as well as pneumonia.  Clinical Impression  Pt admitted with above diagnosis. Pt currently with functional limitations due to the deficits listed below (see PT Problem List). Prior to admission, patient ambulates with walker and requires assist for ADL's from aide or pt wife. Currently, patient presenting with decreased functional mobility secondary to decreased endurance, weakness, and cognitive impairments. Requiring min-moderate assistance for transfers from bed to chair using walker. Pt will benefit from skilled PT to increase their independence and safety with mobility to allow discharge to the venue listed below.       Follow Up Recommendations SNF;Supervision/Assistance - 24 hour    Equipment Recommendations  None recommended by PT    Recommendations for Other Services OT consult     Precautions / Restrictions Precautions Precautions: Fall Restrictions Weight Bearing Restrictions: No      Mobility  Bed Mobility Overal bed mobility: Needs Assistance Bed Mobility: Supine to Sit     Supine to sit: Min assist     General bed mobility comments: Min assist for trunk elevation. Cues for use of bed rail  Transfers Overall transfer level: Needs assistance Equipment used: Rolling walker (2 wheeled) Transfers: Sit to/from [facility] Sit to Stand: Min assist;Mod assist Stand pivot transfers: Min assist       General transfer comment: Min-mod assist for lifting. Min assist for pivotal  steps from bed to chair with walker. Directional cues provided and assist for line management. Noted trunk flexed posture, decreased left foot clearance, and short, shuffling gait pattern. No overt LOB  Ambulation/Gait                Stairs            Wheelchair Mobility    Modified Rankin (Stroke Patients Only)       Balance Overall balance assessment: Needs assistance Sitting-balance support: No upper extremity supported;Feet supported Sitting balance-Leahy Scale: Good     Standing balance support: Bilateral upper extremity supported Standing balance-Leahy Scale: Poor Standing balance comment: reliant on external support                             Pertinent Vitals/Pain Pain Assessment: Faces Faces Pain Scale: No hurt    Home Living Family/patient expects to be discharged to:: Private residence Living Arrangements: Spouse/significant other Available Help at Discharge: Family;Personal care attendant;Available 24 hours/day Type of Home: House Home Access: Stairs to enter Entrance Stairs-Rails: Left Entrance Stairs-Number of Steps: 1 Home Layout: One level(2 steps into the den) Home Equipment: Shower seat;Hand held shower head;Cane - quad;Cane - single point;Walker - 2 wheels;Walker - 4 wheels      Prior Function Level of Independence: Needs assistance   Gait / Transfers Assistance Needed: Walks with walker  ADL's / Homemaking Assistance Needed: Aide/pt wife assists with bathing and dressing, pt wife assists with all IADL's   Comments: Aide 7 hours per day, 5 days per week. Had HHPT which ended 1 month ago.     Hand Dominance        Extremity/Trunk Assessment  Upper Extremity Assessment Upper Extremity Assessment: Generalized weakness    Lower Extremity Assessment Lower Extremity Assessment: Generalized weakness  Left Lower Extremity: baseline drop foot    Cervical / Trunk Assessment Cervical / Trunk Assessment: Kyphotic   Communication   Communication: Expressive difficulties  Cognition Arousal/Alertness: Awake/alert Behavior During Therapy: WFL for tasks assessed/performed Overall Cognitive Status: Impaired/Different from baseline Area of Impairment: Orientation                 Orientation Level: Disoriented to;Person;Place;Time;Situation             General Comments: Pt has history of cognitive impairments, however, pt daughter reports he is generally oriented x 3, but currently pt is not oriented and initially unable to state his name. Follows simple commands consistently      General Comments General comments (skin integrity, edema, etc.): VSS. Pt on 2L O2 via [place]    Exercises     Assessment/Plan    PT Assessment Patient needs continued PT services  PT Problem List Decreased strength;Decreased activity tolerance;Decreased balance;Decreased mobility;Decreased coordination;Decreased cognition       PT Treatment Interventions DME instruction;Gait training;Stair training;Functional mobility training;Therapeutic activities;Balance training;Therapeutic exercise;Patient/family education    PT Goals (Current goals can be found in the Care Plan section)  Acute Rehab PT Goals Patient Stated Goal: none stated by pt; pt daughter interested in SNF PT Goal Formulation: With patient/family Time For Goal Achievement: 07/01/18 Potential to Achieve Goals: Good    Frequency Min 3X/week   Barriers to discharge        Co-evaluation               AM-PAC PT "6 Clicks" Daily Activity  Outcome Measure Difficulty turning over in bed (including adjusting bedclothes, sheets and blankets)?: A Little Difficulty moving from lying on back to sitting on the side of the bed? : Unable Difficulty sitting down on and standing up from a chair with arms (e.g., wheelchair, bedside commode, etc,.)?: Unable Help needed moving to and from a bed to chair (including a wheelchair)?: A Little Help needed  walking in hospital room?: A Little Help needed climbing 3-5 steps with a railing? : A Lot 6 Click Score: 13    End of Session Equipment Utilized During Treatment: Gait belt;Oxygen Activity Tolerance: Patient tolerated treatment well Patient left: in chair;with call bell/phone within reach;with chair alarm set;with family/visitor present Nurse Communication: Mobility status PT Visit Diagnosis: Unsteadiness on feet (R26.81);Muscle weakness (generalized) (M62.81);Difficulty in walking, not elsewhere classified (R26.2)    Time: 6599-3570 PT Time Calculation (min) (ACUTE ONLY): 31 min   Charges:   PT Evaluation $PT Eval Moderate Complexity: 1 Mod PT Treatments $Therapeutic Activity: 8-22 mins      Ellamae Sia, PT, DPT Acute Rehabilitation Services Pager [phone] Office [phone]   Willy Eddy 06/17/2018, 1:28 PM

## 2018-06-18 ENCOUNTER — Inpatient Hospital Stay (HOSPITAL_COMMUNITY): Payer: Medicare Other

## 2018-06-18 DIAGNOSIS — I2609 Other pulmonary embolism with acute cor pulmonale: Secondary | ICD-10-CM

## 2018-06-18 DIAGNOSIS — J690 Pneumonitis due to inhalation of food and vomit: Principal | ICD-10-CM

## 2018-06-18 LAB — CBC
HCT: 35.8 % — ABNORMAL LOW (ref 39.0–52.0)
Hemoglobin: 11.9 g/dL — ABNORMAL LOW (ref 13.0–17.0)
MCH: 30.7 pg (ref 26.0–34.0)
MCHC: 33.2 g/dL (ref 30.0–36.0)
MCV: 92.5 fL (ref 80.0–100.0)
Platelets: 245 10*3/uL (ref 150–400)
RBC: 3.87 MIL/uL — ABNORMAL LOW (ref 4.22–5.81)
RDW: 13.2 % (ref 11.5–15.5)
WBC: 11.9 10*3/uL — ABNORMAL HIGH (ref 4.0–10.5)
nRBC: 0 % (ref 0.0–0.2)

## 2018-06-18 LAB — ECHOCARDIOGRAM COMPLETE
Height: 68 in
WEIGHTICAEL: 3414.4 [oz_av]

## 2018-06-18 LAB — HEPARIN LEVEL (UNFRACTIONATED): Heparin Unfractionated: 0.35 IU/mL (ref 0.30–0.70)

## 2018-06-18 MED ORDER — GUAIFENESIN 200 MG PO TABS
300.0000 mg | ORAL_TABLET | Freq: Four times a day (QID) | ORAL | Status: DC
  Administered 2018-06-18 – 2018-06-20 (×6): 300 mg via ORAL
  Filled 2018-06-18 (×10): qty 1.5

## 2018-06-18 MED ORDER — SODIUM CHLORIDE 0.9 % IV SOLN
INTRAVENOUS | Status: DC | PRN
  Administered 2018-06-18: 10 mL via INTRAVENOUS

## 2018-06-18 MED ORDER — PERFLUTREN LIPID MICROSPHERE
1.0000 mL | INTRAVENOUS | Status: AC | PRN
  Administered 2018-06-18: 4 mL via INTRAVENOUS
  Filled 2018-06-18: qty 10

## 2018-06-18 NOTE — Procedures (Signed)
Patient in chair from physical therapy. Will attempt later.

## 2018-06-18 NOTE — Progress Notes (Signed)
Patient Demographics:    Lee [name], is a 84 y.o. male, DOB - 1931-01-17, RCV:[id number]  Admit date - 06/15/2018   Admitting Physician Elwyn Reach, MD  Outpatient Primary MD for the patient is [name], MD  LOS - 3   Chief Complaint  Patient presents with  . Shortness of Breath        Subjective:    [name] today has no fevers, no emesis,  No chest pain, wife and caregiver at bedside, questions answered, no further fevers, cough and congestion improving,   Assessment  & Plan :    Principal Problem:   Pulmonary emboli (HCC) Active Problems:   Hypercholesterolemia   GERD (gastroesophageal reflux disease)   IPF (idiopathic pulmonary fibrosis) (HCC)   Essential hypertension   Cardiomyopathy, nonischemic (HCC) - EF 40-45% by echo, but nearly 50% by Myoview. Anterior akinesis seen on echo not seen on Myoview.   Aspiration pneumonia (HCC)   Altered mental status  Brief Summary:- 84 y.o. male with medical history significant of idiopathic pulmonary fibrosis, pulmonary hypertension, nonischemic cardiomyopathy, hyperlipidemia, previous CVA and chronic hypoxic respiratory failure admitted on 06/15/2018 with fevers, cough worsening hypoxia and dyspnea and findings consistent with right-sided subsegmental and segmental PE as well as pneumonia  Plan:- 1) right-sided PE--- dyspnea and congestion improving very slowly, CTA chest with mostly segmental and subsegmental lobe of the right lung, continue IV heparin, plan to transition to oral DOAC upon discharge home, risk versus benefit of anticoagulation discussed with patient and his family, questions answered, awaiting echocardiogram report  2) right-sided pneumonia----no further fevers, white count is down to 11.9, cough and congestion appears to be improving very slowly,  stopped vancomycin on 06/16/18, MRSA nasal screen neg, continue cefepime  (started 06/15/18, continue azithromycin started on 06/17/2018,  patient's wife declines swallow eval as neither  the patient nor his wife would like to have the diet modified, patient apparently had a speech and swallow evaluation earlier in 2019 without significant findings at that time, continue mucolytics and bronchodilators  3) acute on chronic hypoxic respiratory failure----at baseline patient uses 2 L of oxygen via nasal cannula due to advanced emphysema/COPD and IPF, initially in the ED patient required BiPAP, now back to nasal cannula.  Worsening oxygen requirement is secondary to #1 #2 above  4)H/o HTN/ H/o HFrEF --patient with history of nonischemic cardiac myopathy with previous EF around 45%, no evidence of acute CHF exacerbation at this time,  continue Coreg 12.5 mg twice daily, continue losartan 50 mg daily, continue Lasix 20 mg on Mondays Wednesdays and Fridays per home regimen, echocardiogram report pending  5)History of prior stroke--- stable, no acute neuro deficits at this time, PTA patient was on Plavix and pravastatin/zetia, may hold Plavix in view of full anticoagulation with heparin and subsequently DOAC  6)FEN--- concerns about dysphagia and aspiration risk, patient does better when medications are crushed and given in applesauce  Disposition/Need for in-Hospital Stay- patient unable to be discharged at this time due to acute right-sided pulmonary embolism and pneumonia with worsening hypoxic respiratory failure requiring further stabilization and IV heparin,... Awaiting insurance approval for transfer to SNF  Code Status : full  Disposition Plan  :  SNF  Consults  :  PT  DVT Prophylaxis  : IV heparin  Lab Results  Component Value Date   PLT 245 06/18/2018    Inpatient Medications  Scheduled Meds: . acidophilus  1 capsule Oral Daily  . albuterol  2.5 mg Inhalation TID  . carvedilol  12.5 mg Oral BID WC  . cholecalciferol  1,000 Units Oral Daily  . ezetimibe   10 mg Oral Daily  . feeding supplement (ENSURE ENLIVE)  237 mL Oral BID BM  . furosemide  20 mg Oral Q M,W,F  . guaiFENesin  600 mg Oral BID  . losartan  50 mg Oral Daily  . multivitamin with minerals  1 tablet Oral Daily  . pantoprazole  40 mg Oral Daily  . potassium chloride  10 mEq Oral Daily  . pravastatin  40 mg Oral q1800   Continuous Infusions: . azithromycin 500 mg (06/18/18 1014)  . ceFEPime (MAXIPIME) IV 2 g (06/18/18 1411)  . heparin 1,400 Units/hr (06/17/18 2235)   PRN Meds:.acetaminophen, benzonatate, LORazepam, nitroGLYCERIN, perflutren lipid microspheres (DEFINITY) IV suspension    Anti-infectives (From admission, onward)   Start     Dose/Rate Route Frequency Ordered Stop   06/17/18 1230  azithromycin (ZITHROMAX) 500 mg in sodium chloride 0.9 % 250 mL IVPB     500 mg 250 mL/hr over 60 Minutes Intravenous Daily 06/17/18 1150     06/16/18 0300  vancomycin (VANCOCIN) IVPB 750 mg/150 ml premix  Status:  Discontinued     750 mg 150 mL/hr over 60 Minutes Intravenous Every 12 hours 06/15/18 1714 06/16/18 1357   06/15/18 2200  ceFEPIme (MAXIPIME) 1 g in sodium chloride 0.9 % 100 mL IVPB  Status:  Discontinued     1 g 200 mL/hr over 30 Minutes Intravenous Every 8 hours 06/15/18 2034 06/15/18 2117   06/15/18 2130  ceFEPIme (MAXIPIME) 2 g in sodium chloride 0.9 % 100 mL IVPB     2 g 200 mL/hr over 30 Minutes Intravenous Every 8 hours 06/15/18 1714     06/15/18 1815  cefTRIAXone (ROCEPHIN) 1 g in sodium chloride 0.9 % 100 mL IVPB     1 g 200 mL/hr over 30 Minutes Intravenous  Once 06/15/18 1813 06/15/18 1931   06/15/18 1345  vancomycin (VANCOCIN) 2,000 mg in sodium chloride 0.9 % 500 mL IVPB     2,000 mg 250 mL/hr over 120 Minutes Intravenous  Once 06/15/18 1331 06/15/18 1639   06/15/18 1330  vancomycin (VANCOCIN) IVPB 1000 mg/200 mL premix  Status:  Discontinued     1,000 mg 200 mL/hr over 60 Minutes Intravenous  Once 06/15/18 1329 06/15/18 1331   06/15/18 1330  ceFEPIme  (MAXIPIME) 2 g in sodium chloride 0.9 % 100 mL IVPB     2 g 200 mL/hr over 30 Minutes Intravenous  Once 06/15/18 1329 06/15/18 1419        Objective:   Vitals:   06/17/18 1944 06/18/18 0708 06/18/18 0839 06/18/18 1404  BP:  (!) 143/71    Pulse:      Resp:      Temp:  98 F (36.7 C)    TempSrc:  Oral    SpO2: 96%  95% 94%  Weight:  96.8 kg    Height:        Wt Readings from Last 3 Encounters:  06/18/18 96.8 kg  05/07/18 94.2 kg  02/26/18 92.7 kg     Intake/Output Summary (Last 24 hours) at 06/18/2018 1536 Last data filed at 06/18/2018 1300 Gross per 24 hour  Intake 480 ml  Output 300 ml  Net 180 ml     Physical Exam Patient is examined daily including today on 06/18/18 , exams remain the same as of yesterday except that has changed   Gen:- Awake Alert, chronically ill-appearing HEENT:- [place].AT, No sclera icterus Ears- HOH Nose- [place] 2L/min Neck-Supple Neck,No JVD,.  Lungs-    diminished in bases with scattered rhonchi, less congested CV- S1, S2 normal, regular Abd-  +ve B.Sounds, Abd Soft, No tenderness,    Extremity/Skin:- No  edema,   good pulses Psych-affect is appropriate, baseline cognitive deficits  Neuro-generalized weakness without  new focal deficits, no tremors   Data Review:   Micro Results Recent Results (from the past 240 hour(s))  Blood Culture (routine x 2)     Status: None (Preliminary result)   Collection Time: 06/15/18  1:36 PM  Result Value Ref Range Status   Specimen Description BLOOD RIGHT ANTECUBITAL  Final   Special Requests   Final    BOTTLES DRAWN AEROBIC AND ANAEROBIC Blood Culture adequate volume   Culture   Final    NO GROWTH 3 DAYS Performed at [facility], [place] [address], [place], [place] [address]    Report Status PENDING  Incomplete  Blood Culture (routine x 2)     Status: None (Preliminary result)   Collection Time: 06/15/18  1:38 PM  Result Value Ref Range Status   Specimen Description BLOOD LEFT HAND  Final    Special Requests   Final    BOTTLES DRAWN AEROBIC ONLY Blood Culture results may not be optimal due to an excessive volume of blood received in culture bottles   Culture   Final    NO GROWTH 3 DAYS Performed at [facility], [place] [address], [place], [place] [address]    Report Status PENDING  Incomplete  MRSA PCR Screening     Status: None   Collection Time: 06/16/18  7:02 AM  Result Value Ref Range Status   MRSA by PCR NEGATIVE NEGATIVE Final    Comment:        The GeneXpert MRSA Assay (FDA approved for NASAL specimens only), is one component of a comprehensive MRSA colonization surveillance program. It is not intended to diagnose MRSA infection nor to guide or monitor treatment for MRSA infections. Performed at [facility], [place] [address], [place], [place] [address]     Radiology Reports Ct Angio Chest Pe W And/or Wo Contrast  Result Date: 06/15/2018 CLINICAL DATA:  Cough, chest pain EXAM: CT ANGIOGRAPHY CHEST WITH CONTRAST TECHNIQUE: Multidetector CT imaging of the chest was performed using the standard protocol during bolus administration of intravenous contrast. Multiplanar CT image reconstructions and MIPs were obtained to evaluate the vascular anatomy. CONTRAST:  171mL ISOVUE-370 IOPAMIDOL (ISOVUE-370) INJECTION 76% COMPARISON:  CXR 06/15/2018 FINDINGS: Cardiovascular: Satisfactory opacification of the pulmonary arteries to the segmental level. Small segmental and subsegmental right lower lobe pulmonary emboli. Moderate aortic atherosclerosis. No aneurysm. Coronary vascular calcification. Borderline cardiomegaly. No pericardial effusion Mediastinum/Nodes: Midline trachea. No thyroid mass. Esophagus within normal limits. No significant adenopathy. Moderate hiatal hernia Lungs/Pleura: Moderate severe emphysema. Ground-glass density and consolidation in the posterior right upper lobe consistent with a pneumonia. Upper Abdomen: No acute abnormality. Musculoskeletal: No  acute or suspicious abnormality. Degenerative change Review of the MIP images confirms the above findings. IMPRESSION: 1. Small segmental and subsegmental right lower lobe pulmonary emboli. 2. Moderate to severe emphysema. Consolidation and ground-glass density in the posterior  right upper lobe consistent with a pneumonia Critical Value/emergent results were called by telephone at the time of interpretation on 06/15/2018 at 6:12 pm to [employer] in the ED , who verbally acknowledged these results. Aortic Atherosclerosis (ICD10-I70.0) and Emphysema (ICD10-J43.9). Electronically Signed   By: Donavan Foil M.D.   On: 06/15/2018 18:13   Dg Chest Port 1 View  Result Date: 06/15/2018 CLINICAL DATA:  Respiratory distress. EXAM: PORTABLE CHEST 1 VIEW COMPARISON:  05/07/2018 and 07/28/2017 FINDINGS: Heart size and pulmonary vascularity are within normal limits. Lungs are clear except for slight scarring at the left lung base. No effusions. No acute bone abnormality. IMPRESSION: No acute abnormality. Aortic Atherosclerosis (ICD10-I70.0). Electronically Signed   By: Lorriane Shire M.D.   On: 06/15/2018 13:18     CBC Recent Labs  Lab 06/15/18 1329 06/16/18 0347 06/17/18 0446 06/18/18 0556  WBC 16.6* 16.3* 13.8* 11.9*  HGB 13.5 11.6* 11.7* 11.9*  HCT 41.5 34.3* 36.2* 35.8*  PLT 279 222 230 245  MCV 94.7 92.7 93.5 92.5  MCH 30.8 31.4 30.2 30.7  MCHC 32.5 33.8 32.3 33.2  RDW 13.2 13.3 13.2 13.2  LYMPHSABS 1.1  --   --   --   MONOABS 1.0  --   --   --   EOSABS 0.1  --   --   --   BASOSABS 0.0  --   --   --     Chemistries  Recent Labs  Lab 06/15/18 1329 06/16/18 0347  NA 138 138  K 3.9 3.2*  CL 106 108  CO2 25 23  GLUCOSE 122* 132*  BUN 10 13  CREATININE 1.05 1.00  CALCIUM 8.8* 8.2*  AST 23 22  ALT 19 16  ALKPHOS 62 54  BILITOT 0.9 0.9   ------------------------------------------------------------------------------------------------------------------ No results for input(s): CHOL, HDL,  LDLCALC, TRIG, CHOLHDL, LDLDIRECT in the last 72 hours.  No results found for: HGBA1C ------------------------------------------------------------------------------------------------------------------ No results for input(s): TSH, T4TOTAL, T3FREE, THYROIDAB in the last 72 hours.  Invalid input(s): FREET3 ------------------------------------------------------------------------------------------------------------------ No results for input(s): VITAMINB12, FOLATE, FERRITIN, TIBC, IRON, RETICCTPCT in the last 72 hours.  Coagulation profile No results for input(s): INR, PROTIME in the last 168 hours.  No results for input(s): DDIMER in the last 72 hours.  Cardiac Enzymes No results for input(s): CKMB, TROPONINI, MYOGLOBIN in the last 168 hours.  Invalid input(s): CK ------------------------------------------------------------------------------------------------------------------    Component Value Date/Time   BNP 141.4 (H) 06/15/2018 1329     Roxan Hockey M.D on 06/18/2018 at 3:36 PM  Pager---[phone] Go to [email or web address] - password [email or web address] for contact info  [facility] - Office  [phone]

## 2018-06-18 NOTE — Clinical Social Work Note (Signed)
Clinical Social Work Assessment  Patient Details  Name: Lee [name] MRN: [id number] Date of Birth: 19-Jul-1931  Date of referral:  06/18/18               Reason for consult:  Facility Placement, Discharge Planning                Permission sought to share information with:  Facility Sport and exercise psychologist, Family Supports Permission granted to share information::  No(patient not fully oriented, caregiver at bedside)  Name::     Shanda Bumps  Agency::  SNFs  Relationship::  spouse  Contact Information:  [phone]  Housing/Transportation Living arrangements for the past 2 months:  Single Family Home Source of Information:  Patient Patient Interpreter Needed:  None Criminal Activity/Legal Involvement Pertinent to Current Situation/Hospitalization:  No - Comment as needed Significant Relationships:  Spouse, Adult Children Lives with:  Spouse Do you feel safe going back to the place where you live?  Yes Need for family participation in patient care:  Yes (Comment)  Care giving concerns: Patient from home with wife. PT recommending SNF.   Social Worker assessment / plan: CSW met with patient and caregiver, Onalee Hua, at bedside. Patient only oriented to self and place, and not engaged with CSW. CSW introduced self and role and discussed disposition planning with caregiver. Onalee Hua indicated that patient's wife is agreeable to SNF and prefers [facility] or [facility].   CSW sent initial referrals to SNFs, awaiting bed offers. CSW to provide bed offers to family when available. Patient will require [facility] authorization before admitting to facility. When facility identified, [facility] Kauf[facility] will be started. CSW to follow and support with discharge planning.   Employment status:  Retired Research officer, political party) PT Recommendations:  [place] / Referral to community resources:  [place]  Patient/Family's Response to care: Patient's family  appreciative of care.  Patient/Family's Understanding of and Emotional Response to Diagnosis, Current Treatment, and Prognosis: Patient's caregiver with understanding of patient's condition and recommendation for rehab. Family agreeable to SNF.  Emotional Assessment Appearance:  Appears stated age Attitude/Demeanor/Rapport:  Other(alert, but not engaged) Affect (typically observed):  Unable to Assess Orientation:  Oriented to Self, Oriented to Place Alcohol / Substance use:  Not Applicable Psych involvement (Current and /or in the community):  No (Comment)  Discharge Needs  Concerns to be addressed:  Discharge Planning Concerns, Care Coordination Readmission within the last 30 days:  No Current discharge risk:  Physical Impairment Barriers to Discharge:  [place], [place] 06/18/2018, 12:20 PM

## 2018-06-18 NOTE — Progress Notes (Signed)
ANTICOAGULATION CONSULT NOTE - Follow Up Consult  Pharmacy Consult for Heparin and Cefepime Indication: PE and PNA  Allergies  Allergen Reactions  . Ciprofloxacin Other (See Comments)    Tingle feeling throughout body  . Tape Other (See Comments)    Redness, Please use "paper" tape    Patient Measurements: Height: 5\' 8"  (172.7 cm) Weight: 213 lb 6.4 oz (96.8 kg) IBW/kg (Calculated) : 68.4 Heparin Dosing Weight:  88.1 kg  Vital Signs: Temp: 98 F (36.7 C) (10/28 0708) Temp Source: Oral (10/28 0708) BP: 143/71 (10/28 0708)  Labs: Recent Labs    06/15/18 1329 06/16/18 0347  06/16/18 2247 06/17/18 0446 06/18/18 0556  HGB 13.5 11.6*  --   --  11.7* 11.9*  HCT 41.5 34.3*  --   --  36.2* 35.8*  PLT 279 222  --   --  230 245  HEPARINUNFRC  --  0.81*   < > 0.41 0.34 0.35  CREATININE 1.05 1.00  --   --   --   --    < > = values in this interval not displayed.    Estimated Creatinine Clearance: 61 mL/min (by C-G formula based on SCr of 1 mg/dL).  Assessment:  Anticoag: hep for new PE. No ac pta. Cbc stable. HL 0.35 in goal.  ID: cefepime D#4 for PNA - Tmax 100.3, LA 1.29, wbc down to 11.9, Scr WNL.  Vanc 10/25>>10/26 Cefepime 10/25>> Azithro 10/27>>  10/25 flu - neg 10/26 strep pneumo - neg 10/25 bcx - ngtd  Goal of Therapy:  Heparin level 0.3-0.7 units/ml Monitor platelets by anticoagulation protocol: Yes  Eradication of infection   Plan:  Cefepime 2g IV q8h Azithro 500mg  IV q 24h Continue IV heparin at 1400 units/hr Daily heparin level/CBC F/u plans for long-term anticoagulation   Yussef Jorge S. [place], PharmD, BCPS Clinical Staff Pharmacist Pager [phone]  [place], [place] 06/18/2018,8:43 AM

## 2018-06-18 NOTE — Progress Notes (Signed)
Physical Therapy Treatment Patient Details Name: Lee [name] MRN: [id number] DOB: Apr 12, 1931 Today's Date: 06/18/2018    History of Present Illness Pt is a 84 y.o. M with significant of idiopathic pulmonary fibrosis, pulmonary hypertension, nonischemic cardiomyopathy, hyperlipidemia, previous CVA and chronic hypoxic respiratory failure admitted on 10/25 with fevers, cough, worsening hypoxia and dyspnea and findings consistent with right sided subsegmental and segmental PE as well as pneumonia.    PT Comments    Patient progressing well towards PT goals. Tolerated bed mobility, transfers and gait training with Min-mod A for safety. Pt with hx of cognitive deficits but per caregiver present in room, pt at baseline. Pt with 2/4 DOE throughout but VSS with Sp02 remaining >91% on RA. Notified RN that supplemental 02 was removed at rest in room. Will continue to follow and progress as tolerated.   Follow Up Recommendations  SNF;Supervision/Assistance - 24 hour     Equipment Recommendations  None recommended by PT    Recommendations for Other Services       Precautions / Restrictions Precautions Precautions: Fall Restrictions Weight Bearing Restrictions: No    Mobility  Bed Mobility Overal bed mobility: Needs Assistance Bed Mobility: Supine to Sit     Supine to sit: Mod assist;HOB elevated     General bed mobility comments: Cues for technique, sequencing- assist to elevate trunk and scoot to EOB. 2/4 DOE.   Transfers Overall transfer level: Needs assistance Equipment used: Rolling walker (2 wheeled) Transfers: Sit to/from Stand Sit to Stand: Mod assist;From elevated surface         General transfer comment: Assist to stand from EOB x1, from chair x2 with posterior bias and LEs leaning on bed rail/chair. Transferred to chair post ambulation.   Ambulation/Gait Ambulation/Gait assistance: Mod assist;+2 safety/equipment Gait Distance (Feet): 10 Feet(+ 5') Assistive  device: Rolling walker (2 wheeled) Gait Pattern/deviations: Decreased dorsiflexion - left;Step-through pattern;Step-to pattern;Decreased stride length;Trunk flexed;Decreased step length - left;Leaning posteriorly;Steppage Gait velocity: decreased   General Gait Details: Slow, unsteady gait with assist for RW management/forward momentum of advancing LE; left foot drop which is chronic utilizing steppage technique to advance LLE. Fatigues. 1 seated rest break. Sp02 remained >91% on RA. 2/4 DOE.   Stairs             Wheelchair Mobility    Modified Rankin (Stroke Patients Only)       Balance Overall balance assessment: Needs assistance Sitting-balance support: No upper extremity supported;Feet supported Sitting balance-Leahy Scale: Fair Sitting balance - Comments: Initially Min A progressing to min guard.    Standing balance support: During functional activity Standing balance-Leahy Scale: Poor Standing balance comment: reliant on external support and RW.                             Cognition Arousal/Alertness: Awake/alert Behavior During Therapy: WFL for tasks assessed/performed Overall Cognitive Status: History of cognitive impairments - at baseline                                 General Comments: Caregiver present in room and states pt at cognitive baseline. Follows simple 1 step commands consistently with repetition and increased time. HOH.      Exercises      General Comments General comments (skin integrity, edema, etc.): Caregiver present in room.      Pertinent Vitals/Pain Pain Assessment: Faces Faces Pain Scale: No  hurt    Home Living                      Prior Function            PT Goals (current goals can now be found in the care plan section) Progress towards PT goals: Progressing toward goals    Frequency    Min 3X/week      PT Plan Current plan remains appropriate    Co-evaluation PT/OT/SLP  Co-Evaluation/Treatment: Yes Reason for Co-Treatment: For patient/therapist safety;Necessary to address cognition/behavior during functional activity;To address functional/ADL transfers PT goals addressed during session: Mobility/safety with mobility;Proper use of DME;Balance        AM-PAC PT "6 Clicks" Daily Activity  Outcome Measure  Difficulty turning over in bed (including adjusting bedclothes, sheets and blankets)?: Unable Difficulty moving from lying on back to sitting on the side of the bed? : Unable Difficulty sitting down on and standing up from a chair with arms (e.g., wheelchair, bedside commode, etc,.)?: Unable Help needed moving to and from a bed to chair (including a wheelchair)?: A Little Help needed walking in hospital room?: A Lot Help needed climbing 3-5 steps with a railing? : A Lot 6 Click Score: 10    End of Session Equipment Utilized During Treatment: Gait belt Activity Tolerance: Patient limited by fatigue;Patient tolerated treatment well Patient left: in chair;with call bell/phone within reach;with family/visitor present Nurse Communication: Mobility status PT Visit Diagnosis: Unsteadiness on feet (R26.81);Muscle weakness (generalized) (M62.81);Difficulty in walking, not elsewhere classified (R26.2)     Time: 9509-3267 PT Time Calculation (min) (ACUTE ONLY): 24 min  Charges:  $Gait Training: 8-22 mins                     Wray Kearns, PT, DPT Acute Rehabilitation Services Pager [phone] Office [phone]       [place] 06/18/2018, 12:08 PM

## 2018-06-18 NOTE — Progress Notes (Signed)
  Echocardiogram 2D Echocardiogram with definity has been performed.  Darlina Sicilian M 06/18/2018, 2:59 PM

## 2018-06-18 NOTE — Evaluation (Signed)
Occupational Therapy Evaluation Patient Details Name: Lee [name] MRN: [id number] DOB: 02-14-1931 Today's Date: 06/18/2018    History of Present Illness 84 y.o. M with significant of idiopathic pulmonary fibrosis, pulmonary hypertension, nonischemic cardiomyopathy, hyperlipidemia, previous CVA and chronic hypoxic respiratory failure admitted on 10/25 with fevers, cough, worsening hypoxia and dyspnea and findings consistent with right sided subsegmental and segmental PE as well as pneumonia.   Clinical Impression   PT admitted with hypoxia due to PE and PNA. Pt currently with functional limitiations due to the deficits listed below (see OT problem list). Pt currently cognitively baseline per caregiver and requires (A) with adls at mod (A). Pt fatigues quickly and requires rest breaks. Pt was walking 0.5 miles per day with caregiver prior to admission.  Pt will benefit from skilled OT to increase their independence and safety with adls and balance to allow discharge SNF.     Follow Up Recommendations  SNF    Equipment Recommendations  3 in 1 bedside commode    Recommendations for Other Services       Precautions / Restrictions Precautions Precautions: Fall Restrictions Weight Bearing Restrictions: No      Mobility Bed Mobility Overal bed mobility: Needs Assistance Bed Mobility: Supine to Sit     Supine to sit: Mod assist;HOB elevated     General bed mobility comments: Cues for technique, sequencing- assist to elevate trunk and scoot to EOB. 2/4 DOE.   Transfers Overall transfer level: Needs assistance Equipment used: Rolling walker (2 wheeled) Transfers: Sit to/from Stand Sit to Stand: Mod assist;From elevated surface Stand pivot transfers: Min assist       General transfer comment: Assist to stand from EOB x1, from chair x2 with posterior bias and LEs leaning on bed rail/chair. Transferred to chair post ambulation. pt needs mod cues for hand placement      Balance Overall balance assessment: Needs assistance Sitting-balance support: No upper extremity supported;Feet supported Sitting balance-Leahy Scale: Fair Sitting balance - Comments: Initially Min A progressing to min guard.    Standing balance support: During functional activity Standing balance-Leahy Scale: Poor Standing balance comment: reliant on external support and RW.                            ADL either performed or assessed with clinical judgement   ADL Overall ADL's : Needs assistance/impaired Eating/Feeding: Moderate assistance Eating/Feeding Details (indicate cue type and reason): obsevered care givere helping patient with breakfast prior to evaluation Grooming: Moderate assistance   Upper Body Bathing: Moderate assistance   Lower Body Bathing: Maximal assistance   Upper Body Dressing : Moderate assistance   Lower Body Dressing: Maximal assistance   Toilet Transfer: Moderate assistance Toilet Transfer Details (indicate cue type and reason): heavy lean on surface posterior with bil LE Toileting- Clothing Manipulation and Hygiene: Maximal assistance       Functional mobility during ADLs: Moderate assistance;Rolling walker General ADL Comments: caregiver reports that baseline (A) with adls. pt was walking 0.5 miles per day prior to admission     Vision         Perception     Praxis      Pertinent Vitals/Pain Pain Assessment: No/denies pain Faces Pain Scale: No hurt     Hand Dominance Right   Extremity/Trunk Assessment Upper Extremity Assessment Upper Extremity Assessment: Generalized weakness   Lower Extremity Assessment Lower Extremity Assessment: Defer to PT evaluation   Cervical / Trunk Assessment Cervical /  Trunk Assessment: Kyphotic   Communication Communication Communication: Expressive difficulties   Cognition Arousal/Alertness: Awake/alert Behavior During Therapy: WFL for tasks assessed/performed Overall Cognitive  Status: History of cognitive impairments - at baseline Area of Impairment: Orientation                 Orientation Level: Disoriented to;Person;Place;Time;Situation             General Comments: Caregiver present in room and states pt at cognitive baseline. Follows simple 1 step commands consistently with repetition and increased time. HOH.   General Comments  currently with incontinence and needs frequent checks for incontinence    Exercises     Shoulder Instructions      Home Living Family/patient expects to be discharged to:: Private residence Living Arrangements: Spouse/significant other Available Help at Discharge: Family;Personal care attendant;Available 24 hours/day Type of Home: House Home Access: Stairs to enter [employer] of Steps: 1 Entrance Stairs-Rails: Left Home Layout: One level(2 steps into the den)     [employer] Shower/Tub: Walk-in shower         Home Equipment: Shower seat;Hand held shower head;Cane - quad;Cane - single point;Walker - 2 wheels;Walker - 4 wheels          Prior Functioning/Environment Level of Independence: Needs assistance  Gait / Transfers Assistance Needed: Walks with walker ADL's / Homemaking Assistance Needed: Aide/pt wife assists with bathing and dressing, pt wife assists with all IADL's    Comments: Aide 7 hours per day, 5 days per week. Had HHPT which ended 1 month ago.        OT Problem List: Decreased strength;Decreased range of motion;Decreased activity tolerance;Impaired balance (sitting and/or standing);Impaired vision/perception;Decreased coordination;Decreased cognition;Decreased safety awareness;Decreased knowledge of use of DME or AE;Decreased knowledge of precautions;Cardiopulmonary status limiting activity      OT Treatment/Interventions: Self-care/ADL training;Therapeutic exercise;Neuromuscular education;Energy conservation;DME and/or AE instruction;Manual therapy;Modalities;Therapeutic  activities;Cognitive remediation/compensation;Patient/family education;Balance training    OT Goals(Current goals can be found in the care plan section) Acute Rehab OT Goals Patient Stated Goal: none stated by pt; pt daughter interested in SNF OT Goal Formulation: Patient unable to participate in goal setting Time For Goal Achievement: 07/02/18 Potential to Achieve Goals: Good  OT Frequency: Min 2X/week   Barriers to D/C:            Co-evaluation PT/OT/SLP Co-Evaluation/Treatment: Yes Reason for Co-Treatment: Complexity of the patient's impairments (multi-system involvement);Necessary to address cognition/behavior during functional activity;For patient/therapist safety;To address functional/ADL transfers PT goals addressed during session: Mobility/safety with mobility;Proper use of DME;Balance OT goals addressed during session: ADL's and self-care;Proper use of Adaptive equipment and DME;Strengthening/ROM      AM-PAC PT "6 Clicks" Daily Activity     Outcome Measure Help from another person eating meals?: A Lot Help from another person taking care of personal grooming?: A Lot Help from another person toileting, which includes using toliet, bedpan, or urinal?: A Lot Help from another person bathing (including washing, rinsing, drying)?: A Lot Help from another person to put on and taking off regular upper body clothing?: A Lot Help from another person to put on and taking off regular lower body clothing?: Total 6 Click Score: 11   End of Session Equipment Utilized During Treatment: Gait belt;Rolling walker Nurse Communication: Mobility status;Precautions  Activity Tolerance: Patient tolerated treatment well Patient left: in chair;with call bell/phone within reach;with chair alarm set;with family/visitor present  OT Visit Diagnosis: Unsteadiness on feet (R26.81);Muscle weakness (generalized) (M62.81)  Time: 1017-1040 OT Time Calculation (min): 23 min Charges:  OT  General Charges $OT Visit: 1 Visit OT Evaluation $OT Eval Moderate Complexity: 1 Mod   Jeri Modena, OTR/L  Acute Rehabilitation Services Pager: [phone] Office: [phone] .   Parke Poisson B 06/18/2018, 2:21 PM

## 2018-06-18 NOTE — Progress Notes (Addendum)
CSW discussed SNF bed offers with patient's wife, Enid Derry, on the phone. Their first choices, [facility] do not have beds available. CSW reviewed alternate bed offers. Wife to review and follow up with choice. Wife is not familiar with facilities and plans to do some research. Patient will require [facility] authorization once facility is identified.  Wife also considering patient coming home, as she is concerned about locations of the available facilities being too far from home. They have a caregiver that assists at home 5 days per week for 7 hours per day. CSW advised that 24 hour supervision/assistance is recommended.   CSW to follow for family's choice and will support with discharge planning.  Estanislado Emms, [place]

## 2018-06-18 NOTE — NC FL2 (Signed)
[place] MEDICAID FL2 LEVEL OF CARE SCREENING TOOL     IDENTIFICATION  Patient Name: Lee [name] Birthdate: 26-Jan-1931 Sex: male Admission Date (Current Location): 06/15/2018  [facility] and [place] Number:  Herbalist and Address:  The . [facility], [place] [address], [place], [place] [address]      Provider Number: [id number]  Attending Physician Name and Address:  Roxan Hockey, MD  Relative Name and Phone Number:  Gurvir Schrom, spouse, [phone]    Current Level of Care: Hospital Recommended Level of Care: [place] Prior Approval Number:    Date Approved/Denied:   PASRR Number: [id number]  Discharge Plan: SNF    Current Diagnoses: Patient Active Problem List   Diagnosis Date Noted  . Pulmonary emboli ([place]) 06/15/2018  . Aspiration pneumonia ([place]) 06/15/2018  . Altered mental status 06/15/2018  . Chest pain 05/30/2017  . Stroke ([place])   . UTI (urinary tract infection) 05/02/2015  . Cardiomyopathy, nonischemic (HCC) - EF 40-45% by echo, but nearly 50% by Myoview. Anterior akinesis seen on echo not seen on Myoview. 05/02/2015  . Sepsis ([place]) 04/30/2015  . Essential hypertension 04/30/2015  . Abdominal pain 04/30/2015  . Hyperlipidemia with target LDL less than 100 04/30/2015  . CAP (community acquired pneumonia)   . IPF (idiopathic pulmonary fibrosis) ([place]) 10/22/2014  . Chronic cough 10/22/2014  . GERD (gastroesophageal reflux disease) 02/05/2013  . Other emphysema ([place]) 10/23/2012  . Idiopathic pulmonary fibrosis, high clinical pretest probability, declined surgical lung biopsy 09/13/2012  . Dyspnea on exertion 08/03/2012  . Hypercholesterolemia 08/03/2012  . Benign hypertensive heart disease without heart failure 08/03/2012  . H/O abdominal aortic aneurysm repair 08/03/2012  . Ventral hernia 08/03/2012    Orientation RESPIRATION BLADDER Height & Weight     Self, Time, Place  O2(nasal cannula 3L)  Incontinent Weight: 96.8 kg Height:  5\' 8"  (172.7 cm)  BEHAVIORAL SYMPTOMS/MOOD NEUROLOGICAL BOWEL NUTRITION STATUS      Incontinent Diet(heart healthy, please see DC summary)  AMBULATORY STATUS COMMUNICATION OF NEEDS Skin   Extensive Assist Verbally Normal                       Personal Care Assistance Level of Assistance  Bathing, Feeding, Dressing Bathing Assistance: Limited assistance Feeding assistance: Independent Dressing Assistance: Limited assistance     Functional Limitations Info  Sight, Hearing, Speech Sight Info: Adequate Hearing Info: Impaired Speech Info: Adequate    SPECIAL CARE FACTORS FREQUENCY  PT (By licensed PT), OT (By licensed OT)     PT Frequency: 5x/week OT Frequency: 5x/week            Contractures Contractures Info: Not present    Additional Factors Info  Code Status, Allergies Code Status Info: Full Allergies Info: Ciprofloxacin, Tape           Current Medications (06/18/2018):  This is the current hospital active medication list Current Facility-Administered Medications  Medication Dose Route Frequency Provider Last Rate Last Dose  . acetaminophen (TYLENOL) tablet 500 mg  500 mg Oral Q6H PRN Elwyn Reach, MD   500 mg at 06/17/18 2243  . acidophilus (RISAQUAD) capsule 1 capsule  1 capsule Oral Daily Elwyn Reach, MD   1 capsule at 06/18/18 0827  . albuterol (PROVENTIL) (2.5 MG/3ML) 0.083% nebulizer solution 2.5 mg  2.5 mg Inhalation TID Roxan Hockey, MD   2.5 mg at 06/18/18 0836  . azithromycin (ZITHROMAX) 500 mg in sodium chloride 0.9 %  250 mL IVPB  500 mg Intravenous Daily Emokpae, Courage, MD 250 mL/hr at 06/18/18 1014 500 mg at 06/18/18 1014  . benzonatate (TESSALON) capsule 200 mg  200 mg Oral BID PRN Lovey Newcomer T, NP   200 mg at 06/17/18 2255  . carvedilol (COREG) tablet 12.5 mg  12.5 mg Oral BID WC Gala Romney L, MD   12.5 mg at 06/18/18 1610  . ceFEPIme (MAXIPIME) 2 g in sodium chloride 0.9 % 100 mL IVPB   2 g Intravenous Q8H Romona Curls, RPH 200 mL/hr at 06/18/18 9604 2 g at 06/18/18 5409  . cholecalciferol (VITAMIN D) tablet 1,000 Units  1,000 Units Oral Daily Elwyn Reach, MD   1,000 Units at 06/18/18 8119  . ezetimibe (ZETIA) tablet 10 mg  10 mg Oral Daily Elwyn Reach, MD   10 mg at 06/18/18 0820  . feeding supplement (ENSURE ENLIVE) (ENSURE ENLIVE) liquid 237 mL  237 mL Oral BID BM Emokpae, Courage, MD   237 mL at 06/18/18 1016  . furosemide (LASIX) tablet 20 mg  20 mg Oral Q M,W,F Gala Romney L, MD   20 mg at 06/18/18 0827  . guaiFENesin (MUCINEX) 12 hr tablet 600 mg  600 mg Oral BID Emokpae, Courage, MD   600 mg at 06/18/18 1478  . heparin ADULT infusion 100 units/mL (25000 units/250mL sodium chloride 0.45%)  1,400 Units/hr Intravenous Continuous Romona Curls, RPH 14 mL/hr at 06/17/18 2235 1,400 Units/hr at 06/17/18 2235  . LORazepam (ATIVAN) tablet 0.5 mg  0.5 mg Oral Q6H PRN Elwyn Reach, MD   0.5 mg at 06/17/18 2249  . losartan (COZAAR) tablet 50 mg  50 mg Oral Daily Elwyn Reach, MD   50 mg at 06/18/18 0827  . multivitamin with minerals tablet 1 tablet  1 tablet Oral Daily Elwyn Reach, MD   1 tablet at 06/18/18 0820  . nitroGLYCERIN (NITROSTAT) SL tablet 0.4 mg  0.4 mg Sublingual Q5 min PRN Gala Romney L, MD      . pantoprazole (PROTONIX) EC tablet 40 mg  40 mg Oral Daily Elwyn Reach, MD   40 mg at 06/18/18 0827  . potassium chloride (K-DUR,KLOR-CON) CR tablet 10 mEq  10 mEq Oral Daily Elwyn Reach, MD   10 mEq at 06/18/18 0821  . pravastatin (PRAVACHOL) tablet 40 mg  40 mg Oral q1800 Elwyn Reach, MD   40 mg at 06/17/18 1714     Discharge Medications: Please see discharge summary for a list of discharge medications.  Relevant Imaging Results:  Relevant Lab Results:   Additional Information SSN: [id number]  Estanislado Emms, LCSW

## 2018-06-19 LAB — CBC
HCT: 35.3 % — ABNORMAL LOW (ref 39.0–52.0)
Hemoglobin: 11.7 g/dL — ABNORMAL LOW (ref 13.0–17.0)
MCH: 30.5 pg (ref 26.0–34.0)
MCHC: 33.1 g/dL (ref 30.0–36.0)
MCV: 91.9 fL (ref 80.0–100.0)
Platelets: 263 10*3/uL (ref 150–400)
RBC: 3.84 MIL/uL — ABNORMAL LOW (ref 4.22–5.81)
RDW: 13.1 % (ref 11.5–15.5)
WBC: 11 10*3/uL — ABNORMAL HIGH (ref 4.0–10.5)
nRBC: 0 % (ref 0.0–0.2)

## 2018-06-19 LAB — BASIC METABOLIC PANEL
ANION GAP: 7 (ref 5–15)
BUN: 14 mg/dL (ref 8–23)
CALCIUM: 8.7 mg/dL — AB (ref 8.9–10.3)
CO2: 23 mmol/L (ref 22–32)
Chloride: 107 mmol/L (ref 98–111)
Creatinine, Ser: 0.81 mg/dL (ref 0.61–1.24)
Glucose, Bld: 117 mg/dL — ABNORMAL HIGH (ref 70–99)
Potassium: 4.1 mmol/L (ref 3.5–5.1)
Sodium: 137 mmol/L (ref 135–145)

## 2018-06-19 LAB — HEPARIN LEVEL (UNFRACTIONATED): Heparin Unfractionated: 0.37 IU/mL (ref 0.30–0.70)

## 2018-06-19 MED ORDER — AMOXICILLIN-POT CLAVULANATE 875-125 MG PO TABS
1.0000 | ORAL_TABLET | Freq: Two times a day (BID) | ORAL | Status: DC
  Administered 2018-06-19 – 2018-06-20 (×2): 1 via ORAL
  Filled 2018-06-19 (×2): qty 1

## 2018-06-19 MED ORDER — POLYETHYLENE GLYCOL 3350 17 G PO PACK
17.0000 g | PACK | Freq: Every day | ORAL | Status: DC
  Administered 2018-06-19: 17 g via ORAL
  Filled 2018-06-19: qty 1

## 2018-06-19 MED ORDER — APIXABAN 5 MG PO TABS
10.0000 mg | ORAL_TABLET | ORAL | Status: AC
  Administered 2018-06-19: 10 mg via ORAL
  Filled 2018-06-19: qty 2

## 2018-06-19 MED ORDER — APIXABAN 5 MG PO TABS: 5.0000 mg | ORAL_TABLET | Freq: Two times a day (BID) | ORAL | Status: DC

## 2018-06-19 MED ORDER — APIXABAN 5 MG PO TABS
10.0000 mg | ORAL_TABLET | Freq: Two times a day (BID) | ORAL | Status: DC
  Administered 2018-06-19 – 2018-06-20 (×2): 10 mg via ORAL
  Filled 2018-06-19 (×2): qty 2

## 2018-06-19 NOTE — Care Management Note (Addendum)
Case Management Note  Patient Details  Name: Lee [name] MRN: [id number] Date of Birth: [birth year]  Subjective/Objective: Pt presented for Pulmonary Embolus. Benefits check in process for Eliquis. CSW- following for SNF- Awaiting on Authorization.                   Action/Plan: No further needs from CM at this time.   Expected Discharge Date:                  Expected Discharge Plan:  Skilled Nursing Facility  In-House Referral:  Clinical Social Work  Discharge planning Services  CM Consult  Post Acute Care Choice:  NA Choice offered to:  NA  DME Arranged:  N/A DME Agency:  NA  HH Arranged:  NIV HH Agency:  NA  Status of Service:  Completed, signed off  If discussed at [place] of Stay Meetings, dates discussed:    Additional Comments: 1607 06-20-18 Jacqlyn Krauss, RN,BSN  Plan for SNF- CSW assisting with disposition needs.    1603 06-19-18 Jacqlyn Krauss, RN,BSN [phone] Zero co pay for Eliquis- no prior authorization at this time.  Bethena Roys, RN 06/19/2018, 3:13 PM

## 2018-06-19 NOTE — Progress Notes (Signed)
Patient's wife visited [place] and has chosen [facility] for rehab for patient. CSW confirmed bed at [facility] and SNF will start patient's [facility] authorization today. Patient requires auth before admitting to the facility. CSW to follow.  Estanislado Emms, [place]

## 2018-06-19 NOTE — Progress Notes (Addendum)
ANTICOAGULATION CONSULT NOTE - Follow Up Consult  Pharmacy Consult for Heparin>>Eliquis Indication: pulmonary embolus  Allergies  Allergen Reactions  . Ciprofloxacin Other (See Comments)    Tingle feeling throughout body  . Tape Other (See Comments)    Redness, Please use "paper" tape    Patient Measurements: Height: 5\' 8"  (172.7 cm) Weight: 213 lb 10 oz (96.9 kg) IBW/kg (Calculated) : 68.4 Heparin Dosing Weight:   Vital Signs: Temp: 98.5 F (36.9 C) (10/29 0357) Temp Source: Oral (10/29 0357) BP: 177/82 (10/29 0357) Pulse Rate: 77 (10/29 0357)  Labs: Recent Labs    06/17/18 0446 06/18/18 0556 06/19/18 0343  HGB 11.7* 11.9* 11.7*  HCT 36.2* 35.8* 35.3*  PLT 230 245 263  HEPARINUNFRC 0.34 0.35 0.37  CREATININE  --   --  0.81    Estimated Creatinine Clearance: 75.3 mL/min (by C-G formula based on SCr of 0.81 mg/dL).  Assessment: Anticoag: hep for new PE. No ac pta. Cbc stable. HL 0.37 in goal. Transition to Eliquis today.  Goal of Therapy:  Heparin level 0.3-0.7 units/ml Monitor platelets by anticoagulation protocol: Yes   Plan:  D/c IV heparin Plan DOAC at discharge: may be crushed Eliquis 10mg  BID x 7d, then 5mg  BID  Rosaly Labarbera S. Alford Highland, PharmD, [facility] Clinical Staff Pharmacist Pager [phone]  Eilene Ghazi Stillinger 06/19/2018,8:56 AM

## 2018-06-19 NOTE — Progress Notes (Signed)
Patient Demographics:    Lee [name], is a 84 y.o. male, DOB - 19-Apr-1931, SFK:[id number]  Admit date - 06/15/2018   Admitting Physician Elwyn Reach, MD  Outpatient Primary MD for the patient is [name], MD  LOS - 4   Chief Complaint  Patient presents with  . Shortness of Breath        Subjective:    Lee [name] today has no fevers, no emesis,  No chest pain,  caregiver at bedside, oral intake is fair, cough persist, no BM  Assessment  & Plan :    Principal Problem:   Pulmonary emboli (HCC) Active Problems:   Hypercholesterolemia   GERD (gastroesophageal reflux disease)   IPF (idiopathic pulmonary fibrosis) (HCC)   Essential hypertension   Cardiomyopathy, nonischemic (HCC) - EF 40-45% by echo, but nearly 50% by Myoview. Anterior akinesis seen on echo not seen on Myoview.   Aspiration pneumonia ([place])   Altered mental status  Brief Summary:- 84 y.o. male with medical history significant of idiopathic pulmonary fibrosis, pulmonary hypertension, nonischemic cardiomyopathy, hyperlipidemia, previous CVA and chronic hypoxic respiratory failure admitted on 06/15/2018 with fevers, cough worsening hypoxia and dyspnea and findings consistent with right-sided subsegmental and segmental PE as well as pneumonia  Plan:- 1) right-sided PE--- dyspnea and congestion improving very slowly, CTA chest with mostly segmental and subsegmental lobe of the right lung, treated with IV heparin,  transition to Eliquis on 06/19/2018, risk versus benefit of anticoagulation discussed with patient and his family, questions answered, repeat echocardiogram from 06/18/2018 with preserved EF  2) right-sided pneumonia----no further fevers, white count is down to 11, cough and congestion appears to be improving  slowly,  stopped vancomycin on 06/16/18, MRSA nasal screen neg, treated with cefepime (started 06/15/18, also  treated with azithromycin started on 06/17/2018, okay to stop cefepime and azithromycin on 06/19/2018 start Augmentin on 06/19/2018 to cover for possible aspiration pneumonia, continue mucolytics and bronchodilators, continue treatment oxygen  3) acute on chronic hypoxic respiratory failure----at baseline patient uses 2 L of oxygen via nasal cannula due to advanced emphysema/COPD and IPF, initially in the ED patient required BiPAP, now back to nasal cannula.  Worsening oxygen requirement was secondary to #1 #2 above  4)H/o HTN/ H/o HFrEF --patient with history of nonischemic cardiac myopathy with previous EF around 45%, no evidence of acute CHF exacerbation at this time,  continue Coreg 12.5 mg twice daily, continue losartan 50 mg daily, continue Lasix 20 mg on Mondays Wednesdays and Fridays per home regimen, repeat echocardiogram from 06/18/2018 with preserved EF   5)History of prior stroke--- stable, no NEW acute neuro deficits at this time, PTA patient was on Plavix and pravastatin/zetia, may hold Plavix in view of full anticoagulation with heparin/eliquis   6)FEN/Dysphagia --- concerns about dysphagia and aspiration risk, patient does better when medications are crushed and given in applesauce, patient's wife declines swallow eval as neither  the patient nor his wife would like to have the diet modified, patient apparently had a speech and swallow evaluation earlier in 2019 without significant findings at that time  Disposition/Need for in-Hospital Stay- patient unable to be discharged at this time due to acute right-sided pulmonary embolism and pneumonia ... Awaiting insurance approval for transfer to SNF  Code Status : full  Disposition Plan  :  AWaiting SNF  Consults  :  PT  DVT Prophylaxis  : Eliquis  Lab Results  Component Value Date   PLT 263 06/19/2018    Inpatient Medications  Scheduled Meds: . acidophilus  1 capsule Oral Daily  . albuterol  2.5 mg Inhalation TID  .  amoxicillin-clavulanate  1 tablet Oral BID  . carvedilol  12.5 mg Oral BID WC  . cholecalciferol  1,000 Units Oral Daily  . ezetimibe  10 mg Oral Daily  . feeding supplement (ENSURE ENLIVE)  237 mL Oral BID BM  . furosemide  20 mg Oral Q M,W,F  . guaiFENesin  300 mg Oral Q6H  . losartan  50 mg Oral Daily  . multivitamin with minerals  1 tablet Oral Daily  . pantoprazole  40 mg Oral Daily  . polyethylene glycol  17 g Oral Daily  . potassium chloride  10 mEq Oral Daily  . pravastatin  40 mg Oral q1800   Continuous Infusions: . sodium chloride 10 mL/hr at 06/19/18 0606  . heparin 1,400 Units/hr (06/19/18 0556)   PRN Meds:.sodium chloride, acetaminophen, benzonatate, LORazepam, nitroGLYCERIN    Anti-infectives (From admission, onward)   Start     Dose/Rate Route Frequency Ordered Stop   06/19/18 2200  amoxicillin-clavulanate (AUGMENTIN) 875-125 MG per tablet 1 tablet     1 tablet Oral 2 times daily 06/19/18 1319     06/17/18 1230  azithromycin (ZITHROMAX) 500 mg in sodium chloride 0.9 % 250 mL IVPB  Status:  Discontinued     500 mg 250 mL/hr over 60 Minutes Intravenous Daily 06/17/18 1150 06/19/18 1319   06/16/18 0300  vancomycin (VANCOCIN) IVPB 750 mg/150 ml premix  Status:  Discontinued     750 mg 150 mL/hr over 60 Minutes Intravenous Every 12 hours 06/15/18 1714 06/16/18 1357   06/15/18 2200  ceFEPIme (MAXIPIME) 1 g in sodium chloride 0.9 % 100 mL IVPB  Status:  Discontinued     1 g 200 mL/hr over 30 Minutes Intravenous Every 8 hours 06/15/18 2034 06/15/18 2117   06/15/18 2130  ceFEPIme (MAXIPIME) 2 g in sodium chloride 0.9 % 100 mL IVPB  Status:  Discontinued     2 g 200 mL/hr over 30 Minutes Intravenous Every 8 hours 06/15/18 1714 06/19/18 1323   06/15/18 1815  cefTRIAXone (ROCEPHIN) 1 g in sodium chloride 0.9 % 100 mL IVPB     1 g 200 mL/hr over 30 Minutes Intravenous  Once 06/15/18 1813 06/15/18 1931   06/15/18 1345  vancomycin (VANCOCIN) 2,000 mg in sodium chloride 0.9 %  500 mL IVPB     2,000 mg 250 mL/hr over 120 Minutes Intravenous  Once 06/15/18 1331 06/15/18 1639   06/15/18 1330  vancomycin (VANCOCIN) IVPB 1000 mg/200 mL premix  Status:  Discontinued     1,000 mg 200 mL/hr over 60 Minutes Intravenous  Once 06/15/18 1329 06/15/18 1331   06/15/18 1330  ceFEPIme (MAXIPIME) 2 g in sodium chloride 0.9 % 100 mL IVPB     2 g 200 mL/hr over 30 Minutes Intravenous  Once 06/15/18 1329 06/15/18 1419        Objective:   Vitals:   06/19/18 0927 06/19/18 0930 06/19/18 0933 06/19/18 1315  BP: (!) 117/51 (!) 117/51    Pulse:   81   Resp:   (!) 31   Temp:      TempSrc:      SpO2:  96% 93%  97%  Weight:      Height:        Wt Readings from Last 3 Encounters:  06/19/18 96.9 kg  05/07/18 94.2 kg  02/26/18 92.7 kg     Intake/Output Summary (Last 24 hours) at 06/19/2018 1331 Last data filed at 06/19/2018 1053 Gross per 24 hour  Intake 372.2 ml  Output -  Net 372.2 ml     Physical Exam Patient is examined daily including today on 06/19/18 , exams remain the same as of yesterday except that has changed   Gen:- Awake Alert, chronically ill-appearing HEENT:- [place].AT, No sclera icterus Ears- HOH Nose- [place] 2L/min Neck-Supple Neck,No JVD,.  Lungs-    diminished in bases with few scattered rhonchi, no wheezing CV- S1, S2 normal, regular Abd-  +ve B.Sounds, Abd Soft, No tenderness,    Extremity/Skin:- No  edema,   good pulses Psych-affect is appropriate, baseline cognitive deficits  Neuro-generalized weakness without  new focal deficits, no tremors   Data Review:   Micro Results Recent Results (from the past 240 hour(s))  Blood Culture (routine x 2)     Status: None (Preliminary result)   Collection Time: 06/15/18  1:36 PM  Result Value Ref Range Status   Specimen Description BLOOD RIGHT ANTECUBITAL  Final   Special Requests   Final    BOTTLES DRAWN AEROBIC AND ANAEROBIC Blood Culture adequate volume   Culture   Final    NO GROWTH 4  DAYS Performed at [facility], [place] [address], [place], [place] [address]    Report Status PENDING  Incomplete  Blood Culture (routine x 2)     Status: None (Preliminary result)   Collection Time: 06/15/18  1:38 PM  Result Value Ref Range Status   Specimen Description BLOOD LEFT HAND  Final   Special Requests   Final    BOTTLES DRAWN AEROBIC ONLY Blood Culture results may not be optimal due to an excessive volume of blood received in culture bottles   Culture   Final    NO GROWTH 4 DAYS Performed at [facility], [place] [address] [place], [place] [address]    Report Status PENDING  Incomplete  MRSA PCR Screening     Status: None   Collection Time: 06/16/18  7:02 AM  Result Value Ref Range Status   MRSA by PCR NEGATIVE NEGATIVE Final    Comment:        The GeneXpert MRSA Assay (FDA approved for NASAL specimens only), is one component of a comprehensive MRSA colonization surveillance program. It is not intended to diagnose MRSA infection nor to guide or monitor treatment for MRSA infections. Performed at [facility], [place] [address], [place], [place] [address]     Radiology Reports Ct Angio Chest Pe W And/or Wo Contrast  Result Date: 06/15/2018 CLINICAL DATA:  Cough, chest pain EXAM: CT ANGIOGRAPHY CHEST WITH CONTRAST TECHNIQUE: Multidetector CT imaging of the chest was performed using the standard protocol during bolus administration of intravenous contrast. Multiplanar CT image reconstructions and MIPs were obtained to evaluate the vascular anatomy. CONTRAST:  1104mL ISOVUE-370 IOPAMIDOL (ISOVUE-370) INJECTION 76% COMPARISON:  CXR 06/15/2018 FINDINGS: Cardiovascular: Satisfactory opacification of the pulmonary arteries to the segmental level. Small segmental and subsegmental right lower lobe pulmonary emboli. Moderate aortic atherosclerosis. No aneurysm. Coronary vascular calcification. Borderline cardiomegaly. No pericardial effusion Mediastinum/Nodes: Midline  trachea. No thyroid mass. Esophagus within normal limits. No significant adenopathy. Moderate hiatal hernia Lungs/Pleura: Moderate severe emphysema. Ground-glass density  and consolidation in the posterior right upper lobe consistent with a pneumonia. Upper Abdomen: No acute abnormality. Musculoskeletal: No acute or suspicious abnormality. Degenerative change Review of the MIP images confirms the above findings. IMPRESSION: 1. Small segmental and subsegmental right lower lobe pulmonary emboli. 2. Moderate to severe emphysema. Consolidation and ground-glass density in the posterior right upper lobe consistent with a pneumonia Critical Value/emergent results were called by telephone at the time of interpretation on 06/15/2018 at 6:12 pm to [facility] in the ED , who verbally acknowledged these results. Aortic Atherosclerosis (ICD10-I70.0) and Emphysema (ICD10-J43.9). Electronically Signed   By: Donavan Foil M.D.   On: 06/15/2018 18:13   Dg Chest Port 1 View  Result Date: 06/15/2018 CLINICAL DATA:  Respiratory distress. EXAM: PORTABLE CHEST 1 VIEW COMPARISON:  05/07/2018 and 07/28/2017 FINDINGS: Heart size and pulmonary vascularity are within normal limits. Lungs are clear except for slight scarring at the left lung base. No effusions. No acute bone abnormality. IMPRESSION: No acute abnormality. Aortic Atherosclerosis (ICD10-I70.0). Electronically Signed   By: Lorriane Shire M.D.   On: 06/15/2018 13:18     CBC Recent Labs  Lab 06/15/18 1329 06/16/18 0347 06/17/18 0446 06/18/18 0556 06/19/18 0343  WBC 16.6* 16.3* 13.8* 11.9* 11.0*  HGB 13.5 11.6* 11.7* 11.9* 11.7*  HCT 41.5 34.3* 36.2* 35.8* 35.3*  PLT 279 222 230 245 263  MCV 94.7 92.7 93.5 92.5 91.9  MCH 30.8 31.4 30.2 30.7 30.5  MCHC 32.5 33.8 32.3 33.2 33.1  RDW 13.2 13.3 13.2 13.2 13.1  LYMPHSABS 1.1  --   --   --   --   MONOABS 1.0  --   --   --   --   EOSABS 0.1  --   --   --   --   BASOSABS 0.0  --   --   --   --     Chemistries    Recent Labs  Lab 06/15/18 1329 06/16/18 0347 06/19/18 0343  NA 138 138 137  K 3.9 3.2* 4.1  CL 106 108 107  CO2 25 23 23   GLUCOSE 122* 132* 117*  BUN 10 13 14   CREATININE 1.05 1.00 0.81  CALCIUM 8.8* 8.2* 8.7*  AST 23 22  --   ALT 19 16  --   ALKPHOS 62 54  --   BILITOT 0.9 0.9  --    ------------------------------------------------------------------------------------------------------------------ No results for input(s): CHOL, HDL, LDLCALC, TRIG, CHOLHDL, LDLDIRECT in the last 72 hours.  No results found for: HGBA1C ------------------------------------------------------------------------------------------------------------------ No results for input(s): TSH, T4TOTAL, T3FREE, THYROIDAB in the last 72 hours.  Invalid input(s): FREET3 ------------------------------------------------------------------------------------------------------------------ No results for input(s): VITAMINB12, FOLATE, FERRITIN, TIBC, IRON, RETICCTPCT in the last 72 hours.  Coagulation profile No results for input(s): INR, PROTIME in the last 168 hours.  No results for input(s): DDIMER in the last 72 hours.  Cardiac Enzymes No results for input(s): CKMB, TROPONINI, MYOGLOBIN in the last 168 hours.  Invalid input(s): CK ------------------------------------------------------------------------------------------------------------------    Component Value Date/Time   BNP 141.4 (H) 06/15/2018 1329     Nivin Braniff M.D on 06/19/2018 at 1:31 PM  Pager---[phone] Go to [email or web address] - password [email or web address] for contact info  [facility] - Office  [phone]

## 2018-06-20 DIAGNOSIS — J84112 Idiopathic pulmonary fibrosis: Secondary | ICD-10-CM

## 2018-06-20 LAB — CULTURE, BLOOD (ROUTINE X 2)
CULTURE: NO GROWTH
Culture: NO GROWTH
SPECIAL REQUESTS: ADEQUATE

## 2018-06-20 MED ORDER — IPRATROPIUM-ALBUTEROL 0.5-2.5 (3) MG/3ML IN SOLN
3.0000 mL | Freq: Four times a day (QID) | RESPIRATORY_TRACT | Status: DC
  Administered 2018-06-20: 3 mL via RESPIRATORY_TRACT
  Filled 2018-06-20: qty 3

## 2018-06-20 MED ORDER — APIXABAN 5 MG PO TABS: 10.0000 mg | ORAL_TABLET | Freq: Two times a day (BID) | ORAL | 0 refills | Status: DC

## 2018-06-20 MED ORDER — IPRATROPIUM-ALBUTEROL 0.5-2.5 (3) MG/3ML IN SOLN: 3.0000 mL | Freq: Four times a day (QID) | RESPIRATORY_TRACT | 0 refills | Status: DC

## 2018-06-20 MED ORDER — SODIUM CHLORIDE 3 % IN NEBU
4.0000 mL | INHALATION_SOLUTION | Freq: Three times a day (TID) | RESPIRATORY_TRACT | Status: DC
  Administered 2018-06-20: 4 mL via RESPIRATORY_TRACT
  Filled 2018-06-20 (×2): qty 4

## 2018-06-20 MED ORDER — SODIUM CHLORIDE 3 % IN NEBU
4.0000 mL | INHALATION_SOLUTION | Freq: Three times a day (TID) | RESPIRATORY_TRACT | Status: DC
  Filled 2018-06-20: qty 4

## 2018-06-20 MED ORDER — GUAIFENESIN ER 600 MG PO TB12
1200.0000 mg | ORAL_TABLET | Freq: Two times a day (BID) | ORAL | Status: DC
  Administered 2018-06-20: 1200 mg via ORAL
  Filled 2018-06-20: qty 2

## 2018-06-20 MED ORDER — GUAIFENESIN ER 600 MG PO TB12: 1200.0000 mg | ORAL_TABLET | Freq: Two times a day (BID) | ORAL | 0 refills | Status: DC

## 2018-06-20 MED ORDER — BENZONATATE 200 MG PO CAPS: 200.0000 mg | ORAL_CAPSULE | Freq: Two times a day (BID) | ORAL | 0 refills | Status: DC | PRN

## 2018-06-20 MED ORDER — APIXABAN 5 MG PO TABS: 5.0000 mg | ORAL_TABLET | Freq: Two times a day (BID) | ORAL | 0 refills | Status: DC

## 2018-06-20 MED ORDER — AMOXICILLIN-POT CLAVULANATE 875-125 MG PO TABS: 1.0000 | ORAL_TABLET | Freq: Two times a day (BID) | ORAL | 0 refills | Status: AC

## 2018-06-20 MED ORDER — ENSURE ENLIVE PO LIQD: 237.0000 mL | Freq: Two times a day (BID) | ORAL | 12 refills | Status: DC

## 2018-06-20 NOTE — Progress Notes (Signed)
SATURATION QUALIFICATIONS: (This note is used to comply with regulatory documentation for home oxygen)  Patient Saturations on Room Air at Rest = 97%  Patient Saturations on Room Air while Ambulating = 92%  Patient Saturations on NA Liters of oxygen while Ambulating = NA%  Please briefly explain why patient needs home oxygen: The patient did not require oxygen during ambulation. Of note the wife states, " he wears two liters of oxygen at home at night. "

## 2018-06-20 NOTE — Progress Notes (Signed)
Patient will discharge to [facility]. Anticipated discharge date: 06/20/18 Family notified: Channin Agustin, spouse Transportation by: [facility]  Nurse to call report to [phone].  CSW signing off.  Estanislado Emms, [place]  Clinical Social Worker

## 2018-06-20 NOTE — Discharge Summary (Signed)
Discharge Summary  Lee [name]:[id number] DOB: 07-Apr-1931  PCP: [name], MD  Admit date: 06/15/2018 Discharge date: 06/20/2018  Time spent: 35 minutes  Recommendations for Outpatient Follow-up:  1. Follow-up with your PCP 2. Follow-up with your cardiologist 3. Follow-up with your pulmonologist 4. Continue physical therapy at SNF 5. Fall precautions 6. Take your medications as prescribed  Discharge Diagnoses:  Active Hospital Problems   Diagnosis Date Noted  . Pulmonary emboli ([place]) 06/15/2018  . Aspiration pneumonia ([place]) 06/15/2018  . Altered mental status 06/15/2018  . Cardiomyopathy, nonischemic (HCC) - EF 40-45% by echo, but nearly 50% by Myoview. Anterior akinesis seen on echo not seen on Myoview. 05/02/2015  . Essential hypertension 04/30/2015  . IPF (idiopathic pulmonary fibrosis) ([place]) 10/22/2014  . GERD (gastroesophageal reflux disease) 02/05/2013  . Hypercholesterolemia 08/03/2012    Resolved Hospital Problems  No resolved problems to display.    Discharge Condition: Stable  Diet recommendation: Resume previous diet  Vitals:   06/20/18 0914 06/20/18 1129  BP:  134/65  Pulse:  73  Resp:    Temp:    SpO2: 96%     History of present illness:  84 y.o.malewith medical history significant ofidiopathic pulmonaryfibrosis,pulmonary hypertension, nonischemic cardiomyopathy, hyperlipidemia,previousCVA and chronic hypoxic respiratory failure admitted on 06/15/2018 with fevers, cough worsening hypoxia and dyspnea and findings consistent with right-sided subsegmental and segmental PE as well as pneumonia.  06/20/2018: Patient seen and examined with his wife and sitter at bedside.  Alert but minimally interactive.  Has no new complaints.  Wheezing on exam.  Will start breathing treatments around-the-clock.  Appears weak.  PT recommended SNF.   On the day of discharge, the patient was hemodynamically stable.  He will need to follow-up with his  primary care provider, continue physical therapy at SNF.    Hospital Course:  Principal Problem:   Pulmonary emboli (HCC) Active Problems:   Hypercholesterolemia   GERD (gastroesophageal reflux disease)   IPF (idiopathic pulmonary fibrosis) (HCC)   Essential hypertension   Cardiomyopathy, nonischemic (HCC) - EF 40-45% by echo, but nearly 50% by Myoview. Anterior akinesis seen on echo not seen on Myoview.   Aspiration pneumonia (HCC)   Altered mental status  Right-sided PE No right heart strain Continue Eliquis O2 supplementation to maintain O2 saturation greater than 92%  Right-sided pneumonia Continue Augmentin twice daily Start DuoNeb every 6 hours and every 2 hours as needed Start pulmonary toilet hypersaline nebs and Mucinex 1200 mg twice daily  Hypertension Blood pressures well controlled Continue home antihypertensive medications  Chronic diastolic CHF Continue cardiac medications  Hyperlipidemia Continue Pravachol  Ambulatory dysfunction/physical debility Continue PT Fall precautions CSW working on SNF placement  Moderate to severe emphysema Independently reviewed CT chest done during this admission which revealed groundglass density continue COPD medications Started DuoNeb every 6 hours Continue Mucinex 1200 mg twice daily Follow-up with your pulmonologist outpatient    Code Status: Full code  Family Communication: Wife at bedside.  All questions answered to her satisfaction.  Disposition Plan: SNF pending insurance authorization   Consultants:  None  Procedures:  None  Antimicrobials:  Augmentin  DVT prophylaxis: Eliquis    Discharge Exam: BP 134/65   Pulse 73   Temp 97.7 F (36.5 C) (Oral)   Resp (!) 26   Ht 5\' 8"  (1.727 m)   Wt 96.7 kg   SpO2 96%   BMI 32.41 kg/m  . General: 84 y.o. year-old male well developed well nourished in no acute distress.  Alert and minimally interactive. . Cardiovascular: Regular  rate and rhythm with no rubs or gallops.  No thyromegaly or JVD noted.   [name] Respiratory: Mild rales and mild wheezes bilaterally. Good inspiratory effort. . Abdomen: Soft nontender nondistended with normal bowel sounds x4 quadrants. . Musculoskeletal: Trace lower extremity edema. 2/4 pulses in all 4 extremities. [name] Psychiatry: Mood is appropriate for condition and setting  Discharge Instructions You were cared for by a hospitalist during your hospital stay. If you have any questions about your discharge medications or the care you received while you were in the hospital after you are discharged, you can call the unit and asked to speak with the hospitalist on call if the hospitalist that took care of you is not available. Once you are discharged, your primary care physician will handle any further medical issues. Please note that NO REFILLS for any discharge medications will be authorized once you are discharged, as it is imperative that you return to your primary care physician (or establish a relationship with a primary care physician if you do not have one) for your aftercare needs so that they can reassess your need for medications and monitor your lab values.  Discharge Instructions    DME Nebulizer machine   Complete by:  As directed    Patient needs a nebulizer to treat with the following condition:  COPD (chronic obstructive pulmonary disease) ([place])     Allergies as of 06/20/2018      Reactions   Ciprofloxacin Other (See Comments)   Tingle feeling throughout body   Tape Other (See Comments)   Redness, Please use "paper" tape      Medication List    TAKE these medications   albuterol 108 (90 Base) MCG/ACT inhaler Commonly known as:  PROVENTIL HFA;VENTOLIN HFA Inhale 2 puffs into the lungs every 6 (six) hours as needed for wheezing or shortness of breath.   amoxicillin-clavulanate 875-125 MG tablet Commonly known as:  AUGMENTIN Take 1 tablet by mouth 2 (two) times daily for 5  days.   apixaban 5 MG Tabs tablet Commonly known as:  ELIQUIS Take 2 tablets (10 mg total) by mouth 2 (two) times daily.   apixaban 5 MG Tabs tablet Commonly known as:  ELIQUIS Take 1 tablet (5 mg total) by mouth 2 (two) times daily. Start taking on:  06/26/2018   benzonatate 200 MG capsule Commonly known as:  TESSALON Take 1 capsule (200 mg total) by mouth 2 (two) times daily as needed for cough.   carvedilol 12.5 MG tablet Commonly known as:  COREG TAKE 1 TABLET (12.5 MG TOTAL) BY MOUTH 2 (TWO) TIMES DAILY WITH A MEAL. What changed:  See the new instructions.   clopidogrel 75 MG tablet Commonly known as:  PLAVIX Take 75 mg by mouth daily.   ezetimibe 10 MG tablet Commonly known as:  ZETIA Take 10 mg by mouth daily.   feeding supplement (ENSURE ENLIVE) Liqd Take 237 mLs by mouth 2 (two) times daily between meals.   furosemide 20 MG tablet Commonly known as:  LASIX Take 20 mg by mouth every Monday, Wednesday, and Friday.   guaiFENesin 600 MG 12 hr tablet Commonly known as:  MUCINEX Take 2 tablets (1,200 mg total) by mouth 2 (two) times daily.   ipratropium-albuterol 0.5-2.5 (3) MG/3ML Soln Commonly known as:  DUONEB Take 3 mLs by nebulization every 6 (six) hours.   LIVALO 2 MG Tabs Generic drug:  Pitavastatin Calcium Take 2 mg by mouth See admin  instructions. Every 3rd day   losartan 50 MG tablet Commonly known as:  COZAAR Take 50 mg by mouth daily.   multivitamin with minerals Tabs tablet Take 1 tablet by mouth daily.   nitroGLYCERIN 0.4 MG SL tablet Commonly known as:  NITROSTAT Place 1 tablet (0.4 mg total) under the tongue every 5 (five) minutes as needed for chest pain.   pantoprazole 40 MG tablet Commonly known as:  PROTONIX Take 40 mg by mouth daily.   PHILLIPS COLON HEALTH Caps Take 1 capsule by mouth daily.   potassium chloride 10 MEQ CR capsule Commonly known as:  MICRO-K Take 10 mEq by mouth every Monday, Wednesday, and Friday. **Take with  lasix**   Tiotropium Bromide-Olodaterol 2.5-2.5 MCG/ACT Aers Inhale 2 puffs into the lungs daily.   TYLENOL PO Take 600 mg by mouth as needed (headache).   VITAMIN D PO Take 1 tablet by mouth daily.            Durable Medical Equipment  (From admission, onward)         Start     Ordered   06/20/18 0000  DME Nebulizer machine    Question:  Patient needs a nebulizer to treat with the following condition  Answer:  COPD (chronic obstructive pulmonary disease) ([place])   06/20/18 1440         Allergies  Allergen Reactions  . Ciprofloxacin Other (See Comments)    Tingle feeling throughout body  . Tape Other (See Comments)    Redness, Please use "paper" tape    Contact information for follow-up providers    [name], MD. Call in 1 day(s).   Specialty:  Internal Medicine Why:  Please call for post hospital follow-up appointment. Contact information: [address] [place] [address] [phone]            Contact information for after-discharge care    Destination    [facility] Preferred SNF .   Service:  Skilled Nursing Contact information: [address] [place] [phone]                   The results of significant diagnostics from this hospitalization (including imaging, microbiology, ancillary and laboratory) are listed below for reference.    Significant Diagnostic Studies: Ct Angio Chest Pe W And/or Wo Contrast  Result Date: 06/15/2018 CLINICAL DATA:  Cough, chest pain EXAM: CT ANGIOGRAPHY CHEST WITH CONTRAST TECHNIQUE: Multidetector CT imaging of the chest was performed using the standard protocol during bolus administration of intravenous contrast. Multiplanar CT image reconstructions and MIPs were obtained to evaluate the vascular anatomy. CONTRAST:  135mL ISOVUE-370 IOPAMIDOL (ISOVUE-370) INJECTION 76% COMPARISON:  CXR 06/15/2018 FINDINGS: Cardiovascular: Satisfactory opacification of  the pulmonary arteries to the segmental level. Small segmental and subsegmental right lower lobe pulmonary emboli. Moderate aortic atherosclerosis. No aneurysm. Coronary vascular calcification. Borderline cardiomegaly. No pericardial effusion Mediastinum/Nodes: Midline trachea. No thyroid mass. Esophagus within normal limits. No significant adenopathy. Moderate hiatal hernia Lungs/Pleura: Moderate severe emphysema. Ground-glass density and consolidation in the posterior right upper lobe consistent with a pneumonia. Upper Abdomen: No acute abnormality. Musculoskeletal: No acute or suspicious abnormality. Degenerative change Review of the MIP images confirms the above findings. IMPRESSION: 1. Small segmental and subsegmental right lower lobe pulmonary emboli. 2. Moderate to severe emphysema. Consolidation and ground-glass density in the posterior right upper lobe consistent with a pneumonia Critical Value/emergent results were called by telephone at the time of interpretation on 06/15/2018 at  6:12 pm to [facility] in the ED , who verbally acknowledged these results. Aortic Atherosclerosis (ICD10-I70.0) and Emphysema (ICD10-J43.9). Electronically Signed   By: Donavan Foil M.D.   On: 06/15/2018 18:13   Dg Chest Port 1 View  Result Date: 06/15/2018 CLINICAL DATA:  Respiratory distress. EXAM: PORTABLE CHEST 1 VIEW COMPARISON:  05/07/2018 and 07/28/2017 FINDINGS: Heart size and pulmonary vascularity are within normal limits. Lungs are clear except for slight scarring at the left lung base. No effusions. No acute bone abnormality. IMPRESSION: No acute abnormality. Aortic Atherosclerosis (ICD10-I70.0). Electronically Signed   By: Lorriane Shire M.D.   On: 06/15/2018 13:18    Microbiology: Recent Results (from the past 240 hour(s))  Blood Culture (routine x 2)     Status: None   Collection Time: 06/15/18  1:36 PM  Result Value Ref Range Status   Specimen Description BLOOD RIGHT ANTECUBITAL  Final   Special Requests    Final    BOTTLES DRAWN AEROBIC AND ANAEROBIC Blood Culture adequate volume   Culture   Final    NO GROWTH 5 DAYS Performed at [facility], [address], [place], [place] [address]    Report Status 06/20/2018 FINAL  Final  Blood Culture (routine x 2)     Status: None   Collection Time: 06/15/18  1:38 PM  Result Value Ref Range Status   Specimen Description BLOOD LEFT HAND  Final   Special Requests   Final    BOTTLES DRAWN AEROBIC ONLY Blood Culture results may not be optimal due to an excessive volume of blood received in culture bottles   Culture   Final    NO GROWTH 5 DAYS Performed at [facility], [place] [address] [place], [place] [address]    Report Status 06/20/2018 FINAL  Final  MRSA PCR Screening     Status: None   Collection Time: 06/16/18  7:02 AM  Result Value Ref Range Status   MRSA by PCR NEGATIVE NEGATIVE Final    Comment:        The GeneXpert MRSA Assay (FDA approved for NASAL specimens only), is one component of a comprehensive MRSA colonization surveillance program. It is not intended to diagnose MRSA infection nor to guide or monitor treatment for MRSA infections. Performed at [facility], [place] [address], [place], [place] [address]      Labs: Basic Metabolic Panel: Recent Labs  Lab 06/15/18 1329 06/16/18 0347 06/19/18 0343  NA 138 138 137  K 3.9 3.2* 4.1  CL 106 108 107  CO2 25 23 23   GLUCOSE 122* 132* 117*  BUN 10 13 14   CREATININE 1.05 1.00 0.81  CALCIUM 8.8* 8.2* 8.7*   Liver Function Tests: Recent Labs  Lab 06/15/18 1329 06/16/18 0347  AST 23 22  ALT 19 16  ALKPHOS 62 54  BILITOT 0.9 0.9  PROT 6.4* 5.1*  ALBUMIN 3.5 2.6*   No results for input(s): LIPASE, AMYLASE in the last 168 hours. No results for input(s): AMMONIA in the last 168 hours. CBC: Recent Labs  Lab 06/15/18 1329 06/16/18 0347 06/17/18 0446 06/18/18 0556 06/19/18 0343  WBC 16.6* 16.3* 13.8* 11.9* 11.0*  NEUTROABS 14.3*  --   --   --    --   HGB 13.5 11.6* 11.7* 11.9* 11.7*  HCT 41.5 34.3* 36.2* 35.8* 35.3*  MCV 94.7 92.7 93.5 92.5 91.9  PLT 279 222 230 245 263   Cardiac Enzymes: No results for input(s): CKTOTAL, CKMB, CKMBINDEX,  TROPONINI in the last 168 hours. BNP: BNP (last 3 results) Recent Labs    06/15/18 1329  BNP 141.4*    ProBNP (last 3 results) No results for input(s): PROBNP in the last 8760 hours.  CBG: No results for input(s): GLUCAP in the last 168 hours.     Signed:  Kayleen Memos, MD [facility] 06/20/2018, 2:43 PM

## 2018-06-20 NOTE — Progress Notes (Signed)
Physical Therapy Treatment Patient Details Name: Lee [name] MRN: [id number] DOB: 1930-10-18 Today's Date: 06/20/2018    History of Present Illness 84 y.o. M with significant of idiopathic pulmonary fibrosis, pulmonary hypertension, nonischemic cardiomyopathy, hyperlipidemia, previous CVA and chronic hypoxic respiratory failure admitted on 10/25 with fevers, cough, worsening hypoxia and dyspnea and findings consistent with right sided subsegmental and segmental PE as well as pneumonia.    PT Comments    Pt admitted with above diagnosis. Pt currently with functional limitations due to balance and endurance deficits. Pt was able to pivot to 3N1 with mod assist of 2 with RW with max cues for technique.  Pt pivoted to recliner as well with RW with mod assist of 2 and fatigue continues.   Pt will benefit from skilled PT to increase their independence and safety with mobility to allow discharge to the venue listed below.     Follow Up Recommendations  SNF;Supervision/Assistance - 24 hour     Equipment Recommendations  None recommended by PT    Recommendations for Other Services OT consult     Precautions / Restrictions Precautions Precautions: Fall Restrictions Weight Bearing Restrictions: No    Mobility  Bed Mobility Overal bed mobility: Needs Assistance Bed Mobility: Supine to Sit     Supine to sit: Mod assist;HOB elevated     General bed mobility comments: Cues for technique, sequencing- assist to elevate trunk and scoot to EOB. 2/4 DOE.   Transfers Overall transfer level: Needs assistance Equipment used: Rolling walker (2 wheeled) Transfers: Sit to/from [employer] Transfers Sit to Stand: Mod assist;From elevated surface Stand pivot transfers: Mod assist;+2 physical assistance Squat pivot transfers: Mod assist;+2 physical assistance     General transfer comment: Assist to stand from EOB with mod assist to power up.  Pt maintains flexed  trunk, hips and knees.  Pt states he needs to use to have BM therefore obtain 3N1 and squat pivot to 3N1 with mod assist of 2 with max cues for technique.  Pt with posterior bias and LEs leaning on bed rail/chair. Transferred to chair after pt used 3N1 with need for mod assist and cues to stand pivot and turn needing assist with proximity to RW with pt pushing it out and not stepping with incr flexion and ultimately had to move RW for pt and assist him to step around to chair with pt sitting too soon and needing assist to guide hips into chair.   Pt needs mod cues for hand placement throughout as he places hands on front of RW and not on handrests.  He also needed cues to place hands on bed and 3N1 when trying to stand.  Poor following of commands overall with at least 5 second delay.  Cleaned pt in standing and pt did stand for at least 30 seconds and then 1 1/2 min therefore too fatigued to walk.    Ambulation/Gait                 Stairs             Wheelchair Mobility    Modified Rankin (Stroke Patients Only)       Balance Overall balance assessment: Needs assistance Sitting-balance support: No upper extremity supported;Feet supported Sitting balance-Leahy Scale: Fair Sitting balance - Comments: Initially Min A progressing to min guard.    Standing balance support: During functional activity Standing balance-Leahy Scale: Poor Standing balance comment: reliant on external support and RW.  Cognition Arousal/Alertness: Awake/alert Behavior During Therapy: WFL for tasks assessed/performed Overall Cognitive Status: History of cognitive impairments - at baseline Area of Impairment: Orientation                 Orientation Level: Disoriented to;Person;Place;Time;Situation             General Comments: Caregiver present in room and states pt at cognitive baseline. Follows simple 1 step commands consistently with repetition and  increased time. HOH.      Exercises General Exercises - Lower Extremity Ankle Circles/Pumps: AROM;Both;10 reps;Seated Long Arc Quad: AROM;Both;10 reps;Seated    General Comments General comments (skin integrity, edema, etc.): Pt incontinent of urine and today bowels as well.       Pertinent Vitals/Pain Pain Assessment: No/denies pain    Home Living                      Prior Function            PT Goals (current goals can now be found in the care plan section) Acute Rehab PT Goals Patient Stated Goal: none stated by pt; pt daughter interested in SNF Progress towards PT goals: Progressing toward goals    Frequency    Min 3X/week      PT Plan Current plan remains appropriate    Co-evaluation              AM-PAC PT "6 Clicks" Daily Activity  Outcome Measure  Difficulty turning over in bed (including adjusting bedclothes, sheets and blankets)?: Unable Difficulty moving from lying on back to sitting on the side of the bed? : Unable Difficulty sitting down on and standing up from a chair with arms (e.g., wheelchair, bedside commode, etc,.)?: Unable Help needed moving to and from a bed to chair (including a wheelchair)?: A Lot Help needed walking in hospital room?: A Lot Help needed climbing 3-5 steps with a railing? : A Lot 6 Click Score: 9    End of Session Equipment Utilized During Treatment: Gait belt;Oxygen(on 2LO2 however O2 >90% with activity on RA. Replaced O2) Activity Tolerance: Patient limited by fatigue Patient left: in chair;with call bell/phone within reach;with family/visitor present Nurse Communication: Mobility status PT Visit Diagnosis: Unsteadiness on feet (R26.81);Muscle weakness (generalized) (M62.81);Difficulty in walking, not elsewhere classified (R26.2)     Time: 5366-4403 PT Time Calculation (min) (ACUTE ONLY): 38 min  Charges:  $Gait Training: 8-22 mins $Therapeutic Exercise: 8-22 mins $Therapeutic Activity: 8-22  mins                     [place] Pager:  [phone]  Office:  [place] 06/20/2018, 11:29 AM

## 2018-06-20 NOTE — Care Management Important Message (Signed)
Important Message  Patient Details  Name: Lee [name] MRN: [id number] Date of Birth: Jan 05, 1931   Medicare Important Message Given:  Yes    Ashawna Hanback 06/20/2018, 2:36 PM

## 2018-06-20 NOTE — Clinical Social Work Placement (Signed)
   CLINICAL SOCIAL WORK PLACEMENT  NOTE  Date:  06/20/2018  Patient Details  Name: Lee [name] MRN: [id number] Date of Birth: 02-25-1931  Clinical Social Work is seeking post-discharge placement for this patient at the [place] level of care (*CSW will initial, date and re-position this form in  chart as items are completed):  Yes   Patient/family provided with [place] Work Department's list of facilities offering this level of care within the geographic area requested by the patient (or if unable, by the patient's family).  Yes   Patient/family informed of their freedom to choose among providers that offer the needed level of care, that participate in Medicare, Medicaid or managed care program needed by the patient, have an available bed and are willing to accept the patient.  Yes   Patient/family informed of [place]'s ownership interest in [facility] and [facility], as well as of the fact that they are under no obligation to receive care at these facilities.  PASRR submitted to EDS on 06/18/18     PASRR number received on 06/18/18     Existing PASRR number confirmed on       FL2 transmitted to all facilities in geographic area requested by pt/family on 06/18/18     FL2 transmitted to all facilities within larger geographic area on       Patient informed that his/her managed care company has contracts with or will negotiate with certain facilities, including the following:  [facility]         Patient/family informed of bed offers received.  Patient chooses bed at [facility]     Physician recommends and patient chooses bed at      Patient to be transferred to [facility] on 06/20/18.  Patient to be transferred to facility by [facility]     Patient family notified on 06/20/18 of transfer.  Name of family member notified:  Shanda Bumps, spouse     PHYSICIAN Please prepare  priority discharge summary, including medications, Please prepare prescriptions     Additional Comment:    _______________________________________________ Estanislado Emms, LCSW 06/20/2018, 2:38 PM

## 2018-06-20 NOTE — Progress Notes (Signed)
PROGRESS NOTE  Lee [name] OVZ:[id number] DOB: 1931/07/19 DOA: 06/15/2018 PCP: [name], MD  HPI/Recap of past 24 hours:  84 y.o.malewith medical history significant ofidiopathic pulmonaryfibrosis,pulmonary hypertension, nonischemic cardiomyopathy, hyperlipidemia,previousCVA and chronic hypoxic respiratory failure admitted on 06/15/2018 with fevers, cough worsening hypoxia and dyspnea and findings consistent with right-sided subsegmental and segmental PE as well as pneumonia.  06/20/2018: Patient seen and examined with his wife and sitter at bedside.  Alert but minimally interactive.  Has no new complaints.  Wheezing on exam.  Will start breathing treatments around-the-clock.  Appears weak.  PT recommended SNF.  CSW working on bed placement.  Assessment/Plan: Principal Problem:   Pulmonary emboli (HCC) Active Problems:   Hypercholesterolemia   GERD (gastroesophageal reflux disease)   IPF (idiopathic pulmonary fibrosis) (HCC)   Essential hypertension   Cardiomyopathy, nonischemic (HCC) - EF 40-45% by echo, but nearly 50% by Myoview. Anterior akinesis seen on echo not seen on Myoview.   Aspiration pneumonia (HCC)   Altered mental status  Right-sided PE No right heart strain Continue Eliquis O2 supplementation to maintain O2 saturation greater than 92%  Right-sided pneumonia Continue Augmentin twice daily Start DuoNeb every 6 hours and every 2 hours as needed Start pulmonary toilet hypersaline nebs and Mucinex 1200 mg twice daily  Hypertension Blood pressures well controlled Continue home antihypertensive medications  Chronic diastolic CHF Continue cardiac medications  Hyperlipidemia Continue Pravachol  Ambulatory dysfunction/physical debility Continue PT Fall precautions CSW working on SNF placement    Code Status: Full code  Family Communication: Wife at bedside.  All questions answered to her satisfaction.  Disposition Plan: SNF pending insurance  authorization   Consultants:  None  Procedures:  None  Antimicrobials:  Augmentin  DVT prophylaxis: Eliquis   Objective: Vitals:   06/19/18 2012 06/19/18 2129 06/20/18 0518 06/20/18 0914  BP:  133/62 (!) 146/68   Pulse:  70 71   Resp:  (!) 22 (!) 26   Temp:  98.4 F (36.9 C) 97.7 F (36.5 C)   TempSrc:  Oral Oral   SpO2: 94% 94% 96% 96%  Weight:   96.7 kg   Height:        Intake/Output Summary (Last 24 hours) at 06/20/2018 1124 Last data filed at 06/19/2018 1900 Gross per 24 hour  Intake 360 ml  Output 1 ml  Net 359 ml   Filed Weights   06/18/18 0708 06/19/18 0357 06/20/18 0518  Weight: 96.8 kg 96.9 kg 96.7 kg    Exam:  . General: 84 y.o. year-old male well developed well nourished in no acute distress.  Alert and minimally interactive. . Cardiovascular: Regular rate and rhythm with no rubs or gallops.  No thyromegaly or JVD noted.   [name] Respiratory: Mild rales at bases with diffuse wheezes bilaterally.  Good inspiratory effort. . Abdomen: Soft nontender nondistended with normal bowel sounds x4 quadrants. . Musculoskeletal: Trace lower extremity edema. 2/4 pulses in all 4 extremities. . Skin: No ulcerative lesions noted or rashes, . Psychiatry: Mood is appropriate for condition and setting   Data Reviewed: CBC: Recent Labs  Lab 06/15/18 1329 06/16/18 0347 06/17/18 0446 06/18/18 0556 06/19/18 0343  WBC 16.6* 16.3* 13.8* 11.9* 11.0*  NEUTROABS 14.3*  --   --   --   --   HGB 13.5 11.6* 11.7* 11.9* 11.7*  HCT 41.5 34.3* 36.2* 35.8* 35.3*  MCV 94.7 92.7 93.5 92.5 91.9  PLT 279 222 230 245 412   Basic Metabolic Panel: Recent Labs  Lab 06/15/18  1329 06/16/18 0347 06/19/18 0343  NA 138 138 137  K 3.9 3.2* 4.1  CL 106 108 107  CO2 25 23 23   GLUCOSE 122* 132* 117*  BUN 10 13 14   CREATININE 1.05 1.00 0.81  CALCIUM 8.8* 8.2* 8.7*   GFR: Estimated Creatinine Clearance: 75.2 mL/min (by C-G formula based on SCr of 0.81 mg/dL). Liver Function  Tests: Recent Labs  Lab 06/15/18 1329 06/16/18 0347  AST 23 22  ALT 19 16  ALKPHOS 62 54  BILITOT 0.9 0.9  PROT 6.4* 5.1*  ALBUMIN 3.5 2.6*   No results for input(s): LIPASE, AMYLASE in the last 168 hours. No results for input(s): AMMONIA in the last 168 hours. Coagulation Profile: No results for input(s): INR, PROTIME in the last 168 hours. Cardiac Enzymes: No results for input(s): CKTOTAL, CKMB, CKMBINDEX, TROPONINI in the last 168 hours. BNP (last 3 results) No results for input(s): PROBNP in the last 8760 hours. HbA1C: No results for input(s): HGBA1C in the last 72 hours. CBG: No results for input(s): GLUCAP in the last 168 hours. Lipid Profile: No results for input(s): CHOL, HDL, LDLCALC, TRIG, CHOLHDL, LDLDIRECT in the last 72 hours. Thyroid Function Tests: No results for input(s): TSH, T4TOTAL, FREET4, T3FREE, THYROIDAB in the last 72 hours. Anemia Panel: No results for input(s): VITAMINB12, FOLATE, FERRITIN, TIBC, IRON, RETICCTPCT in the last 72 hours. Urine analysis:    Component Value Date/Time   COLORURINE STRAW (A) 06/15/2018 1850   APPEARANCEUR CLEAR 06/15/2018 1850   LABSPEC 1.006 06/15/2018 1850   PHURINE 5.0 06/15/2018 1850   GLUCOSEU NEGATIVE 06/15/2018 1850   HGBUR NEGATIVE 06/15/2018 1850   BILIRUBINUR NEGATIVE 06/15/2018 1850   KETONESUR NEGATIVE 06/15/2018 1850   PROTEINUR NEGATIVE 06/15/2018 1850   UROBILINOGEN 1.0 04/30/2015 2123   NITRITE NEGATIVE 06/15/2018 1850   LEUKOCYTESUR TRACE (A) 06/15/2018 1850   Sepsis Labs: @LABRCNTIP (procalcitonin:4,lacticidven:4)  ) Recent Results (from the past 240 hour(s))  Blood Culture (routine x 2)     Status: None   Collection Time: 06/15/18  1:36 PM  Result Value Ref Range Status   Specimen Description BLOOD RIGHT ANTECUBITAL  Final   Special Requests   Final    BOTTLES DRAWN AEROBIC AND ANAEROBIC Blood Culture adequate volume   Culture   Final    NO GROWTH 5 DAYS Performed at [facility], [place] [address], [place], [place] [address]    Report Status 06/20/2018 FINAL  Final  Blood Culture (routine x 2)     Status: None   Collection Time: 06/15/18  1:38 PM  Result Value Ref Range Status   Specimen Description BLOOD LEFT HAND  Final   Special Requests   Final    BOTTLES DRAWN AEROBIC ONLY Blood Culture results may not be optimal due to an excessive volume of blood received in culture bottles   Culture   Final    NO GROWTH 5 DAYS Performed at [facility], [place] [address], [place], [place] [address]    Report Status 06/20/2018 FINAL  Final  MRSA PCR Screening     Status: None   Collection Time: 06/16/18  7:02 AM  Result Value Ref Range Status   MRSA by PCR NEGATIVE NEGATIVE Final    Comment:        The GeneXpert MRSA Assay (FDA approved for NASAL specimens only), is one component of a comprehensive MRSA colonization surveillance program. It is not intended to diagnose MRSA infection nor to guide or monitor  treatment for MRSA infections. Performed at [facility], [place] [address], [place], [place] [address]       Studies: No results found.  Scheduled Meds: . acidophilus  1 capsule Oral Daily  . albuterol  2.5 mg Inhalation TID  . amoxicillin-clavulanate  1 tablet Oral BID  . apixaban  10 mg Oral BID   Followed by  . [START ON 06/26/2018] apixaban  5 mg Oral BID  . carvedilol  12.5 mg Oral BID WC  . cholecalciferol  1,000 Units Oral Daily  . ezetimibe  10 mg Oral Daily  . feeding supplement (ENSURE ENLIVE)  237 mL Oral BID BM  . furosemide  20 mg Oral Q M,W,F  . guaiFENesin  300 mg Oral Q6H  . losartan  50 mg Oral Daily  . multivitamin with minerals  1 tablet Oral Daily  . pantoprazole  40 mg Oral Daily  . polyethylene glycol  17 g Oral Daily  . potassium chloride  10 mEq Oral Daily  . pravastatin  40 mg Oral q1800    Continuous Infusions: . sodium chloride 10 mL/hr at 06/19/18 0606     LOS: 5 days     Kayleen Memos, MD [facility] Pager [phone]  If 7PM-7AM, please contact night-coverage [email or web address] Password [email or web address] 06/20/2018, 11:24 AM

## 2018-06-20 NOTE — Progress Notes (Signed)
CSW received call from [facility]. SNF has insurance authorization for patient to admit today. Paged MD. CSW to follow.  Lee [name], [place]

## 2018-06-21 ENCOUNTER — Non-Acute Institutional Stay (SKILLED_NURSING_FACILITY): Payer: Medicare Other | Admitting: Internal Medicine

## 2018-06-21 ENCOUNTER — Encounter: Payer: Self-pay | Admitting: Internal Medicine

## 2018-06-21 DIAGNOSIS — I428 Other cardiomyopathies: Secondary | ICD-10-CM

## 2018-06-21 DIAGNOSIS — J690 Pneumonitis due to inhalation of food and vomit: Secondary | ICD-10-CM | POA: Diagnosis not present

## 2018-06-21 DIAGNOSIS — I5032 Chronic diastolic (congestive) heart failure: Secondary | ICD-10-CM

## 2018-06-21 DIAGNOSIS — I119 Hypertensive heart disease without heart failure: Secondary | ICD-10-CM | POA: Diagnosis not present

## 2018-06-21 DIAGNOSIS — E785 Hyperlipidemia, unspecified: Secondary | ICD-10-CM

## 2018-06-21 DIAGNOSIS — I2609 Other pulmonary embolism with acute cor pulmonale: Secondary | ICD-10-CM | POA: Diagnosis not present

## 2018-06-21 DIAGNOSIS — J84112 Idiopathic pulmonary fibrosis: Secondary | ICD-10-CM

## 2018-06-21 NOTE — Progress Notes (Signed)
:  Location:  [facility] Room Number: 782U Place of Service:  SNF (31)  Yvonda Fouty D. Sheppard Coil, MD  Patient Care Team: [name], MD as PCP - General (Internal Medicine)  Extended Emergency Contact Information Primary Emergency Contact: Rozeboom,[name] Address: [place] [address] [address] of [place] Phone: [phone] Mobile Phone: [phone] Relation: Spouse Secondary Emergency Contact: Dellie Burns [place] of [place] Phone: [phone] Relation: Daughter     Allergies: Ciprofloxacin and Tape  Chief Complaint  Patient presents with  . New Admit To SNF    Admit to [facility]    HPI: Patient is 84 y.o. male with idiopathic pulmonary fibrosis, pulmonary hypertension, nonischemic cardiomyopathy, hyperlipidemia, previous CVA and chronic hypoxic respiratory failure who presented with fevers cough worsening hypoxia and dyspnea.  In the ED patient was found to have a right sided subsegmental and segmental PE as well as pneumonia.  Patient was admitted to [facility] from 10/20 5-30 where he was treated for PE and aspiration pneumonia with IV antibiotics and Augmentin and duo nebs patient improved but still very weak and so admitted to SNF for OT/PT.  Nursing facility patient will be followed for hyperlipidemia treated with Pravachol, chronic diastolic congestive heart failure treated with Lasix Cozaar and Coreg, and hypertension treated with Coreg Lasix and losartan.  Past Medical History:  Diagnosis Date  . Allergic rhinitis   . Anxiety   . Basal cell carcinoma of face   . Cardiomyopathy ([place]) 04/2015   EF 40-45% by echo. Apical anterior akinesis seen on echo, not confirmed on Myoview was negative for ischemia. Myoview suggested a small basal inferoseptal defect.  . Cholecystitis    a. complex admit 04/2015 due to sepsis due to acute Escherichia coli cholecystitis and right lower lobe CAP and  Citrobacter UTI.  [name] Chronic respiratory failure ([place])    a. on home O2 after discharge 04/2015.  [name] Chronic systolic CHF (congestive heart failure) ([place])    a. Dx 04/2015 during complex admission for cholecystitis - EF 40-45% +WMA.  [name] COPD (chronic obstructive pulmonary disease) ([place])   . Erectile dysfunction   . History of endovascular stent graft for abdominal aortic aneurysm 2002   a. s/p repair  Followed by Dr. Donnetta Hutching (stable evaluation 01/2106)  . History of hiatal hernia   . HTN (hypertension)   . Hyperlipidemia   . Interstitial lung disease ([place])    a. followed by pulm.  [name] LBBB (left bundle branch block)    a. Intermittent - seen in 2013, not in 09/2013, but again in 04/2015.  . Obesity   . On home oxygen therapy    "just at night" (10/05/2015)  . PVD (peripheral vascular disease) ([place])   . Shingles   . Stroke [facility])    a. H/o aphasia - was told he'd had ministrokes. Imaging 09/2014 revealed prior infarct.    Past Surgical History:  Procedure Laterality Date  . AORTA SURGERY  2002  . CHOLECYSTECTOMY N/A 10/05/2015   Procedure: LAPAROSCOPIC CHOLECYSTECTOMY  ;  Surgeon: Autumn Messing III, MD;  Location: [facility];  Service: General;  Laterality: N/A;  . [place]  . INTRAOPERATIVE CHOLANGIOGRAM  10/05/2015   Procedure: INTRAOPERATIVE CHOLANGIOGRAM;  Surgeon: Autumn Messing III, MD;  Location: [facility];  Service: General;;  . LAPAROSCOPIC CHOLECYSTECTOMY  10/05/2015  . NM MYOVIEW LTD  05/2015   EF 49%. Small size,  mild severity perfusion defect in the basal-mid inferoseptal wall. LOW RISK. No ischemia. Defect thought to be related to LBBB artifacts and not true lesion. This could also explain mild systolic dysfunction.  . TRANSTHORACIC ECHOCARDIOGRAM  04/2015   (In setting of acute illness).EF 40-45% with mid-apical anteroseptal akinesis. Moderately increased PA pressures of roughly 48 mmHg.  [name] URETHRAL STRICTURE DILATATION  late 1970s   urinary tract stretch    Allergies as of 06/21/2018       Reactions   Ciprofloxacin Other (See Comments)   Tingle feeling throughout body   Tape Other (See Comments)   Redness, Please use "paper" tape      Medication List        Accurate as of 06/21/18 11:27 AM. Always use your most recent med list.          albuterol 108 (90 Base) MCG/ACT inhaler Commonly known as:  PROVENTIL HFA;VENTOLIN HFA Inhale 2 puffs into the lungs every 6 (six) hours as needed for wheezing or shortness of breath.   amoxicillin-clavulanate 875-125 MG tablet Commonly known as:  AUGMENTIN Take 1 tablet by mouth 2 (two) times daily for 5 days.   apixaban 5 MG Tabs tablet Commonly known as:  ELIQUIS Take 2 tablets (10 mg total) by mouth 2 (two) times daily.   apixaban 5 MG Tabs tablet Commonly known as:  ELIQUIS Take 1 tablet (5 mg total) by mouth 2 (two) times daily. Start taking on:  06/26/2018   benzonatate 200 MG capsule Commonly known as:  TESSALON Take 1 capsule (200 mg total) by mouth 2 (two) times daily as needed for cough.   carvedilol 12.5 MG tablet Commonly known as:  COREG TAKE 1 TABLET (12.5 MG TOTAL) BY MOUTH 2 (TWO) TIMES DAILY WITH A MEAL.   clopidogrel 75 MG tablet Commonly known as:  PLAVIX Take 75 mg by mouth daily.   ezetimibe 10 MG tablet Commonly known as:  ZETIA Take 10 mg by mouth daily.   feeding supplement (ENSURE ENLIVE) Liqd Take 237 mLs by mouth 2 (two) times daily between meals.   furosemide 20 MG tablet Commonly known as:  LASIX Take 20 mg by mouth every Monday, Wednesday, and Friday.   guaiFENesin 600 MG 12 hr tablet Commonly known as:  MUCINEX Take 2 tablets (1,200 mg total) by mouth 2 (two) times daily.   ipratropium-albuterol 0.5-2.5 (3) MG/3ML Soln Commonly known as:  DUONEB Take 3 mLs by nebulization every 6 (six) hours.   LIVALO 2 MG Tabs Generic drug:  Pitavastatin Calcium Take 2 mg by mouth See admin instructions. Every 3rd day   losartan 50 MG tablet Commonly known as:  COZAAR Take 50 mg by  mouth daily.   multivitamin with minerals Tabs tablet Take 1 tablet by mouth daily.   nitroGLYCERIN 0.4 MG SL tablet Commonly known as:  NITROSTAT Place 1 tablet (0.4 mg total) under the tongue every 5 (five) minutes as needed for chest pain.   pantoprazole 40 MG tablet Commonly known as:  PROTONIX Take 40 mg by mouth daily.   PHILLIPS COLON HEALTH Caps Take 1 capsule by mouth daily.   potassium chloride 10 MEQ CR capsule Commonly known as:  MICRO-K Take 10 mEq by mouth every Monday, Wednesday, and Friday. **Take with lasix**   Tiotropium Bromide-Olodaterol 2.5-2.5 MCG/ACT Aers Inhale 2 puffs into the lungs daily.   TYLENOL PO Take 600 mg by mouth as needed (headache).   VITAMIN D PO Take 1 tablet by mouth daily.  No orders of the defined types were placed in this encounter.   Immunization History  Administered Date(s) Administered  . Influenza Split 06/22/2012, 06/22/2013, 06/03/2015  . Influenza, High Dose Seasonal PF 04/22/2016, 05/03/2017  . Influenza-Unspecified 06/22/2014  . Pneumococcal Polysaccharide-23 06/22/2010  . Tdap 09/22/2012    Social History   Tobacco Use  . Smoking status: Former Smoker    Packs/day: 1.00    Years: 25.00    Pack years: 25.00    Types: Cigarettes    Last attempt to quit: 08/22/1974    Years since quitting: 43.8  . Smokeless tobacco: Never Used  Substance Use Topics  . Alcohol use: No    Family history is   Family History  Problem Relation Age of Onset  . Hypertension Mother   . Stroke Father   . Heart disease Father   . Colon cancer Brother       Review of Systems  DATA OBTAINED: from patient-limited; nurse no acute concerns except for very weak GENERAL:  no fevers, fatigue, appetite changes SKIN: No itching, or rash EYES: No eye pain, redness, discharge EARS: No earache, tinnitus, change in hearing NOSE: No congestion, drainage or bleeding  MOUTH/THROAT: No mouth or tooth pain, No sore  throat RESPIRATORY: No cough, wheezing, SOB CARDIAC: No chest pain, palpitations, lower extremity edema  GI: No abdominal pain, No N/V/D or constipation, No heartburn or reflux  GU: No dysuria, frequency or urgency, or incontinence  MUSCULOSKELETAL: No unrelieved bone/joint pain NEUROLOGIC: No headache, dizziness or focal weakness PSYCHIATRIC: No c/o anxiety or sadness   Vitals:   06/21/18 1125  BP: 127/67  Pulse: 76  Resp: 18  Temp: 97.6 F (36.4 C)    SpO2 Readings from Last 1 Encounters:  06/20/18 94%   Body mass index is 32.39 kg/m.     Physical Exam  GENERAL APPEARANCE: Alert, conversant,  No acute distress.  SKIN: No diaphoresis rash HEAD: Normocephalic, atraumatic  EYES: Conjunctiva/lids clear. Pupils round, reactive. EOMs intact.  EARS: External exam WNL, canals clear. Hearing grossly normal.  NOSE: No deformity or discharge.  MOUTH/THROAT: Lips w/o lesions  RESPIRATORY: Breathing is even, unlabored. Lung sounds are with decent airflow but some wheezing CARDIOVASCULAR: Heart RRR no murmurs, rubs or gallops. No peripheral edema.   GASTROINTESTINAL: Abdomen is soft, non-tender, not distended w/ normal bowel sounds. GENITOURINARY: Bladder non tender, not distended  MUSCULOSKELETAL: No abnormal joints or musculature NEUROLOGIC:  Cranial nerves 2-12 grossly intact. Moves all extremities: Minimally interactive PSYCHIATRIC: Mood and affect flat, no behavioral issues  Patient Active Problem List   Diagnosis Date Noted  . Pulmonary emboli ([place]) 06/15/2018  . Aspiration pneumonia ([place]) 06/15/2018  . Altered mental status 06/15/2018  . Chest pain 05/30/2017  . Stroke ([place])   . UTI (urinary tract infection) 05/02/2015  . Cardiomyopathy, nonischemic (HCC) - EF 40-45% by echo, but nearly 50% by Myoview. Anterior akinesis seen on echo not seen on Myoview. 05/02/2015  . Sepsis ([place]) 04/30/2015  . Essential hypertension 04/30/2015  . Abdominal pain 04/30/2015  .  Hyperlipidemia with target LDL less than 100 04/30/2015  . CAP (community acquired pneumonia)   . IPF (idiopathic pulmonary fibrosis) ([place]) 10/22/2014  . Chronic cough 10/22/2014  . GERD (gastroesophageal reflux disease) 02/05/2013  . Other emphysema ([place]) 10/23/2012  . Idiopathic pulmonary fibrosis, high clinical pretest probability, declined surgical lung biopsy 09/13/2012  . Dyspnea on exertion 08/03/2012  . Hypercholesterolemia 08/03/2012  . Benign hypertensive heart disease without heart failure 08/03/2012  .  H/O abdominal aortic aneurysm repair 08/03/2012  . Ventral hernia 08/03/2012      Labs reviewed: Basic Metabolic Panel:    Component Value Date/Time   NA 137 06/19/2018 0343   K 4.1 06/19/2018 0343   CL 107 06/19/2018 0343   CO2 23 06/19/2018 0343   GLUCOSE 117 (H) 06/19/2018 0343   BUN 14 06/19/2018 0343   CREATININE 0.81 06/19/2018 0343   CREATININE 1.02 09/07/2015 1701   CALCIUM 8.7 (L) 06/19/2018 0343   PROT 5.1 (L) 06/16/2018 0347   ALBUMIN 2.6 (L) 06/16/2018 0347   AST 22 06/16/2018 0347   ALT 16 06/16/2018 0347   ALKPHOS 54 06/16/2018 0347   BILITOT 0.9 06/16/2018 0347   GFRNONAA >60 06/19/2018 0343   GFRAA >60 06/19/2018 0343    Recent Labs    06/15/18 1329 06/16/18 0347 06/19/18 0343  NA 138 138 137  K 3.9 3.2* 4.1  CL 106 108 107  CO2 25 23 23   GLUCOSE 122* 132* 117*  BUN 10 13 14   CREATININE 1.05 1.00 0.81  CALCIUM 8.8* 8.2* 8.7*   Liver Function Tests: Recent Labs    06/15/18 1329 06/16/18 0347  AST 23 22  ALT 19 16  ALKPHOS 62 54  BILITOT 0.9 0.9  PROT 6.4* 5.1*  ALBUMIN 3.5 2.6*   No results for input(s): LIPASE, AMYLASE in the last 8760 hours. No results for input(s): AMMONIA in the last 8760 hours. CBC: Recent Labs    06/15/18 1329  06/17/18 0446 06/18/18 0556 06/19/18 0343  WBC 16.6*   < > 13.8* 11.9* 11.0*  NEUTROABS 14.3*  --   --   --   --   HGB 13.5   < > 11.7* 11.9* 11.7*  HCT 41.5   < > 36.2* 35.8* 35.3*   MCV 94.7   < > 93.5 92.5 91.9  PLT 279   < > 230 245 263   < > = values in this interval not displayed.   Lipid No results for input(s): CHOL, HDL, LDLCALC, TRIG in the last 8760 hours.  Cardiac Enzymes: No results for input(s): CKTOTAL, CKMB, CKMBINDEX, TROPONINI in the last 8760 hours. BNP: Recent Labs    06/15/18 1329  BNP 141.4*   No results found for: MICROALBUR No results found for: HGBA1C Lab Results  Component Value Date   TSH 0.878 05/01/2015   No results found for: VITAMINB12 No results found for: FOLATE No results found for: IRON, TIBC, FERRITIN  Imaging and Procedures obtained prior to SNF admission: Ct Angio Chest Pe W And/or Wo Contrast  Result Date: 06/15/2018 CLINICAL DATA:  Cough, chest pain EXAM: CT ANGIOGRAPHY CHEST WITH CONTRAST TECHNIQUE: Multidetector CT imaging of the chest was performed using the standard protocol during bolus administration of intravenous contrast. Multiplanar CT image reconstructions and MIPs were obtained to evaluate the vascular anatomy. CONTRAST:  140mL ISOVUE-370 IOPAMIDOL (ISOVUE-370) INJECTION 76% COMPARISON:  CXR 06/15/2018 FINDINGS: Cardiovascular: Satisfactory opacification of the pulmonary arteries to the segmental level. Small segmental and subsegmental right lower lobe pulmonary emboli. Moderate aortic atherosclerosis. No aneurysm. Coronary vascular calcification. Borderline cardiomegaly. No pericardial effusion Mediastinum/Nodes: Midline trachea. No thyroid mass. Esophagus within normal limits. No significant adenopathy. Moderate hiatal hernia Lungs/Pleura: Moderate severe emphysema. Ground-glass density and consolidation in the posterior right upper lobe consistent with a pneumonia. Upper Abdomen: No acute abnormality. Musculoskeletal: No acute or suspicious abnormality. Degenerative change Review of the MIP images confirms the above findings. IMPRESSION: 1. Small segmental and subsegmental right lower  lobe pulmonary emboli. 2.  Moderate to severe emphysema. Consolidation and ground-glass density in the posterior right upper lobe consistent with a pneumonia Critical Value/emergent results were called by telephone at the time of interpretation on 06/15/2018 at 6:12 pm to [facility] in the ED , who verbally acknowledged these results. Aortic Atherosclerosis (ICD10-I70.0) and Emphysema (ICD10-J43.9). Electronically Signed   By: Donavan Foil M.D.   On: 06/15/2018 18:13   Dg Chest Port 1 View  Result Date: 06/15/2018 CLINICAL DATA:  Respiratory distress. EXAM: PORTABLE CHEST 1 VIEW COMPARISON:  05/07/2018 and 07/28/2017 FINDINGS: Heart size and pulmonary vascularity are within normal limits. Lungs are clear except for slight scarring at the left lung base. No effusions. No acute bone abnormality. IMPRESSION: No acute abnormality. Aortic Atherosclerosis (ICD10-I70.0). Electronically Signed   By: Lorriane Shire M.D.   On: 06/15/2018 13:18     Not all labs, radiology exams or other studies done during hospitalization come through on my EPIC note; however they are reviewed by me.    Assessment and Plan  Pulmonary emboli- right-sided subsegmental and segmental; no right heart strain; placed on Eliquis and O2 to maintain saturation greater than 90% SNF- admitted for OT/PT; continue Eliquis 10 mg twice daily until 11/5 and then 5 mg twice daily  Aspiration pneumonia-right side;  SNF-continue Augmentin 875 twice daily for 5 more days; DuoNeb every 6 hours scheduled and every 2 hours as needed and Mucinex 1200 mg twice daily  Hypertension SNF- controlled continue Coreg 12.5 twice daily, Lasix 20 mg Monday Wednesday Friday, Cozaar 50 mg daily  Chronic diastolic congestive heart failure- cardiomyopathy nonischemic with ejection fraction 40 to 45% by echo by almost 50% by Myoview SNF- continue Lasix 20 mg Monday Wednesday Friday, Cozaar 50 mg daily and Coreg 12.5 mg twice daily  Hyperlipidemia SNF- not stated as uncontrolled;  continueLivalo 2 mg 1/2 tablet every third day and Zetia 10 mg daily   Time spent greater than 45 minutes;> 50% of time with patient was spent reviewing records, labs, tests and studies, counseling and developing plan of care  Webb Silversmith D. Sheppard Coil, MD

## 2018-06-23 ENCOUNTER — Encounter: Payer: Self-pay | Admitting: Internal Medicine

## 2018-06-23 DIAGNOSIS — I5032 Chronic diastolic (congestive) heart failure: Secondary | ICD-10-CM

## 2018-06-27 LAB — BASIC METABOLIC PANEL
BUN: 15 (ref 4–21)
Creatinine: 0.9 (ref 0.6–1.3)
Glucose: 104
Potassium: 4.8 (ref 3.4–5.3)
Sodium: 137 (ref 137–147)

## 2018-06-27 LAB — CBC AND DIFFERENTIAL
HCT: 38 — AB (ref 41–53)
Hemoglobin: 12.9 — AB (ref 13.5–17.5)
PLATELETS: 372 (ref 150–399)
WBC: 11.5

## 2018-07-04 NOTE — Progress Notes (Signed)
Cardiology Office Note   Date:  07/05/2018   ID:  [name], DOB 1931/02/09, MRN [id number]  PCP:  [name], MD  Cardiologist: Dr. Ellyn Hack Chief Complaint  Patient presents with  . Hospitalization Follow-up     History of Present Illness: Lee [name] is a 84 y.o. male who presents for post hospitalization follow up after being seen on consultation for chest pain by Dr. Gwenlyn Found. The patient has a history of NICM, EF of 44%-31%, chronic diastolic CHF, chronic LBBB, AAA s.p surgical repair followed by Dr. Donnetta Hutching, HTN, chronic LBBB, HLD, piror CVA, O2 dependent COPD, with pulmonary fibrosis.   NM stress test and echocardiogram was completed. Echo demonstrated poor acoustic windows, with normal LVEF, and mild LVH. The stress test was negative for ischemia and found to be low risk.   He was found to have pneumonia and treated with antibiotics, and subsegmental right sides PE. He was discharged to SNF for PT. He was started on Eliquis, continued on supplemental O2.   He is doing very well at the facility, [facility]. He is participating in all therapy, PT as well as OT, walking in the halls with assistance. He denies chest pain.  Past Medical History:  Diagnosis Date  . Allergic rhinitis   . Anxiety   . Basal cell carcinoma of face   . Cardiomyopathy ([place]) 04/2015   EF 40-45% by echo. Apical anterior akinesis seen on echo, not confirmed on Myoview was negative for ischemia. Myoview suggested a small basal inferoseptal defect.  . Cholecystitis    a. complex admit 04/2015 due to sepsis due to acute Escherichia coli cholecystitis and right lower lobe CAP and Citrobacter UTI.  [name] Chronic respiratory failure ([place])    a. on home O2 after discharge 04/2015.  [name] Chronic systolic CHF (congestive heart failure) ([place])    a. Dx 04/2015 during complex admission for cholecystitis - EF 40-45% +WMA.  [name] COPD (chronic obstructive pulmonary disease) ([place])   . Erectile  dysfunction   . History of endovascular stent graft for abdominal aortic aneurysm 2002   a. s/p repair  Followed by Dr. Donnetta Hutching (stable evaluation 01/2106)  . History of hiatal hernia   . HTN (hypertension)   . Hyperlipidemia   . Interstitial lung disease ([place])    a. followed by pulm.  [name] LBBB (left bundle branch block)    a. Intermittent - seen in 2013, not in 09/2013, but again in 04/2015.  . Obesity   . On home oxygen therapy    "just at night" (10/05/2015)  . PVD (peripheral vascular disease) ([place])   . Shingles   . Stroke [facility])    a. H/o aphasia - was told he'd had ministrokes. Imaging 09/2014 revealed prior infarct.    Past Surgical History:  Procedure Laterality Date  . AORTA SURGERY  2002  . CHOLECYSTECTOMY N/A 10/05/2015   Procedure: LAPAROSCOPIC CHOLECYSTECTOMY  ;  Surgeon: Autumn Messing III, MD;  Location: [place];  Service: General;  Laterality: N/A;  . [place]  . INTRAOPERATIVE CHOLANGIOGRAM  10/05/2015   Procedure: INTRAOPERATIVE CHOLANGIOGRAM;  Surgeon: Autumn Messing III, MD;  Location: [place];  Service: General;;  . LAPAROSCOPIC CHOLECYSTECTOMY  10/05/2015  . NM MYOVIEW LTD  05/2015   EF 49%. Small size, mild severity perfusion defect in the basal-mid inferoseptal wall. LOW RISK. No ischemia. Defect thought to be related to LBBB artifacts and not true lesion. This could also explain mild systolic dysfunction.  [name]  TRANSTHORACIC ECHOCARDIOGRAM  04/2015   (In setting of acute illness).EF 40-45% with mid-apical anteroseptal akinesis. Moderately increased PA pressures of roughly 48 mmHg.  [name] URETHRAL STRICTURE DILATATION  late 1970s   urinary tract stretch     Current Outpatient Medications  Medication Sig Dispense Refill  . Acetaminophen (TYLENOL PO) Take 600 mg by mouth as needed (headache).     [name] albuterol (PROVENTIL HFA;VENTOLIN HFA) 108 (90 BASE) MCG/ACT inhaler Inhale 2 puffs into the lungs every 6 (six) hours as needed for wheezing or shortness of breath. 1 Inhaler 0  .  apixaban (ELIQUIS) 5 MG TABS tablet Take 1 tablet (5 mg total) by mouth 2 (two) times daily. 60 tablet 0  . benzonatate (TESSALON) 200 MG capsule Take 1 capsule (200 mg total) by mouth 2 (two) times daily as needed for cough. 20 capsule 0  . carvedilol (COREG) 12.5 MG tablet TAKE 1 TABLET (12.5 MG TOTAL) BY MOUTH 2 (TWO) TIMES DAILY WITH A MEAL. (Patient taking differently: Take 12.5 mg by mouth 2 (two) times daily with a meal. ) 180 tablet 3  . Cholecalciferol (VITAMIN D PO) Take 1 tablet by mouth daily.    . clopidogrel (PLAVIX) 75 MG tablet Take 75 mg by mouth daily.   6  . ezetimibe (ZETIA) 10 MG tablet Take 10 mg by mouth daily.     . feeding supplement, ENSURE ENLIVE, (ENSURE ENLIVE) LIQD Take 237 mLs by mouth 2 (two) times daily between meals. 237 mL 12  . furosemide (LASIX) 20 MG tablet Take 20 mg by mouth every Monday, Wednesday, and Friday.  6  . guaiFENesin (MUCINEX) 600 MG 12 hr tablet Take 2 tablets (1,200 mg total) by mouth 2 (two) times daily. 20 tablet 0  . ipratropium-albuterol (DUONEB) 0.5-2.5 (3) MG/3ML SOLN Take 3 mLs by nebulization every 6 (six) hours. 360 mL 0  . losartan (COZAAR) 50 MG tablet Take 50 mg by mouth daily.    . Multiple Vitamin (MULTIVITAMIN WITH MINERALS) TABS tablet Take 1 tablet by mouth daily.    . nitroGLYCERIN (NITROSTAT) 0.4 MG SL tablet Place 1 tablet (0.4 mg total) under the tongue every 5 (five) minutes as needed for chest pain. 30 tablet 0  . pantoprazole (PROTONIX) 40 MG tablet Take 40 mg by mouth daily.  11  . Pitavastatin Calcium (LIVALO) 2 MG TABS Take 2 mg by mouth See admin instructions. Every 3rd day     . potassium chloride (MICRO-K) 10 MEQ CR capsule Take 10 mEq by mouth every Monday, Wednesday, and Friday. **Take with lasix**  6  . Probiotic Product ([place]) CAPS Take 1 capsule by mouth daily.    . Tiotropium Bromide-Olodaterol (STIOLTO RESPIMAT) 2.5-2.5 MCG/ACT AERS Inhale 2 puffs into the lungs daily. 1 Inhaler 0   No  current facility-administered medications for this visit.     Allergies:   Ciprofloxacin and Tape    Social History:  The patient  reports that he quit smoking about 43 years ago. His smoking use included cigarettes. He has a 25.00 pack-year smoking history. He has never used smokeless tobacco. He reports that he does not drink alcohol or use drugs.   Family History:  The patient's family history includes Colon cancer in his brother; Heart disease in his father; Hypertension in his mother; Stroke in his father.    ROS: All other systems are reviewed and negative. Unless otherwise mentioned in H&P    PHYSICAL EXAM: VS:  BP 139/73   Pulse  94   Ht 5\' 8"  (1.727 m)   Wt 202 lb 12.8 oz (92 kg)   BMI 30.84 kg/m  , BMI Body mass index is 30.84 kg/m. GEN: Well nourished, well developed, in no acute distress HEENT: normal Neck: no JVD, carotid bruits, or masses Cardiac: RRR; 1/6 systolic  murmurs, rubs, or gallops,no edema  Respiratory:  Clear to auscultation bilaterally,in the upper lobes, bibasilar crackles in the bases L>R. Wearing O2. GI: soft, nontender, nondistended, + BS MS: no deformity or atrophy, cool LEE on the left with brace in place.  Skin: warm and dry, no rash Neuro:  Strength and sensation are diminished. Dysphasia is noted, but he responds appropriately.  Psych: euthymic mood, full affect   EKG: Not completed this office visit.   Recent Labs: 06/15/2018: B Natriuretic Peptide 141.4 06/16/2018: ALT 16 06/27/2018: BUN 15; Creatinine 0.9; Hemoglobin 12.9; Platelets 372; Potassium 4.8; Sodium 137    Lipid Panel No results found for: CHOL, TRIG, HDL, CHOLHDL, VLDL, LDLCALC, LDLDIRECT    Wt Readings from Last 3 Encounters:  07/05/18 202 lb 12.8 oz (92 kg)  06/21/18 213 lb (96.6 kg)  06/20/18 213 lb 3 oz (96.7 kg)    Other studies Reviewed: Echocardiogram 07-18-18 Left ventricle: POor acoustic windows limit study, even with use   of Definity. Overall LVEF  appears normal. The cavity size was   normal. Wall thickness was increased in a pattern of mild LVH.  CXR 06/15/2018 Heart size and pulmonary vascularity are within normal limits. Lungs are clear except for slight scarring at the left lung base. No effusions. No acute bone abnormality.   ASSESSMENT AND PLAN:  1. Pneumonia: Currently finished antibiotics but has some crackles in the bases, left greater than right. I will order a PA and Lateral CXR for evaluation. He is also ordered an incentive spirometry unit while awake. He will see [place] again in one month.   2. Diastolic CHF: Other than lung congestion, no evidence of volume overload. He continues on diuretics. Will repeat labs in one month. Depending on results of CXR, will make changes in medication regimen if necessary.   3. Bilateral PE: Continue Eliquis. No evidence or complaints of bleeding.   4. NICM: Normal LVEF per echo on recent admission. Stress test also normal no changes in regimen.  5. Deconditioning: Continue PT/OT. He is actively participating in this He is encouraged to continue his participation as he is making good progress.    Current medicines are reviewed at length with the patient today.    Labs/ tests ordered today include:CXR  Phill Myron. [place] Pugh, ANP, AACC   07/05/2018 9:50 AM    [place] [address] Office [phone] Fax [phone]

## 2018-07-05 ENCOUNTER — Ambulatory Visit
Admission: RE | Admit: 2018-07-05 | Discharge: 2018-07-05 | Disposition: A | Discharge status: Home / Self Care | Payer: Medicare Other | Source: Ambulatory Visit | Attending: Adult Health | Admitting: Adult Health

## 2018-07-05 ENCOUNTER — Ambulatory Visit (INDEPENDENT_AMBULATORY_CARE_PROVIDER_SITE_OTHER): Payer: Medicare Other | Admitting: Adult Health

## 2018-07-05 ENCOUNTER — Encounter: Payer: Self-pay | Admitting: Adult Health

## 2018-07-05 VITALS — BP 139/73 | HR 94 | Ht 68.0 in | Wt 202.8 lb

## 2018-07-05 DIAGNOSIS — R053 Chronic cough: Secondary | ICD-10-CM

## 2018-07-05 DIAGNOSIS — R05 Cough: Secondary | ICD-10-CM

## 2018-07-05 DIAGNOSIS — I2694 Multiple subsegmental pulmonary emboli without acute cor pulmonale: Secondary | ICD-10-CM

## 2018-07-05 DIAGNOSIS — J690 Pneumonitis due to inhalation of food and vomit: Secondary | ICD-10-CM | POA: Diagnosis not present

## 2018-07-05 DIAGNOSIS — I5032 Chronic diastolic (congestive) heart failure: Secondary | ICD-10-CM | POA: Diagnosis not present

## 2018-07-05 DIAGNOSIS — R0989 Other specified symptoms and signs involving the circulatory and respiratory systems: Secondary | ICD-10-CM

## 2018-07-05 NOTE — Patient Instructions (Addendum)
Medication Instructions:  NO CHANGES- Your physician recommends that you continue on your current medications as directed. Please refer to the Current Medication list given to you today.  If you need a refill on your cardiac medications before your next appointment, please call your pharmacy.  Labwork: If you have labs (blood work) drawn today and your tests are completely normal, you will receive your results only by: [name] MyChart Message (if you have MyChart) OR . A paper copy in the mail If you have any lab test that is abnormal or we need to change your treatment, we will call you to review the results.  Testing/Procedures: Chest xray - Your physician has requested that you have a chest xray, is a fast and painless imaging test that uses certain electromagnetic waves to create pictures of the structures in and around your chest. This test can help diagnose and monitor conditions such as pneumonia and other lung issues this will be done at [facility] [address], [place]. If you should need to call them their phone number is [phone].  USE INCENTIVE SPIROMETER EVERY 2 HOURS DAILY  Follow-Up: You will need a follow up appointment in 1 MONTH.    You may see  DR Cecil Cranker, DNP, ANP or one of the following Advanced Practice Providers on your designated Care Team:    . Jory Sims, DNP, ANP . Rosaria Ferries, PA-C  At [facility], you and your health needs are our priority.  As part of our continuing mission to provide you with exceptional heart care, we have created designated Provider Care Teams.  These Care Teams include your primary Cardiologist (physician) and Advanced Practice Providers (APPs -  Physician Assistants and Nurse Practitioners) who all work together to provide you with the care you need, when you need it.  Thank you for choosing [facility] at [facility]!!

## 2018-07-09 NOTE — Progress Notes (Signed)
@Patient  ID: [name], male    DOB: June 18, 1931, 84 y.o.   MRN: [id number]  Chief Complaint  Patient presents with  . Hospitalization Follow-up    RLL PNA / IPF / PE    Referring provider: [name], MD  HPI:  84 year old former smoker followed in our office for IPF and emphysema   PMH: Stroke, PE (on eliquis), CHF, Cardiomyopathy, Chronic Cough Smoker/ Smoking History: Former Smoker. 25 pack year history Maintenance:   Stiolto Pt of: Dr. Chase Caller  Recent [facility] Encounters:   05/07/2018-office visit- Dr. Chase Caller Plan: Chest x-ray, continue Stiolto, continue oxygen at night, follow-up with primary care regarding lower extremity swelling  07/10/2018  - Visit   84 year old presenting for [place] today.  Patient presents today with his caregiver who is with him 5 days a week.  Patient is pleasantly confused this is his baseline.  Patient reporting that patient is back to baseline.  With baseline fatigue and baseline shortness of breath.  Patient has working with physical therapy, Occupational Therapy, and speech therapy.  Patient is back to his regular diet.  Patient is with walking about 3 blocks a day with a Rollator walker with his caregiver.  They report he has not had any fevers, chills, increased cough, or wheezing.  Patient has not resumed Stiolto Respimat inhaler as patient is currently receiving duo nebs every 6 hours while he is at the [facility].  He reports peer planning on discharging the patient over the next couple of days.  Patient is back to 1 using 2 L of O2 at night.  Patient caregiver would like to have him evaluated today to see if he needs oxygen during the day.  Patient is on room air and oxygen saturations are 94%.  Patient caregiver also interested in getting a nebulizer nebulized medications at home.  This was the plan prior to patient being admitted to the hospital for right lower lobe pneumonia.     Tests:   01/26/2018-  swallowing study-modified barium swallow- premature spill and delayed swallow trigger noted, no evidence of vestibular penetration or aspiration, please refer to speech pathologist report for complete details and recommendations, mild aspiration risk 07/28/2017-chest x-ray- changes of pulmonary fibrosis are again seen, no acute disease 09/17/2012-CT chest without contrast- diffuse bronchial wall thickening and moderate centrilobular and paraseptal emphysema, compatible with underlying COPD, some patchy areas of very mild peripheral groundglass attenuation, subpleural reticulation and peripheral bronchiectasis atelectasis could suggest interstitial lung disease/NSIP 06/01/2017-echocardiogram- LV ejection fraction 50 to 44%, systolic function normal, grade 1 diastolic dysfunction  92/08/69-QR Angio- small segmental subsegmental right lower lobe pulmonary emboli, moderate to severe emphysema, consolidation and groundglass density in the posterior right upper lobe consistent with pneumonia.  FENO:  No results found for: NITRICOXIDE  PFT: PFT Results Latest Ref Rng & Units 04/18/2014 09/19/2013  FVC-Pre L 3.10 3.06  FVC-Predicted Pre % 98 96  FVC-Post L 3.11 3.10  FVC-Predicted Post % 98 97  Pre FEV1/FVC % % 74 74  Post FEV1/FCV % % 76 69  FEV1-Pre L 2.28 2.28  FEV1-Predicted Pre % 103 102  FEV1-Post L 2.37 2.16  DLCO UNC% % 58 55  DLCO COR %Predicted % 77 80  TLC L 7.84 -  TLC % Predicted % 129 -  RV % Predicted % 165 -    Imaging: Dg Chest 2 View  Result Date: 07/05/2018 CLINICAL DATA:  Pneumonia follow-up. EXAM: CHEST - 2 VIEW  COMPARISON:  Chest x-ray and CT chest dated June 15, 2018. FINDINGS: The heart size and mediastinal contours are within normal limits. Normal pulmonary vascularity. Stable emphysematous changes. Unchanged scarring at the left lung base. No focal consolidation, pleural effusion, or pneumothorax. No acute osseous abnormality. IMPRESSION: 1.  No active  cardiopulmonary disease. 2.  Emphysema (ICD10-J43.9). Electronically Signed   By: Titus Dubin M.D.   On: 07/05/2018 14:53   Ct Angio Chest Pe W And/or Wo Contrast  Result Date: 06/15/2018 CLINICAL DATA:  Cough, chest pain EXAM: CT ANGIOGRAPHY CHEST WITH CONTRAST TECHNIQUE: Multidetector CT imaging of the chest was performed using the standard protocol during bolus administration of intravenous contrast. Multiplanar CT image reconstructions and MIPs were obtained to evaluate the vascular anatomy. CONTRAST:  17mL ISOVUE-370 IOPAMIDOL (ISOVUE-370) INJECTION 76% COMPARISON:  CXR 06/15/2018 FINDINGS: Cardiovascular: Satisfactory opacification of the pulmonary arteries to the segmental level. Small segmental and subsegmental right lower lobe pulmonary emboli. Moderate aortic atherosclerosis. No aneurysm. Coronary vascular calcification. Borderline cardiomegaly. No pericardial effusion Mediastinum/Nodes: Midline trachea. No thyroid mass. Esophagus within normal limits. No significant adenopathy. Moderate hiatal hernia Lungs/Pleura: Moderate severe emphysema. Ground-glass density and consolidation in the posterior right upper lobe consistent with a pneumonia. Upper Abdomen: No acute abnormality. Musculoskeletal: No acute or suspicious abnormality. Degenerative change Review of the MIP images confirms the above findings. IMPRESSION: 1. Small segmental and subsegmental right lower lobe pulmonary emboli. 2. Moderate to severe emphysema. Consolidation and ground-glass density in the posterior right upper lobe consistent with a pneumonia Critical Value/emergent results were called by telephone at the time of interpretation on 06/15/2018 at 6:12 pm to [facility] in the ED , who verbally acknowledged these results. Aortic Atherosclerosis (ICD10-I70.0) and Emphysema (ICD10-J43.9). Electronically Signed   By: Donavan Foil M.D.   On: 06/15/2018 18:13   Dg Chest Port 1 View  Result Date: 06/15/2018 CLINICAL DATA:   Respiratory distress. EXAM: PORTABLE CHEST 1 VIEW COMPARISON:  05/07/2018 and 07/28/2017 FINDINGS: Heart size and pulmonary vascularity are within normal limits. Lungs are clear except for slight scarring at the left lung base. No effusions. No acute bone abnormality. IMPRESSION: No acute abnormality. Aortic Atherosclerosis (ICD10-I70.0). Electronically Signed   By: Lorriane Shire M.D.   On: 06/15/2018 13:18    Chart Review:  06/15/2018-admission/emphysema respiratory distress >>>Discharge date 06/20/2018 >>>Plan: Follow-up with PCP, cardiologist, pulmonologist, continue physical therapy at skilled nursing facility, fall precautions   Specialty Problems      Pulmonary Problems   Dyspnea on exertion   Other emphysema (HCC)   Chronic cough   IPF (idiopathic pulmonary fibrosis) ([place])   CAP (community acquired pneumonia)    06/15/2018-CT Angio- small segmental subsegmental right lower lobe pulmonary emboli, moderate to severe emphysema, consolidation and groundglass density in the posterior right upper lobe consistent with pneumonia.      Aspiration pneumonia (HCC)      Allergies  Allergen Reactions  . Ciprofloxacin Other (See Comments)    Tingle feeling throughout body  . Tape Other (See Comments)    Redness, Please use "paper" tape    Immunization History  Administered Date(s) Administered  . Influenza Split 06/22/2012, 06/22/2013, 06/03/2015  . Influenza, High Dose Seasonal PF 04/22/2016, 05/03/2017, 06/18/2018  . Influenza-Unspecified 06/22/2014  . Pneumococcal Polysaccharide-23 06/22/2010  . Tdap 09/22/2012    Past Medical History:  Diagnosis Date  . Allergic rhinitis   . Anxiety   . Basal cell carcinoma of face   . Cardiomyopathy ([place]) 04/2015   EF 40-45% by  echo. Apical anterior akinesis seen on echo, not confirmed on Myoview was negative for ischemia. Myoview suggested a small basal inferoseptal defect.  . Cholecystitis    a. complex admit 04/2015 due to sepsis due  to acute Escherichia coli cholecystitis and right lower lobe CAP and Citrobacter UTI.  [name] Chronic respiratory failure ([place])    a. on home O2 after discharge 04/2015.  [name] Chronic systolic CHF (congestive heart failure) ([place])    a. Dx 04/2015 during complex admission for cholecystitis - EF 40-45% +WMA.  [name] COPD (chronic obstructive pulmonary disease) ([place])   . Erectile dysfunction   . History of endovascular stent graft for abdominal aortic aneurysm 2002   a. s/p repair  Followed by Dr. Donnetta Hutching (stable evaluation 01/2106)  . History of hiatal hernia   . HTN (hypertension)   . Hyperlipidemia   . Interstitial lung disease ([place])    a. followed by pulm.  [name] LBBB (left bundle branch block)    a. Intermittent - seen in 2013, not in 09/2013, but again in 04/2015.  . Obesity   . On home oxygen therapy    "just at night" (10/05/2015)  . PVD (peripheral vascular disease) ([place])   . Shingles   . Stroke [facility])    a. H/o aphasia - was told he'd had ministrokes. Imaging 09/2014 revealed prior infarct.    Tobacco History: Social History   Tobacco Use  Smoking Status Former Smoker  . Packs/day: 1.00  . Years: 25.00  . Pack years: 25.00  . Types: Cigarettes  . Last attempt to quit: 08/22/1974  . Years since quitting: 43.9  Smokeless Tobacco Never Used   Counseling given: Yes  Continue to not smoke  Outpatient Encounter Medications as of 07/10/2018  Medication Sig  . albuterol (PROVENTIL HFA;VENTOLIN HFA) 108 (90 BASE) MCG/ACT inhaler Inhale 2 puffs into the lungs every 6 (six) hours as needed for wheezing or shortness of breath.  [name] apixaban (ELIQUIS) 5 MG TABS tablet Take 1 tablet (5 mg total) by mouth 2 (two) times daily.  . carvedilol (COREG) 12.5 MG tablet TAKE 1 TABLET (12.5 MG TOTAL) BY MOUTH 2 (TWO) TIMES DAILY WITH A MEAL. (Patient taking differently: Take 12.5 mg by mouth 2 (two) times daily with a meal. )  . Cholecalciferol (VITAMIN D PO) Take 1 tablet by mouth daily.  . clopidogrel (PLAVIX) 75  MG tablet Take 75 mg by mouth daily.   [name] ezetimibe (ZETIA) 10 MG tablet Take 10 mg by mouth daily.   . feeding supplement, ENSURE ENLIVE, (ENSURE ENLIVE) LIQD Take 237 mLs by mouth 2 (two) times daily between meals.  . furosemide (LASIX) 20 MG tablet Take 20 mg by mouth every Monday, Wednesday, and Friday.  [name] ipratropium-albuterol (DUONEB) 0.5-2.5 (3) MG/3ML SOLN Take 3 mLs by nebulization every 6 (six) hours.  [name] losartan (COZAAR) 50 MG tablet Take 50 mg by mouth daily.  . Multiple Vitamin (MULTIVITAMIN WITH MINERALS) TABS tablet Take 1 tablet by mouth daily.  . nitroGLYCERIN (NITROSTAT) 0.4 MG SL tablet Place 1 tablet (0.4 mg total) under the tongue every 5 (five) minutes as needed for chest pain.  [name] olopatadine (PATANOL) 0.1 % ophthalmic solution Place 1 drop into the left eye 2 (two) times daily.  . pantoprazole (PROTONIX) 40 MG tablet Take 40 mg by mouth daily.  . Pitavastatin Calcium (LIVALO) 2 MG TABS Take 2 mg by mouth See admin instructions. Every 3rd day   . potassium chloride (MICRO-K) 10 MEQ CR capsule Take  10 mEq by mouth every Monday, Wednesday, and Friday. **Take with lasix**  . Probiotic Product ([place]) CAPS Take 1 capsule by mouth daily.  . Acetaminophen (TYLENOL PO) Take 600 mg by mouth as needed (headache).   [name] albuterol (PROVENTIL) (2.5 MG/3ML) 0.083% nebulizer solution Take 3 mLs (2.5 mg total) by nebulization every 6 (six) hours as needed for wheezing or shortness of breath.  . benzonatate (TESSALON) 200 MG capsule Take 1 capsule (200 mg total) by mouth 2 (two) times daily as needed for cough. (Patient not taking: Reported on 07/10/2018)  . guaiFENesin (MUCINEX) 600 MG 12 hr tablet Take 2 tablets (1,200 mg total) by mouth 2 (two) times daily. (Patient not taking: Reported on 07/10/2018)  . Tiotropium Bromide-Olodaterol (STIOLTO RESPIMAT) 2.5-2.5 MCG/ACT AERS Inhale 2 puffs into the lungs daily.  . [DISCONTINUED] Tiotropium Bromide-Olodaterol (STIOLTO RESPIMAT)  2.5-2.5 MCG/ACT AERS Inhale 2 puffs into the lungs daily. (Patient not taking: Reported on 07/10/2018)   No facility-administered encounter medications on file as of 07/10/2018.      Review of Systems  Review of Systems  Constitutional: Positive for fatigue (baseline). Negative for activity change, chills, fever and unexpected weight change.  HENT: Negative for postnasal drip, sinus pressure, sinus pain and sore throat.   Eyes: Negative.   Respiratory: Positive for shortness of breath (chronic back to baseline). Negative for cough and wheezing.   Cardiovascular: Negative for chest pain, palpitations and leg swelling.  Gastrointestinal: Negative for constipation, diarrhea, nausea and vomiting.  Endocrine: Negative.   Musculoskeletal: Positive for gait problem (fall risk ).  Skin: Negative.   Neurological: Negative for dizziness and headaches.  Psychiatric/Behavioral: Positive for confusion. Negative for dysphoric mood. The patient is not nervous/anxious.   All other systems reviewed and are negative.    Physical Exam  BP 112/60 (BP Location: Right Arm, Cuff Size: Normal)   Pulse 97   SpO2 95%   Wt Readings from Last 5 Encounters:  07/05/18 202 lb 12.8 oz (92 kg)  06/21/18 213 lb (96.6 kg)  06/20/18 213 lb 3 oz (96.7 kg)  05/07/18 207 lb 9.6 oz (94.2 kg)  02/26/18 204 lb 6.4 oz (92.7 kg)   RA  Physical Exam  Constitutional: He is oriented to person, place, and time and well-developed, well-nourished, and in no distress. No distress.  HENT:  Head: Normocephalic and atraumatic.  Right Ear: Hearing, tympanic membrane, external ear and ear canal normal.  Left Ear: Hearing, tympanic membrane, external ear and ear canal normal.  Nose: Mucosal edema present. Right sinus exhibits no maxillary sinus tenderness and no frontal sinus tenderness. Left sinus exhibits no maxillary sinus tenderness and no frontal sinus tenderness.  Mouth/Throat: Uvula is midline and oropharynx is clear and  moist. No oropharyngeal exudate.  Eyes: Pupils are equal, round, and reactive to light.  Neck: Normal range of motion. Neck supple. No JVD present.  Cardiovascular: Normal rate, regular rhythm and normal heart sounds.  Pulmonary/Chest: Effort normal. No accessory muscle usage. No respiratory distress. He has no decreased breath sounds. He has no wheezes. He has no rhonchi.  BB velcro crackles  Abdominal: Soft. Bowel sounds are normal. There is no tenderness.  Musculoskeletal: Normal range of motion. He exhibits edema (trace LE bilaterally ).  In wheelchair   Lymphadenopathy:    He has no cervical adenopathy.  Neurological: He is alert and oriented to person, place, and time.  Skin: Skin is warm and dry. He is not diaphoretic. No erythema.  Psychiatric: Mood,  memory, affect and judgment normal.  Nursing note and vitals reviewed.     Lab Results:  CBC    Component Value Date/Time   WBC 11.5 06/27/2018   WBC 11.0 (H) 06/19/2018 0343   RBC 3.84 (L) 06/19/2018 0343   HGB 12.9 (A) 06/27/2018   HCT 38 (A) 06/27/2018   PLT 372 06/27/2018   MCV 91.9 06/19/2018 0343   MCH 30.5 06/19/2018 0343   MCHC 33.1 06/19/2018 0343   RDW 13.1 06/19/2018 0343   LYMPHSABS 1.1 06/15/2018 1329   MONOABS 1.0 06/15/2018 1329   EOSABS 0.1 06/15/2018 1329   BASOSABS 0.0 06/15/2018 1329    BMET    Component Value Date/Time   NA 137 06/27/2018   K 4.8 06/27/2018   CL 107 06/19/2018 0343   CO2 23 06/19/2018 0343   GLUCOSE 117 (H) 06/19/2018 0343   BUN 15 06/27/2018   CREATININE 0.9 06/27/2018   CREATININE 0.81 06/19/2018 0343   CREATININE 1.02 09/07/2015 1701   CALCIUM 8.7 (L) 06/19/2018 0343   GFRNONAA >60 06/19/2018 0343   GFRAA >60 06/19/2018 0343    BNP    Component Value Date/Time   BNP 141.4 (H) 06/15/2018 1329    ProBNP No results found for: PROBNP   Assessment & Plan:   Pleasant 84 year old male patient completing hospital follow-up today with his caretaker.  Patient  appears to be improving from right lower lobe pneumonia as well as PE.  Will follow right lower lobe pneumonia with a CT Angio 12 weeks from previous CT Angio.  Goal to have this completed in January/2020.  We will have patient follow-up with [place] after that.  Patient is discharged from skilled nursing facility patient start Stiolto Respimat inhaler.  We have refilled that medication today.  I will also order as well as albuterol nebulized medications. Pt to stop duoneb nebulized medication when they are discharged and restart stiolto. Okay for patient to start using oxygen only at night.  Patient continues oxygen as needed during the day in order to maintain oxygen saturations greater than 90%.  Patient continue to work on mobility working with physical therapy, occupational therapy and speech therapy while at skilled nursing facility.  I have discussed this with patient spouse via telephone she called during the appointment.  I have answered all of her questions.  She knows to follow-up with [place] in 3 months after completion of CT angiogram sooner if patient has worsening symptoms.  All of her questions were answered.  I also answered all the questions for pt caregiver.  Pulmonary emboli (HCC) Continue Eliquis   Fall Precautions   CT Angio in Jan / 2020  BMET in lab work in Jan / 2020  Follow up with our office in 3 month after CT Angio   Other emphysema Restart Stiolto Respimat inhaler when you get home  >>>2 puffs daily >>>Take this no matter what >>>This is not a rescue inhaler >>>We will refill this medication   We have ordered a nebulizer   Albuterol neb treatments  >>> can use every 6 hours needed   CT Angio in Jan / 2020  BMET in lab work in Jan / 2020  Follow up with our office in 3 month after CT Angio   IPF (idiopathic pulmonary fibrosis) ([place]) Restart Stiolto Respimat inhaler when you get home  >>>2 puffs daily >>>Take this no matter what >>>This is not a rescue  inhaler >>>We will refill this medication   We have ordered a  nebulizer   Albuterol neb treatments  >>> can use every 6 hours needed   CT Angio in Jan / 2020  BMET in lab work in Jan / 2020  Follow up with our office in 3 month after CT Angio   Aspiration pneumonia ([place]) Continue to follow swallowing precautions   This appointment was 46 minutes long with over 50% of that time direct face-to-face patient care, assessment, plan of care follow-up   Lauraine Rinne, NP 07/10/2018

## 2018-07-10 ENCOUNTER — Encounter: Payer: Self-pay | Admitting: Pulmonary Disease

## 2018-07-10 ENCOUNTER — Ambulatory Visit: Payer: Medicare Other | Admitting: Pulmonary Disease

## 2018-07-10 VITALS — BP 112/60 | HR 97

## 2018-07-10 DIAGNOSIS — Z8701 Personal history of pneumonia (recurrent): Secondary | ICD-10-CM | POA: Insufficient documentation

## 2018-07-10 DIAGNOSIS — J438 Other emphysema: Secondary | ICD-10-CM

## 2018-07-10 DIAGNOSIS — J84112 Idiopathic pulmonary fibrosis: Secondary | ICD-10-CM | POA: Diagnosis not present

## 2018-07-10 DIAGNOSIS — J181 Lobar pneumonia, unspecified organism: Secondary | ICD-10-CM

## 2018-07-10 DIAGNOSIS — J189 Pneumonia, unspecified organism: Secondary | ICD-10-CM

## 2018-07-10 DIAGNOSIS — I2609 Other pulmonary embolism with acute cor pulmonale: Secondary | ICD-10-CM | POA: Diagnosis not present

## 2018-07-10 DIAGNOSIS — J690 Pneumonitis due to inhalation of food and vomit: Secondary | ICD-10-CM

## 2018-07-10 MED ORDER — TIOTROPIUM BROMIDE-OLODATEROL 2.5-2.5 MCG/ACT IN AERS: 2.0000 | INHALATION_SPRAY | Freq: Every day | RESPIRATORY_TRACT | 0 refills | Status: DC

## 2018-07-10 MED ORDER — ALBUTEROL SULFATE (2.5 MG/3ML) 0.083% IN NEBU: 2.5000 mg | INHALATION_SOLUTION | Freq: Four times a day (QID) | RESPIRATORY_TRACT | 12 refills | Status: DC | PRN

## 2018-07-10 NOTE — Assessment & Plan Note (Signed)
Continue Eliquis   Fall Precautions   CT Angio in Jan / 2020  BMET in lab work in Jan / 2020  Follow up with our office in 3 month after CT Angio

## 2018-07-10 NOTE — Patient Instructions (Addendum)
Restart Stiolto Respimat inhaler when you get home  >>>2 puffs daily >>>Take this no matter what >>>This is not a rescue inhaler >>>We will refill this medication   We have ordered a nebulizer   Albuterol neb treatments  >>> can use every 6 hours needed   CT Angio in Jan / 2020  BMET in lab work in Jan / 2020  Follow up with our office in 3 month after CT Angio   It is flu season:   >>>Remember to be washing your hands regularly, using hand sanitizer, be careful to use around herself with has contact with people who are sick will increase her chances of getting sick yourself. >>> Best ways to protect herself from the flu: Receive the yearly flu vaccine, practice good hand hygiene washing with soap and also using hand sanitizer when available, eat a nutritious meals, get adequate rest, hydrate appropriately   Please contact the office if your symptoms worsen or you have concerns that you are not improving.   Thank you for choosing [facility] for your healthcare, and for allowing [place] to partner with you on your healthcare journey. I am thankful to be able to provide care to you today.   Wyn Quaker FNP-C

## 2018-07-10 NOTE — Assessment & Plan Note (Signed)
Continue to follow swallowing precautions

## 2018-07-10 NOTE — Assessment & Plan Note (Signed)
Restart Stiolto Respimat inhaler when you get home  >>>2 puffs daily >>>Take this no matter what >>>This is not a rescue inhaler >>>We will refill this medication   We have ordered a nebulizer   Albuterol neb treatments  >>> can use every 6 hours needed   CT Angio in Jan / 2020  BMET in lab work in Jan / 2020  Follow up with our office in 3 month after CT Angio

## 2018-07-10 NOTE — Assessment & Plan Note (Addendum)
Restart Stiolto Respimat inhaler when you get home  >>>2 puffs daily >>>Take this no matter what >>>This is not a rescue inhaler >>>We will refill this medication   We have ordered a nebulizer   Albuterol neb treatments  >>> can use every 6 hours needed   CT Angio in Jan / 2020  BMET in lab work in Jan / 2020  Follow up with our office in 3 month after CT Angio

## 2018-07-13 ENCOUNTER — Encounter: Payer: Self-pay | Admitting: Internal Medicine

## 2018-07-13 ENCOUNTER — Non-Acute Institutional Stay (SKILLED_NURSING_FACILITY): Payer: Medicare Other | Admitting: Internal Medicine

## 2018-07-13 DIAGNOSIS — I428 Other cardiomyopathies: Secondary | ICD-10-CM

## 2018-07-13 DIAGNOSIS — I110 Hypertensive heart disease with heart failure: Secondary | ICD-10-CM

## 2018-07-13 DIAGNOSIS — I2609 Other pulmonary embolism with acute cor pulmonale: Secondary | ICD-10-CM

## 2018-07-13 DIAGNOSIS — J690 Pneumonitis due to inhalation of food and vomit: Secondary | ICD-10-CM | POA: Diagnosis not present

## 2018-07-13 DIAGNOSIS — J438 Other emphysema: Secondary | ICD-10-CM

## 2018-07-13 DIAGNOSIS — J84112 Idiopathic pulmonary fibrosis: Secondary | ICD-10-CM

## 2018-07-13 DIAGNOSIS — E785 Hyperlipidemia, unspecified: Secondary | ICD-10-CM

## 2018-07-13 DIAGNOSIS — I5022 Chronic systolic (congestive) heart failure: Secondary | ICD-10-CM

## 2018-07-13 MED ORDER — VITAMIN D 1000 UNITS PO TABS: 1000.0000 [IU] | ORAL_TABLET | Freq: Every day | ORAL | 0 refills | Status: AC

## 2018-07-13 MED ORDER — APIXABAN 5 MG PO TABS: 5.0000 mg | ORAL_TABLET | Freq: Two times a day (BID) | ORAL | 0 refills | Status: DC

## 2018-07-13 MED ORDER — IPRATROPIUM-ALBUTEROL 0.5-2.5 (3) MG/3ML IN SOLN: 3.0000 mL | Freq: Four times a day (QID) | RESPIRATORY_TRACT | 0 refills | Status: DC

## 2018-07-13 MED ORDER — FUROSEMIDE 20 MG PO TABS: 20.0000 mg | ORAL_TABLET | ORAL | 6 refills | Status: AC

## 2018-07-13 MED ORDER — PANTOPRAZOLE SODIUM 40 MG PO TBEC: 40.0000 mg | DELAYED_RELEASE_TABLET | Freq: Every day | ORAL | 10 refills | Status: AC

## 2018-07-13 MED ORDER — PITAVASTATIN CALCIUM 2 MG PO TABS: 2.0000 mg | ORAL_TABLET | ORAL | 0 refills | Status: AC

## 2018-07-13 MED ORDER — OLOPATADINE HCL 0.1 % OP SOLN: 1.0000 [drp] | Freq: Two times a day (BID) | OPHTHALMIC | 0 refills | Status: DC

## 2018-07-13 MED ORDER — ALBUTEROL SULFATE (2.5 MG/3ML) 0.083% IN NEBU: 2.5000 mg | INHALATION_SOLUTION | Freq: Four times a day (QID) | RESPIRATORY_TRACT | 12 refills | Status: DC | PRN

## 2018-07-13 MED ORDER — ALBUTEROL SULFATE HFA 108 (90 BASE) MCG/ACT IN AERS: 2.0000 | INHALATION_SPRAY | Freq: Four times a day (QID) | RESPIRATORY_TRACT | 0 refills | Status: AC | PRN

## 2018-07-13 MED ORDER — PHILLIPS COLON HEALTH PO CAPS: 1.0000 | ORAL_CAPSULE | Freq: Every day | ORAL | 0 refills | Status: AC

## 2018-07-13 MED ORDER — NITROGLYCERIN 0.4 MG SL SUBL: 0.4000 mg | SUBLINGUAL_TABLET | SUBLINGUAL | 0 refills | Status: AC | PRN

## 2018-07-13 MED ORDER — TIOTROPIUM BROMIDE-OLODATEROL 2.5-2.5 MCG/ACT IN AERS: 2.0000 | INHALATION_SPRAY | Freq: Every day | RESPIRATORY_TRACT | 0 refills | Status: DC

## 2018-07-13 MED ORDER — ENSURE ENLIVE PO LIQD: 237.0000 mL | Freq: Two times a day (BID) | ORAL | 12 refills | Status: AC

## 2018-07-13 MED ORDER — LOSARTAN POTASSIUM 50 MG PO TABS: 50.0000 mg | ORAL_TABLET | Freq: Every day | ORAL | 0 refills | Status: AC

## 2018-07-13 MED ORDER — EZETIMIBE 10 MG PO TABS: 10.0000 mg | ORAL_TABLET | Freq: Every day | ORAL | 0 refills | Status: AC

## 2018-07-13 MED ORDER — GUAIFENESIN ER 600 MG PO TB12: 1200.0000 mg | ORAL_TABLET | Freq: Two times a day (BID) | ORAL | 0 refills | Status: DC

## 2018-07-13 MED ORDER — POTASSIUM CHLORIDE ER 10 MEQ PO CPCR: 10.0000 meq | ORAL_CAPSULE | ORAL | 5 refills | Status: AC

## 2018-07-13 MED ORDER — ADULT MULTIVITAMIN W/MINERALS CH: 1.0000 | ORAL_TABLET | Freq: Every day | ORAL | 0 refills | Status: AC

## 2018-07-13 MED ORDER — CARVEDILOL 12.5 MG PO TABS: ORAL_TABLET | ORAL | 3 refills | Status: DC

## 2018-07-13 MED ORDER — BENZONATATE 200 MG PO CAPS: 200.0000 mg | ORAL_CAPSULE | Freq: Two times a day (BID) | ORAL | 0 refills | Status: DC | PRN

## 2018-07-13 MED ORDER — CLOPIDOGREL BISULFATE 75 MG PO TABS: 75.0000 mg | ORAL_TABLET | Freq: Every day | ORAL | 5 refills | Status: AC

## 2018-07-13 NOTE — Progress Notes (Signed)
Location:  [name]: 272Z Place of Service:  SNF (31)  Lee [name]. Lee Coil, MD  Patient Care Team: [name], MD as PCP - General (Internal Medicine)  Extended Emergency Contact Information Primary Emergency Contact: [name],[name] Address: [place] [address] [name] Phone: [phone] Mobile Phone: [phone] Relation: Spouse Secondary Emergency Contact: Dellie Burns [place] of [place] Phone: [phone] Relation: Daughter  Allergies  Allergen Reactions  . Ciprofloxacin Other (See Comments)    Tingle feeling throughout body  . Tape Other (See Comments)    Redness, Please use "paper" tape    Chief Complaint  Patient presents with  . Discharge Note    Discharge from [facility]    HPI:  84 y.o. male with idiopathic pulmonary fibrosis, pulmonary hypertension, nonischemic cardiomyopathy, hyperlipidemia, previous CVA, and chronic hypoxic respiratory failure who presented with fevers, cough, worsening hypoxia and dyspnea.  In the ED patient was found to have a right-sided subsegmental and segmental PE as well as pneumonia.  Patient was admitted to [facility] from 10/20 5-30 where he was treated for PE and aspiration pneumonia with IV antibiotics and then Augmentin, and duo nebs.  Patient improved but still very weak so admitted to skilled nursing facility.  Patient is now ready to be discharged home    Past Medical History:  Diagnosis Date  . Allergic rhinitis   . Anxiety   . Basal cell carcinoma of face   . Cardiomyopathy ([place]) 04/2015   EF 40-45% by echo. Apical anterior akinesis seen on echo, not confirmed on Myoview was negative for ischemia. Myoview suggested a small basal inferoseptal defect.  . Cholecystitis    a. complex admit 04/2015 due to sepsis due to acute Escherichia coli cholecystitis and right lower lobe CAP and Citrobacter UTI.  [name]  Chronic respiratory failure ([place])    a. on home O2 after discharge 04/2015.  [name] Chronic systolic CHF (congestive heart failure) ([place])    a. Dx 04/2015 during complex admission for cholecystitis - EF 40-45% +WMA.  [name] COPD (chronic obstructive pulmonary disease) ([place])   . Erectile dysfunction   . History of endovascular stent graft for abdominal aortic aneurysm 2002   a. s/p repair  Followed by Dr. Donnetta Hutching (stable evaluation 01/2106)  . History of hiatal hernia   . HTN (hypertension)   . Hyperlipidemia   . Interstitial lung disease ([place])    a. followed by pulm.  [name] LBBB (left bundle branch block)    a. Intermittent - seen in 2013, not in 09/2013, but again in 04/2015.  . Obesity   . On home oxygen therapy    "just at night" (10/05/2015)  . PVD (peripheral vascular disease) ([place])   . Shingles   . Stroke [name])    a. H/o aphasia - was told he'd had ministrokes. Imaging 09/2014 revealed prior infarct.    Past Surgical History:  Procedure Laterality Date  . AORTA SURGERY  2002  . CHOLECYSTECTOMY N/A 10/05/2015   Procedure: LAPAROSCOPIC CHOLECYSTECTOMY  ;  Surgeon: Autumn Messing III, MD;  Location: [place];  Service: General;  Laterality: N/A;  . [place]  . INTRAOPERATIVE CHOLANGIOGRAM  10/05/2015   Procedure: INTRAOPERATIVE CHOLANGIOGRAM;  Surgeon: Autumn Messing III, MD;  Location: [place];  Service: General;;  . LAPAROSCOPIC CHOLECYSTECTOMY  10/05/2015  . NM MYOVIEW LTD  05/2015   EF 49%.  Small size, mild severity perfusion defect in the basal-mid inferoseptal wall. LOW RISK. No ischemia. Defect thought to be related to LBBB artifacts and not true lesion. This could also explain mild systolic dysfunction.  . TRANSTHORACIC ECHOCARDIOGRAM  04/2015   (In setting of acute illness).EF 40-45% with mid-apical anteroseptal akinesis. Moderately increased PA pressures of roughly 48 mmHg.  [name] URETHRAL STRICTURE DILATATION  late 1970s   urinary tract stretch     reports that he quit smoking about 43 years  ago. His smoking use included cigarettes. He has a 25.00 pack-year smoking history. He has never used smokeless tobacco. He reports that he does not drink alcohol or use drugs. Social History   Socioeconomic History  . Marital status: Married    Spouse name: Not on file  . Number of children: Not on file  . Years of education: Not on file  . Highest education level: Not on file  Occupational History  . Occupation: retired  Scientific laboratory technician  . Financial resource strain: Not on file  . Food insecurity:    Worry: Not on file    Inability: Not on file  . Transportation needs:    Medical: Not on file    Non-medical: Not on file  Tobacco Use  . Smoking status: Former Smoker    Packs/day: 1.00    Years: 25.00    Pack years: 25.00    Types: Cigarettes    Last attempt to quit: 08/22/1974    Years since quitting: 43.9  . Smokeless tobacco: Never Used  Substance and Sexual Activity  . Alcohol use: No  . Drug use: No  . Sexual activity: Not on file  Lifestyle  . Physical activity:    Days per week: Not on file    Minutes per session: Not on file  . Stress: Not on file  Relationships  . Social connections:    Talks on phone: Not on file    Gets together: Not on file    Attends religious service: Not on file    Active member of club or organization: Not on file    Attends meetings of clubs or organizations: Not on file    Relationship status: Not on file  . Intimate partner violence:    Fear of current or ex partner: Not on file    Emotionally abused: Not on file    Physically abused: Not on file    Forced sexual activity: Not on file  Other Topics Concern  . Not on file  Social History Narrative  . Not on file    Pertinent  Health Maintenance Due  Topic Date Due  . PNA vac Low Risk Adult (2 of 2 - PCV13) 06/23/2011  . INFLUENZA VACCINE  Completed    Medications: Allergies as of 07/13/2018      Reactions   Ciprofloxacin Other (See Comments)   Tingle feeling throughout  body   Tape Other (See Comments)   Redness, Please use "paper" tape      Medication List        Accurate as of 07/13/18  3:22 PM. Always use your most recent med list.          albuterol (2.5 MG/3ML) 0.083% nebulizer solution Commonly known as:  PROVENTIL Take 3 mLs (2.5 mg total) by nebulization every 6 (six) hours as needed for wheezing or shortness of breath.   albuterol 108 (90 Base) MCG/ACT inhaler Commonly known as:  PROVENTIL HFA;VENTOLIN HFA Inhale 2 puffs into the lungs  every 6 (six) hours as needed for wheezing or shortness of breath.   apixaban 5 MG Tabs tablet Commonly known as:  ELIQUIS Take 1 tablet (5 mg total) by mouth 2 (two) times daily.   benzonatate 200 MG capsule Commonly known as:  TESSALON Take 1 capsule (200 mg total) by mouth 2 (two) times daily as needed for cough.   carvedilol 12.5 MG tablet Commonly known as:  COREG TAKE 1 TABLET (12.5 MG TOTAL) BY MOUTH 2 (TWO) TIMES DAILY WITH A MEAL.   cholecalciferol 1000 units tablet Commonly known as:  VITAMIN D Take 1 tablet (1,000 Units total) by mouth daily.   clopidogrel 75 MG tablet Commonly known as:  PLAVIX Take 1 tablet (75 mg total) by mouth daily.   ezetimibe 10 MG tablet Commonly known as:  ZETIA Take 1 tablet (10 mg total) by mouth daily.   feeding supplement (ENSURE ENLIVE) Liqd Take 237 mLs by mouth 2 (two) times daily between meals.   furosemide 20 MG tablet Commonly known as:  LASIX Take 1 tablet (20 mg total) by mouth every Monday, Wednesday, and Friday.   guaiFENesin 600 MG 12 hr tablet Commonly known as:  MUCINEX Take 2 tablets (1,200 mg total) by mouth 2 (two) times daily.   ipratropium-albuterol 0.5-2.5 (3) MG/3ML Soln Commonly known as:  DUONEB Take 3 mLs by nebulization every 6 (six) hours.   losartan 50 MG tablet Commonly known as:  COZAAR Take 1 tablet (50 mg total) by mouth daily.   multivitamin with minerals Tabs tablet Take 1 tablet by mouth daily.     nitroGLYCERIN 0.4 MG SL tablet Commonly known as:  NITROSTAT Place 1 tablet (0.4 mg total) under the tongue every 5 (five) minutes as needed for chest pain.   olopatadine 0.1 % ophthalmic solution Commonly known as:  PATANOL Place 1 drop into the left eye 2 (two) times daily.   pantoprazole 40 MG tablet Commonly known as:  PROTONIX Take 1 tablet (40 mg total) by mouth daily.   PHILLIPS COLON HEALTH Caps Take 1 capsule by mouth daily.   Pitavastatin Calcium 2 MG Tabs Take 1 tablet (2 mg total) by mouth See admin instructions. Every 3rd day   potassium chloride 10 MEQ CR capsule Commonly known as:  MICRO-K Take 1 capsule (10 mEq total) by mouth every Monday, Wednesday, and Friday. **Take with lasix**   Tiotropium Bromide-Olodaterol 2.5-2.5 MCG/ACT Aers Inhale 2 puffs into the lungs daily.        Vitals:   07/13/18 1516  BP: 108/65  Pulse: 82  Resp: 18  Temp: (!) 97.3 F (36.3 C)  Weight: 202 lb (91.6 kg)  Height: 5\' 8"  (1.727 m)   Body mass index is 30.71 kg/m.  Physical Exam  GENERAL APPEARANCE: Alert, conversant. No acute distress.  HEENT: Unremarkable. RESPIRATORY: Breathing is even, unlabored. Lung sounds are clear   CARDIOVASCULAR: Heart RRR no murmurs, rubs or gallops. No peripheral edema.  GASTROINTESTINAL: Abdomen is soft, non-tender, not distended w/ normal bowel sounds.  NEUROLOGIC: Cranial nerves 2-12 grossly intact. Moves all extremities   Labs reviewed: Basic Metabolic Panel: Recent Labs    06/15/18 1329 06/16/18 0347 06/19/18 0343 06/27/18  NA 138 138 137 137  K 3.9 3.2* 4.1 4.8  CL 106 108 107  --   CO2 25 23 23   --   GLUCOSE 122* 132* 117*  --   BUN 10 13 14 15   CREATININE 1.05 1.00 0.81 0.9  CALCIUM 8.8* 8.2* 8.7*  --  No results found for: [facility] Liver Function Tests: Recent Labs    06/15/18 1329 06/16/18 0347  AST 23 22  ALT 19 16  ALKPHOS 62 54  BILITOT 0.9 0.9  PROT 6.4* 5.1*  ALBUMIN 3.5 2.6*   No results for  input(s): LIPASE, AMYLASE in the last 8760 hours. No results for input(s): AMMONIA in the last 8760 hours. CBC: Recent Labs    06/15/18 1329  06/17/18 0446 06/18/18 0556 06/19/18 0343 06/27/18  WBC 16.6*   < > 13.8* 11.9* 11.0* 11.5  NEUTROABS 14.3*  --   --   --   --   --   HGB 13.5   < > 11.7* 11.9* 11.7* 12.9*  HCT 41.5   < > 36.2* 35.8* 35.3* 38*  MCV 94.7   < > 93.5 92.5 91.9  --   PLT 279   < > 230 245 263 372   < > = values in this interval not displayed.   Lipid No results for input(s): CHOL, HDL, LDLCALC, TRIG in the last 8760 hours. Cardiac Enzymes: No results for input(s): CKTOTAL, CKMB, CKMBINDEX, TROPONINI in the last 8760 hours. BNP: Recent Labs    06/15/18 1329  BNP 141.4*   CBG: No results for input(s): GLUCAP in the last 8760 hours.  Procedures and Imaging Studies During Stay: Dg Chest 2 View  Result Date: 07/05/2018 CLINICAL DATA:  Pneumonia follow-up. EXAM: CHEST - 2 VIEW COMPARISON:  Chest x-ray and CT chest dated June 15, 2018. FINDINGS: The heart size and mediastinal contours are within normal limits. Normal pulmonary vascularity. Stable emphysematous changes. Unchanged scarring at the left lung base. No focal consolidation, pleural effusion, or pneumothorax. No acute osseous abnormality. IMPRESSION: 1.  No active cardiopulmonary disease. 2.  Emphysema (ICD10-J43.9). Electronically Signed   By: Titus Dubin M.D.   On: 07/05/2018 14:53   Ct Angio Chest Pe W And/or Wo Contrast  Result Date: 06/15/2018 CLINICAL DATA:  Cough, chest pain EXAM: CT ANGIOGRAPHY CHEST WITH CONTRAST TECHNIQUE: Multidetector CT imaging of the chest was performed using the standard protocol during bolus administration of intravenous contrast. Multiplanar CT image reconstructions and MIPs were obtained to evaluate the vascular anatomy. CONTRAST:  150mL ISOVUE-370 IOPAMIDOL (ISOVUE-370) INJECTION 76% COMPARISON:  CXR 06/15/2018 FINDINGS: Cardiovascular: Satisfactory opacification  of the pulmonary arteries to the segmental level. Small segmental and subsegmental right lower lobe pulmonary emboli. Moderate aortic atherosclerosis. No aneurysm. Coronary vascular calcification. Borderline cardiomegaly. No pericardial effusion Mediastinum/Nodes: Midline trachea. No thyroid mass. Esophagus within normal limits. No significant adenopathy. Moderate hiatal hernia Lungs/Pleura: Moderate severe emphysema. Ground-glass density and consolidation in the posterior right upper lobe consistent with a pneumonia. Upper Abdomen: No acute abnormality. Musculoskeletal: No acute or suspicious abnormality. Degenerative change Review of the MIP images confirms the above findings. IMPRESSION: 1. Small segmental and subsegmental right lower lobe pulmonary emboli. 2. Moderate to severe emphysema. Consolidation and ground-glass density in the posterior right upper lobe consistent with a pneumonia Critical Value/emergent results were called by telephone at the time of interpretation on 06/15/2018 at 6:12 pm to [facility] in the ED , who verbally acknowledged these results. Aortic Atherosclerosis (ICD10-I70.0) and Emphysema (ICD10-J43.9). Electronically Signed   By: Donavan Foil M.D.   On: 06/15/2018 18:13   Dg Chest Port 1 View  Result Date: 06/15/2018 CLINICAL DATA:  Respiratory distress. EXAM: PORTABLE CHEST 1 VIEW COMPARISON:  05/07/2018 and 07/28/2017 FINDINGS: Heart size and pulmonary vascularity are within normal limits. Lungs are clear except for slight scarring  at the left lung base. No effusions. No acute bone abnormality. IMPRESSION: No acute abnormality. Aortic Atherosclerosis (ICD10-I70.0). Electronically Signed   By: Lorriane Shire M.D.   On: 06/15/2018 13:18    Assessment/Plan:   No diagnosis found.   Patient is being discharged with the following home health services: OT/PT/social work  Patient is being discharged with the following durable medical equipment: None  Patient has been advised to  f/u with their PCP in 1-2 weeks to bring them up to date on their rehab stay.  Social services at facility was responsible for arranging this appointment.  Pt was provided with a 30 day supply of prescriptions for medications and refills must be obtained from their PCP.  For controlled substances, a more limited supply may be provided adequate until PCP appointment only.  Medications have been reconciled.  Time spent greater than 30 minutes;> 50% of time with patient was spent reviewing records, labs, tests and studies, counseling and developing plan of care  Lee [name]. Lee Coil, MD

## 2018-07-13 NOTE — Progress Notes (Signed)
Location:      Place of Service:     PCP: [name], MD Patient Care Team: [name], MD as PCP - General (Internal Medicine)  Extended Emergency Contact Information Primary Emergency Contact: Freda,[name] Address: [place] [address] [place] Phone: [phone] Mobile Phone: [phone] Relation: Spouse Secondary Emergency Contact: Dellie Burns [place] of [place] Phone: [phone] Relation: Daughter  Allergies  Allergen Reactions  . Ciprofloxacin Other (See Comments)    Tingle feeling throughout body  . Tape Other (See Comments)    Redness, Please use "paper" tape    No chief complaint on file.   HPI:  84 y.o. male      Past Medical History:  Diagnosis Date  . Allergic rhinitis   . Anxiety   . Basal cell carcinoma of face   . Cardiomyopathy ([place]) 04/2015   EF 40-45% by echo. Apical anterior akinesis seen on echo, not confirmed on Myoview was negative for ischemia. Myoview suggested a small basal inferoseptal defect.  . Cholecystitis    a. complex admit 04/2015 due to sepsis due to acute Escherichia coli cholecystitis and right lower lobe CAP and Citrobacter UTI.  [name] Chronic respiratory failure ([place])    a. on home O2 after discharge 04/2015.  [name] Chronic systolic CHF (congestive heart failure) ([place])    a. Dx 04/2015 during complex admission for cholecystitis - EF 40-45% +WMA.  [name] COPD (chronic obstructive pulmonary disease) ([place])   . Erectile dysfunction   . History of endovascular stent graft for abdominal aortic aneurysm 2002   a. s/p repair  Followed by Dr. Donnetta Hutching (stable evaluation 01/2106)  . History of hiatal hernia   . HTN (hypertension)   . Hyperlipidemia   . Interstitial lung disease ([place])    a. followed by pulm.  [name] LBBB (left bundle branch block)    a. Intermittent - seen in 2013, not in 09/2013, but again in 04/2015.  . Obesity   . On home oxygen therapy    "just at night"  (10/05/2015)  . PVD (peripheral vascular disease) ([place])   . Shingles   . Stroke [facility])    a. H/o aphasia - was told he'd had ministrokes. Imaging 09/2014 revealed prior infarct.    Past Surgical History:  Procedure Laterality Date  . AORTA SURGERY  2002  . CHOLECYSTECTOMY N/A 10/05/2015   Procedure: LAPAROSCOPIC CHOLECYSTECTOMY  ;  Surgeon: Autumn Messing III, MD;  Location: [facility];  Service: General;  Laterality: N/A;  . [place]  . INTRAOPERATIVE CHOLANGIOGRAM  10/05/2015   Procedure: INTRAOPERATIVE CHOLANGIOGRAM;  Surgeon: Autumn Messing III, MD;  Location: [place];  Service: General;;  . LAPAROSCOPIC CHOLECYSTECTOMY  10/05/2015  . NM MYOVIEW LTD  05/2015   EF 49%. Small size, mild severity perfusion defect in the basal-mid inferoseptal wall. LOW RISK. No ischemia. Defect thought to be related to LBBB artifacts and not true lesion. This could also explain mild systolic dysfunction.  . TRANSTHORACIC ECHOCARDIOGRAM  04/2015   (In setting of acute illness).EF 40-45% with mid-apical anteroseptal akinesis. Moderately increased PA pressures of roughly 48 mmHg.  [name] URETHRAL STRICTURE DILATATION  late 1970s   urinary tract stretch     reports that he quit smoking about 43 years ago. His smoking use included cigarettes. He has a 25.00 pack-year smoking history. He has never used smokeless tobacco. He reports that he does not drink alcohol or use  drugs. Social History   Socioeconomic History  . Marital status: Married    Spouse name: Not on file  . Number of children: Not on file  . Years of education: Not on file  . Highest education level: Not on file  Occupational History  . Occupation: retired  Scientific laboratory technician  . Financial resource strain: Not on file  . Food insecurity:    Worry: Not on file    Inability: Not on file  . Transportation needs:    Medical: Not on file    Non-medical: Not on file  Tobacco Use  . Smoking status: Former Smoker    Packs/day: 1.00    Years: 25.00    Pack  years: 25.00    Types: Cigarettes    Last attempt to quit: 08/22/1974    Years since quitting: 43.9  . Smokeless tobacco: Never Used  Substance and Sexual Activity  . Alcohol use: No  . Drug use: No  . Sexual activity: Not on file  Lifestyle  . Physical activity:    Days per week: Not on file    Minutes per session: Not on file  . Stress: Not on file  Relationships  . Social connections:    Talks on phone: Not on file    Gets together: Not on file    Attends religious service: Not on file    Active member of club or organization: Not on file    Attends meetings of clubs or organizations: Not on file    Relationship status: Not on file  . Intimate partner violence:    Fear of current or ex partner: Not on file    Emotionally abused: Not on file    Physically abused: Not on file    Forced sexual activity: Not on file  Other Topics Concern  . Not on file  Social History Narrative  . Not on file    Pertinent  Health Maintenance Due  Topic Date Due  . PNA vac Low Risk Adult (2 of 2 - PCV13) 06/23/2011  . INFLUENZA VACCINE  Completed    Medications: Allergies as of 07/13/2018      Reactions   Ciprofloxacin Other (See Comments)   Tingle feeling throughout body   Tape Other (See Comments)   Redness, Please use "paper" tape      Medication List        Accurate as of 07/13/18  1:18 PM. Always use your most recent med list.          albuterol 108 (90 Base) MCG/ACT inhaler Commonly known as:  PROVENTIL HFA;VENTOLIN HFA Inhale 2 puffs into the lungs every 6 (six) hours as needed for wheezing or shortness of breath.   albuterol (2.5 MG/3ML) 0.083% nebulizer solution Commonly known as:  PROVENTIL Take 3 mLs (2.5 mg total) by nebulization every 6 (six) hours as needed for wheezing or shortness of breath.   apixaban 5 MG Tabs tablet Commonly known as:  ELIQUIS Take 1 tablet (5 mg total) by mouth 2 (two) times daily.   benzonatate 200 MG capsule Commonly known as:   TESSALON Take 1 capsule (200 mg total) by mouth 2 (two) times daily as needed for cough.   carvedilol 12.5 MG tablet Commonly known as:  COREG TAKE 1 TABLET (12.5 MG TOTAL) BY MOUTH 2 (TWO) TIMES DAILY WITH A MEAL.   clopidogrel 75 MG tablet Commonly known as:  PLAVIX Take 75 mg by mouth daily.   ezetimibe 10 MG tablet Commonly known  as:  ZETIA Take 10 mg by mouth daily.   feeding supplement (ENSURE ENLIVE) Liqd Take 237 mLs by mouth 2 (two) times daily between meals.   furosemide 20 MG tablet Commonly known as:  LASIX Take 20 mg by mouth every Monday, Wednesday, and Friday.   guaiFENesin 600 MG 12 hr tablet Commonly known as:  MUCINEX Take 2 tablets (1,200 mg total) by mouth 2 (two) times daily.   ipratropium-albuterol 0.5-2.5 (3) MG/3ML Soln Commonly known as:  DUONEB Take 3 mLs by nebulization every 6 (six) hours.   LIVALO 2 MG Tabs Generic drug:  Pitavastatin Calcium Take 2 mg by mouth See admin instructions. Every 3rd day   losartan 50 MG tablet Commonly known as:  COZAAR Take 50 mg by mouth daily.   multivitamin with minerals Tabs tablet Take 1 tablet by mouth daily.   nitroGLYCERIN 0.4 MG SL tablet Commonly known as:  NITROSTAT Place 1 tablet (0.4 mg total) under the tongue every 5 (five) minutes as needed for chest pain.   olopatadine 0.1 % ophthalmic solution Commonly known as:  PATANOL Place 1 drop into the left eye 2 (two) times daily.   pantoprazole 40 MG tablet Commonly known as:  PROTONIX Take 40 mg by mouth daily.   [place] COLON HEALTH Caps Take 1 capsule by mouth daily.   potassium chloride 10 MEQ CR capsule Commonly known as:  MICRO-K Take 10 mEq by mouth every Monday, Wednesday, and Friday. **Take with lasix**   Tiotropium Bromide-Olodaterol 2.5-2.5 MCG/ACT Aers Inhale 2 puffs into the lungs daily.   TYLENOL PO Take 600 mg by mouth as needed (headache).   VITAMIN D PO Take 1 tablet by mouth daily.        There were no vitals  filed for this visit. There is no height or weight on file to calculate BMI.  Physical Exam  GENERAL APPEARANCE: Alert, conversant. No acute distress.  HEENT: Unremarkable. RESPIRATORY: Breathing is even, unlabored. Lung sounds are clear   CARDIOVASCULAR: Heart RRR no murmurs, rubs or gallops. No peripheral edema.  GASTROINTESTINAL: Abdomen is soft, non-tender, not distended w/ normal bowel sounds.  NEUROLOGIC: Cranial nerves 2-12 grossly intact. Moves all extremities   Labs reviewed: Basic Metabolic Panel: Recent Labs    06/15/18 1329 06/16/18 0347 06/19/18 0343 06/27/18  NA 138 138 137 137  K 3.9 3.2* 4.1 4.8  CL 106 108 107  --   CO2 25 23 23   --   GLUCOSE 122* 132* 117*  --   BUN 10 13 14 15   CREATININE 1.05 1.00 0.81 0.9  CALCIUM 8.8* 8.2* 8.7*  --    No results found for: [facility] Liver Function Tests: Recent Labs    06/15/18 1329 06/16/18 0347  AST 23 22  ALT 19 16  ALKPHOS 62 54  BILITOT 0.9 0.9  PROT 6.4* 5.1*  ALBUMIN 3.5 2.6*   No results for input(s): LIPASE, AMYLASE in the last 8760 hours. No results for input(s): AMMONIA in the last 8760 hours. CBC: Recent Labs    06/15/18 1329  06/17/18 0446 06/18/18 0556 06/19/18 0343 06/27/18  WBC 16.6*   < > 13.8* 11.9* 11.0* 11.5  NEUTROABS 14.3*  --   --   --   --   --   HGB 13.5   < > 11.7* 11.9* 11.7* 12.9*  HCT 41.5   < > 36.2* 35.8* 35.3* 38*  MCV 94.7   < > 93.5 92.5 91.9  --   PLT 279   < >  230 245 263 372   < > = values in this interval not displayed.   Lipid No results for input(s): CHOL, HDL, LDLCALC, TRIG in the last 8760 hours. Cardiac Enzymes: No results for input(s): CKTOTAL, CKMB, CKMBINDEX, TROPONINI in the last 8760 hours. BNP: Recent Labs    06/15/18 1329  BNP 141.4*   CBG: No results for input(s): GLUCAP in the last 8760 hours.  Procedures and Imaging Studies During Stay: Dg Chest 2 View  Result Date: 07/05/2018 CLINICAL DATA:  Pneumonia follow-up. EXAM: CHEST - 2 VIEW  COMPARISON:  Chest x-ray and CT chest dated June 15, 2018. FINDINGS: The heart size and mediastinal contours are within normal limits. Normal pulmonary vascularity. Stable emphysematous changes. Unchanged scarring at the left lung base. No focal consolidation, pleural effusion, or pneumothorax. No acute osseous abnormality. IMPRESSION: 1.  No active cardiopulmonary disease. 2.  Emphysema (ICD10-J43.9). Electronically Signed   By: Titus Dubin M.D.   On: 07/05/2018 14:53   Ct Angio Chest Pe W And/or Wo Contrast  Result Date: 06/15/2018 CLINICAL DATA:  Cough, chest pain EXAM: CT ANGIOGRAPHY CHEST WITH CONTRAST TECHNIQUE: Multidetector CT imaging of the chest was performed using the standard protocol during bolus administration of intravenous contrast. Multiplanar CT image reconstructions and MIPs were obtained to evaluate the vascular anatomy. CONTRAST:  132mL ISOVUE-370 IOPAMIDOL (ISOVUE-370) INJECTION 76% COMPARISON:  CXR 06/15/2018 FINDINGS: Cardiovascular: Satisfactory opacification of the pulmonary arteries to the segmental level. Small segmental and subsegmental right lower lobe pulmonary emboli. Moderate aortic atherosclerosis. No aneurysm. Coronary vascular calcification. Borderline cardiomegaly. No pericardial effusion Mediastinum/Nodes: Midline trachea. No thyroid mass. Esophagus within normal limits. No significant adenopathy. Moderate hiatal hernia Lungs/Pleura: Moderate severe emphysema. Ground-glass density and consolidation in the posterior right upper lobe consistent with a pneumonia. Upper Abdomen: No acute abnormality. Musculoskeletal: No acute or suspicious abnormality. Degenerative change Review of the MIP images confirms the above findings. IMPRESSION: 1. Small segmental and subsegmental right lower lobe pulmonary emboli. 2. Moderate to severe emphysema. Consolidation and ground-glass density in the posterior right upper lobe consistent with a pneumonia Critical Value/emergent results  were called by telephone at the time of interpretation on 06/15/2018 at 6:12 pm to [facility] in the ED , who verbally acknowledged these results. Aortic Atherosclerosis (ICD10-I70.0) and Emphysema (ICD10-J43.9). Electronically Signed   By: Donavan Foil M.D.   On: 06/15/2018 18:13   Dg Chest Port 1 View  Result Date: 06/15/2018 CLINICAL DATA:  Respiratory distress. EXAM: PORTABLE CHEST 1 VIEW COMPARISON:  05/07/2018 and 07/28/2017 FINDINGS: Heart size and pulmonary vascularity are within normal limits. Lungs are clear except for slight scarring at the left lung base. No effusions. No acute bone abnormality. IMPRESSION: No acute abnormality. Aortic Atherosclerosis (ICD10-I70.0). Electronically Signed   By: Lorriane Shire M.D.   On: 06/15/2018 13:18    Assessment/Plan:   No diagnosis found.   Patient is being discharged with the following home health services:    Patient is being discharged with the following durable medical equipment:    Patient has been advised to f/u with their PCP in 1-2 weeks to bring them up to date on their rehab stay.  Social services at facility was responsible for arranging this appointment.  Pt was provided with a 30 day supply of prescriptions for medications and refills must be obtained from their PCP.  For controlled substances, a more limited supply may be provided adequate until PCP appointment only.  Future labs/tests needed:   Inocencio Homes, MD  This encounter  was created in error - please disregard.

## 2018-07-14 ENCOUNTER — Encounter: Payer: Self-pay | Admitting: Internal Medicine

## 2018-08-02 ENCOUNTER — Other Ambulatory Visit: Payer: Self-pay | Admitting: Internal Medicine

## 2018-08-02 ENCOUNTER — Ambulatory Visit: Payer: Medicare Other | Admitting: Adult Health

## 2018-08-02 ENCOUNTER — Encounter: Payer: Self-pay | Admitting: Adult Health

## 2018-08-02 VITALS — BP 124/60 | HR 80 | Ht 68.0 in | Wt 205.0 lb

## 2018-08-02 DIAGNOSIS — J449 Chronic obstructive pulmonary disease, unspecified: Secondary | ICD-10-CM | POA: Diagnosis not present

## 2018-08-02 DIAGNOSIS — I5032 Chronic diastolic (congestive) heart failure: Secondary | ICD-10-CM | POA: Diagnosis not present

## 2018-08-02 DIAGNOSIS — I43 Cardiomyopathy in diseases classified elsewhere: Secondary | ICD-10-CM | POA: Diagnosis not present

## 2018-08-02 NOTE — Progress Notes (Signed)
Cardiology Office Note   Date:  08/02/2018   ID:  Lee [name], DOB July 14, 1931, MRN [id number]  PCP:  [name], MD  Cardiologist: [place]  Chief Complaint  Patient presents with  . Follow-up     History of Present Illness: Lee [name] is a 84 y.o. male who presents for ongoing assessment and management of NICM, EF of 56%-43, chronic diastolic CHF, chronic LBBB, AAA, s/p surgical repair followed by Dr. Donnetta Hutching, hypertension, HLD, hx of CVA, O2 dependent COPD, pulmonary fibrosis.   NM stress test and echocardiogram was completed. Echo demonstrated poor acoustic windows, with normal LVEF, and mild LVH. The stress test was negative for ischemia and found to be low risk.   He was last seen in the office on 07/05/2018 after admission for aspiration pneumonia. A repeat of CXR was ordered on last visit. This revealed no active cardiopulmonary disease emphysema.   He is doing well today and is without complaints.   Past Medical History:  Diagnosis Date  . Allergic rhinitis   . Anxiety   . Basal cell carcinoma of face   . Cardiomyopathy ([place]) 04/2015   EF 40-45% by echo. Apical anterior akinesis seen on echo, not confirmed on Myoview was negative for ischemia. Myoview suggested a small basal inferoseptal defect.  . Cholecystitis    a. complex admit 04/2015 due to sepsis due to acute Escherichia coli cholecystitis and right lower lobe CAP and Citrobacter UTI.  [name] Chronic respiratory failure ([place])    a. on home O2 after discharge 04/2015.  [name] Chronic systolic CHF (congestive heart failure) ([place])    a. Dx 04/2015 during complex admission for cholecystitis - EF 40-45% +WMA.  [name] COPD (chronic obstructive pulmonary disease) ([place])   . Erectile dysfunction   . History of endovascular stent graft for abdominal aortic aneurysm 2002   a. s/p repair  Followed by Dr. Donnetta Hutching (stable evaluation 01/2106)  . History of hiatal hernia   . HTN (hypertension)   . Hyperlipidemia   . Interstitial lung  disease ([place])    a. followed by pulm.  [name] LBBB (left bundle branch block)    a. Intermittent - seen in 2013, not in 09/2013, but again in 04/2015.  . Obesity   . On home oxygen therapy    "just at night" (10/05/2015)  . PVD (peripheral vascular disease) ([place])   . Shingles   . Stroke [facility])    a. H/o aphasia - was told he'd had ministrokes. Imaging 09/2014 revealed prior infarct.    Past Surgical History:  Procedure Laterality Date  . AORTA SURGERY  2002  . CHOLECYSTECTOMY N/A 10/05/2015   Procedure: LAPAROSCOPIC CHOLECYSTECTOMY  ;  Surgeon: Autumn Messing III, MD;  Location: [place];  Service: General;  Laterality: N/A;  . [place]  . INTRAOPERATIVE CHOLANGIOGRAM  10/05/2015   Procedure: INTRAOPERATIVE CHOLANGIOGRAM;  Surgeon: Autumn Messing III, MD;  Location: [place];  Service: General;;  . LAPAROSCOPIC CHOLECYSTECTOMY  10/05/2015  . NM MYOVIEW LTD  05/2015   EF 49%. Small size, mild severity perfusion defect in the basal-mid inferoseptal wall. LOW RISK. No ischemia. Defect thought to be related to LBBB artifacts and not true lesion. This could also explain mild systolic dysfunction.  . TRANSTHORACIC ECHOCARDIOGRAM  04/2015   (In setting of acute illness).EF 40-45% with mid-apical anteroseptal akinesis. Moderately increased PA pressures of roughly 48 mmHg.  [name] URETHRAL STRICTURE DILATATION  late 1970s   urinary tract stretch     Current  Outpatient Medications  Medication Sig Dispense Refill  . albuterol (PROVENTIL) (2.5 MG/3ML) 0.083% nebulizer solution Take 3 mLs (2.5 mg total) by nebulization every 6 (six) hours as needed for wheezing or shortness of breath. 360 mL 12  . apixaban (ELIQUIS) 5 MG TABS tablet Take 1 tablet (5 mg total) by mouth 2 (two) times daily. 60 tablet 0  . carvedilol (COREG) 12.5 MG tablet TAKE 1 TABLET (12.5 MG TOTAL) BY MOUTH 2 (TWO) TIMES DAILY WITH A MEAL. 180 tablet 3  . cholecalciferol (VITAMIN D) 1000 units tablet Take 1 tablet (1,000 Units total) by mouth  daily. 30 tablet 0  . clopidogrel (PLAVIX) 75 MG tablet Take 1 tablet (75 mg total) by mouth daily. 30 tablet 5  . ezetimibe (ZETIA) 10 MG tablet Take 1 tablet (10 mg total) by mouth daily. 30 tablet 0  . feeding supplement, ENSURE ENLIVE, (ENSURE ENLIVE) LIQD Take 237 mLs by mouth 2 (two) times daily between meals. 237 mL 12  . furosemide (LASIX) 20 MG tablet Take 1 tablet (20 mg total) by mouth every Monday, Wednesday, and Friday. 30 tablet 6  . guaiFENesin (MUCINEX) 600 MG 12 hr tablet Take 2 tablets (1,200 mg total) by mouth 2 (two) times daily. 20 tablet 0  . ipratropium-albuterol (DUONEB) 0.5-2.5 (3) MG/3ML SOLN Take 3 mLs by nebulization every 6 (six) hours. 360 mL 0  . losartan (COZAAR) 50 MG tablet Take 1 tablet (50 mg total) by mouth daily. 30 tablet 0  . Multiple Vitamin (MULTIVITAMIN WITH MINERALS) TABS tablet Take 1 tablet by mouth daily. 30 tablet 0  . nitroGLYCERIN (NITROSTAT) 0.4 MG SL tablet Place 1 tablet (0.4 mg total) under the tongue every 5 (five) minutes as needed for chest pain. 30 tablet 0  . olopatadine (PATANOL) 0.1 % ophthalmic solution Place 1 drop into the left eye 2 (two) times daily. 5 mL 0  . pantoprazole (PROTONIX) 40 MG tablet Take 1 tablet (40 mg total) by mouth daily. 30 tablet 10  . Pitavastatin Calcium (LIVALO) 2 MG TABS Take 1 tablet (2 mg total) by mouth See admin instructions. Every 3rd day 30 tablet 0  . potassium chloride (MICRO-K) 10 MEQ CR capsule Take 1 capsule (10 mEq total) by mouth every Monday, Wednesday, and Friday. **Take with lasix** 14 capsule 5  . Probiotic Product ([place]) CAPS Take 1 capsule by mouth daily. 30 capsule 0  . Tiotropium Bromide-Olodaterol (STIOLTO RESPIMAT) 2.5-2.5 MCG/ACT AERS Inhale 2 puffs into the lungs daily. 1 Inhaler 0  . albuterol (PROVENTIL HFA;VENTOLIN HFA) 108 (90 Base) MCG/ACT inhaler Inhale 2 puffs into the lungs every 6 (six) hours as needed for wheezing or shortness of breath. 1 Inhaler 0   No  current facility-administered medications for this visit.     Allergies:   Ciprofloxacin and Tape    Social History:  The patient  reports that he quit smoking about 43 years ago. His smoking use included cigarettes. He has a 25.00 pack-year smoking history. He has never used smokeless tobacco. He reports that he does not drink alcohol or use drugs.   Family History:  The patient's family history includes Colon cancer in his brother; Heart disease in his father; Hypertension in his mother; Stroke in his father.    ROS: All other systems are reviewed and negative. Unless otherwise mentioned in H&P    PHYSICAL EXAM: VS:  BP 124/60   Pulse 80   Ht 5\' 8"  (1.727 m)   Wt 205 lb (  93 kg)   BMI 31.17 kg/m  , BMI Body mass index is 31.17 kg/m. GEN: Well nourished, well developed, in no acute distress HEENT: normal Neck: no JVD, carotid bruits, or masses Cardiac: RRR; soft systolic murmurs, rubs, or gallops,no edema  Respiratory:  Clear to auscultation bilaterally, normal work of breathing GI: soft, nontender, nondistended, + BS MS: no deformity or atrophy Skin: warm and dry, no rash Neuro:  Strength and sensation are intact, some dysphasia  Psych: euthymic mood, full affect,    EKG: Not completed .  Recent Labs: 06/15/2018: B Natriuretic Peptide 141.4 06/16/2018: ALT 16 06/27/2018: BUN 15; Creatinine 0.9; Hemoglobin 12.9; Platelets 372; Potassium 4.8; Sodium 137    Lipid Panel No results found for: CHOL, TRIG, HDL, CHOLHDL, VLDL, LDLCALC, LDLDIRECT    Wt Readings from Last 3 Encounters:  08/02/18 205 lb (93 kg)  07/13/18 202 lb (91.6 kg)  07/05/18 202 lb 12.8 oz (92 kg)      Other studies Reviewed: Left ventricle: POor acoustic windows limit study, even with use of Definity. Overall LVEF appears normal. The cavity size was normal. Wall thickness was increased in a pattern of mild LVH.  ASSESSMENT AND PLAN:  1. Chronic Dyspnea: Patient has emphysema per CXR. He also is  being seen by pulmonology. Will defer to them for further recommendations.   2. NICM: EF of 45% per most recent echo. Continue current regimen. Most recent stress trst was negative for ischemia  3. Chronic diastolic CHF: No evidence of volume overload.   Current medicines are reviewed at length with the patient today.    Labs/ tests ordered today include: None  [place]ill Myron. [place] Pugh, ANP, AACC   08/02/2018 1:21 PM    [facility] [place] [address] Office [phone] Fax [phone]

## 2018-08-02 NOTE — Patient Instructions (Addendum)
Follow-Up: You will need a follow up appointment in 6 months.  Please call our office 2 months in advance to schedule this appointment.  You may see DR Ellyn Hack or one of the following Advanced Practice Providers on your designated Care Team:  Rosaria Ferries, PA-C [place], DNP AACC   Medication Instructions:  NO CHANGES- Your physician recommends that you continue on your current medications as directed. Please refer to the Current Medication list given to you today. If you need a refill on your cardiac medications before your next appointment, please call your pharmacy.  Labwork: When you have labs (blood work) and your tests are completely normal, you will receive your results ONLY by [place] (if you have MyChart) -OR- A paper copy in the mail.  At [facility], you and your health needs are our priority.  As part of our continuing mission to provide you with exceptional heart care, we have created designated Provider Care Teams.  These Care Teams include your primary Cardiologist (physician) and Advanced Practice Providers (APPs -  Physician Assistants and Nurse Practitioners) who all work together to provide you with the care you need, when you need it.  Thank you for choosing [facility] at [facility]!!

## 2018-08-03 ENCOUNTER — Other Ambulatory Visit (HOSPITAL_COMMUNITY): Payer: Self-pay

## 2018-08-05 ENCOUNTER — Other Ambulatory Visit: Payer: Self-pay | Admitting: Internal Medicine

## 2018-08-06 ENCOUNTER — Ambulatory Visit (HOSPITAL_COMMUNITY)
Admission: RE | Admit: 2018-08-06 | Discharge: 2018-08-06 | Disposition: A | Discharge status: Home / Self Care | Payer: Medicare Other | Source: Ambulatory Visit | Attending: Internal Medicine | Admitting: Internal Medicine

## 2018-08-06 DIAGNOSIS — M810 Age-related osteoporosis without current pathological fracture: Secondary | ICD-10-CM | POA: Diagnosis not present

## 2018-08-06 MED ORDER — DENOSUMAB 60 MG/ML ~~LOC~~ SOSY
PREFILLED_SYRINGE | SUBCUTANEOUS | Status: AC
  Administered 2018-08-06: 60 mg via SUBCUTANEOUS
  Filled 2018-08-06: qty 1

## 2018-08-06 MED ORDER — DENOSUMAB 60 MG/ML ~~LOC~~ SOSY
60.0000 mg | PREFILLED_SYRINGE | Freq: Once | SUBCUTANEOUS | Status: AC
  Administered 2018-08-06: 60 mg via SUBCUTANEOUS

## 2018-08-06 NOTE — Discharge Instructions (Signed)
Denosumab injection °What is this medicine? °DENOSUMAB (den oh sue mab) slows bone breakdown. Prolia is used to treat osteoporosis in women after menopause and in men. Xgeva is used to treat a high calcium level due to cancer and to prevent bone fractures and other bone problems caused by multiple myeloma or cancer bone metastases. Xgeva is also used to treat giant cell tumor of the bone. °This medicine may be used for other purposes; ask your health care provider or pharmacist if you have questions. °COMMON BRAND NAME(S): Prolia, XGEVA °What should I tell my health care provider before I take this medicine? °They need to know if you have any of these conditions: °-dental disease °-having surgery or tooth extraction °-infection °-kidney disease °-low levels of calcium or Vitamin D in the blood °-malnutrition °-on hemodialysis °-skin conditions or sensitivity °-thyroid or parathyroid disease °-an unusual reaction to denosumab, other medicines, foods, dyes, or preservatives °-pregnant or trying to get pregnant °-breast-feeding °How should I use this medicine? °This medicine is for injection under the skin. It is given by a health care professional in a hospital or clinic setting. °If you are getting Prolia, a special MedGuide will be given to you by the pharmacist with each prescription and refill. Be sure to read this information carefully each time. °For Prolia, talk to your pediatrician regarding the use of this medicine in children. Special care may be needed. For Xgeva, talk to your pediatrician regarding the use of this medicine in children. While this drug may be prescribed for children as young as 13 years for selected conditions, precautions do apply. °Overdosage: If you think you have taken too much of this medicine contact a poison control center or emergency room at once. °NOTE: This medicine is only for you. Do not share this medicine with others. °What if I miss a dose? °It is important not to miss your  dose. Call your doctor or health care professional if you are unable to keep an appointment. °What may interact with this medicine? °Do not take this medicine with any of the following medications: °-other medicines containing denosumab °This medicine may also interact with the following medications: °-medicines that lower your chance of fighting infection °-steroid medicines like prednisone or cortisone °This list may not describe all possible interactions. Give your health care provider a list of all the medicines, herbs, non-prescription drugs, or dietary supplements you use. Also tell them if you smoke, drink alcohol, or use illegal drugs. Some items may interact with your medicine. °What should I watch for while using this medicine? °Visit your doctor or health care professional for regular checks on your progress. Your doctor or health care professional may order blood tests and other tests to see how you are doing. °Call your doctor or health care professional for advice if you get a fever, chills or sore throat, or other symptoms of a cold or flu. Do not treat yourself. This drug may decrease your body's ability to fight infection. Try to avoid being around people who are sick. °You should make sure you get enough calcium and vitamin D while you are taking this medicine, unless your doctor tells you not to. Discuss the foods you eat and the vitamins you take with your health care professional. °See your dentist regularly. Brush and floss your teeth as directed. Before you have any dental work done, tell your dentist you are receiving this medicine. °Do not become pregnant while taking this medicine or for 5 months after stopping   it. Talk with your doctor or health care professional about your birth control options while taking this medicine. Women should inform their doctor if they wish to become pregnant or think they might be pregnant. There is a potential for serious side effects to an unborn child. Talk  to your health care professional or pharmacist for more information. What side effects may I notice from receiving this medicine? Side effects that you should report to your doctor or health care professional as soon as possible: -allergic reactions like skin rash, itching or hives, swelling of the face, lips, or tongue -bone pain -breathing problems -dizziness -jaw pain, especially after dental work -redness, blistering, peeling of the skin -signs and symptoms of infection like fever or chills; cough; sore throat; pain or trouble passing urine -signs of low calcium like fast heartbeat, muscle cramps or muscle pain; pain, tingling, numbness in the hands or feet; seizures -unusual bleeding or bruising -unusually weak or tired Side effects that usually do not require medical attention (report to your doctor or health care professional if they continue or are bothersome): -constipation -diarrhea -headache -joint pain -loss of appetite -muscle pain -runny nose -tiredness -upset stomach This list may not describe all possible side effects. Call your doctor for medical advice about side effects. You may report side effects to FDA at 1-800-FDA-1088. Where should I keep my medicine? This medicine is only given in a clinic, doctor's office, or other health care setting and will not be stored at home. NOTE: This sheet is a summary. It may not cover all possible information. If you have questions about this medicine, talk to your doctor, pharmacist, or health care provider.  2018 Elsevier/Gold Standard (2016-08-30 19:17:21)

## 2018-08-07 ENCOUNTER — Other Ambulatory Visit: Payer: Self-pay | Admitting: Internal Medicine

## 2018-08-13 ENCOUNTER — Emergency Department (HOSPITAL_COMMUNITY): Payer: Medicare Other

## 2018-08-13 ENCOUNTER — Inpatient Hospital Stay (HOSPITAL_COMMUNITY)
Admission: EM | Admit: 2018-08-13 | Discharge: 2018-08-16 | DRG: 191 | Disposition: A | Discharge status: Home Health | Payer: Medicare Other | Attending: Internal Medicine | Admitting: Internal Medicine

## 2018-08-13 ENCOUNTER — Other Ambulatory Visit: Payer: Self-pay

## 2018-08-13 ENCOUNTER — Encounter (HOSPITAL_COMMUNITY): Payer: Self-pay | Admitting: Emergency Medicine

## 2018-08-13 DIAGNOSIS — Z7902 Long term (current) use of antithrombotics/antiplatelets: Secondary | ICD-10-CM

## 2018-08-13 DIAGNOSIS — K219 Gastro-esophageal reflux disease without esophagitis: Secondary | ICD-10-CM | POA: Diagnosis present

## 2018-08-13 DIAGNOSIS — Z87891 Personal history of nicotine dependence: Secondary | ICD-10-CM

## 2018-08-13 DIAGNOSIS — Z881 Allergy status to other antibiotic agents status: Secondary | ICD-10-CM

## 2018-08-13 DIAGNOSIS — I447 Left bundle-branch block, unspecified: Secondary | ICD-10-CM | POA: Diagnosis present

## 2018-08-13 DIAGNOSIS — Z9981 Dependence on supplemental oxygen: Secondary | ICD-10-CM

## 2018-08-13 DIAGNOSIS — Z823 Family history of stroke: Secondary | ICD-10-CM

## 2018-08-13 DIAGNOSIS — I739 Peripheral vascular disease, unspecified: Secondary | ICD-10-CM | POA: Diagnosis present

## 2018-08-13 DIAGNOSIS — Z7901 Long term (current) use of anticoagulants: Secondary | ICD-10-CM

## 2018-08-13 DIAGNOSIS — Z85828 Personal history of other malignant neoplasm of skin: Secondary | ICD-10-CM

## 2018-08-13 DIAGNOSIS — R5381 Other malaise: Secondary | ICD-10-CM

## 2018-08-13 DIAGNOSIS — J441 Chronic obstructive pulmonary disease with (acute) exacerbation: Secondary | ICD-10-CM | POA: Diagnosis not present

## 2018-08-13 DIAGNOSIS — Z8679 Personal history of other diseases of the circulatory system: Secondary | ICD-10-CM

## 2018-08-13 DIAGNOSIS — E669 Obesity, unspecified: Secondary | ICD-10-CM | POA: Diagnosis present

## 2018-08-13 DIAGNOSIS — I2699 Other pulmonary embolism without acute cor pulmonale: Secondary | ICD-10-CM | POA: Diagnosis present

## 2018-08-13 DIAGNOSIS — I5042 Chronic combined systolic (congestive) and diastolic (congestive) heart failure: Secondary | ICD-10-CM | POA: Diagnosis present

## 2018-08-13 DIAGNOSIS — N529 Male erectile dysfunction, unspecified: Secondary | ICD-10-CM | POA: Diagnosis present

## 2018-08-13 DIAGNOSIS — Z6832 Body mass index (BMI) 32.0-32.9, adult: Secondary | ICD-10-CM

## 2018-08-13 DIAGNOSIS — Z8619 Personal history of other infectious and parasitic diseases: Secondary | ICD-10-CM

## 2018-08-13 DIAGNOSIS — I639 Cerebral infarction, unspecified: Secondary | ICD-10-CM | POA: Diagnosis present

## 2018-08-13 DIAGNOSIS — I110 Hypertensive heart disease with heart failure: Secondary | ICD-10-CM | POA: Diagnosis present

## 2018-08-13 DIAGNOSIS — I10 Essential (primary) hypertension: Secondary | ICD-10-CM | POA: Diagnosis present

## 2018-08-13 DIAGNOSIS — F0390 Unspecified dementia without behavioral disturbance: Secondary | ICD-10-CM | POA: Diagnosis present

## 2018-08-13 DIAGNOSIS — I5022 Chronic systolic (congestive) heart failure: Secondary | ICD-10-CM

## 2018-08-13 DIAGNOSIS — Z9109 Other allergy status, other than to drugs and biological substances: Secondary | ICD-10-CM

## 2018-08-13 DIAGNOSIS — E785 Hyperlipidemia, unspecified: Secondary | ICD-10-CM | POA: Diagnosis present

## 2018-08-13 DIAGNOSIS — Z8673 Personal history of transient ischemic attack (TIA), and cerebral infarction without residual deficits: Secondary | ICD-10-CM

## 2018-08-13 DIAGNOSIS — Z79899 Other long term (current) drug therapy: Secondary | ICD-10-CM

## 2018-08-13 DIAGNOSIS — R0602 Shortness of breath: Secondary | ICD-10-CM | POA: Diagnosis not present

## 2018-08-13 DIAGNOSIS — Z8249 Family history of ischemic heart disease and other diseases of the circulatory system: Secondary | ICD-10-CM

## 2018-08-13 DIAGNOSIS — J9611 Chronic respiratory failure with hypoxia: Secondary | ICD-10-CM | POA: Diagnosis present

## 2018-08-13 DIAGNOSIS — Z86711 Personal history of pulmonary embolism: Secondary | ICD-10-CM

## 2018-08-13 DIAGNOSIS — I429 Cardiomyopathy, unspecified: Secondary | ICD-10-CM | POA: Diagnosis present

## 2018-08-13 LAB — CBC WITH DIFFERENTIAL/PLATELET
Abs Immature Granulocytes: 0.06 10*3/uL (ref 0.00–0.07)
Basophils Absolute: 0.1 10*3/uL (ref 0.0–0.1)
Basophils Relative: 0 %
EOS PCT: 4 %
Eosinophils Absolute: 0.6 10*3/uL — ABNORMAL HIGH (ref 0.0–0.5)
HCT: 43.9 % (ref 39.0–52.0)
Hemoglobin: 13.9 g/dL (ref 13.0–17.0)
Immature Granulocytes: 0 %
LYMPHS PCT: 16 %
Lymphs Abs: 2.4 10*3/uL (ref 0.7–4.0)
MCH: 30.2 pg (ref 26.0–34.0)
MCHC: 31.7 g/dL (ref 30.0–36.0)
MCV: 95.4 fL (ref 80.0–100.0)
Monocytes Absolute: 1.2 10*3/uL — ABNORMAL HIGH (ref 0.1–1.0)
Monocytes Relative: 8 %
Neutro Abs: 10.3 10*3/uL — ABNORMAL HIGH (ref 1.7–7.7)
Neutrophils Relative %: 72 %
Platelets: 275 10*3/uL (ref 150–400)
RBC: 4.6 MIL/uL (ref 4.22–5.81)
RDW: 14.1 % (ref 11.5–15.5)
WBC: 14.7 10*3/uL — AB (ref 4.0–10.5)
nRBC: 0 % (ref 0.0–0.2)

## 2018-08-13 LAB — I-STAT TROPONIN, ED: Troponin i, poc: 0 ng/mL (ref 0.00–0.08)

## 2018-08-13 LAB — MRSA PCR SCREENING: MRSA by PCR: NEGATIVE

## 2018-08-13 LAB — BASIC METABOLIC PANEL
Anion gap: 12 (ref 5–15)
BUN: 9 mg/dL (ref 8–23)
CO2: 20 mmol/L — ABNORMAL LOW (ref 22–32)
Calcium: 8.8 mg/dL — ABNORMAL LOW (ref 8.9–10.3)
Chloride: 104 mmol/L (ref 98–111)
Creatinine, Ser: 0.9 mg/dL (ref 0.61–1.24)
GFR calc Af Amer: >60 mL/min (ref >60)
GFR calc non Af Amer: >60 mL/min (ref >60)
Glucose, Bld: 104 mg/dL — ABNORMAL HIGH (ref 70–99)
Potassium: 4.4 mmol/L (ref 3.5–5.1)
Sodium: 136 mmol/L (ref 135–145)

## 2018-08-13 LAB — INFLUENZA PANEL BY PCR (TYPE A & B)
Influenza A By PCR: NEGATIVE
Influenza B By PCR: NEGATIVE

## 2018-08-13 LAB — BRAIN NATRIURETIC PEPTIDE: B Natriuretic Peptide: 99.8 pg/mL (ref 0.0–100.0)

## 2018-08-13 MED ORDER — OLOPATADINE HCL 0.1 % OP SOLN
1.0000 [drp] | Freq: Every day | OPHTHALMIC | Status: DC
  Administered 2018-08-14 – 2018-08-15 (×2): 1 [drp] via OPHTHALMIC
  Filled 2018-08-13: qty 5

## 2018-08-13 MED ORDER — AZITHROMYCIN 250 MG PO TABS
500.0000 mg | ORAL_TABLET | Freq: Once | ORAL | Status: AC
  Administered 2018-08-13: 500 mg via ORAL
  Filled 2018-08-13: qty 2

## 2018-08-13 MED ORDER — VITAMIN D 25 MCG (1000 UNIT) PO TABS
1000.0000 [IU] | ORAL_TABLET | Freq: Every day | ORAL | Status: DC
  Administered 2018-08-14 – 2018-08-15 (×2): 1000 [IU] via ORAL
  Filled 2018-08-13 (×2): qty 1

## 2018-08-13 MED ORDER — CARVEDILOL 12.5 MG PO TABS
12.5000 mg | ORAL_TABLET | Freq: Two times a day (BID) | ORAL | Status: DC
  Administered 2018-08-13 – 2018-08-16 (×6): 12.5 mg via ORAL
  Filled 2018-08-13 (×6): qty 1

## 2018-08-13 MED ORDER — DM-GUAIFENESIN ER 30-600 MG PO TB12
1.0000 | ORAL_TABLET | Freq: Two times a day (BID) | ORAL | Status: DC
  Administered 2018-08-13 – 2018-08-14 (×3): 1 via ORAL
  Filled 2018-08-13 (×3): qty 1

## 2018-08-13 MED ORDER — IPRATROPIUM-ALBUTEROL 0.5-2.5 (3) MG/3ML IN SOLN
3.0000 mL | Freq: Once | RESPIRATORY_TRACT | Status: AC
  Administered 2018-08-13: 3 mL via RESPIRATORY_TRACT
  Filled 2018-08-13: qty 3

## 2018-08-13 MED ORDER — ADULT MULTIVITAMIN W/MINERALS CH
1.0000 | ORAL_TABLET | Freq: Every day | ORAL | Status: DC
  Administered 2018-08-13 – 2018-08-16 (×4): 1 via ORAL
  Filled 2018-08-13 (×4): qty 1

## 2018-08-13 MED ORDER — PANTOPRAZOLE SODIUM 40 MG PO TBEC
40.0000 mg | DELAYED_RELEASE_TABLET | Freq: Every day | ORAL | Status: DC
  Administered 2018-08-14 – 2018-08-16 (×3): 40 mg via ORAL
  Filled 2018-08-13 (×3): qty 1

## 2018-08-13 MED ORDER — EZETIMIBE 10 MG PO TABS
10.0000 mg | ORAL_TABLET | Freq: Every day | ORAL | Status: DC
  Administered 2018-08-14 – 2018-08-16 (×3): 10 mg via ORAL
  Filled 2018-08-13 (×3): qty 1

## 2018-08-13 MED ORDER — PREDNISONE 20 MG PO TABS: 40.0000 mg | ORAL_TABLET | Freq: Every day | ORAL | Status: DC

## 2018-08-13 MED ORDER — IPRATROPIUM-ALBUTEROL 0.5-2.5 (3) MG/3ML IN SOLN
3.0000 mL | Freq: Four times a day (QID) | RESPIRATORY_TRACT | Status: DC
  Administered 2018-08-13 – 2018-08-14 (×3): 3 mL via RESPIRATORY_TRACT
  Filled 2018-08-13 (×2): qty 3

## 2018-08-13 MED ORDER — APIXABAN 5 MG PO TABS
5.0000 mg | ORAL_TABLET | Freq: Two times a day (BID) | ORAL | Status: DC
  Administered 2018-08-13 – 2018-08-16 (×6): 5 mg via ORAL
  Filled 2018-08-13 (×6): qty 1

## 2018-08-13 MED ORDER — AZITHROMYCIN 250 MG PO TABS
250.0000 mg | ORAL_TABLET | Freq: Every day | ORAL | Status: DC
  Administered 2018-08-14 – 2018-08-15 (×2): 250 mg via ORAL
  Filled 2018-08-13 (×2): qty 1

## 2018-08-13 MED ORDER — ALBUTEROL SULFATE (2.5 MG/3ML) 0.083% IN NEBU: 2.5000 mg | INHALATION_SOLUTION | RESPIRATORY_TRACT | Status: DC | PRN

## 2018-08-13 MED ORDER — PRAVASTATIN SODIUM 10 MG PO TABS
20.0000 mg | ORAL_TABLET | Freq: Every day | ORAL | Status: DC
  Administered 2018-08-14 – 2018-08-15 (×2): 20 mg via ORAL
  Filled 2018-08-13 (×2): qty 2

## 2018-08-13 MED ORDER — ALBUTEROL SULFATE (2.5 MG/3ML) 0.083% IN NEBU
5.0000 mg | INHALATION_SOLUTION | Freq: Once | RESPIRATORY_TRACT | Status: AC
  Administered 2018-08-13: 5 mg via RESPIRATORY_TRACT
  Filled 2018-08-13: qty 6

## 2018-08-13 MED ORDER — CLOPIDOGREL BISULFATE 75 MG PO TABS
75.0000 mg | ORAL_TABLET | Freq: Every day | ORAL | Status: DC
  Administered 2018-08-13 – 2018-08-16 (×4): 75 mg via ORAL
  Filled 2018-08-13 (×4): qty 1

## 2018-08-13 MED ORDER — ENSURE ENLIVE PO LIQD
237.0000 mL | Freq: Every day | ORAL | Status: DC
  Administered 2018-08-13 – 2018-08-16 (×4): 237 mL via ORAL

## 2018-08-13 MED ORDER — METHYLPREDNISOLONE SODIUM SUCC 125 MG IJ SOLR
125.0000 mg | Freq: Once | INTRAMUSCULAR | Status: AC
  Administered 2018-08-13: 125 mg via INTRAVENOUS
  Filled 2018-08-13: qty 2

## 2018-08-13 NOTE — ED Provider Notes (Signed)
[facility] EMERGENCY DEPARTMENT Provider Note   CSN: [id number] Arrival date & time: 08/13/18  1238     History   Chief Complaint Chief Complaint  Patient presents with  . Cough  . Shortness of Breath    HPI Lee STEN [name] is a 84 y.o. male.  Patient is a 84 year old male with a history of COPD, CHF, AAA status post repair, pulmonary fibrosis who presents with shortness of breath.  He had a recent admission in October and was found to have bilateral subsegmental PEs and is currently on Eliquis.  His wife states that he started having a runny nose yesterday and called bad coughing spells.  This is continued today.  She thought he felt warm earlier today but did not check his temperature.  He got a dose of Tylenol at that point.  He has had no vomiting.  No increased leg swelling.  No reports of chest pain.  He is slightly more confused today and that he does not answer questions as fast as he normally does.  He is on home oxygen only at night or when he sleeping during the day.  This would be at 2 L/min.  His wife is given him some nebulizer treatments this morning without improvement in symptoms.     Past Medical History:  Diagnosis Date  . Allergic rhinitis   . Anxiety   . Basal cell carcinoma of face   . Cardiomyopathy ([place]) 04/2015   EF 40-45% by echo. Apical anterior akinesis seen on echo, not confirmed on Myoview was negative for ischemia. Myoview suggested a small basal inferoseptal defect.  . Cholecystitis    a. complex admit 04/2015 due to sepsis due to acute Escherichia coli cholecystitis and right lower lobe CAP and Citrobacter UTI.  [name] Chronic respiratory failure ([place])    a. on home O2 after discharge 04/2015.  [name] Chronic systolic CHF (congestive heart failure) ([place])    a. Dx 04/2015 during complex admission for cholecystitis - EF 40-45% +WMA.  [name] COPD (chronic obstructive pulmonary disease) ([place])   . Erectile dysfunction   . History of endovascular  stent graft for abdominal aortic aneurysm 2002   a. s/p repair  Followed by Dr. Donnetta Hutching (stable evaluation 01/2106)  . History of hiatal hernia   . HTN (hypertension)   . Hyperlipidemia   . Interstitial lung disease ([place])    a. followed by pulm.  [name] LBBB (left bundle branch block)    a. Intermittent - seen in 2013, not in 09/2013, but again in 04/2015.  . Obesity   . On home oxygen therapy    "just at night" (10/05/2015)  . PVD (peripheral vascular disease) ([place])   . Shingles   . Stroke [facility])    a. H/o aphasia - was told he'd had ministrokes. Imaging 09/2014 revealed prior infarct.    Patient Active Problem List   Diagnosis Date Noted  . History of recent pneumonia 07/10/2018  . Chronic diastolic (congestive) heart failure ([place]) 06/23/2018  . Pulmonary emboli ([place]) 06/15/2018  . Aspiration pneumonia ([place]) 06/15/2018  . Altered mental status 06/15/2018  . Chest pain 05/30/2017  . Stroke ([place])   . UTI (urinary tract infection) 05/02/2015  . Cardiomyopathy, nonischemic ([facility]) - EF 40-45% by echo, but nearly 50% by Myoview. Anterior akinesis seen on echo not seen on Myoview. 05/02/2015  . Sepsis ([place]) 04/30/2015  . Essential hypertension 04/30/2015  . Abdominal pain 04/30/2015  . Hyperlipidemia with target LDL less than 100  04/30/2015  . CAP (community acquired pneumonia)   . IPF (idiopathic pulmonary fibrosis) ([place]) 10/22/2014  . Chronic cough 10/22/2014  . GERD (gastroesophageal reflux disease) 02/05/2013  . Other emphysema ([place]) 10/23/2012  . Idiopathic pulmonary fibrosis, high clinical pretest probability, declined surgical lung biopsy 09/13/2012  . Dyspnea on exertion 08/03/2012  . Hypercholesterolemia 08/03/2012  . Hypertensive heart disease with CHF ([place]) 08/03/2012  . H/O abdominal aortic aneurysm repair 08/03/2012  . Ventral hernia 08/03/2012    Past Surgical History:  Procedure Laterality Date  . AORTA SURGERY  2002  . CHOLECYSTECTOMY N/A 10/05/2015   Procedure:  LAPAROSCOPIC CHOLECYSTECTOMY  ;  Surgeon: Autumn Messing III, MD;  Location: [facility];  Service: General;  Laterality: N/A;  . [place]  . INTRAOPERATIVE CHOLANGIOGRAM  10/05/2015   Procedure: INTRAOPERATIVE CHOLANGIOGRAM;  Surgeon: Autumn Messing III, MD;  Location: [facility];  Service: General;;  . LAPAROSCOPIC CHOLECYSTECTOMY  10/05/2015  . NM MYOVIEW LTD  05/2015   EF 49%. Small size, mild severity perfusion defect in the basal-mid inferoseptal wall. LOW RISK. No ischemia. Defect thought to be related to LBBB artifacts and not true lesion. This could also explain mild systolic dysfunction.  . TRANSTHORACIC ECHOCARDIOGRAM  04/2015   (In setting of acute illness).EF 40-45% with mid-apical anteroseptal akinesis. Moderately increased PA pressures of roughly 48 mmHg.  [name] URETHRAL STRICTURE DILATATION  late 1970s   urinary tract stretch        Home Medications    Prior to Admission medications   Medication Sig Start Date End Date Taking? Authorizing Provider  albuterol (PROVENTIL HFA;VENTOLIN HFA) 108 (90 Base) MCG/ACT inhaler Inhale 2 puffs into the lungs every 6 (six) hours as needed for wheezing or shortness of breath. 07/13/18   Hennie Duos, MD  albuterol (PROVENTIL) (2.5 MG/3ML) 0.083% nebulizer solution Take 3 mLs (2.5 mg total) by nebulization every 6 (six) hours as needed for wheezing or shortness of breath. 07/13/18   Hennie Duos, MD  apixaban (ELIQUIS) 5 MG TABS tablet Take 1 tablet (5 mg total) by mouth 2 (two) times daily. 07/13/18   Hennie Duos, MD  carvedilol (COREG) 12.5 MG tablet TAKE 1 TABLET (12.5 MG TOTAL) BY MOUTH 2 (TWO) TIMES DAILY WITH A MEAL. 07/13/18   Hennie Duos, MD  cholecalciferol (VITAMIN D) 1000 units tablet Take 1 tablet (1,000 Units total) by mouth daily. 07/13/18   Hennie Duos, MD  clopidogrel (PLAVIX) 75 MG tablet Take 1 tablet (75 mg total) by mouth daily. 07/13/18   Hennie Duos, MD  ezetimibe (ZETIA) 10 MG tablet Take 1 tablet  (10 mg total) by mouth daily. 07/13/18   Hennie Duos, MD  feeding supplement, ENSURE ENLIVE, (ENSURE ENLIVE) LIQD Take 237 mLs by mouth 2 (two) times daily between meals. 07/13/18   Hennie Duos, MD  furosemide (LASIX) 20 MG tablet Take 1 tablet (20 mg total) by mouth every Monday, Wednesday, and Friday. 07/13/18   Hennie Duos, MD  guaiFENesin (MUCINEX) 600 MG 12 hr tablet Take 2 tablets (1,200 mg total) by mouth 2 (two) times daily. 07/13/18   Hennie Duos, MD  ipratropium-albuterol (DUONEB) 0.5-2.5 (3) MG/3ML SOLN Take 3 mLs by nebulization every 6 (six) hours. 07/13/18   Hennie Duos, MD  losartan (COZAAR) 50 MG tablet Take 1 tablet (50 mg total) by mouth daily. 07/13/18   Hennie Duos, MD  Multiple Vitamin (MULTIVITAMIN WITH MINERALS) TABS tablet Take 1 tablet by  mouth daily. 07/13/18   Hennie Duos, MD  nitroGLYCERIN (NITROSTAT) 0.4 MG SL tablet Place 1 tablet (0.4 mg total) under the tongue every 5 (five) minutes as needed for chest pain. 07/13/18   Hennie Duos, MD  olopatadine (PATANOL) 0.1 % ophthalmic solution Place 1 drop into the left eye 2 (two) times daily. 07/13/18   Hennie Duos, MD  pantoprazole (PROTONIX) 40 MG tablet Take 1 tablet (40 mg total) by mouth daily. 07/13/18   Hennie Duos, MD  Pitavastatin Calcium (LIVALO) 2 MG TABS Take 1 tablet (2 mg total) by mouth See admin instructions. Every 3rd day 07/13/18   Hennie Duos, MD  potassium chloride (MICRO-K) 10 MEQ CR capsule Take 1 capsule (10 mEq total) by mouth every Monday, Wednesday, and Friday. **Take with lasix** 07/13/18   Hennie Duos, MD  Probiotic Product ([place]) CAPS Take 1 capsule by mouth daily. 07/13/18   Hennie Duos, MD  Tiotropium Bromide-Olodaterol (STIOLTO RESPIMAT) 2.5-2.5 MCG/ACT AERS Inhale 2 puffs into the lungs daily. 07/13/18   Hennie Duos, MD    Family History Family History  Problem Relation Age of Onset  .  Hypertension Mother   . Stroke Father   . Heart disease Father   . Colon cancer Brother     Social History Social History   Tobacco Use  . Smoking status: Former Smoker    Packs/day: 1.00    Years: 25.00    Pack years: 25.00    Types: Cigarettes    Last attempt to quit: 08/22/1974    Years since quitting: 44.0  . Smokeless tobacco: Never Used  Substance Use Topics  . Alcohol use: No  . Drug use: No     Allergies   Ciprofloxacin and Tape   Review of Systems Review of Systems  Constitutional: Positive for fever (Subjective). Negative for chills, diaphoresis and fatigue.  HENT: Positive for congestion and rhinorrhea. Negative for sneezing.   Eyes: Negative.   Respiratory: Positive for cough, shortness of breath and wheezing. Negative for chest tightness.   Cardiovascular: Positive for leg swelling (At baseline). Negative for chest pain.  Gastrointestinal: Negative for abdominal pain, blood in stool, diarrhea, nausea and vomiting.  Genitourinary: Negative for difficulty urinating, flank pain, frequency and hematuria.  Musculoskeletal: Negative for arthralgias and back pain.  Skin: Negative for rash.  Neurological: Negative for dizziness, speech difficulty, weakness, numbness and headaches.     Physical Exam Updated Vital Signs BP (!) 153/64   Pulse 86   Temp 98.1 F (36.7 C) (Oral)   Resp (!) 29   SpO2 94%   Physical Exam Constitutional:      Appearance: He is well-developed.  HENT:     Head: Normocephalic and atraumatic.  Eyes:     Pupils: Pupils are equal, round, and reactive to light.  Neck:     Musculoskeletal: Normal range of motion and neck supple.  Cardiovascular:     Rate and Rhythm: Normal rate and regular rhythm.     Heart sounds: Normal heart sounds.  Pulmonary:     Effort: Tachypnea and accessory muscle usage present. No respiratory distress.     Breath sounds: Decreased breath sounds and wheezing present. No rales.  Chest:     Chest wall: No  tenderness.  Abdominal:     General: Bowel sounds are normal.     Palpations: Abdomen is soft.     Tenderness: There is no abdominal tenderness. There is no  guarding or rebound.  Musculoskeletal: Normal range of motion.     Right lower leg: Edema present.     Left lower leg: Edema present.     Comments: 1+ pain edema bilaterally  Lymphadenopathy:     Cervical: No cervical adenopathy.  Skin:    General: Skin is warm and dry.     Findings: No rash.  Neurological:     Mental Status: He is alert and oriented to person, place, and time.      ED Treatments / Results  Labs (all labs ordered are listed, but only abnormal results are displayed) Labs Reviewed  BASIC METABOLIC PANEL - Abnormal; Notable for the following components:      Result Value   CO2 20 (*)    Glucose, Bld 104 (*)    Calcium 8.8 (*)    All other components within normal limits  CBC WITH DIFFERENTIAL/PLATELET - Abnormal; Notable for the following components:   WBC 14.7 (*)    Neutro Abs 10.3 (*)    Monocytes Absolute 1.2 (*)    Eosinophils Absolute 0.6 (*)    All other components within normal limits  BRAIN NATRIURETIC PEPTIDE  INFLUENZA PANEL BY PCR (TYPE A & B)  URINALYSIS, ROUTINE W REFLEX MICROSCOPIC  I-STAT TROPONIN, ED    EKG EKG Interpretation  Date/Time:  Monday August 13 2018 14:36:13 EST Ventricular Rate:  94 PR Interval:    QRS Duration: 126 QT Interval:  374 QTC Calculation: 468 R Axis:   -37 Text Interpretation:  Sinus tachycardia Multiple premature complexes, vent & supraven Prolonged PR interval Left bundle branch block Baseline wander in lead(s) V2 since last tracing no significant change Confirmed by Malvin Johns [phone]) on 08/13/2018 3:03:36 PM   Radiology Dg Chest 2 View  Result Date: 08/13/2018 CLINICAL DATA:  Short of breath. EXAM: CHEST - 2 VIEW COMPARISON:  08/13/2018 FINDINGS: Normal heart size. Decreased lung volumes. There is no pleural effusion or edema identified.  Chronic coarsened interstitial markings are identified bilaterally. No superimposed airspace densities. IMPRESSION: 1. Chronic lung disease without superimposed acute cardiopulmonary abnormality. Electronically Signed   By: Kerby Moors M.D.   On: 08/13/2018 14:28    Procedures Procedures (including critical care time)  Medications Ordered in ED Medications  albuterol (PROVENTIL) (2.5 MG/3ML) 0.083% nebulizer solution 5 mg (5 mg Nebulization Given 08/13/18 1343)  ipratropium-albuterol (DUONEB) 0.5-2.5 (3) MG/3ML nebulizer solution 3 mL (3 mLs Nebulization Given 08/13/18 1554)  methylPREDNISolone sodium succinate (SOLU-MEDROL) 125 mg/2 mL injection 125 mg (125 mg Intravenous Given 08/13/18 1554)     Initial Impression / Assessment and Plan / ED Course  I have reviewed the triage vital signs and the nursing notes.  Pertinent labs & imaging results that were available during my care of the patient were reviewed by me and considered in my medical decision making (see chart for details).    Patient is a 84 year old male who has a history of COPD pulmonary fibrosis and CHF who presents with shortness of breath.  He was very wheezy on arrival and tachypneic.  He had nebulizer treatments in the ED and his breathing is better but he still tachypneic and has a new oxygen requirement.  His oxygen saturations are dipping down into the upper 80s low 90s on room air.  He normally only wears oxygen at night when he sleeping.  He is currently on nasal cannula and doing well on that although he still little tachypneic.  He has no suggestions of CHF.  He was given Solu-Medrol.  He has no suggestions of pneumonia.  His white count is a little bit elevated although he does not have a fever here.  He had a reported fever at home.  His wife is concerned because he has frequent UTIs so I did add on a urine but this has not been collected yet.  His other labs are non-concerning.  I spoke with Dr.Rathore who will admit  the patient for further treatment.  CRITICAL CARE Performed by: Malvin Johns Total critical care time: 60 minutes Critical care time was exclusive of separately billable procedures and treating other patients. Critical care was necessary to treat or prevent imminent or life-threatening deterioration. Critical care was time spent personally by me on the following activities: development of treatment plan with patient and/or surrogate as well as nursing, discussions with consultants, evaluation of patient's response to treatment, examination of patient, obtaining history from patient or surrogate, ordering and performing treatments and interventions, ordering and review of laboratory studies, ordering and review of radiographic studies, pulse oximetry and re-evaluation of patient's condition.   Final Clinical Impressions(s) / ED Diagnoses   Final diagnoses:  COPD exacerbation [facility] [place])    ED Discharge Orders    None       Malvin Johns, MD 08/13/18 [phone]

## 2018-08-13 NOTE — ED Notes (Signed)
ED Provider at bedside. 

## 2018-08-13 NOTE — H&P (Signed)
History and Physical    Lee [name] JQB:[id number] DOB: 1931-05-01 DOA: 08/13/2018  PCP: [name], MD Patient coming from: Home  Chief Complaint: Cough  HPI: Lee [name] is a 84 y.o. male with medical history significant of chronic systolic congestive heart failure, COPD, interstitial lung disease, chronic respiratory failure on home oxygen, hypertension, hyperlipidemia, PVD, CVA presenting to the hospital for evaluation of cough. Patient was admitted in October for PE and is currently on Eliquis.  Wife at bedside states patient has been having a runny nose and coughing aggressively for the past 1 day.  He has also been wheezing.  States he was not as alert this morning.  She called his PCPs office and they were advised to come into the ED.  States patient has been coughing up sputum.  He uses 2 L oxygen at night.  Wife believes he was feeling warm this morning and she gave him Tylenol.  States patient has "cognitive problems" and is oriented to person and place only at baseline.  Patient denies having any chest pain or shortness of breath.  No further history could be obtained from him.  ED Course: Tachypneic. White count 14.7.  BNP normal.  I-STAT troponin negative.  Influenza panel negative.  UA pending.  Chest x-ray showing chronic lung disease without superimposed acute cardiopulmonary abnormality.  Review of Systems: As per HPI otherwise 10 point review of systems negative.  Past Medical History:  Diagnosis Date  . Allergic rhinitis   . Anxiety   . Basal cell carcinoma of face   . Cardiomyopathy ([place]) 04/2015   EF 40-45% by echo. Apical anterior akinesis seen on echo, not confirmed on Myoview was negative for ischemia. Myoview suggested a small basal inferoseptal defect.  . Cholecystitis    a. complex admit 04/2015 due to sepsis due to acute Escherichia coli cholecystitis and right lower lobe CAP and Citrobacter UTI.  [name] Chronic respiratory failure ([place])    a. on home O2 after  discharge 04/2015.  [name] Chronic systolic CHF (congestive heart failure) ([place])    a. Dx 04/2015 during complex admission for cholecystitis - EF 40-45% +WMA.  [name] COPD (chronic obstructive pulmonary disease) ([place])   . Erectile dysfunction   . History of endovascular stent graft for abdominal aortic aneurysm 2002   a. s/p repair  Followed by Dr. Donnetta Hutching (stable evaluation 01/2106)  . History of hiatal hernia   . HTN (hypertension)   . Hyperlipidemia   . Interstitial lung disease ([place])    a. followed by pulm.  [name] LBBB (left bundle branch block)    a. Intermittent - seen in 2013, not in 09/2013, but again in 04/2015.  . Obesity   . On home oxygen therapy    "just at night" (10/05/2015)  . PVD (peripheral vascular disease) ([place])   . Shingles   . Stroke [facility])    a. H/o aphasia - was told he'd had ministrokes. Imaging 09/2014 revealed prior infarct.    Past Surgical History:  Procedure Laterality Date  . AORTA SURGERY  2002  . CHOLECYSTECTOMY N/A 10/05/2015   Procedure: LAPAROSCOPIC CHOLECYSTECTOMY  ;  Surgeon: Autumn Messing III, MD;  Location: [place];  Service: General;  Laterality: N/A;  . [place]  . INTRAOPERATIVE CHOLANGIOGRAM  10/05/2015   Procedure: INTRAOPERATIVE CHOLANGIOGRAM;  Surgeon: Autumn Messing III, MD;  Location: [place];  Service: General;;  . LAPAROSCOPIC CHOLECYSTECTOMY  10/05/2015  . NM MYOVIEW LTD  05/2015   EF 49%.  Small size, mild severity perfusion defect in the basal-mid inferoseptal wall. LOW RISK. No ischemia. Defect thought to be related to LBBB artifacts and not true lesion. This could also explain mild systolic dysfunction.  . TRANSTHORACIC ECHOCARDIOGRAM  04/2015   (In setting of acute illness).EF 40-45% with mid-apical anteroseptal akinesis. Moderately increased PA pressures of roughly 48 mmHg.  [name] URETHRAL STRICTURE DILATATION  late 1970s   urinary tract stretch     reports that he quit smoking about 44 years ago. His smoking use included cigarettes. He has a 25.00  pack-year smoking history. He has never used smokeless tobacco. He reports that he does not drink alcohol or use drugs.  Allergies  Allergen Reactions  . Ciprofloxacin Other (See Comments)    Tingle feeling throughout body  . Tape Other (See Comments)    Redness, Please use "paper" tape    Family History  Problem Relation Age of Onset  . Hypertension Mother   . Stroke Father   . Heart disease Father   . Colon cancer Brother     Prior to Admission medications   Medication Sig Start Date End Date Taking? Authorizing Provider  albuterol (PROVENTIL HFA;VENTOLIN HFA) 108 (90 Base) MCG/ACT inhaler Inhale 2 puffs into the lungs every 6 (six) hours as needed for wheezing or shortness of breath. 07/13/18   Hennie Duos, MD  albuterol (PROVENTIL) (2.5 MG/3ML) 0.083% nebulizer solution Take 3 mLs (2.5 mg total) by nebulization every 6 (six) hours as needed for wheezing or shortness of breath. 07/13/18   Hennie Duos, MD  apixaban (ELIQUIS) 5 MG TABS tablet Take 1 tablet (5 mg total) by mouth 2 (two) times daily. 07/13/18   Hennie Duos, MD  carvedilol (COREG) 12.5 MG tablet TAKE 1 TABLET (12.5 MG TOTAL) BY MOUTH 2 (TWO) TIMES DAILY WITH A MEAL. 07/13/18   Hennie Duos, MD  cholecalciferol (VITAMIN D) 1000 units tablet Take 1 tablet (1,000 Units total) by mouth daily. 07/13/18   Hennie Duos, MD  clopidogrel (PLAVIX) 75 MG tablet Take 1 tablet (75 mg total) by mouth daily. 07/13/18   Hennie Duos, MD  ezetimibe (ZETIA) 10 MG tablet Take 1 tablet (10 mg total) by mouth daily. 07/13/18   Hennie Duos, MD  feeding supplement, ENSURE ENLIVE, (ENSURE ENLIVE) LIQD Take 237 mLs by mouth 2 (two) times daily between meals. 07/13/18   Hennie Duos, MD  furosemide (LASIX) 20 MG tablet Take 1 tablet (20 mg total) by mouth every Monday, Wednesday, and Friday. 07/13/18   Hennie Duos, MD  guaiFENesin (MUCINEX) 600 MG 12 hr tablet Take 2 tablets (1,200 mg total) by  mouth 2 (two) times daily. 07/13/18   Hennie Duos, MD  ipratropium-albuterol (DUONEB) 0.5-2.5 (3) MG/3ML SOLN Take 3 mLs by nebulization every 6 (six) hours. 07/13/18   Hennie Duos, MD  losartan (COZAAR) 50 MG tablet Take 1 tablet (50 mg total) by mouth daily. 07/13/18   Hennie Duos, MD  Multiple Vitamin (MULTIVITAMIN WITH MINERALS) TABS tablet Take 1 tablet by mouth daily. 07/13/18   Hennie Duos, MD  nitroGLYCERIN (NITROSTAT) 0.4 MG SL tablet Place 1 tablet (0.4 mg total) under the tongue every 5 (five) minutes as needed for chest pain. 07/13/18   Hennie Duos, MD  olopatadine (PATANOL) 0.1 % ophthalmic solution Place 1 drop into the left eye 2 (two) times daily. 07/13/18   Hennie Duos, MD  pantoprazole (PROTONIX) 40 MG tablet Take  1 tablet (40 mg total) by mouth daily. 07/13/18   Hennie Duos, MD  Pitavastatin Calcium (LIVALO) 2 MG TABS Take 1 tablet (2 mg total) by mouth See admin instructions. Every 3rd day 07/13/18   Hennie Duos, MD  potassium chloride (MICRO-K) 10 MEQ CR capsule Take 1 capsule (10 mEq total) by mouth every Monday, Wednesday, and Friday. **Take with lasix** 07/13/18   Hennie Duos, MD  Probiotic Product ([place]) CAPS Take 1 capsule by mouth daily. 07/13/18   Hennie Duos, MD  Tiotropium Bromide-Olodaterol (STIOLTO RESPIMAT) 2.5-2.5 MCG/ACT AERS Inhale 2 puffs into the lungs daily. 07/13/18   Hennie Duos, MD    Physical Exam: Vitals:   08/13/18 2030 08/13/18 2131 08/13/18 2332 08/14/18 0021  BP: 136/66   132/69  Pulse: 89   91  Resp: (!) 27   19  Temp:      TempSrc:      SpO2: 92%  93% 93%  Weight:  92.6 kg    Height:  5\' 6"  (1.676 m)      Physical Exam  Constitutional: He appears well-developed and well-nourished. No distress.  HENT:  Head: Normocephalic.  Mouth/Throat: Oropharynx is clear and moist.  Eyes: Right eye exhibits no discharge. Left eye exhibits no discharge.  Neck: Neck  supple.  Cardiovascular: Normal rate, regular rhythm and intact distal pulses.  Pulmonary/Chest: He has wheezes. He has no rales.  No increased work of breathing  Abdominal: Soft. Bowel sounds are normal. He exhibits no distension. There is no abdominal tenderness. There is no guarding.  Musculoskeletal:        General: No edema.  Neurological:  Awake and alert Oriented to person and place only (baseline per wife)  Skin: Skin is warm and dry. He is not diaphoretic.     Labs on Admission: I have personally reviewed following labs and imaging studies  CBC: Recent Labs  Lab 08/13/18 1513  WBC 14.7*  NEUTROABS 10.3*  HGB 13.9  HCT 43.9  MCV 95.4  PLT 096   Basic Metabolic Panel: Recent Labs  Lab 08/13/18 1513  NA 136  K 4.4  CL 104  CO2 20*  GLUCOSE 104*  BUN 9  CREATININE 0.90  CALCIUM 8.8*   GFR: Estimated Creatinine Clearance: 63.9 mL/min (by C-G formula based on SCr of 0.9 mg/dL). Liver Function Tests: No results for input(s): AST, ALT, ALKPHOS, BILITOT, PROT, ALBUMIN in the last 168 hours. No results for input(s): LIPASE, AMYLASE in the last 168 hours. No results for input(s): AMMONIA in the last 168 hours. Coagulation Profile: No results for input(s): INR, PROTIME in the last 168 hours. Cardiac Enzymes: No results for input(s): CKTOTAL, CKMB, CKMBINDEX, TROPONINI in the last 168 hours. BNP (last 3 results) No results for input(s): PROBNP in the last 8760 hours. HbA1C: No results for input(s): HGBA1C in the last 72 hours. CBG: No results for input(s): GLUCAP in the last 168 hours. Lipid Profile: No results for input(s): CHOL, HDL, LDLCALC, TRIG, CHOLHDL, LDLDIRECT in the last 72 hours. Thyroid Function Tests: No results for input(s): TSH, T4TOTAL, FREET4, T3FREE, THYROIDAB in the last 72 hours. Anemia Panel: No results for input(s): VITAMINB12, FOLATE, FERRITIN, TIBC, IRON, RETICCTPCT in the last 72 hours. Urine analysis:    Component Value Date/Time     COLORURINE STRAW (A) 06/15/2018 [place] 06/15/2018 1850   LABSPEC 1.006 06/15/2018 1850   PHURINE 5.0 06/15/2018 [place] 06/15/2018 1850  HGBUR NEGATIVE 06/15/2018 1850   BILIRUBINUR NEGATIVE 06/15/2018 1850   KETONESUR NEGATIVE 06/15/2018 1850   PROTEINUR NEGATIVE 06/15/2018 1850   UROBILINOGEN 1.0 04/30/2015 2123   NITRITE NEGATIVE 06/15/2018 1850   LEUKOCYTESUR TRACE (A) 06/15/2018 1850    Radiological Exams on Admission: Dg Chest 2 View  Result Date: 08/13/2018 CLINICAL DATA:  Short of breath. EXAM: CHEST - 2 VIEW COMPARISON:  08/13/2018 FINDINGS: Normal heart size. Decreased lung volumes. There is no pleural effusion or edema identified. Chronic coarsened interstitial markings are identified bilaterally. No superimposed airspace densities. IMPRESSION: 1. Chronic lung disease without superimposed acute cardiopulmonary abnormality. Electronically Signed   By: Kerby Moors M.D.   On: 08/13/2018 14:28    EKG: Independently reviewed.  Sinus rhythm (heart rate 94), LBBB.  No significant change since prior tracing.  Assessment/Plan Principal Problem:   COPD exacerbation (HCC) Active Problems:   GERD (gastroesophageal reflux disease)   Essential hypertension   Hyperlipidemia with target LDL less than 100   Stroke [facility])   Pulmonary emboli (HCC)   Chronic systolic CHF (congestive heart failure) (HCC)   Physical deconditioning   Acute COPD exacerbation Presenting with 1 day history of cough and wheezing.  Tachypneic upon presentation to the ED.  Wheezing on exam.  White count 14.7. Chest x-ray showing chronic lung disease without superimposed acute cardiopulmonary abnormality.  Currently satting well on room air. -Received Solu-Medrol 125 mg in the ED.  Continue prednisone 40 mg daily. -Duo nebs every 6 hours -Albuterol every 2 hours as needed -Azithromycin -Mucinex DM -Supplemental oxygen if needed  History of PE Not hypoxic or  tachycardic. -Continue home Eliquis  Hypertension Currently normotensive. -Continue home carvedilol -Hold home Lasix and losartan at this time  Chronic systolic congestive heart failure Currently not volume overloaded on exam. -Continue home carvedilol -Hold home Lasix and losartan at this time  History of CVA -Continue home Plavix  Hyperlipidemia -Continue home Zetia, statin  GERD -Continue PPI  Physical deconditioning -PT evaluation  DVT prophylaxis: Eliquis Code Status: Full code.  Discussed with patient and wife. Family Communication: Wife at bedside. Disposition Plan: Anticipate discharge after clinical improvement. Consults called: None Admission status: Observation   Shela Leff MD [facility] Pager [phone]  If 7PM-7AM, please contact night-coverage [email or web address] Password [email or web address]  08/14/2018, 2:55 AM

## 2018-08-13 NOTE — Care Management Note (Signed)
Case Management Note  CM contacted my Dorian Pod with [facility] to advise that the pt is active with their services.  She will follow for needs.  Itzamar Traynor, Benjaman Lobe, RN 08/13/2018, 3:21 PM

## 2018-08-13 NOTE — ED Triage Notes (Signed)
Pt with persistent cough and runny nose, wife reports he seems more spacey like he was when he had pneumonia and aspirated. She gave 2 puffs of albuterol and the nebulizer. Pt alert at triage.

## 2018-08-14 DIAGNOSIS — N529 Male erectile dysfunction, unspecified: Secondary | ICD-10-CM | POA: Diagnosis present

## 2018-08-14 DIAGNOSIS — Z8249 Family history of ischemic heart disease and other diseases of the circulatory system: Secondary | ICD-10-CM | POA: Diagnosis not present

## 2018-08-14 DIAGNOSIS — I5042 Chronic combined systolic (congestive) and diastolic (congestive) heart failure: Secondary | ICD-10-CM | POA: Diagnosis present

## 2018-08-14 DIAGNOSIS — K219 Gastro-esophageal reflux disease without esophagitis: Secondary | ICD-10-CM | POA: Diagnosis present

## 2018-08-14 DIAGNOSIS — E669 Obesity, unspecified: Secondary | ICD-10-CM | POA: Diagnosis present

## 2018-08-14 DIAGNOSIS — J441 Chronic obstructive pulmonary disease with (acute) exacerbation: Secondary | ICD-10-CM | POA: Diagnosis present

## 2018-08-14 DIAGNOSIS — E785 Hyperlipidemia, unspecified: Secondary | ICD-10-CM | POA: Diagnosis present

## 2018-08-14 DIAGNOSIS — R0602 Shortness of breath: Secondary | ICD-10-CM | POA: Diagnosis present

## 2018-08-14 DIAGNOSIS — Z8619 Personal history of other infectious and parasitic diseases: Secondary | ICD-10-CM | POA: Diagnosis not present

## 2018-08-14 DIAGNOSIS — I10 Essential (primary) hypertension: Secondary | ICD-10-CM | POA: Diagnosis not present

## 2018-08-14 DIAGNOSIS — J9611 Chronic respiratory failure with hypoxia: Secondary | ICD-10-CM | POA: Diagnosis present

## 2018-08-14 DIAGNOSIS — R5381 Other malaise: Secondary | ICD-10-CM | POA: Diagnosis present

## 2018-08-14 DIAGNOSIS — Z87891 Personal history of nicotine dependence: Secondary | ICD-10-CM | POA: Diagnosis not present

## 2018-08-14 DIAGNOSIS — Z823 Family history of stroke: Secondary | ICD-10-CM | POA: Diagnosis not present

## 2018-08-14 DIAGNOSIS — F0390 Unspecified dementia without behavioral disturbance: Secondary | ICD-10-CM | POA: Diagnosis present

## 2018-08-14 DIAGNOSIS — Z86711 Personal history of pulmonary embolism: Secondary | ICD-10-CM | POA: Diagnosis not present

## 2018-08-14 DIAGNOSIS — I5022 Chronic systolic (congestive) heart failure: Secondary | ICD-10-CM

## 2018-08-14 DIAGNOSIS — Z9981 Dependence on supplemental oxygen: Secondary | ICD-10-CM | POA: Diagnosis not present

## 2018-08-14 DIAGNOSIS — Z881 Allergy status to other antibiotic agents status: Secondary | ICD-10-CM | POA: Diagnosis not present

## 2018-08-14 DIAGNOSIS — I447 Left bundle-branch block, unspecified: Secondary | ICD-10-CM | POA: Diagnosis present

## 2018-08-14 DIAGNOSIS — I429 Cardiomyopathy, unspecified: Secondary | ICD-10-CM | POA: Diagnosis present

## 2018-08-14 DIAGNOSIS — I110 Hypertensive heart disease with heart failure: Secondary | ICD-10-CM | POA: Diagnosis present

## 2018-08-14 DIAGNOSIS — I739 Peripheral vascular disease, unspecified: Secondary | ICD-10-CM | POA: Diagnosis present

## 2018-08-14 DIAGNOSIS — Z85828 Personal history of other malignant neoplasm of skin: Secondary | ICD-10-CM | POA: Diagnosis not present

## 2018-08-14 DIAGNOSIS — Z6832 Body mass index (BMI) 32.0-32.9, adult: Secondary | ICD-10-CM | POA: Diagnosis not present

## 2018-08-14 DIAGNOSIS — Z8679 Personal history of other diseases of the circulatory system: Secondary | ICD-10-CM | POA: Diagnosis not present

## 2018-08-14 DIAGNOSIS — Z8673 Personal history of transient ischemic attack (TIA), and cerebral infarction without residual deficits: Secondary | ICD-10-CM | POA: Diagnosis not present

## 2018-08-14 LAB — CBC
HCT: 39.2 % (ref 39.0–52.0)
Hemoglobin: 13 g/dL (ref 13.0–17.0)
MCH: 30.4 pg (ref 26.0–34.0)
MCHC: 33.2 g/dL (ref 30.0–36.0)
MCV: 91.8 fL (ref 80.0–100.0)
PLATELETS: 255 10*3/uL (ref 150–400)
RBC: 4.27 MIL/uL (ref 4.22–5.81)
RDW: 13.9 % (ref 11.5–15.5)
WBC: 8.7 10*3/uL (ref 4.0–10.5)
nRBC: 0 % (ref 0.0–0.2)

## 2018-08-14 LAB — GLUCOSE, CAPILLARY: Glucose-Capillary: 130 mg/dL — ABNORMAL HIGH (ref 70–99)

## 2018-08-14 MED ORDER — METHYLPREDNISOLONE SODIUM SUCC 125 MG IJ SOLR
60.0000 mg | Freq: Three times a day (TID) | INTRAMUSCULAR | Status: DC
  Administered 2018-08-14 – 2018-08-15 (×5): 60 mg via INTRAVENOUS
  Filled 2018-08-14 (×5): qty 2

## 2018-08-14 MED ORDER — FUROSEMIDE 20 MG PO TABS
20.0000 mg | ORAL_TABLET | Freq: Every day | ORAL | Status: DC
  Administered 2018-08-14 – 2018-08-16 (×3): 20 mg via ORAL
  Filled 2018-08-14 (×3): qty 1

## 2018-08-14 MED ORDER — IPRATROPIUM-ALBUTEROL 0.5-2.5 (3) MG/3ML IN SOLN
3.0000 mL | Freq: Three times a day (TID) | RESPIRATORY_TRACT | Status: DC
  Administered 2018-08-14 – 2018-08-15 (×3): 3 mL via RESPIRATORY_TRACT
  Filled 2018-08-14 (×3): qty 3

## 2018-08-14 NOTE — Progress Notes (Signed)
PROGRESS NOTE        PATIENT DETAILS Name: Lee [name] Age: 84 y.o. Sex: male Date of Birth: [birth year] Admit Date: 08/13/2018 Admitting Physician Shela Leff, MD [id number], Elta [place], MD  Brief Narrative: Patient is a 84 y.o. male with history of COPD, dementia presenting with several days history of cough, nasal discharge and worsening shortness of breath.  Thought to have COPD exacerbation and admitted to the hospitalist service.  See below for further details  Subjective: Although improved-still coughing-still wheezing-appears very weak and deconditioned.  Per spouse-not at baseline although improved.  Assessment/Plan: COPD exacerbation: Slow improvement overnight-still wheezing-continues to have coughing spells-Per spouse-he is still not at his baseline.  Plans are to continue IV steroids, bronchodilators, Mucinex, antitussives.  Ambulate with physical therapy-discussed with RN-needs to be mobilized-out of bed to chair.  Do repeated episodes of cough-we will get speech therapy evaluation to make sure he is not aspirating.  History of pulmonary embolism: Continue Eliquis.  History of IPF with chronic hypoxic respiratory failure: Chest x-ray appears to be stable-unchanged-continue follow-up with Dr. Chase Caller in the outpatient therapy.  Mostly is on nocturnal O2.  Chronic diastolic heart failure: Volume status seems to be reasonable-resume Lasix.  Hypertension: BP stable-continue Coreg.  Dyslipidemia: Continue statin  GERD: Continue PPI  Dementia/cognitive dysfunction: Spouse at bedside does acknowledge memory issues-he is pleasantly confused this morning-Per spouse is mentation is really not that different from his usual baseline.  Deconditioning/debility: Worse than his usual baseline due to acute illness-await PT evaluation.  DVT Prophylaxis: Full dose anticoagulation with Eliquis  Code Status: Full code-but spouse does not want him "on  life support for a long time"  Family Communication: Spouse at bedside  Disposition Plan: Remain inpatient-home with home health services over the next few days.   Antimicrobial agents: Anti-infectives (From admission, onward)   Start     Dose/Rate Route Frequency Ordered Stop   08/14/18 2100  azithromycin (ZITHROMAX) tablet 250 mg     250 mg Oral Daily 08/13/18 2159     08/13/18 2200  azithromycin (ZITHROMAX) tablet 500 mg     500 mg Oral  Once 08/13/18 2159 08/13/18 2230      Procedures: None  CONSULTS:  None  Time spent: 25- minutes-Greater than 50% of this time was spent in counseling, explanation of diagnosis, planning of further management, and coordination of care.  MEDICATIONS: Scheduled Meds: . apixaban  5 mg Oral BID WC  . azithromycin  250 mg Oral Daily  . carvedilol  12.5 mg Oral BID WC  . cholecalciferol  1,000 Units Oral Q supper  . clopidogrel  75 mg Oral Q1500  . dextromethorphan-guaiFENesin  1 tablet Oral BID  . ezetimibe  10 mg Oral Daily  . feeding supplement (ENSURE ENLIVE)  237 mL Oral Q1500  . ipratropium-albuterol  3 mL Nebulization TID  . methylPREDNISolone (SOLU-MEDROL) injection  60 mg Intravenous Q8H  . multivitamin with minerals  1 tablet Oral Daily  . olopatadine  1 drop Left Eye QHS  . pantoprazole  40 mg Oral QAC breakfast  . pravastatin  20 mg Oral q1800   Continuous Infusions: PRN Meds:.albuterol   PHYSICAL EXAM: Vital signs: Vitals:   08/14/18 0259 08/14/18 0342 08/14/18 0719 08/14/18 0808  BP:  132/68    Pulse:  80  82  Resp:  (!) 26  19  Temp:  97.6 F (36.4 C)  97.8 F (36.6 C)  TempSrc:  Oral  Oral  SpO2: 93% 91% 95% 97%  Weight:  92.6 kg    Height:       Filed Weights   08/13/18 2131 08/14/18 0342  Weight: 92.6 kg 92.6 kg   Body mass index is 32.95 kg/m.   General appearance :Awake, alert, not in any distress. Eyes:, pupils equally reactive to light and accomodation,no scleral icterus. HEENT: Atraumatic and  Normocephalic Neck: supple, no JVD. Resp:Good air entry bilaterally, coarse rhonchi all over CVS: S1 S2 regular GI: Bowel sounds present, Non tender and not distended with no gaurding, rigidity or rebound.No organomegaly Extremities: B/L Lower Ext shows no edema, both legs are warm to touch Neurology: Pleasantly confused-but seems to move all 4 extremities Musculoskeletal:No digital cyanosis Skin:No Rash, warm and dry Wounds:N/A  I have personally reviewed following labs and imaging studies  LABORATORY DATA: CBC: Recent Labs  Lab 08/13/18 1513 08/14/18 0339  WBC 14.7* 8.7  NEUTROABS 10.3*  --   HGB 13.9 13.0  HCT 43.9 39.2  MCV 95.4 91.8  PLT 275 585    Basic Metabolic Panel: Recent Labs  Lab 08/13/18 1513  NA 136  K 4.4  CL 104  CO2 20*  GLUCOSE 104*  BUN 9  CREATININE 0.90  CALCIUM 8.8*    GFR: Estimated Creatinine Clearance: 63.9 mL/min (by C-G formula based on SCr of 0.9 mg/dL).  Liver Function Tests: No results for input(s): AST, ALT, ALKPHOS, BILITOT, PROT, ALBUMIN in the last 168 hours. No results for input(s): LIPASE, AMYLASE in the last 168 hours. No results for input(s): AMMONIA in the last 168 hours.  Coagulation Profile: No results for input(s): INR, PROTIME in the last 168 hours.  Cardiac Enzymes: No results for input(s): CKTOTAL, CKMB, CKMBINDEX, TROPONINI in the last 168 hours.  BNP (last 3 results) No results for input(s): PROBNP in the last 8760 hours.  HbA1C: No results for input(s): HGBA1C in the last 72 hours.  CBG: No results for input(s): GLUCAP in the last 168 hours.  Lipid Profile: No results for input(s): CHOL, HDL, LDLCALC, TRIG, CHOLHDL, LDLDIRECT in the last 72 hours.  Thyroid Function Tests: No results for input(s): TSH, T4TOTAL, FREET4, T3FREE, THYROIDAB in the last 72 hours.  Anemia Panel: No results for input(s): VITAMINB12, FOLATE, FERRITIN, TIBC, IRON, RETICCTPCT in the last 72 hours.  Urine analysis:      Component Value Date/Time   COLORURINE STRAW (A) 06/15/2018 1850   APPEARANCEUR CLEAR 06/15/2018 1850   LABSPEC 1.006 06/15/2018 1850   PHURINE 5.0 06/15/2018 1850   GLUCOSEU NEGATIVE 06/15/2018 1850   HGBUR NEGATIVE 06/15/2018 1850   BILIRUBINUR NEGATIVE 06/15/2018 1850   KETONESUR NEGATIVE 06/15/2018 1850   PROTEINUR NEGATIVE 06/15/2018 1850   UROBILINOGEN 1.0 04/30/2015 2123   NITRITE NEGATIVE 06/15/2018 1850   LEUKOCYTESUR TRACE (A) 06/15/2018 1850    Sepsis Labs: Lactic Acid, Venous    Component Value Date/Time   LATICACIDVEN 1.29 06/15/2018 1347    MICROBIOLOGY: Recent Results (from the past 240 hour(s))  MRSA PCR Screening     Status: None   Collection Time: 08/13/18  9:21 PM  Result Value Ref Range Status   MRSA by PCR NEGATIVE NEGATIVE Final    Comment:        The GeneXpert MRSA Assay (FDA approved for NASAL specimens only), is one component of a comprehensive MRSA colonization surveillance program. It is not intended to diagnose MRSA infection  nor to guide or monitor treatment for MRSA infections. Performed at [facility], [place] [address] [place], [place] [address]     RADIOLOGY STUDIES/RESULTS: Dg Chest 2 View  Result Date: 08/13/2018 CLINICAL DATA:  Short of breath. EXAM: CHEST - 2 VIEW COMPARISON:  08/13/2018 FINDINGS: Normal heart size. Decreased lung volumes. There is no pleural effusion or edema identified. Chronic coarsened interstitial markings are identified bilaterally. No superimposed airspace densities. IMPRESSION: 1. Chronic lung disease without superimposed acute cardiopulmonary abnormality. Electronically Signed   By: Kerby Moors M.D.   On: 08/13/2018 14:28     LOS: 0 days   Oren Binet, MD  [facility]  If 7PM-7AM, please contact night-coverage  Please page via [email or web address]-Password [email or web address]-click on MD name and type text message  08/14/2018, 10:44 AM

## 2018-08-14 NOTE — Evaluation (Signed)
Physical Therapy Evaluation Patient Details Name: Lee [name] MRN: [id number] DOB: 03/30/1931 Today's Date: 08/14/2018   History of Present Illness  Lee [name] is a 84 y.o. male with medical history significant of chronic systolic congestive heart failure, COPD, interstitial lung disease, chronic respiratory failure on home oxygen, hypertension, hyperlipidemia, PVD, CVA presenting to the hospital for evaluation of cough. Patient was admitted in October for PE and is currently on Eliquis.  Wife at bedside states patient has been having a runny nose and coughing aggressively for the past 1 day.  He has also been wheezing.  States he was not as alert this morning.  She called his PCPs office and they were advised to come into the ED.  States patient has been coughing up sputum.  He uses 2 L oxygen at night.    Clinical Impression  Pt admitted with above diagnosis. Pt currently with functional limitations due to the deficits listed below (see PT Problem List). Pt was able to ambulate with mod assist and cues with RW.  Caregiver present and states pt is close to baseline.  Will have 24 hour care at home.  Pt will benefit from skilled PT to increase their independence and safety with mobility to allow discharge to the venue listed below.      Follow Up Recommendations Home health PT;Supervision/Assistance - 24 hour    Equipment Recommendations  None recommended by PT    Recommendations for Other Services       Precautions / Restrictions Precautions Precautions: Fall Restrictions Weight Bearing Restrictions: No      Mobility  Bed Mobility Overal bed mobility: Needs Assistance Bed Mobility: Supine to Sit     Supine to sit: Min assist     General bed mobility comments: Cues for technique, sequencing- assist to elevate trunk and scoot to EOB. 2/4 DOE.   Transfers Overall transfer level: Needs assistance Equipment used: Rolling walker (2 wheeled) Transfers: Sit to/from Stand Sit  to Stand: Mod assist;From elevated surface         General transfer comment: Assist to stand from EOB with mod assist to power up.  Pt maintains flexed trunk, hips and knees.  Pt with posterior bias and LEs leaning on bed rail. Pt needs  mod cues for hand placement throughout as he places hands on front of RW and not on handrests.   Poor following of commands overall with at least 5 second delay.    Ambulation/Gait Ambulation/Gait assistance: Mod assist Gait Distance (Feet): 20 Feet Assistive device: Rolling walker (2 wheeled) Gait Pattern/deviations: Decreased dorsiflexion - left;Step-through pattern;Step-to pattern;Decreased stride length;Trunk flexed;Decreased step length - left;Leaning posteriorly;Steppage Gait velocity: decreased   General Gait Details: Slow, unsteady gait with assist for RW management/forward momentum of advancing LE; left foot drop which is chronic utilizing steppage technique to advance LLE. Fatigues quickly. Sp02 remained >91% on RA. 2/4 DOE.  Stairs            Wheelchair Mobility    Modified Rankin (Stroke Patients Only)       Balance Overall balance assessment: Needs assistance Sitting-balance support: No upper extremity supported;Feet supported Sitting balance-Leahy Scale: Fair     Standing balance support: Bilateral upper extremity supported;During functional activity Standing balance-Leahy Scale: Poor Standing balance comment: reliant on external support and RW.                              Pertinent Vitals/Pain Pain Assessment:  No/denies pain    Home Living Family/patient expects to be discharged to:: Private residence Living Arrangements: Spouse/significant other;Non-relatives/Friends Available Help at Discharge: Personal care attendant(5 days/wk 7 days week) Type of Home: House Home Access: Stairs to enter Entrance Stairs-Rails: Left Entrance Stairs-Number of Steps: 1 Home Layout: One level Home Equipment: Shower  seat;Transport chair;Hand held shower head;Cane - single point;Cane - quad;Walker - 4 wheels;Walker - 2 wheels Additional Comments: 2L home oxygen    Prior Function Level of Independence: Needs assistance   Gait / Transfers Assistance Needed: Walks with 4 wheeled walker Modif I  ADL's / Homemaking Assistance Needed: Aide/pt wife assists with bathing and dressing, pt wife assists with all IADL's         Hand Dominance   Dominant Hand: Right    Extremity/Trunk Assessment   Upper Extremity Assessment Upper Extremity Assessment: Defer to OT evaluation    Lower Extremity Assessment Lower Extremity Assessment: Generalized weakness    Cervical / Trunk Assessment Cervical / Trunk Assessment: Kyphotic  Communication   Communication: Expressive difficulties  Cognition Arousal/Alertness: Awake/alert Behavior During Therapy: WFL for tasks assessed/performed Overall Cognitive Status: History of cognitive impairments - at baseline Area of Impairment: Orientation                 Orientation Level: Disoriented to;Person;Place;Time;Situation             General Comments: Caregiver present in room and states pt at cognitive baseline. Follows simple 1 step commands consistently with repetition and increased time. HOH.      General Comments General comments (skin integrity, edema, etc.): Pt has condom cath on which was leaking a little.  Notified nursing    Exercises General Exercises - Lower Extremity Ankle Circles/Pumps: AROM;Both;10 reps;Seated Long Arc Quad: AROM;Both;10 reps;Seated Hip Flexion/Marching: AROM;Both;10 reps;Seated   Assessment/Plan    PT Assessment Patient needs continued PT services  PT Problem List Decreased strength;Decreased activity tolerance;Decreased balance;Decreased mobility;Decreased coordination;Decreased cognition       PT Treatment Interventions DME instruction;Gait training;Stair training;Functional mobility training;Therapeutic  activities;Balance training;Therapeutic exercise;Patient/family education    PT Goals (Current goals can be found in the Care Plan section)  Acute Rehab PT Goals Patient Stated Goal: none stated by pt; wife wants pt to go home PT Goal Formulation: With patient/family Time For Goal Achievement: 08/28/18 Potential to Achieve Goals: Good    Frequency Min 3X/week   Barriers to discharge        Co-evaluation               AM-PAC PT "6 Clicks" Mobility  Outcome Measure Help needed turning from your back to your side while in a flat bed without using bedrails?: A Little Help needed moving from lying on your back to sitting on the side of a flat bed without using bedrails?: A Little Help needed moving to and from a bed to a chair (including a wheelchair)?: A Lot Help needed standing up from a chair using your arms (e.g., wheelchair or bedside chair)?: A Lot Help needed to walk in hospital room?: A Lot Help needed climbing 3-5 steps with a railing? : A Lot 6 Click Score: 14    End of Session Equipment Utilized During Treatment: Gait belt;Oxygen(uses 2LO2 at night, Sats on RA 91% and above. ) Activity Tolerance: Patient limited by fatigue Patient left: in chair;with call bell/phone within reach;with family/visitor present Nurse Communication: Mobility status PT Visit Diagnosis: Unsteadiness on feet (R26.81);Muscle weakness (generalized) (M62.81);Difficulty in walking, not elsewhere classified (R26.2)  Time: 5929-2446 PT Time Calculation (min) (ACUTE ONLY): 23 min   Charges:     PT Treatments $Gait Training: 8-22 mins $Therapeutic Exercise: 8-22 mins        [place] Pager:  [phone]  Office:  [place] 08/14/2018, 9:57 AM

## 2018-08-14 NOTE — Evaluation (Signed)
Clinical/Bedside Swallow Evaluation Patient Details  Name: TRAYVOND VIETS MRN: [id number] Date of Birth: 05-May-1931  Today's Date: 08/14/2018 Time: SLP Start Time (ACUTE ONLY): 1457 SLP Stop Time (ACUTE ONLY): 1509 SLP Time Calculation (min) (ACUTE ONLY): 12 min  Past Medical History:  Past Medical History:  Diagnosis Date  . Allergic rhinitis   . Anxiety   . Basal cell carcinoma of face   . Cardiomyopathy ([place]) 04/2015   EF 40-45% by echo. Apical anterior akinesis seen on echo, not confirmed on Myoview was negative for ischemia. Myoview suggested a small basal inferoseptal defect.  . Cholecystitis    a. complex admit 04/2015 due to sepsis due to acute Escherichia coli cholecystitis and right lower lobe CAP and Citrobacter UTI.  [name] Chronic respiratory failure ([place])    a. on home O2 after discharge 04/2015.  [name] Chronic systolic CHF (congestive heart failure) ([place])    a. Dx 04/2015 during complex admission for cholecystitis - EF 40-45% +WMA.  [name] COPD (chronic obstructive pulmonary disease) ([place])   . Erectile dysfunction   . History of endovascular stent graft for abdominal aortic aneurysm 2002   a. s/p repair  Followed by Dr. Donnetta Hutching (stable evaluation 01/2106)  . History of hiatal hernia   . HTN (hypertension)   . Hyperlipidemia   . Interstitial lung disease ([place])    a. followed by pulm.  [name] LBBB (left bundle branch block)    a. Intermittent - seen in 2013, not in 09/2013, but again in 04/2015.  . Obesity   . On home oxygen therapy    "just at night" (10/05/2015)  . PVD (peripheral vascular disease) ([place])   . Shingles   . Stroke [facility])    a. H/o aphasia - was told he'd had ministrokes. Imaging 09/2014 revealed prior infarct.   Past Surgical History:  Past Surgical History:  Procedure Laterality Date  . AORTA SURGERY  2002  . CHOLECYSTECTOMY N/A 10/05/2015   Procedure: LAPAROSCOPIC CHOLECYSTECTOMY  ;  Surgeon: Autumn Messing III, MD;  Location: [place];  Service: General;  Laterality: N/A;  .  [place]  . INTRAOPERATIVE CHOLANGIOGRAM  10/05/2015   Procedure: INTRAOPERATIVE CHOLANGIOGRAM;  Surgeon: Autumn Messing III, MD;  Location: [place];  Service: General;;  . LAPAROSCOPIC CHOLECYSTECTOMY  10/05/2015  . NM MYOVIEW LTD  05/2015   EF 49%. Small size, mild severity perfusion defect in the basal-mid inferoseptal wall. LOW RISK. No ischemia. Defect thought to be related to LBBB artifacts and not true lesion. This could also explain mild systolic dysfunction.  . TRANSTHORACIC ECHOCARDIOGRAM  04/2015   (In setting of acute illness).EF 40-45% with mid-apical anteroseptal akinesis. Moderately increased PA pressures of roughly 48 mmHg.  [name] URETHRAL STRICTURE DILATATION  late 1970s   urinary tract stretch   HPI:  Patient is an 84 y.o. male admitted with several days of cough and shortness of breath thought to be related to COPD exacerbation. PMH includes COPD, dementia, GERD, CVA, HH, CHF, anxiety   Assessment / Plan / Recommendation Clinical Impression  Pt has no overt signs of aspiration across consistencies tested. He does have subtle signs of a cognitively-based oral dysphagia, with minimal oral residue for which he needed a liquid wash to clear. Pt likely would benefit from supervision during meals given his mentation, but diet changes do not seem warranted at this time. Would continue regular diet textures and thin liquids. Please reorder SLP if further difficulties arise.  SLP Visit Diagnosis: Dysphagia, unspecified (R13.10)  Aspiration Risk  Mild aspiration risk    Diet Recommendation Regular;Thin liquid   Liquid Administration via: Cup;Straw Medication Administration: Whole meds with puree(how pt takes them at home per chart review) Supervision: Patient able to self feed;Full supervision/cueing for compensatory strategies Compensations: Slow rate;Small sips/bites;Minimize environmental distractions Postural Changes: Seated upright at 90 degrees    Other  Recommendations  Oral Care Recommendations: Oral care BID   Follow up Recommendations 24 hour supervision/assistance      Frequency and Duration            Prognosis        Swallow Study   General HPI: Patient is an 84 y.o. male admitted with several days of cough and shortness of breath thought to be related to COPD exacerbation. PMH includes COPD, dementia, GERD, CVA, HH, CHF, anxiety Type of Study: Bedside Swallow Evaluation Previous Swallow Assessment: none in chart Diet Prior to this Study: Regular;Thin liquids Temperature Spikes Noted: No Respiratory Status: Nasal cannula History of Recent Intubation: No Behavior/Cognition: Alert;Cooperative;Pleasant mood;Confused;Requires cueing;Distractible Oral Cavity Assessment: Within Functional Limits Oral Care Completed by SLP: No Oral Cavity - Dentition: Dentures, top;Dentures, bottom(partials on bottom) Vision: Functional for self-feeding Self-Feeding Abilities: Able to feed self Patient Positioning: Upright in chair Baseline Vocal Quality: Normal Volitional Cough: Cognitively unable to elicit Volitional Swallow: Unable to elicit    Oral/Motor/Sensory Function Overall Oral Motor/Sensory Function: Within functional limits(appears WFL but has difficulty completing tasks to assess)   Ice Chips Ice chips: Not tested   Thin Liquid Thin Liquid: Within functional limits Presentation: Self Fed;Straw    Nectar Thick Nectar Thick Liquid: Not tested   Honey Thick Honey Thick Liquid: Not tested   Puree Puree: Within functional limits Presentation: Self Fed;Spoon   Solid     Solid: Within functional limits Presentation: Self [place] 08/14/2018,3:15 PM   Germain Osgood, M.A. [place] Acute Environmental education officer [phone] Office [phone]

## 2018-08-14 NOTE — Care Management Note (Signed)
Case Management Note  Patient Details  Name: CARTEL MAUSS MRN: [id number] Date of Birth: 1931-02-24  Subjective/Objective: Pt presented for COPD Exacerbation. PTA from home with support of family and a personal care aide. Pt was active with [facility], OT, Speech Therapy.                    Action/Plan: Dorian Pod with [facility] is aware that patient is hospitalized. If patient transitions to inpatient status pt will need HH orders. No further needs from CM at this time. Will continue to monitor.   Expected Discharge Date:                  Expected Discharge Plan:  [place]  In-House Referral:  NA  Discharge planning Services  CM Consult  Post Acute Care Choice:  Home Health Choice offered to:  Patient  DME Arranged:  N/A DME Agency:  NA  HH Arranged:  PT, OT, Speech Therapy HH Agency:  [facility]  Status of Service:  Completed, signed off  If discussed at [place] of Stay Meetings, dates discussed:    Additional Comments:  Bethena Roys, RN 08/14/2018, 3:17 PM

## 2018-08-15 DIAGNOSIS — E785 Hyperlipidemia, unspecified: Secondary | ICD-10-CM

## 2018-08-15 DIAGNOSIS — R5381 Other malaise: Secondary | ICD-10-CM

## 2018-08-15 DIAGNOSIS — I639 Cerebral infarction, unspecified: Secondary | ICD-10-CM

## 2018-08-15 MED ORDER — METHYLPREDNISOLONE SODIUM SUCC 125 MG IJ SOLR
60.0000 mg | Freq: Two times a day (BID) | INTRAMUSCULAR | Status: DC
  Administered 2018-08-16 (×2): 60 mg via INTRAVENOUS
  Filled 2018-08-15 (×3): qty 2

## 2018-08-15 MED ORDER — DM-GUAIFENESIN ER 30-600 MG PO TB12
2.0000 | ORAL_TABLET | Freq: Two times a day (BID) | ORAL | Status: DC
  Administered 2018-08-15 – 2018-08-16 (×3): 2 via ORAL
  Filled 2018-08-15 (×3): qty 2

## 2018-08-15 MED ORDER — IPRATROPIUM-ALBUTEROL 0.5-2.5 (3) MG/3ML IN SOLN
3.0000 mL | Freq: Two times a day (BID) | RESPIRATORY_TRACT | Status: DC
  Administered 2018-08-15 – 2018-08-16 (×2): 3 mL via RESPIRATORY_TRACT
  Filled 2018-08-15 (×2): qty 3

## 2018-08-15 NOTE — Progress Notes (Signed)
PROGRESS NOTE    Lee [name]  QQV:[id number] DOB: 03-24-1931 DOA: 08/13/2018 PCP: Lee Infante, MD   Brief Narrative:  HPI on 08/13/2018 by Dr. Shela Leff Lee [name] is a 84 y.o. male with medical history significant of chronic systolic congestive heart failure, COPD, interstitial lung disease, chronic respiratory failure on home oxygen, hypertension, hyperlipidemia, PVD, CVA presenting to the hospital for evaluation of cough. Patient was admitted in October for PE and is currently on Eliquis.  Wife at bedside states patient has been having a runny nose and coughing aggressively for the past 1 day.  He has also been wheezing.  States he was not as alert this morning.  She called his PCPs office and they were advised to come into the ED.  States patient has been coughing up sputum.  He uses 2 L oxygen at night.  Wife believes he was feeling warm this morning and she gave him Tylenol.  States patient has "cognitive problems" and is oriented to person and place only at baseline.  Patient denies having any chest pain or shortness of breath.  No further history could be obtained from him.  Interim history Admitted with COPD exacerbation.  Assessment & Plan   COPD exacerbation -Continues to have coughing spells and does not feel that he is at his baseline. -Continue IV steroids, azithromycin, Mucinex, antitussives, bronchodilators -Influenza PCR negative -Increase Mucinex dose, added flutter valve -Patient to ambulate with physical therapy, mobilize -Speech therapy consulted, recommended regular diet, thin liquid  History of PE -Continue Eliquis  History of IPF with chronic hypoxic respiratory failure -CXR reviewed and appears stable, unchanged from prior  -Follow-up with Dr. Chase [name] as an outpatient -Continue supplemental oxygen at night  Chronic diastolic heart failure -Stable, appears euvolemic -Echocardiogram 06/18/2018 LVEF appears normal -continue lasix  Essential  hypertension -Continue Coreg -BP stable  Dyslipidemia -Continue statin  GERD -Continue PPI  Dementia with cognitive dysfunction -Wife at bedside, acknowledging memory issues. -Feels patient is currently at his baseline  Deconditioning/debility -Suspect worsened by COPD -PT recommending home health physical therapy  DVT Prophylaxis  Eliquis  Code Status: Full  Family Communication: Wife at bedside  Disposition Plan: Admitted.  Suspect discharged home in 1 to 2 days with home health PT  Consultants none  Procedures  none  Antibiotics   Anti-infectives (From admission, onward)   Start     Dose/Rate Route Frequency Ordered Stop   08/14/18 2100  azithromycin (ZITHROMAX) tablet 250 mg     250 mg Oral Daily 08/13/18 2159     08/13/18 2200  azithromycin (ZITHROMAX) tablet 500 mg     500 mg Oral  Once 08/13/18 2159 08/13/18 2230      Subjective:   Lee [name] seen and examined today.  Patient with dementia.  Complains of cough.  Wife states his cough has not improved and he has shortness of breath from time to time.  Objective:   Vitals:   08/15/18 0026 08/15/18 0434 08/15/18 0812 08/15/18 0822  BP: (!) 152/78 (!) 154/94 (!) 161/77   Pulse: 88 83 86   Resp: (!) 25 (!) 21 (!) 25   Temp:  (!) 97.5 F (36.4 C) (!) 97.5 F (36.4 C)   TempSrc:  Oral Oral   SpO2: 97% 96% 90% 97%  Weight:  92.9 kg    Height:        Intake/Output Summary (Last 24 hours) at 08/15/2018 1400 Last data filed at 08/15/2018 0900 Gross per 24 hour  Intake 480 ml  Output 850 ml  Net -370 ml   Filed Weights   08/13/18 2131 08/14/18 0342 08/15/18 0434  Weight: 92.6 kg 92.6 kg 92.9 kg    Exam  General: Well developed, chronically ill appearing, NAD  HEENT: NCAT, mucous membranes moist.   Neck: Supple  Cardiovascular: S1 S2 auscultated, regular  Respiratory:  Diminished breath sounds, mild rhochi, cough  Abdomen: Soft, nontender, nondistended, + bowel sounds  Extremities:  warm dry without cyanosis clubbing or edema  Neuro: AAOx2, dementia, nonfocal  Psych: Normal affect and demeanor   Data Reviewed: I have personally reviewed following labs and imaging studies  CBC: Recent Labs  Lab 08/13/18 1513 08/14/18 0339  WBC 14.7* 8.7  NEUTROABS 10.3*  --   HGB 13.9 13.0  HCT 43.9 39.2  MCV 95.4 91.8  PLT 275 595   Basic Metabolic Panel: Recent Labs  Lab 08/13/18 1513  NA 136  K 4.4  CL 104  CO2 20*  GLUCOSE 104*  BUN 9  CREATININE 0.90  CALCIUM 8.8*   GFR: Estimated Creatinine Clearance: 64 mL/min (by C-G formula based on SCr of 0.9 mg/dL). Liver Function Tests: No results for input(s): AST, ALT, ALKPHOS, BILITOT, PROT, ALBUMIN in the last 168 hours. No results for input(s): LIPASE, AMYLASE in the last 168 hours. No results for input(s): AMMONIA in the last 168 hours. Coagulation Profile: No results for input(s): INR, PROTIME in the last 168 hours. Cardiac Enzymes: No results for input(s): CKTOTAL, CKMB, CKMBINDEX, TROPONINI in the last 168 hours. BNP (last 3 results) No results for input(s): PROBNP in the last 8760 hours. HbA1C: No results for input(s): HGBA1C in the last 72 hours. CBG: Recent Labs  Lab 08/14/18 1122  GLUCAP 130*   Lipid Profile: No results for input(s): CHOL, HDL, LDLCALC, TRIG, CHOLHDL, LDLDIRECT in the last 72 hours. Thyroid Function Tests: No results for input(s): TSH, T4TOTAL, FREET4, T3FREE, THYROIDAB in the last 72 hours. Anemia Panel: No results for input(s): VITAMINB12, FOLATE, FERRITIN, TIBC, IRON, RETICCTPCT in the last 72 hours. Urine analysis:    Component Value Date/Time   COLORURINE STRAW (A) 06/15/2018 1850   APPEARANCEUR CLEAR 06/15/2018 1850   LABSPEC 1.006 06/15/2018 1850   PHURINE 5.0 06/15/2018 1850   GLUCOSEU NEGATIVE 06/15/2018 1850   HGBUR NEGATIVE 06/15/2018 1850   BILIRUBINUR NEGATIVE 06/15/2018 1850   KETONESUR NEGATIVE 06/15/2018 1850   PROTEINUR NEGATIVE 06/15/2018 1850    UROBILINOGEN 1.0 04/30/2015 2123   NITRITE NEGATIVE 06/15/2018 1850   LEUKOCYTESUR TRACE (A) 06/15/2018 1850   Sepsis Labs: @LABRCNTIP (procalcitonin:4,lacticidven:4)  ) Recent Results (from the past 240 hour(s))  MRSA PCR Screening     Status: None   Collection Time: 08/13/18  9:21 PM  Result Value Ref Range Status   MRSA by PCR NEGATIVE NEGATIVE Final    Comment:        The GeneXpert MRSA Assay (FDA approved for NASAL specimens only), is one component of a comprehensive MRSA colonization surveillance program. It is not intended to diagnose MRSA infection nor to guide or monitor treatment for MRSA infections. Performed at [facility], [place] [address], [place], [place] [address]       Radiology Studies: Dg Chest 2 View  Result Date: 08/13/2018 CLINICAL DATA:  Short of breath. EXAM: CHEST - 2 VIEW COMPARISON:  08/13/2018 FINDINGS: Normal heart size. Decreased lung volumes. There is no pleural effusion or edema identified. Chronic coarsened interstitial markings are identified bilaterally. No superimposed airspace densities. IMPRESSION:  1. Chronic lung disease without superimposed acute cardiopulmonary abnormality. Electronically Signed   By: Lee Moors M.D.   On: 08/13/2018 14:28     Scheduled Meds: . apixaban  5 mg Oral BID WC  . azithromycin  250 mg Oral Daily  . carvedilol  12.5 mg Oral BID WC  . cholecalciferol  1,000 Units Oral Q supper  . clopidogrel  75 mg Oral Q1500  . dextromethorphan-guaiFENesin  2 tablet Oral BID  . ezetimibe  10 mg Oral Daily  . feeding supplement (ENSURE ENLIVE)  237 mL Oral Q1500  . furosemide  20 mg Oral Daily  . ipratropium-albuterol  3 mL Nebulization BID  . methylPREDNISolone (SOLU-MEDROL) injection  60 mg Intravenous Q8H  . multivitamin with minerals  1 tablet Oral Daily  . olopatadine  1 drop Left Eye QHS  . pantoprazole  40 mg Oral QAC breakfast  . pravastatin  20 mg Oral q1800   Continuous Infusions:   LOS: 1 day    Time Spent in minutes   30 minutes  Abbegail Matuska D.O. on 08/15/2018 at 2:00 PM  Between 7am to 7pm - Please see pager noted on [email or web address]  After 7pm go to [email or web address]  And look for the night coverage person covering for me after hours  [facility] Office  [phone]

## 2018-08-15 NOTE — Plan of Care (Signed)

## 2018-08-16 DIAGNOSIS — I2609 Other pulmonary embolism with acute cor pulmonale: Secondary | ICD-10-CM

## 2018-08-16 MED ORDER — PREDNISONE 20 MG PO TABS: ORAL_TABLET | ORAL | 0 refills | Status: DC

## 2018-08-16 MED ORDER — AZITHROMYCIN 250 MG PO TABS: 250.0000 mg | ORAL_TABLET | Freq: Every day | ORAL | 0 refills | Status: DC

## 2018-08-16 NOTE — Progress Notes (Signed)
Discharge instructions reviewed with pt,wife and personal sitter. They have no questions at this time. IV d/c. Sitter and wife stated they would schedule follow up appointments.

## 2018-08-16 NOTE — Care Management Note (Signed)
Case Management Note Initial CM Note documented by Jacqlyn Krauss RNCM  Patient Details  Name: Lee [name] MRN: [id number] Date of Birth: 10-05-1930  Subjective/Objective: Pt presented for COPD Exacerbation. PTA from home with support of family and a personal care aide. Pt was active with [facility] PT, OT, Speech Therapy.                    Action/Plan: Dorian Pod with [facility] is aware that patient is hospitalized. If patient transitions to inpatient status pt will need HH orders. No further needs from CM at this time. Will continue to monitor.   Expected Discharge Date:  08/16/18               Expected Discharge Plan:  [place]  In-House Referral:  NA  Discharge planning Services  CM Consult  Post Acute Care Choice:  Home Health Choice offered to:  Patient  DME Arranged:  N/A DME Agency:  NA  HH Arranged:  PT, OT, Speech Therapy [place] Agency:  [facility]  Status of Service:  Completed, signed off  If discussed at [place] of Stay Meetings, dates discussed:    Additional Comments: 08/16/18 @ 1542-Lee [name] RNCM-Patient will be transitioning home today. HH orders and F2F entered in Epic by attending. CM spoke to Lee [name], [facility] liaison to provide an update on the patients POC. No further needs from CM.   Midge Minium RN, BSN, NCM-BC, ACM-RN [phone] 08/16/2018, 3:41 PM

## 2018-08-16 NOTE — Discharge Instructions (Signed)
Chronic Obstructive Pulmonary Disease Exacerbation Chronic obstructive pulmonary disease (COPD) is a long-term (chronic) lung problem. In COPD, the flow of air from the lungs is limited. COPD exacerbations are times that breathing gets worse and you need more than your normal treatment. Without treatment, they can be life threatening. If they happen often, your lungs can become more damaged. If your COPD gets worse, your doctor may treat you with:  Medicines.  Oxygen.  Different ways to clear your airway, such as using a mask. Follow these instructions at home: Medicines  Take over-the-counter and prescription medicines only as told by your doctor.  If you take an antibiotic or steroid medicine, do not stop taking the medicine even if you start to feel better.  Keep up with shots (vaccinations) as told by your doctor. Be sure to get a yearly (annual) flu shot. Lifestyle  Do not smoke. If you need help quitting, ask your doctor.  Eat healthy foods.  Exercise regularly.  Get plenty of sleep.  Avoid tobacco smoke and other things that can bother your lungs.  Wash your hands often with soap and water. This will help keep you from getting an infection. If you cannot use soap and water, use hand sanitizer.  During flu season, avoid areas that are crowded with people. General instructions  Drink enough fluid to keep your pee (urine) clear or pale yellow. Do not do this if your doctor has told you not to.  Use a cool mist machine (vaporizer).  If you use oxygen or a machine that turns medicine into a mist (nebulizer), continue to use it as told.  Follow all instructions for rehabilitation. These are steps you can take to make your body work better.  Keep all follow-up visits as told by your doctor. This is important. Contact a doctor if:  Your COPD symptoms get worse than normal. Get help right away if:  You are short of breath and it gets worse.  You have trouble  talking.  You have chest pain.  You cough up blood.  You have a fever.  You keep throwing up (vomiting).  You feel weak or you pass out (faint).  You feel confused.  You are not able to sleep because of your symptoms.  You are not able to do daily activities. Summary  COPD exacerbations are times that breathing gets worse and you need more treatment than normal.  COPD exacerbations can be very serious and may cause your lungs to become more damaged.  Do not smoke. If you need help quitting, ask your doctor.  Stay up-to-date on your shots. Get a flu shot every year. This information is not intended to replace advice given to you by your health care provider. Make sure you discuss any questions you have with your health care provider. Document Released: 07/28/2011 Document Revised: 09/12/2016 Document Reviewed: 09/12/2016 Elsevier Interactive Patient Education  2019 [employer].  ================================================================================  Information on my medicine - ELIQUIS (apixaban)  Why was Eliquis prescribed for you? Eliquis was prescribed to treat blood clots that were found in the veins of your lungs (pulmonary embolism) in October 2019, and to reduce the risk of them occurring again.  What do You need to know about Eliquis ? Your dose is ONE 5 mg tablet taken TWICE daily.  Eliquis may be taken with or without food.   Try to take the dose about the same time in the morning and in the evening. If you have difficulty swallowing the tablet  whole please discuss with your pharmacist how to take the medication safely.  Take Eliquis exactly as prescribed and DO NOT stop taking Eliquis without talking to the doctor who prescribed the medication.  Stopping may increase your risk of developing a new blood clot.  Refill your prescription before you run out.  After discharge, you should have regular check-up appointments with your healthcare  provider that is prescribing your Eliquis.    What do you do if you miss a dose? If a dose of ELIQUIS is not taken at the scheduled time, take it as soon as possible on the same day and twice-daily administration should be resumed. The dose should not be doubled to make up for a missed dose.  Important Safety Information A possible side effect of Eliquis is bleeding. You should call your healthcare provider right away if you experience any of the following: ? Bleeding from an injury or your nose that does not stop. ? Unusual colored urine (red or dark brown) or unusual colored stools (red or black). ? Unusual bruising for unknown reasons. ? A serious fall or if you hit your head (even if there is no bleeding).  Some medicines may interact with Eliquis and might increase your risk of bleeding or clotting while on Eliquis. To help avoid this, consult your healthcare provider or pharmacist prior to using any new prescription or non-prescription medications, including herbals, vitamins, non-steroidal anti-inflammatory drugs (NSAIDs) and supplements.  This website has more information on Eliquis (apixaban): http://www.eliquis.com/eliquis/home

## 2018-08-16 NOTE — Discharge Summary (Addendum)
Physician Discharge Summary  Lee [name] BJY:[id number] DOB: 82/08/1930 DOA: 08/13/2018  PCP: [name], MD  Admit date: 08/13/2018 Discharge date: 08/16/2018  Time spent: 45 minutes  Recommendations for Outpatient Follow-up:  Patient will be discharged to home with home health physical therapy.  Patient will need to follow up with primary care provider within one week of discharge.  Patient should continue medications as prescribed.  Patient should follow a heart healthy diet.   Discharge Diagnoses:  COPD exacerbation History of PE History of IPF with chronic hypoxic respiratory failure Chronic diastolic heart failure Essential hypertension Dyslipidemia GERD Dementia with cognitive dysfunction Deconditioning/debility  Discharge Condition: Stable  Diet recommendation: heart healthy  Filed Weights   08/14/18 0342 08/15/18 0434 08/16/18 0554  Weight: 92.6 kg 92.9 kg 92.2 kg    History of present illness:  on 08/13/2018 by Dr. Leonard Schwartz a 84 y.o.malewith medical history significant ofchronic systolic congestive heart failure, COPD, interstitial lung disease, chronic respiratory failure on home oxygen, hypertension, hyperlipidemia, PVD, CVA presenting to the hospital for evaluation of cough.Patient was admitted in October for PE and is currently on Eliquis.Wife at bedside states patient has been having a runny nose and coughingaggressively for the past 1 day. He has also been wheezing. States he was not as alert this morning. She called his PCPs office and they were advised to come into the ED. States patient has been coughing up sputum. He uses 2 L oxygen at night. Wife believes he was feeling warm this morning and she gave him Tylenol. States patient has "cognitive problems"and is oriented to person and place only at baseline. Patient denies having any chest pain or shortness of breath. No further history could be obtained from  him.  Hospital Course:  COPD exacerbation -Continues to have coughing spells and does not feel that he is at his baseline. -was placed on IV steroids, azithromycin, Mucinex, antitussives, bronchodilators -Influenza PCR negative -Increase Mucinex dose, added flutter valve -Patient to ambulate with physical therapy, mobilize -Speech therapy consulted, recommended regular diet, thin liquid -will discharge with prednisone taper and azithromycin, continue home meds  History of PE -Continue Eliquis  History of IPF with chronic hypoxic respiratory failure -CXR reviewed and appears stable, unchanged from prior  -Follow-up with Dr. Chase Caller as an outpatient -Continue supplemental oxygen at night  Chronic diastolic heart failure -Stable, appears euvolemic -Echocardiogram 06/18/2018 LVEF appears normal -continue lasix  Essential hypertension -Continue Coreg -BP stable  Dyslipidemia -Continue statin  GERD -Continue PPI  Dementia with cognitive dysfunction -Wife at bedside, acknowledging memory issues. -Feels patient is currently at his baseline  Deconditioning/debility -Suspect worsened by COPD -PT recommending home health physical therapy  Procedures: None  Consultations: None  Discharge Exam: Vitals:   08/16/18 1037 08/16/18 1156  BP: 117/66 133/71  Pulse: 81 75  Resp: (!) 23 (!) 25  Temp:  97.6 F (36.4 C)  SpO2: 95% 96%     General: Well developed, elderly, chronically ill appearing, NAD  HEENT: NCAT, mucous membranes moist.  Neck: Supple  Cardiovascular: S1 S2 auscultated, regular  Respiratory: Clear to auscultation bilaterally with equal chest rise  Abdomen: Soft, nontender, nondistended, + bowel sounds  Extremities: warm dry without cyanosis clubbing or edema  Neuro: AAOx2, dementia, nonfocal  Psych: Normal affect and demeanor, pleasant   Discharge Instructions Discharge Instructions    Discharge instructions   Complete by:  As  directed    Patient will be discharged to home with home health physical  therapy.  Patient will need to follow up with primary care provider within one week of discharge.  Patient should continue medications as prescribed.  Patient should follow a heart healthy diet.     Allergies as of 08/16/2018      Reactions   Ciprofloxacin Other (See Comments)   Tingle feeling throughout body   Tape Other (See Comments)   Redness, Please use "paper" tape      Medication List    TAKE these medications   albuterol (2.5 MG/3ML) 0.083% nebulizer solution Commonly known as:  PROVENTIL Take 3 mLs (2.5 mg total) by nebulization every 6 (six) hours as needed for wheezing or shortness of breath. What changed:  when to take this   albuterol 108 (90 Base) MCG/ACT inhaler Commonly known as:  PROVENTIL HFA;VENTOLIN HFA Inhale 2 puffs into the lungs every 6 (six) hours as needed for wheezing or shortness of breath. What changed:  Another medication with the same name was changed. Make sure you understand how and when to take each.   apixaban 5 MG Tabs tablet Commonly known as:  ELIQUIS Take 1 tablet (5 mg total) by mouth 2 (two) times daily. What changed:  when to take this   azithromycin 250 MG tablet Commonly known as:  ZITHROMAX Take 1 tablet (250 mg total) by mouth daily.   carvedilol 12.5 MG tablet Commonly known as:  COREG TAKE 1 TABLET (12.5 MG TOTAL) BY MOUTH 2 (TWO) TIMES DAILY WITH A MEAL. What changed:    how much to take  how to take this  when to take this  additional instructions   cholecalciferol 1000 units tablet Commonly known as:  VITAMIN D Take 1 tablet (1,000 Units total) by mouth daily. What changed:  when to take this   clopidogrel 75 MG tablet Commonly known as:  PLAVIX Take 1 tablet (75 mg total) by mouth daily. What changed:  when to take this   ezetimibe 10 MG tablet Commonly known as:  ZETIA Take 1 tablet (10 mg total) by mouth daily.   feeding  supplement (ENSURE ENLIVE) Liqd Take 237 mLs by mouth 2 (two) times daily between meals. What changed:  when to take this   furosemide 20 MG tablet Commonly known as:  LASIX Take 1 tablet (20 mg total) by mouth every Monday, Wednesday, and Friday.   ipratropium-albuterol 0.5-2.5 (3) MG/3ML Soln Commonly known as:  DUONEB Take 3 mLs by nebulization every 6 (six) hours.   losartan 50 MG tablet Commonly known as:  COZAAR Take 1 tablet (50 mg total) by mouth daily.   MUCINEX MAXIMUM STRENGTH 1200 MG Tb12 Generic drug:  Guaifenesin Take 1,200 mg by mouth 2 (two) times daily. What changed:  Another medication with the same name was removed. Continue taking this medication, and follow the directions you see here.   multivitamin with minerals Tabs tablet Take 1 tablet by mouth daily.   nitroGLYCERIN 0.4 MG SL tablet Commonly known as:  NITROSTAT Place 1 tablet (0.4 mg total) under the tongue every 5 (five) minutes as needed for chest pain.   olopatadine 0.1 % ophthalmic solution Commonly known as:  PATANOL Place 1 drop into the left eye 2 (two) times daily. What changed:  when to take this   pantoprazole 40 MG tablet Commonly known as:  PROTONIX Take 1 tablet (40 mg total) by mouth daily. What changed:  when to take this   [place] Take 1 capsule by mouth daily.  Pitavastatin Calcium 2 MG Tabs Commonly known as:  LIVALO Take 1 tablet (2 mg total) by mouth See admin instructions. Every 3rd day What changed:    how much to take  when to take this  additional instructions   potassium chloride 10 MEQ CR capsule Commonly known as:  MICRO-K Take 1 capsule (10 mEq total) by mouth every Monday, Wednesday, and Friday. **Take with lasix**   predniSONE 20 MG tablet Commonly known as:  DELTASONE Take 3 tabs x 2 days, then take 2 tabs x 2 days, then 1 tab x 2 days.   Tiotropium Bromide-Olodaterol 2.5-2.5 MCG/ACT Aers Commonly known as:  STIOLTO  RESPIMAT Inhale 2 puffs into the lungs daily. What changed:  when to take this      Allergies  Allergen Reactions  . Ciprofloxacin Other (See Comments)    Tingle feeling throughout body  . Tape Other (See Comments)    Redness, Please use "paper" tape   [facility] Follow up.   Specialty:  Home Health Services Why:  Physical Therapy, Occupational Therapy, Speech Contact information: [place] [address] [phone]        [name], MD Follow up in 1 week(s).   Specialty:  Internal Medicine Why:  hospital follow up Contact information: [place] [address] [phone]        Brand Males, MD. Schedule an appointment as soon as possible for a visit in 1 week(s).   Specialty:  Pulmonary Disease Why:  Hospital follow up Contact information: [place] [address] [phone]            The results of significant diagnostics from this hospitalization (including imaging, microbiology, ancillary and laboratory) are listed below for reference.    Significant Diagnostic Studies: Dg Chest 2 View  Result Date: 08/13/2018 CLINICAL DATA:  Short of breath. EXAM: CHEST - 2 VIEW COMPARISON:  08/13/2018 FINDINGS: Normal heart size. Decreased lung volumes. There is no pleural effusion or edema identified. Chronic coarsened interstitial markings are identified bilaterally. No superimposed airspace densities. IMPRESSION: 1. Chronic lung disease without superimposed acute cardiopulmonary abnormality. Electronically Signed   By: Kerby Moors M.D.   On: 08/13/2018 14:28    Microbiology: Recent Results (from the past 240 hour(s))  MRSA PCR Screening     Status: None   Collection Time: 08/13/18  9:21 PM  Result Value Ref Range Status   MRSA by PCR NEGATIVE NEGATIVE Final    Comment:        The GeneXpert MRSA Assay (FDA approved for NASAL  specimens only), is one component of a comprehensive MRSA colonization surveillance program. It is not intended to diagnose MRSA infection nor to guide or monitor treatment for MRSA infections. Performed at [facility], [place] [address], [place], [place] [address]      Labs: Basic Metabolic Panel: Recent Labs  Lab 08/13/18 1513  NA 136  K 4.4  CL 104  CO2 20*  GLUCOSE 104*  BUN 9  CREATININE 0.90  CALCIUM 8.8*   Liver Function Tests: No results for input(s): AST, ALT, ALKPHOS, BILITOT, PROT, ALBUMIN in the last 168 hours. No results for input(s): LIPASE, AMYLASE in the last 168 hours. No results for input(s): AMMONIA in the last 168 hours. CBC: Recent Labs  Lab 08/13/18 1513 08/14/18 0339  WBC 14.7* 8.7  NEUTROABS 10.3*  --   HGB  13.9 13.0  HCT 43.9 39.2  MCV 95.4 91.8  PLT 275 255   Cardiac Enzymes: No results for input(s): CKTOTAL, CKMB, CKMBINDEX, TROPONINI in the last 168 hours. BNP: BNP (last 3 results) Recent Labs    06/15/18 1329 08/13/18 1513  BNP 141.4* 99.8    ProBNP (last 3 results) No results for input(s): PROBNP in the last 8760 hours.  CBG: Recent Labs  Lab 08/14/18 1122  GLUCAP 130*       Signed:  Keri Tavella  [facility] 08/16/2018, 1:38 PM

## 2018-08-23 NOTE — Progress Notes (Signed)
@Patient  ID: Lee [name], male    DOB: [birth year], 85 y.o.   MRN: [id number]  Chief Complaint  Patient presents with  . Hospitalization Follow-up    IPF / COPD     Referring provider: [name], MD  HPI:  85 year old former smoker followed in our office for IPF and emphysema   PMH: Stroke, PE (on eliquis), CHF, Cardiomyopathy, Chronic Cough Smoker/ Smoking History: Former Smoker. 25 pack year history Maintenance:   Stiolto Pt of: Dr. Chase [name]  08/24/2018  - Visit   85 year old male patient presenting today for hospital follow-up.  Patient was recently seen in the hospital for COPD exacerbation/IPF flare.  Since being discharged from the hospital patient spouse and daughter reported the patient has been doing better.  Patient has improved appetite, was able to participate in holiday activities, remains adherent to his Stiolto inhaler.  There requesting a prescription for the Stiolto.  They have not been able to weigh the patient in the last week as patient's caregiver is currently on vacation.  Needs that 2 people to help assist to stand.  Patient with lower extremity swelling and compression stockings applied today.  Overall they believe the patient is doing better but have concerns as patient has increased confusion today.   Tests:  01/26/2018- swallowing study-modified barium swallow- premature spill and delayed swallow trigger noted, no evidence of vestibular penetration or aspiration, please refer to speech pathologist report for complete details and recommendations, mild aspiration risk  07/28/2017-chest x-ray- changes of pulmonary fibrosis are again seen, no acute disease  09/17/2012-CT chest without contrast- diffuse bronchial wall thickening and moderate centrilobular and paraseptal emphysema, compatible with underlying COPD, some patchy areas of very mild peripheral groundglass attenuation, subpleural reticulation and peripheral bronchiectasis atelectasis could suggest  interstitial lung disease/NSIP  06/01/2017-echocardiogram- LV ejection fraction 50 to 82%, systolic function normal, grade 1 diastolic dysfunction  42/35/3614-ER Angio- small segmental subsegmental right lower lobe pulmonary emboli, moderate to severe emphysema, consolidation and groundglass density in the posterior right upper lobe consistent with pneumonia.  FENO:  No results found for: NITRICOXIDE  PFT: PFT Results Latest Ref Rng & Units 04/18/2014 09/19/2013  FVC-Pre L 3.10 3.06  FVC-Predicted Pre % 98 96  FVC-Post L 3.11 3.10  FVC-Predicted Post % 98 97  Pre FEV1/FVC % % 74 74  Post FEV1/FCV % % 76 69  FEV1-Pre L 2.28 2.28  FEV1-Predicted Pre % 103 102  FEV1-Post L 2.37 2.16  DLCO UNC% % 58 55  DLCO COR %Predicted % 77 80  TLC L 7.84 -  TLC % Predicted % 129 -  RV % Predicted % 165 -    Imaging: Dg Chest 2 View  Result Date: 08/13/2018 CLINICAL DATA:  Short of breath. EXAM: CHEST - 2 VIEW COMPARISON:  08/13/2018 FINDINGS: Normal heart size. Decreased lung volumes. There is no pleural effusion or edema identified. Chronic coarsened interstitial markings are identified bilaterally. No superimposed airspace densities. IMPRESSION: 1. Chronic lung disease without superimposed acute cardiopulmonary abnormality. Electronically Signed   By: Lee [name] M.D.   On: 08/13/2018 14:28      Specialty Problems      Pulmonary Problems   Dyspnea on exertion   Other emphysema (HCC)   Chronic cough   IPF (idiopathic pulmonary fibrosis) (HCC)   CAP (community acquired pneumonia)    06/15/2018-CT Angio- small segmental subsegmental right lower lobe pulmonary emboli, moderate to severe emphysema, consolidation and groundglass density in the posterior right upper lobe consistent  with pneumonia.      Aspiration pneumonia (HCC)   COPD exacerbation (HCC)      Allergies  Allergen Reactions  . Ciprofloxacin Other (See Comments)    Tingle feeling throughout body  . Tape Other (See  Comments)    Redness, Please use "paper" tape    Immunization History  Administered Date(s) Administered  . Influenza Split 06/22/2012, 06/22/2013, 06/03/2015  . Influenza, High Dose Seasonal PF 04/22/2016, 05/03/2017, 06/18/2018  . Influenza-Unspecified 06/22/2014  . Pneumococcal Polysaccharide-23 06/22/2010  . Tdap 09/22/2012    Past Medical History:  Diagnosis Date  . Allergic rhinitis   . Anxiety   . Basal cell carcinoma of face   . Cardiomyopathy ([place]) 04/2015   EF 40-45% by echo. Apical anterior akinesis seen on echo, not confirmed on Myoview was negative for ischemia. Myoview suggested a small basal inferoseptal defect.  . Cholecystitis    a. complex admit 04/2015 due to sepsis due to acute Escherichia coli cholecystitis and right lower lobe CAP and Citrobacter UTI.  [name] Chronic respiratory failure ([place])    a. on home O2 after discharge 04/2015.  [name] Chronic systolic CHF (congestive heart failure) ([place])    a. Dx 04/2015 during complex admission for cholecystitis - EF 40-45% +WMA.  [name] COPD (chronic obstructive pulmonary disease) ([place])   . Erectile dysfunction   . History of endovascular stent graft for abdominal aortic aneurysm 2002   a. s/p repair  Followed by Dr. Donnetta [name] (stable evaluation 01/2106)  . History of hiatal hernia   . HTN (hypertension)   . Hyperlipidemia   . Interstitial lung disease ([place])    a. followed by pulm.  [name] LBBB (left bundle branch block)    a. Intermittent - seen in 2013, not in 09/2013, but again in 04/2015.  . Obesity   . On home oxygen therapy    "just at night" (10/05/2015)  . PVD (peripheral vascular disease) ([place])   . Shingles   . Stroke [facility])    a. H/o aphasia - was told he'd had ministrokes. Imaging 09/2014 revealed prior infarct.    Tobacco History: Social History   Tobacco Use  Smoking Status Former Smoker  . Packs/day: 1.00  . Years: 25.00  . Pack years: 25.00  . Types: Cigarettes  . Last attempt to quit: 08/22/1974  . Years since  quitting: 44.0  Smokeless Tobacco Never Used   Counseling given: Yes   Outpatient Encounter Medications as of 08/24/2018  Medication Sig  . albuterol (PROVENTIL HFA;VENTOLIN HFA) 108 (90 Base) MCG/ACT inhaler Inhale 2 puffs into the lungs every 6 (six) hours as needed for wheezing or shortness of breath.  [name] albuterol (PROVENTIL) (2.5 MG/3ML) 0.083% nebulizer solution Take 3 mLs (2.5 mg total) by nebulization every 6 (six) hours as needed for wheezing or shortness of breath. (Patient taking differently: Take 2.5 mg by nebulization 2 (two) times daily. )  . apixaban (ELIQUIS) 5 MG TABS tablet Take 1 tablet (5 mg total) by mouth 2 (two) times daily. (Patient taking differently: Take 5 mg by mouth 2 (two) times daily with a meal. )  . carvedilol (COREG) 12.5 MG tablet TAKE 1 TABLET (12.5 MG TOTAL) BY MOUTH 2 (TWO) TIMES DAILY WITH A MEAL. (Patient taking differently: Take 12.5 mg by mouth 2 (two) times daily with a meal. )  . cholecalciferol (VITAMIN D) 1000 units tablet Take 1 tablet (1,000 Units total) by mouth daily. (Patient taking differently: Take 1,000 Units by mouth daily with supper. )  .  clopidogrel (PLAVIX) 75 MG tablet Take 1 tablet (75 mg total) by mouth daily. (Patient taking differently: Take 75 mg by mouth daily at 3 pm. )  . ezetimibe (ZETIA) 10 MG tablet Take 1 tablet (10 mg total) by mouth daily.  . feeding supplement, ENSURE ENLIVE, (ENSURE ENLIVE) LIQD Take 237 mLs by mouth 2 (two) times daily between meals. (Patient taking differently: Take 237 mLs by mouth daily at 3 pm. )  . furosemide (LASIX) 20 MG tablet Take 1 tablet (20 mg total) by mouth every Monday, Wednesday, and Friday.  . Guaifenesin (MUCINEX MAXIMUM STRENGTH) 1200 MG TB12 Take 1,200 mg by mouth 2 (two) times daily.  [name] ipratropium-albuterol (DUONEB) 0.5-2.5 (3) MG/3ML SOLN Take 3 mLs by nebulization every 6 (six) hours.  [name] losartan (COZAAR) 50 MG tablet Take 1 tablet (50 mg total) by mouth daily.  . Multiple Vitamin  (MULTIVITAMIN WITH MINERALS) TABS tablet Take 1 tablet by mouth daily.  . nitroGLYCERIN (NITROSTAT) 0.4 MG SL tablet Place 1 tablet (0.4 mg total) under the tongue every 5 (five) minutes as needed for chest pain.  [name] olopatadine (PATANOL) 0.1 % ophthalmic solution Place 1 drop into the left eye 2 (two) times daily. (Patient taking differently: Place 1 drop into the left eye at bedtime. )  . pantoprazole (PROTONIX) 40 MG tablet Take 1 tablet (40 mg total) by mouth daily. (Patient taking differently: Take 40 mg by mouth daily before breakfast. )  . Pitavastatin Calcium (LIVALO) 2 MG TABS Take 1 tablet (2 mg total) by mouth See admin instructions. Every 3rd day (Patient taking differently: Take 1 mg by mouth every 3 (three) days. )  . potassium chloride (MICRO-K) 10 MEQ CR capsule Take 1 capsule (10 mEq total) by mouth every Monday, Wednesday, and Friday. **Take with lasix**  . Probiotic Product ([place]) CAPS Take 1 capsule by mouth daily.  . Tiotropium Bromide-Olodaterol (STIOLTO RESPIMAT) 2.5-2.5 MCG/ACT AERS Inhale 2 puffs into the lungs daily. (Patient taking differently: Inhale 2 puffs into the lungs daily before lunch. )  . [DISCONTINUED] azithromycin (ZITHROMAX) 250 MG tablet Take 1 tablet (250 mg total) by mouth daily.  . [DISCONTINUED] predniSONE (DELTASONE) 20 MG tablet Take 3 tabs x 2 days, then take 2 tabs x 2 days, then 1 tab x 2 days.  . Tiotropium Bromide-Olodaterol (STIOLTO RESPIMAT) 2.5-2.5 MCG/ACT AERS Inhale 2 puffs into the lungs daily.  . Tiotropium Bromide-Olodaterol (STIOLTO RESPIMAT) 2.5-2.5 MCG/ACT AERS Inhale 2 puffs into the lungs daily for 1 day.   No facility-administered encounter medications on file as of 08/24/2018.     Review of Systems  Review of Systems  Constitutional: Positive for fatigue. Negative for activity change, chills, fever and unexpected weight change.  HENT: Negative for congestion, postnasal drip, sinus pressure, sinus pain and sore  throat.   Eyes: Negative.   Respiratory: Positive for cough (sounds productive yellow ) and wheezing (this morning, night time, laying flat). Negative for shortness of breath.        + increased hob d/t to cough   Cardiovascular: Positive for leg swelling. Negative for chest pain and palpitations.  Gastrointestinal: Negative for diarrhea, nausea and vomiting.  Endocrine: Negative.   Musculoskeletal: Negative.   Skin: Negative.   Neurological: Negative for dizziness and headaches.  Psychiatric/Behavioral: Positive for confusion (acute confusion today ). Negative for dysphoric mood. The patient is not nervous/anxious.   All other systems reviewed and are negative.    Physical Exam  BP 126/64 (BP Location:  Left Arm, Cuff Size: Normal)   Pulse 76   Ht 5\' 6"  (1.676 m)   Wt 206 lb (93.4 kg)   SpO2 93%   BMI 33.25 kg/m   Wt Readings from Last 5 Encounters:  08/24/18 206 lb (93.4 kg)  08/16/18 203 lb 4.2 oz (92.2 kg)  08/02/18 205 lb (93 kg)  07/13/18 202 lb (91.6 kg)  07/05/18 202 lb 12.8 oz (92 kg)    Physical Exam  Constitutional: He is oriented to person, place, and time and well-developed, well-nourished, and in no distress. No distress.  HENT:  Head: Normocephalic and atraumatic.  Right Ear: Hearing, tympanic membrane, external ear and ear canal normal.  Left Ear: Hearing, tympanic membrane, external ear and ear canal normal.  Nose: Mucosal edema present. Right sinus exhibits no maxillary sinus tenderness and no frontal sinus tenderness. Left sinus exhibits no maxillary sinus tenderness and no frontal sinus tenderness.  Mouth/Throat: Uvula is midline.  + Healing nasal mucosa  Eyes: Pupils are equal, round, and reactive to light.  Neck: Normal range of motion. Neck supple. No JVD present.  Cardiovascular: Normal rate, regular rhythm and normal heart sounds.  Pulmonary/Chest: Effort normal. No accessory muscle usage. No respiratory distress. He has decreased breath sounds (In  bases left greater than right). He has no wheezes. He has no rhonchi.  + Difficult assessment of lungs this patient was confused and unable to perform deep breaths halfway through exam  Musculoskeletal: Normal range of motion.        General: Edema (2-3+ lower extremity swelling with compression stockings applied) present.  Lymphadenopathy:    He has no cervical adenopathy.  Neurological: He is alert and oriented to person, place, and time. Gait normal.  Skin: Skin is warm and dry. He is not diaphoretic. No erythema.  + Bruising on extremities  Psychiatric: Mood and memory normal. He exhibits disordered thought content. He has a flat affect.  + Confusion during exam, patient found to humerus to not follow instructions.  Nursing note and vitals reviewed.     Lab Results:  CBC    Component Value Date/Time   WBC 8.7 08/14/2018 0339   RBC 4.27 08/14/2018 0339   HGB 13.0 08/14/2018 0339   HCT 39.2 08/14/2018 0339   PLT 255 08/14/2018 0339   MCV 91.8 08/14/2018 0339   MCH 30.4 08/14/2018 0339   MCHC 33.2 08/14/2018 0339   RDW 13.9 08/14/2018 0339   LYMPHSABS 2.4 08/13/2018 1513   MONOABS 1.2 (H) 08/13/2018 1513   EOSABS 0.6 (H) 08/13/2018 1513   BASOSABS 0.1 08/13/2018 1513    BMET    Component Value Date/Time   NA 136 08/13/2018 1513   NA 137 06/27/2018   K 4.4 08/13/2018 1513   CL 104 08/13/2018 1513   CO2 20 (L) 08/13/2018 1513   GLUCOSE 104 (H) 08/13/2018 1513   BUN 9 08/13/2018 1513   BUN 15 06/27/2018   CREATININE 0.90 08/13/2018 1513   CREATININE 1.02 09/07/2015 1701   CALCIUM 8.8 (L) 08/13/2018 1513   GFRNONAA >60 08/13/2018 1513   GFRAA >60 08/13/2018 1513    BNP    Component Value Date/Time   BNP 99.8 08/13/2018 1513    ProBNP No results found for: PROBNP    Assessment & Plan:   85 year old male patient completing hospital follow-up with our office today.  We will get baseline lab work as I have concerns that patient may be fluid overloaded.  Will  get patient scheduled to  see Dr. Chase [name] in 4 weeks.  We will have patient continue to complete CT angios as initially intended.  IPF (idiopathic pulmonary fibrosis) (HCC) Chest xray today   Labwork today   Stiolto Respimat inhaler >>>2 puffs daily >>>Take this no matter what >>>This is not a rescue inhaler  Albuterol nebs can be used 6 hours as needed for shortness of breathing  >>>contact our office if you do not have albuterol at home   Stop duo neb nebulizers   Complete CT Angio on 09/18/2018 09/18/2018 10:30 AM  at  [place]  Follow up in 4 week with Dr. Chase [name] first available preferrably in ILD clinic in 30 min slot  >>>reschedule prev Jan/2020 apt with Wyn Quaker FNP   Other emphysema Chest xray today   Labwork today   Stiolto Respimat inhaler >>>2 puffs daily >>>Take this no matter what >>>This is not a rescue inhaler  Albuterol nebs can be used 6 hours as needed for shortness of breathing  >>>contact our office if you do not have albuterol at home   Stop duo neb nebulizers   Complete CT Angio on 09/18/2018 09/18/2018 10:30 AM  at  [place] oxygen therapy as prescribed - at night >>>maintain oxygen saturations greater than 88 percent  >>>if unable to maintain oxygen saturations please contact the office  >>>do not smoke with oxygen  >>>can use nasal saline gel or nasal saline rinses to moisturize nose if oxygen causes dryness  Follow up in 4 week with Dr. Chase [name] first available preferrably in ILD clinic in 30 min slot  >>>reschedule prev Jan/2020 apt with Wyn Quaker FNP   Chronic systolic CHF (congestive heart failure) ([place]) Chest xray today   Labwork today   Follow up in 4 week with Dr. Chase [name] first available preferrably in ILD clinic in 30 min slot  >>>reschedule prev Jan/2020 apt with Wyn Quaker FNP   Pulmonary emboli [facility]) Continue Eliquis as  prescribed Continue Plavix as prescribed  Complete CT Angio on 09/18/2018 09/18/2018 10:30 AM  at  [place]  Follow up in 4 week with Dr. Chase [name] first available preferrably in ILD clinic in 30 min slot  >>>reschedule prev Jan/2020 apt with Wyn Quaker FNP   Altered mental status Chest xray today   Labwork today   Family to continue to monitor patient's mental status.  They report that he is close to baseline at this time.  We will check CO2 on blood work today.  Follow up in 4 week with Dr. Chase [name] first available preferrably in ILD clinic in 30 min slot  >>>reschedule prev Jan/2020 apt with [place], NP 08/24/2018   This appointment was 42 minutes long with over 50% of the time in direct face-to-face patient care, assessment, plan of care, and follow-up.

## 2018-08-24 ENCOUNTER — Encounter: Payer: Self-pay | Admitting: Pulmonary Disease

## 2018-08-24 ENCOUNTER — Ambulatory Visit (INDEPENDENT_AMBULATORY_CARE_PROVIDER_SITE_OTHER)
Admission: RE | Admit: 2018-08-24 | Discharge: 2018-08-24 | Disposition: A | Discharge status: Home / Self Care | Payer: Medicare Other | Source: Ambulatory Visit | Attending: Pulmonary Disease | Admitting: Pulmonary Disease

## 2018-08-24 ENCOUNTER — Ambulatory Visit: Payer: Medicare Other | Admitting: Pulmonary Disease

## 2018-08-24 ENCOUNTER — Ambulatory Visit
Admission: RE | Admit: 2018-08-24 | Discharge: 2018-08-24 | Disposition: A | Discharge status: Home / Self Care | Payer: Medicare Other | Source: Ambulatory Visit | Attending: Pulmonary Disease | Admitting: Pulmonary Disease

## 2018-08-24 VITALS — BP 126/64 | HR 76 | Ht 66.0 in | Wt 206.0 lb

## 2018-08-24 DIAGNOSIS — R0602 Shortness of breath: Secondary | ICD-10-CM | POA: Diagnosis not present

## 2018-08-24 DIAGNOSIS — J181 Lobar pneumonia, unspecified organism: Secondary | ICD-10-CM | POA: Diagnosis not present

## 2018-08-24 DIAGNOSIS — J189 Pneumonia, unspecified organism: Secondary | ICD-10-CM

## 2018-08-24 DIAGNOSIS — J84112 Idiopathic pulmonary fibrosis: Secondary | ICD-10-CM

## 2018-08-24 DIAGNOSIS — J438 Other emphysema: Secondary | ICD-10-CM | POA: Diagnosis not present

## 2018-08-24 DIAGNOSIS — I5022 Chronic systolic (congestive) heart failure: Secondary | ICD-10-CM

## 2018-08-24 DIAGNOSIS — R400 Somnolence: Secondary | ICD-10-CM

## 2018-08-24 DIAGNOSIS — I2609 Other pulmonary embolism with acute cor pulmonale: Secondary | ICD-10-CM

## 2018-08-24 LAB — COMPREHENSIVE METABOLIC PANEL
ALT: 42 U/L (ref 0–53)
AST: 20 U/L (ref 0–37)
Albumin: 3.4 g/dL — ABNORMAL LOW (ref 3.5–5.2)
Alkaline Phosphatase: 54 U/L (ref 39–117)
BUN: 19 mg/dL (ref 6–23)
CO2: 28 mEq/L (ref 19–32)
Calcium: 8.3 mg/dL — ABNORMAL LOW (ref 8.4–10.5)
Chloride: 102 mEq/L (ref 96–112)
Creatinine, Ser: 1.04 mg/dL (ref 0.40–1.50)
GFR: 72.01 mL/min (ref >60.00)
Glucose, Bld: 81 mg/dL (ref 70–99)
Potassium: 4.4 mEq/L (ref 3.5–5.1)
Sodium: 136 mEq/L (ref 135–145)
Total Bilirubin: 0.6 mg/dL (ref 0.2–1.2)
Total Protein: 6 g/dL (ref 6.0–8.3)

## 2018-08-24 LAB — BRAIN NATRIURETIC PEPTIDE: Pro B Natriuretic peptide (BNP): 39 pg/mL (ref 0.0–100.0)

## 2018-08-24 MED ORDER — TIOTROPIUM BROMIDE-OLODATEROL 2.5-2.5 MCG/ACT IN AERS: 2.0000 | INHALATION_SPRAY | Freq: Every day | RESPIRATORY_TRACT | 6 refills | Status: DC

## 2018-08-24 MED ORDER — TIOTROPIUM BROMIDE-OLODATEROL 2.5-2.5 MCG/ACT IN AERS: 2.0000 | INHALATION_SPRAY | Freq: Every day | RESPIRATORY_TRACT | 0 refills | Status: AC

## 2018-08-24 NOTE — Assessment & Plan Note (Signed)
Chest xray today   Labwork today   Family to continue to monitor patient's mental status.  They report that he is close to baseline at this time.  We will check CO2 on blood work today.  Follow up in 4 week with Dr. Chase Caller first available preferrably in ILD clinic in 30 min slot  >>>reschedule prev Jan/2020 apt with Wyn Quaker FNP

## 2018-08-24 NOTE — Assessment & Plan Note (Signed)
Chest xray today   Labwork today   Stiolto Respimat inhaler >>>2 puffs daily >>>Take this no matter what >>>This is not a rescue inhaler  Albuterol nebs can be used 6 hours as needed for shortness of breathing  >>>contact our office if you do not have albuterol at home   Stop duo neb nebulizers   Complete CT Angio on 09/18/2018 09/18/2018 10:30 AM  at  [place]  Follow up in 4 week with Dr. Chase Caller first available preferrably in ILD clinic in 30 min slot  >>>reschedule prev Jan/2020 apt with Wyn Quaker FNP

## 2018-08-24 NOTE — Patient Instructions (Addendum)
Chest xray today   Labwork today   Stiolto Respimat inhaler >>>2 puffs daily >>>Take this no matter what >>>This is not a rescue inhaler  Albuterol nebs can be used 6 hours as needed for shortness of breathing  >>>contact our office if you do not have albuterol at home   Stop duo neb nebulizers   Complete CT Angio on 09/18/2018 09/18/2018 10:30 AM  at  [place]    Follow up in 4 week with Dr. Chase Caller first available preferrably in ILD clinic in 30 min slot  >>>reschedule prev Jan/2020 apt with Wyn Quaker FNP   It is flu season:   >>>Remember to be washing your hands regularly, using hand sanitizer, be careful to use around herself with has contact with people who are sick will increase her chances of getting sick yourself. >>> Best ways to protect herself from the flu: Receive the yearly flu vaccine, practice good hand hygiene washing with soap and also using hand sanitizer when available, eat a nutritious meals, get adequate rest, hydrate appropriately   Please contact the office if your symptoms worsen or you have concerns that you are not improving.   Thank you for choosing [facility] for your healthcare, and for allowing [place] to partner with you on your healthcare journey. I am thankful to be able to provide care to you today.   Wyn Quaker FNP-C

## 2018-08-24 NOTE — Assessment & Plan Note (Addendum)
Chest xray today   Labwork today   Stiolto Respimat inhaler >>>2 puffs daily >>>Take this no matter what >>>This is not a rescue inhaler  Albuterol nebs can be used 6 hours as needed for shortness of breathing  >>>contact our office if you do not have albuterol at home   Stop duo neb nebulizers   May need to consider nebulized medications of the patient is not able to get enough of an inspiratory pull for the Stiolto Respimat. >>> Patient spouse, caregiver, daughter to monitor patient's intake of the Stiolto Respimat and follow-up with our office if the believe the patient's not getting enough of the medication.  Complete CT Angio on 09/18/2018 09/18/2018 10:30 AM  at  [place]  Continue oxygen therapy as prescribed - at night >>>maintain oxygen saturations greater than 88 percent  >>>if unable to maintain oxygen saturations please contact the office  >>>do not smoke with oxygen  >>>can use nasal saline gel or nasal saline rinses to moisturize nose if oxygen causes dryness  Follow up in 4 week with Dr. Chase Caller first available preferrably in ILD clinic in 30 min slot  >>>reschedule prev Jan/2020 apt with Wyn Quaker FNP

## 2018-08-24 NOTE — Assessment & Plan Note (Addendum)
Continue Eliquis as prescribed Continue Plavix as prescribed  Complete CT Angio on 09/18/2018 09/18/2018 10:30 AM  at  [place]  Follow up in 4 week with Dr. Chase Caller first available preferrably in ILD clinic in 30 min slot  >>>reschedule prev Jan/2020 apt with Wyn Quaker FNP

## 2018-08-24 NOTE — Progress Notes (Signed)
Chest x-ray today actually shows some improvement compared to December/23/2019 chest x-ray.  We are still waiting on blood work.  There is no pleural effusion which is 1 of the items we are concerned with.  Keep follow-up with our office.  We will call you once we have the lab results.  Wyn Quaker, FNP

## 2018-08-24 NOTE — Assessment & Plan Note (Signed)
Chest xray today   Labwork today   Follow up in 4 week with Dr. Chase Caller first available preferrably in ILD clinic in 30 min slot  >>>reschedule prev Jan/2020 apt with Wyn Quaker FNP

## 2018-08-28 ENCOUNTER — Telehealth: Payer: Self-pay | Admitting: Internal Medicine

## 2018-08-28 NOTE — Telephone Encounter (Signed)
Called and spoke with patients wife, ok per DPR from 2014. Given patients information regarding results from [place]. Patients wife stated that she is POA of the patient and she has this information. Advised patient's wife that she can always bring a copy of this to our office so that we have it. Nothing further needed. Patients wife stated they will come and bring a copy as well as sign an updated DPR. Nothing further needed.

## 2018-08-28 NOTE — Telephone Encounter (Signed)
See phone note from earlier today with patients wife, also copied below.  Conversation: Results  (Newest Message First)  Me       08/28/18 11:52 AM  Note    Called and spoke with patients wife, ok per DPR from 2014. Given patients information regarding results from [place]. Patients wife stated that she is POA of the patient and she has this information. Advised patient's wife that she can always bring a copy of this to our office so that we have it. Nothing further needed. Patients wife stated they will come and bring a copy as well as sign an updated DPR. Nothing further needed.          08/28/18 10:44 AM  McGough, Sandria Manly, NT routed this conversation to [facility] Triage Pool  ___________________________________________________________________________________________________  Hulen Skains and spoke with patients wife Enid Derry. She stated that she was confused as to why I was hesitant on giving her information on the patients lab work. Advised patients wife that when I looked into the chart I saw that she was listed as his emergency contact however on the recent DPR from 2017 the patient did not list any names as to whom we could give information to. When I looked further into the chart I saw a DPR from 2014 with [facility] name on it. When I spoke with supervisor Norva Karvonen to double check she stated that the DPR from 2014 was ok to use and I could give the information on the lab results to the patients wife. Enid Derry also stated that she was POA and she had that paperwork but she never gave the copy to [place]. Advised patients wife that at patients next OV they could sign a new DPR to make it current or she can bring in a copy of the POA so that there are no future questions on giving her information on the patient. While on the phone with Enid Derry she wanted me to review the patients labs again. Read over lab results from [name] again and advised that labs were good, albumin has decreased from where it was  in October. Enid Derry verbalized understanding. Nothing further needed.

## 2018-08-29 ENCOUNTER — Other Ambulatory Visit: Payer: Self-pay | Admitting: Internal Medicine

## 2018-09-18 ENCOUNTER — Ambulatory Visit (INDEPENDENT_AMBULATORY_CARE_PROVIDER_SITE_OTHER)
Admission: RE | Admit: 2018-09-18 | Discharge: 2018-09-18 | Disposition: A | Discharge status: Home / Self Care | Payer: Medicare Other | Source: Ambulatory Visit | Attending: Pulmonary Disease | Admitting: Pulmonary Disease

## 2018-09-18 DIAGNOSIS — J84112 Idiopathic pulmonary fibrosis: Secondary | ICD-10-CM

## 2018-09-18 DIAGNOSIS — I2609 Other pulmonary embolism with acute cor pulmonale: Secondary | ICD-10-CM

## 2018-09-18 MED ORDER — IOPAMIDOL (ISOVUE-370) INJECTION 76%
100.0000 mL | Freq: Once | INTRAVENOUS | Status: AC | PRN
  Administered 2018-09-18: 100 mL via INTRAVENOUS

## 2018-09-18 NOTE — Progress Notes (Signed)
CT angio come back.  No evidence of a pulmonary embolus.  This is good news.  Keep follow-up with our office.  No further changes at this time.  Wyn Quaker, NP

## 2018-09-25 ENCOUNTER — Other Ambulatory Visit: Payer: Self-pay | Admitting: Internal Medicine

## 2018-09-27 ENCOUNTER — Encounter: Payer: Self-pay | Admitting: Internal Medicine

## 2018-09-27 ENCOUNTER — Telehealth: Payer: Self-pay | Admitting: Internal Medicine

## 2018-09-27 ENCOUNTER — Ambulatory Visit: Payer: Medicare Other | Admitting: Internal Medicine

## 2018-09-27 VITALS — BP 112/60 | HR 64 | Ht 66.0 in | Wt 209.2 lb

## 2018-09-27 DIAGNOSIS — J8410 Pulmonary fibrosis, unspecified: Secondary | ICD-10-CM | POA: Diagnosis not present

## 2018-09-27 DIAGNOSIS — J439 Emphysema, unspecified: Secondary | ICD-10-CM | POA: Diagnosis not present

## 2018-09-27 DIAGNOSIS — R05 Cough: Secondary | ICD-10-CM | POA: Diagnosis not present

## 2018-09-27 DIAGNOSIS — J849 Interstitial pulmonary disease, unspecified: Secondary | ICD-10-CM

## 2018-09-27 DIAGNOSIS — R053 Chronic cough: Secondary | ICD-10-CM

## 2018-09-27 MED ORDER — ARFORMOTEROL TARTRATE 15 MCG/2ML IN NEBU: 15.0000 ug | INHALATION_SOLUTION | Freq: Two times a day (BID) | RESPIRATORY_TRACT | 6 refills | Status: DC

## 2018-09-27 MED ORDER — REVEFENACIN 175 MCG/3ML IN SOLN: 3.0000 mL | Freq: Every day | RESPIRATORY_TRACT | 6 refills | Status: DC

## 2018-09-27 NOTE — Patient Instructions (Addendum)
ICD-10-CM   1. Pulmonary emphysema with fibrosis of lung ([place]) J43.9    J84.10   2. ILD (interstitial lung disease) ([place]) J84.9   3. Chronic cough R05     Stable but with significant cough Dementia and physical disability slightly worse  Plan - supportive care - continue o2 at night  - we can see if cough will get better with nebulizer  - stop stiolto  - start yupelri nebulizer daily  - start brovana nebulizer daily   - use albuterol neb as needed   - price these out  Followup  32mnths to regular clinic (not ILD clinic); sooner if needed

## 2018-09-27 NOTE — Telephone Encounter (Signed)
Called and spoke with pt's wife Lee [name]. Per Lee [name], the brovana neb sol is going to require a PA and the Maretta Bees is too expensive even after insurance coverage.  I stated to Lee [name] that we would do the PA once we receive the PA request and stated to her that I would try to send the Yupelri to DME to see if cost would be better. Lee [name] expressed understanding. Rx sent to [facility]. Nothing further needed.

## 2018-09-27 NOTE — Telephone Encounter (Signed)
Called and spoke with Lee [name].  She requested OV notes, demographics, and med list to be faxed to [phone].  Fax sent and confirmation received.  Nothing further at this time.

## 2018-09-27 NOTE — Progress Notes (Signed)
OV 07/25/2016  Chief Complaint  Patient presents with  . Follow-up    pt states his breathing is unchanged since last OV. Pt c/o prod cough with light yellow mucus - unchanged since last OV. Pt denies CP./tightness.     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 In the interim he's had his cataract surgery uneventfully. He does not like to take medications. In the past he has decline anti-fibrotic therapy. He presents with his wife as usual. Overall pulmonary health is stable. It appears that he has some memory issues and is going to be started on exelon Patch. He uses nocturnal 02. He is on spiriva though not on MAR. Wife says he is compliant with it. He is wondring of about portable o2 for travel . Walking desat test 07/25/16 - 185 feet x 3 laps on RA: slow walk and fatigued. Lowest pulse ox 94% on forehead prlbe  Upteodate with flu shot +    OV 01/23/2017  Chief Complaint  Patient presents with  . Follow-up    Pt states his breathing is unchanged since last OV. Pt c/o prod cough with light yellow mucus. Pt denies CP/tightness and f/c/s.     follow-up combined emphysema with intestitial lung disease initial diagnosis of IPF   this is a six-month follow-up. He is not on antibiotic terapy. He is only on Spiriva. His wife reports for the last 1 month is increased chest tightness and congestion with increased yellow sputum. But he is denying any problems. This no wheezing. Both of them agree that there is no increased wheezing. They're tryingto make him walk a little bit more than usual.     OV 07/24/2017  Chief Complaint  Patient presents with  . Follow-up    Pt's wife stated that pt had bronchitis. Pt went to the ED last night 07/23/17 and dx was COPD. Pt's wife states that she believes he had a UTI due to running a fever. Pt is currently on cephalexin. Pt's wife states that today is a much better day  than yesterday due to pt unable to walk yesterday at the ED   FU combined emphysema/fibrosis  Follow-up combined emphysema pulmonary fibrosis on supportive care. Wife is here with him. For the last 2-3 days he's had increased cough with congestion and yellow sputum worse than baseline with deep cough. There is no increased wheezing or shortness of breath. Associated with this she started developing jerky left leg and pain which usually reflects associated urinary tract infection. Went to the emergency room yesterday was started on cephalexin. It is also having some delirium. He is now completely improved. Slowly getting better. Wife does not think there is a role for prednisone. He is up-to-date with his flu shot.   OV 01/25/18  Chief Complaint  Patient presents with  . Follow-up    per wife increased wheezing, has not been using inhalers. Reports cough is minimal.     Follow-up combined emphysema with pulmonary fibrosis on basic supportive care for pulmonary fibrosis  Wife is here with him.  I personally not seen him in a year.  6 months ago he saw my colleague Dr. Vaughan Browner.  Wife tells me that over time he is insidious worsening of cough with wheezing and mucus production that is clear to yellow.  Shortness of breath could be worse but then he is also more deconditioned is needing a cane and  is needing significant assistance at home.  They are going to get a caretaker come in and visit with him 4 times a week.  There is no acute decompensation or subacute decompensation.  COPD CAT score is 23 showing significant amount of symptomatology.  He has also had new onset dysphagia for the last few to several months.  He is having a swallow study ordered by primary care physician.  Wife is wondering whether he should restart himself on inhalers.  Apparently Spiriva worked in the past.  He is more in favor of inhalers as opposed to nebulizers which take time and is of increased frequency.        OV  05/07/2018  Subjective:  Patient ID: [name], male , DOB: [birth year] , age 85 y.o. , MRN: [id number] , ADDRESS: [address] [name] [facility] [id number]   05/07/2018 -   Chief Complaint  Patient presents with  . Follow-up    Pt went to see PCP x4 weeks ago and after having a cxr was told to have pna. Pt has had complaints of cellulitis. States breathing is about the same as last visit. Does have complaints of a cough with yellow mucus.   Follow-up combined emphysema and IPF  HPI Janelle H Ury 85 y.o. -Presents with his wife. Wife tells me that overall he is stable although maybe 6 weeks ago a routine chest x-ray at primary care office showed pneumonia and he was given cephalexin. After that for his chronic pedal edema he developed left lower extremity cellulitis and was given doxycycline. Despite this his chronic cough with yellow sputum remains unchanged. Overall he is less mobile. He has chronic pedal edema. He is requiring increased assistance. He spends time on the wheelchair. His ECOG is 4. His dementia slightly progressive. Apparently verbalization is difficult. He does use inhaler stiolto hoarse, and emphysemaand this is helpful. Has CAT score is unchanged    07/10/2018  - Visit   85 year old presenting for [facility] today.  Patient presents today with his caregiver who is with him 5 days a week.  Patient is pleasantly confused this is his baseline.  Patient reporting that patient is back to baseline.  With baseline fatigue and baseline shortness of breath.  Patient has working with physical therapy, Occupational Therapy, and speech therapy.  Patient is back to his regular diet.  Patient is with walking about 3 blocks a day with a Rollator walker with his caregiver.  They report he has not had any fevers, chills, increased cough, or wheezing.  Patient has not resumed Stiolto Respimat inhaler as patient is currently receiving duo nebs every 6 hours while he is at the [facility].  He reports peer planning on discharging the patient over the next couple of days.  Patient is back to 1 using 2 L of O2 at night.  Patient caregiver would like to have him evaluated today to see if he needs oxygen during the day.  Patient is on room air and oxygen saturations are 94%.  Patient caregiver also interested in getting a nebulizer nebulized medications at home.  This was the plan prior to patient being admitted to the hospital for right lower lobe pneumonia.     Tests:   01/26/2018- swallowing study-modified barium swallow- premature spill and delayed swallow trigger noted, no evidence of vestibular penetration or aspiration, please refer to speech pathologist report for complete details and recommendations, mild aspiration risk 07/28/2017-chest x-ray- changes of pulmonary  fibrosis are again seen, no acute disease 09/17/2012-CT chest without contrast- diffuse bronchial wall thickening and moderate centrilobular and paraseptal emphysema, compatible with underlying COPD, some patchy areas of very mild peripheral groundglass attenuation, subpleural reticulation and peripheral bronchiectasis atelectasis could suggest interstitial lung disease/NSIP 06/01/2017-echocardiogram- LV ejection fraction 50 to 37%, systolic function normal, grade 1 diastolic dysfunction  10/62/6948-NI Angio- small segmental subsegmental right lower lobe pulmonary emboli, moderate to severe emphysema, consolidation and groundglass density in the posterior right upper lobe consistent with pneumonia.    08/24/2018  - Visit   85 year old male patient presenting today for hospital follow-up.  Patient was recently seen in the hospital for COPD exacerbation/IPF flare.  Since being discharged from the hospital patient spouse and daughter reported the patient has been doing better.  Patient has improved appetite, was able to participate in holiday activities, remains adherent to his Stiolto inhaler.  There  requesting a prescription for the Stiolto.  They have not been able to weigh the patient in the last week as patient's caregiver is currently on vacation.  Needs that 2 people to help assist to stand.  Patient with lower extremity swelling and compression stockings applied today.  Overall they believe the patient is doing better but have concerns as patient has increased confusion today.   OV 09/27/2018  Subjective:  Patient ID: [name], male , DOB: 01-17-1931 , age 50 y.o. , MRN: [id number] , ADDRESS: [address] [name] [facility] [id number]   09/27/2018 -   Chief Complaint  Patient presents with  . Follow-up    Pt states breathing is about the same since last visit, has occ coughing. Denies any complaints of chest pain or chest tightness.   Follow-up combined emphysema with ILD [2014 CT with NSIP pattern] clinically deemed IPF because of combination disease.  Also has hiatal hernia.  On supportive care   HPI Jaquise H Noga 85 y.o. -since I last saw him he is significantly more deconditioned.  He has a caregiver now with him.  He can change his clothes with assistance.  He has significant cough.  Dyspnea is reflected below.  He has got more confusion and dementia.  His ECOG is 4.  He uses oxygen at night.  At this point in time goal of care is supportive and palliative in nature.  The main issue appears cough.  He has been admitted to the hospital in the interim.  He is on Stiolto but the caregiver feels nebulizer would help him.  His wife is willing to price out the newer nebulizers.  Of note, and his most recent CT chest January 2020 CT angiogram no comment of ILD was made but this could be a contrast effect.  Because I do hear crackles     SYMPTOM SCALE - 09/27/2018   O2 use At night  Shortness of Breath 0 -> 5 scale with 5 being worst (score 6 If unable to do due to dyspnea)  At rest 2  Simple tasks - showers, clothes change, eating, shaving 2 with assist  Household (dishes, doing  bed, laundry) disabled  Shopping disabled  Walking level at own pace 2  Walking keeping up with others of same age disabled  Walking up Stairs disabled  Walking up [employer]  Total (40 - 48) Dyspnea Score 6  How bad is your cough? 5  How bad is your fatigue 2        ROS - per HPI     has a past  medical history of Allergic rhinitis, Anxiety, Basal cell carcinoma of face, Cardiomyopathy ([place]) (04/2015), Cholecystitis, Chronic respiratory failure (HCC), Chronic systolic CHF (congestive heart failure) ([place]), COPD (chronic obstructive pulmonary disease) ([place]), Erectile dysfunction, History of endovascular stent graft for abdominal aortic aneurysm (2002), History of hiatal hernia, HTN (hypertension), Hyperlipidemia, Interstitial lung disease ([place]), LBBB (left bundle branch block), Obesity, On home oxygen therapy, PVD (peripheral vascular disease) ([place]), Shingles, and Stroke ([place]).   reports that he quit smoking about 44 years ago. His smoking use included cigarettes. He has a 25.00 pack-year smoking history. He has never used smokeless tobacco.  Past Surgical History:  Procedure Laterality Date  . AORTA SURGERY  2002  . CHOLECYSTECTOMY N/A 10/05/2015   Procedure: LAPAROSCOPIC CHOLECYSTECTOMY  ;  Surgeon: Autumn Messing III, MD;  Location: [facility];  Service: General;  Laterality: N/A;  . [place]  . INTRAOPERATIVE CHOLANGIOGRAM  10/05/2015   Procedure: INTRAOPERATIVE CHOLANGIOGRAM;  Surgeon: Autumn Messing III, MD;  Location: [facility];  Service: General;;  . LAPAROSCOPIC CHOLECYSTECTOMY  10/05/2015  . NM MYOVIEW LTD  05/2015   EF 49%. Small size, mild severity perfusion defect in the basal-mid inferoseptal wall. LOW RISK. No ischemia. Defect thought to be related to LBBB artifacts and not true lesion. This could also explain mild systolic dysfunction.  . TRANSTHORACIC ECHOCARDIOGRAM  04/2015   (In setting of acute illness).EF 40-45% with mid-apical anteroseptal akinesis. Moderately increased  PA pressures of roughly 48 mmHg.  [name] URETHRAL STRICTURE DILATATION  late 1970s   urinary tract stretch    Allergies  Allergen Reactions  . Ciprofloxacin Other (See Comments)    Tingle feeling throughout body  . Tape Other (See Comments)    Redness, Please use "paper" tape    Immunization History  Administered Date(s) Administered  . Influenza Split 06/22/2012, 06/22/2013, 06/03/2015  . Influenza, High Dose Seasonal PF 04/22/2016, 05/03/2017, 06/18/2018  . Influenza-Unspecified 06/22/2014  . Pneumococcal Polysaccharide-23 06/22/2010  . Tdap 09/22/2012    Family History  Problem Relation Age of Onset  . Hypertension Mother   . Stroke Father   . Heart disease Father   . Colon cancer Brother      Current Outpatient Medications:  .  albuterol (PROVENTIL HFA;VENTOLIN HFA) 108 (90 Base) MCG/ACT inhaler, Inhale 2 puffs into the lungs every 6 (six) hours as needed for wheezing or shortness of breath., Disp: 1 Inhaler, Rfl: 0 .  albuterol (PROVENTIL) (2.5 MG/3ML) 0.083% nebulizer solution, Take 3 mLs (2.5 mg total) by nebulization every 6 (six) hours as needed for wheezing or shortness of breath. (Patient taking differently: Take 2.5 mg by nebulization 2 (two) times daily. ), Disp: 360 mL, Rfl: 12 .  apixaban (ELIQUIS) 5 MG TABS tablet, Take 1 tablet (5 mg total) by mouth 2 (two) times daily. (Patient taking differently: Take 5 mg by mouth 2 (two) times daily with a meal. ), Disp: 60 tablet, Rfl: 0 .  carvedilol (COREG) 12.5 MG tablet, TAKE 1 TABLET (12.5 MG TOTAL) BY MOUTH 2 (TWO) TIMES DAILY WITH A MEAL. (Patient taking differently: Take 12.5 mg by mouth 2 (two) times daily with a meal. ), Disp: 180 tablet, Rfl: 3 .  cholecalciferol (VITAMIN D) 1000 units tablet, Take 1 tablet (1,000 Units total) by mouth daily. (Patient taking differently: Take 1,000 Units by mouth daily with supper. ), Disp: 30 tablet, Rfl: 0 .  clopidogrel (PLAVIX) 75 MG tablet, Take 1 tablet (75 mg total) by mouth  daily. (Patient taking differently: Take  75 mg by mouth daily at 3 pm. ), Disp: 30 tablet, Rfl: 5 .  ezetimibe (ZETIA) 10 MG tablet, Take 1 tablet (10 mg total) by mouth daily., Disp: 30 tablet, Rfl: 0 .  feeding supplement, ENSURE ENLIVE, (ENSURE ENLIVE) LIQD, Take 237 mLs by mouth 2 (two) times daily between meals. (Patient taking differently: Take 237 mLs by mouth daily at 3 pm. ), Disp: 237 mL, Rfl: 12 .  furosemide (LASIX) 20 MG tablet, Take 1 tablet (20 mg total) by mouth every Monday, Wednesday, and Friday., Disp: 30 tablet, Rfl: 6 .  Guaifenesin (MUCINEX MAXIMUM STRENGTH) 1200 MG TB12, Take 1,200 mg by mouth 2 (two) times daily., Disp: , Rfl:  .  losartan (COZAAR) 50 MG tablet, Take 1 tablet (50 mg total) by mouth daily., Disp: 30 tablet, Rfl: 0 .  Multiple Vitamin (MULTIVITAMIN WITH MINERALS) TABS tablet, Take 1 tablet by mouth daily., Disp: 30 tablet, Rfl: 0 .  nitroGLYCERIN (NITROSTAT) 0.4 MG SL tablet, Place 1 tablet (0.4 mg total) under the tongue every 5 (five) minutes as needed for chest pain., Disp: 30 tablet, Rfl: 0 .  olopatadine (PATANOL) 0.1 % ophthalmic solution, Place 1 drop into the left eye 2 (two) times daily. (Patient taking differently: Place 1 drop into the left eye at bedtime. ), Disp: 5 mL, Rfl: 0 .  pantoprazole (PROTONIX) 40 MG tablet, Take 1 tablet (40 mg total) by mouth daily. (Patient taking differently: Take 40 mg by mouth daily before breakfast. ), Disp: 30 tablet, Rfl: 10 .  Pitavastatin Calcium (LIVALO) 2 MG TABS, Take 1 tablet (2 mg total) by mouth See admin instructions. Every 3rd day (Patient taking differently: Take 1 mg by mouth every 3 (three) days. ), Disp: 30 tablet, Rfl: 0 .  potassium chloride (MICRO-K) 10 MEQ CR capsule, Take 1 capsule (10 mEq total) by mouth every Monday, Wednesday, and Friday. **Take with lasix**, Disp: 14 capsule, Rfl: 5 .  Probiotic Product ([place]) CAPS, Take 1 capsule by mouth daily., Disp: 30 capsule, Rfl: 0 .   arformoterol (BROVANA) 15 MCG/2ML NEBU, Take 2 mLs (15 mcg total) by nebulization 2 (two) times daily., Disp: 120 mL, Rfl: 6 .  Revefenacin (YUPELRI) 175 MCG/3ML SOLN, Inhale 3 mLs into the lungs daily., Disp: 120 mL, Rfl: 6      Objective:   Vitals:   09/27/18 1054  BP: 112/60  Pulse: 64  SpO2: 94%  Weight: 209 lb 3.2 oz (94.9 kg)  Height: 5\' 6"  (1.676 m)    Estimated body mass index is 33.77 kg/m as calculated from the following:   Height as of this encounter: 5\' 6"  (1.676 m).   Weight as of this encounter: 209 lb 3.2 oz (94.9 kg).  @WEIGHTCHANGE @  [employer]   09/27/18 1054  Weight: 209 lb 3.2 oz (94.9 kg)     Physical Exam  General Appearance:    Alert, cooperative, no distress, appears stated age - older , Deconditioned looking - yes , OBESE  - ues, Sitting on Wheelchair -  yes  Head:    Normocephalic, without obvious abnormality, atraumatic  Eyes:    PERRL, conjunctiva/corneas clear,  Ears:    Normal TM's and external ear canals, both ears  Nose:   Nares normal, septum midline, mucosa normal, no drainage    or sinus tenderness. OXYGEN ON  - no . Patient is @ ra   Throat:   Lips, mucosa, and tongue normal; teeth and gums normal. Cyanosis on lips -  no  Neck:   Supple, symmetrical, trachea midline, no adenopathy;    thyroid:  no enlargement/tenderness/nodules; no carotid   bruit or JVD  Back:     Symmetric, no curvature, ROM normal, no CVA tenderness  Lungs:     Distress - no , Wheeze no, Barrell Chest - no, Purse lip breathing - no, Crackles - YES at base   Chest Wall:    No tenderness or deformity.    Heart:    Regular rate and rhythm, S1 and S2 normal, no rub   or gallop, Murmur - no  Breast Exam:    NOT DONE  Abdomen:     Soft, non-tender, bowel sounds active all four quadrants,    no masses, no organomegaly. Visceral obesity - yes  Genitalia:   NOT DONE  Rectal:   NOT DONE  Extremities:   Extremities - normal, Has Cane - no, Clubbing - no, Edema - no    Pulses:   2+ and symmetric all extremities  Skin:   Stigmata of Connective Tissue Disease - no  Lymph nodes:   Cervical, supraclavicular, and axillary nodes normal  Psychiatric:  Neurologic:   Pleasant - yes, Anxious - no, Flat affect -   YES Presently pleasantly  confused           Assessment:       ICD-10-CM   1. Pulmonary emphysema with fibrosis of lung ([place]) J43.9    J84.10   2. ILD (interstitial lung disease) ([place]) J84.9   3. Chronic cough R05        Plan:     Patient Instructions     ICD-10-CM   1. Pulmonary emphysema with fibrosis of lung ([place]) J43.9    J84.10   2. ILD (interstitial lung disease) ([place]) J84.9   3. Chronic cough R05     Stable but with significant cough Dementia and physical disability slightly worse  Plan - supportive care - continue o2 at night  - we can see if cough will get better with nebulizer  - stop stiolto  - start yupelri nebulizer daily  - start brovana nebulizer daily   - use albuterol neb as needed   - price these out  Followup  65mnths to regular clinic (not ILD clinic); sooner if needed     SIGNATURE    Dr. Brand Males, M.D., F.C.C.P,  Pulmonary and Critical Care Medicine Staff Physician, [place] Director - Interstitial Lung Disease  Program  Pulmonary [place] at [place], [place], [address]  Pager: [phone], If no answer or between  15:00h - 7:00h: call [phone] Telephone: [phone]  11:18 AM 09/27/2018

## 2018-10-09 ENCOUNTER — Encounter (HOSPITAL_COMMUNITY): Payer: Self-pay

## 2018-10-09 ENCOUNTER — Emergency Department (HOSPITAL_COMMUNITY): Payer: Medicare Other

## 2018-10-09 ENCOUNTER — Emergency Department (HOSPITAL_COMMUNITY)
Admission: EM | Admit: 2018-10-09 | Discharge: 2018-10-09 | Disposition: A | Discharge status: Home / Self Care | Payer: Medicare Other | Attending: Emergency Medicine | Admitting: Emergency Medicine

## 2018-10-09 ENCOUNTER — Other Ambulatory Visit: Payer: Self-pay

## 2018-10-09 DIAGNOSIS — I110 Hypertensive heart disease with heart failure: Secondary | ICD-10-CM | POA: Insufficient documentation

## 2018-10-09 DIAGNOSIS — Y92008 Other place in unspecified non-institutional (private) residence as the place of occurrence of the external cause: Secondary | ICD-10-CM | POA: Diagnosis not present

## 2018-10-09 DIAGNOSIS — W010XXA Fall on same level from slipping, tripping and stumbling without subsequent striking against object, initial encounter: Secondary | ICD-10-CM

## 2018-10-09 DIAGNOSIS — Y929 Unspecified place or not applicable: Secondary | ICD-10-CM | POA: Insufficient documentation

## 2018-10-09 DIAGNOSIS — J449 Chronic obstructive pulmonary disease, unspecified: Secondary | ICD-10-CM | POA: Diagnosis not present

## 2018-10-09 DIAGNOSIS — Z87891 Personal history of nicotine dependence: Secondary | ICD-10-CM | POA: Diagnosis not present

## 2018-10-09 DIAGNOSIS — S0990XA Unspecified injury of head, initial encounter: Secondary | ICD-10-CM | POA: Diagnosis present

## 2018-10-09 DIAGNOSIS — Z7902 Long term (current) use of antithrombotics/antiplatelets: Secondary | ICD-10-CM | POA: Diagnosis not present

## 2018-10-09 DIAGNOSIS — S0003XA Contusion of scalp, initial encounter: Secondary | ICD-10-CM | POA: Diagnosis not present

## 2018-10-09 DIAGNOSIS — Y999 Unspecified external cause status: Secondary | ICD-10-CM | POA: Insufficient documentation

## 2018-10-09 DIAGNOSIS — F0390 Unspecified dementia without behavioral disturbance: Secondary | ICD-10-CM | POA: Diagnosis not present

## 2018-10-09 DIAGNOSIS — W01198A Fall on same level from slipping, tripping and stumbling with subsequent striking against other object, initial encounter: Secondary | ICD-10-CM | POA: Diagnosis not present

## 2018-10-09 DIAGNOSIS — Z7901 Long term (current) use of anticoagulants: Secondary | ICD-10-CM | POA: Insufficient documentation

## 2018-10-09 DIAGNOSIS — Y9389 Activity, other specified: Secondary | ICD-10-CM | POA: Insufficient documentation

## 2018-10-09 DIAGNOSIS — I5042 Chronic combined systolic (congestive) and diastolic (congestive) heart failure: Secondary | ICD-10-CM | POA: Diagnosis not present

## 2018-10-09 DIAGNOSIS — Z79899 Other long term (current) drug therapy: Secondary | ICD-10-CM | POA: Insufficient documentation

## 2018-10-09 NOTE — Discharge Instructions (Addendum)
It was our pleasure to provide your ER care today - we hope that you feel better.  Use great care to avoid falling, given your blood thinner therapy.   Return to ER if worse, new symptoms, new or severe pain, change in mental status, persistent vomiting, other concern.

## 2018-10-09 NOTE — ED Notes (Signed)
Patient and family verbalized understanding of discharge instructions. Opportunity for questioning and answers were provided. Armband removed by staff, pt discharged from ED.

## 2018-10-09 NOTE — ED Provider Notes (Signed)
[facility] EMERGENCY DEPARTMENT Provider Note   CSN: [id number] Arrival date & time: 10/09/18  1954    History   Chief Complaint Chief Complaint  Patient presents with  . Head Injury  . Fall    HPI Lee [name] is a 85 y.o. male.     Patient s/p fall at home. Tripped/lost balance when trying to sit at table, fell back, hit head. No loc. Is on plavix and eliquis. Denies faintness or dizziness. Pt limited historian - level 5 caveat, dementia. Denies headache. No neck or back pain. Denies other pain or injury. Family states recent health c/w baseline, and that pts mental status appears c/w baseline post fall. No vomiting.   The history is provided by the patient, a relative, the spouse and the EMS personnel. The history is limited by the condition of the patient.  Head Injury  Associated symptoms: no headaches and no vomiting   Fall  Pertinent negatives include no headaches.    Past Medical History:  Diagnosis Date  . Allergic rhinitis   . Anxiety   . Basal cell carcinoma of face   . Cardiomyopathy ([place]) 04/2015   EF 40-45% by echo. Apical anterior akinesis seen on echo, not confirmed on Myoview was negative for ischemia. Myoview suggested a small basal inferoseptal defect.  . Cholecystitis    a. complex admit 04/2015 due to sepsis due to acute Escherichia coli cholecystitis and right lower lobe CAP and Citrobacter UTI.  [name] Chronic respiratory failure ([place])    a. on home O2 after discharge 04/2015.  [name] Chronic systolic CHF (congestive heart failure) ([place])    a. Dx 04/2015 during complex admission for cholecystitis - EF 40-45% +WMA.  [name] COPD (chronic obstructive pulmonary disease) ([place])   . Erectile dysfunction   . History of endovascular stent graft for abdominal aortic aneurysm 2002   a. s/p repair  Followed by Dr. Donnetta Hutching (stable evaluation 01/2106)  . History of hiatal hernia   . HTN (hypertension)   . Hyperlipidemia   . Interstitial lung disease ([place])      a. followed by pulm.  [name] LBBB (left bundle branch block)    a. Intermittent - seen in 2013, not in 09/2013, but again in 04/2015.  . Obesity   . On home oxygen therapy    "just at night" (10/05/2015)  . PVD (peripheral vascular disease) ([place])   . Shingles   . Stroke [facility])    a. H/o aphasia - was told he'd had ministrokes. Imaging 09/2014 revealed prior infarct.    Patient Active Problem List   Diagnosis Date Noted  . Chronic systolic CHF (congestive heart failure) ([place]) 08/14/2018  . Physical deconditioning 08/14/2018  . COPD exacerbation ([place]) 08/13/2018  . History of recent pneumonia 07/10/2018  . Chronic diastolic (congestive) heart failure ([place]) 06/23/2018  . Pulmonary emboli ([place]) 06/15/2018  . Aspiration pneumonia ([place]) 06/15/2018  . Altered mental status 06/15/2018  . Chest pain 05/30/2017  . Stroke ([place])   . UTI (urinary tract infection) 05/02/2015  . Cardiomyopathy, nonischemic (HCC) - EF 40-45% by echo, but nearly 50% by Myoview. Anterior akinesis seen on echo not seen on Myoview. 05/02/2015  . Sepsis ([place]) 04/30/2015  . Essential hypertension 04/30/2015  . Abdominal pain 04/30/2015  . Hyperlipidemia with target LDL less than 100 04/30/2015  . CAP (community acquired pneumonia)   . IPF (idiopathic pulmonary fibrosis) ([place]) 10/22/2014  . Chronic cough 10/22/2014  . GERD (gastroesophageal reflux disease) 02/05/2013  .  Other emphysema ([place]) 10/23/2012  . Idiopathic pulmonary fibrosis, high clinical pretest probability, declined surgical lung biopsy 09/13/2012  . Dyspnea on exertion 08/03/2012  . Hypercholesterolemia 08/03/2012  . Hypertensive heart disease with CHF ([place]) 08/03/2012  . H/O abdominal aortic aneurysm repair 08/03/2012  . Ventral hernia 08/03/2012    Past Surgical History:  Procedure Laterality Date  . AORTA SURGERY  2002  . CHOLECYSTECTOMY N/A 10/05/2015   Procedure: LAPAROSCOPIC CHOLECYSTECTOMY  ;  Surgeon: Autumn Messing III, MD;  Location: [facility];   Service: General;  Laterality: N/A;  . [place]  . INTRAOPERATIVE CHOLANGIOGRAM  10/05/2015   Procedure: INTRAOPERATIVE CHOLANGIOGRAM;  Surgeon: Autumn Messing III, MD;  Location: [facility];  Service: General;;  . LAPAROSCOPIC CHOLECYSTECTOMY  10/05/2015  . NM MYOVIEW LTD  05/2015   EF 49%. Small size, mild severity perfusion defect in the basal-mid inferoseptal wall. LOW RISK. No ischemia. Defect thought to be related to LBBB artifacts and not true lesion. This could also explain mild systolic dysfunction.  . TRANSTHORACIC ECHOCARDIOGRAM  04/2015   (In setting of acute illness).EF 40-45% with mid-apical anteroseptal akinesis. Moderately increased PA pressures of roughly 48 mmHg.  [name] URETHRAL STRICTURE DILATATION  late 1970s   urinary tract stretch        Home Medications    Prior to Admission medications   Medication Sig Start Date End Date Taking? Authorizing Provider  albuterol (PROVENTIL HFA;VENTOLIN HFA) 108 (90 Base) MCG/ACT inhaler Inhale 2 puffs into the lungs every 6 (six) hours as needed for wheezing or shortness of breath. 07/13/18   Hennie Duos, MD  albuterol (PROVENTIL) (2.5 MG/3ML) 0.083% nebulizer solution Take 3 mLs (2.5 mg total) by nebulization every 6 (six) hours as needed for wheezing or shortness of breath. Patient taking differently: Take 2.5 mg by nebulization 2 (two) times daily.  07/13/18   Hennie Duos, MD  apixaban (ELIQUIS) 5 MG TABS tablet Take 1 tablet (5 mg total) by mouth 2 (two) times daily. Patient taking differently: Take 5 mg by mouth 2 (two) times daily with a meal.  07/13/18   Hennie Duos, MD  arformoterol (BROVANA) 15 MCG/2ML NEBU Take 2 mLs (15 mcg total) by nebulization 2 (two) times daily. 09/27/18   Brand Males, MD  carvedilol (COREG) 12.5 MG tablet TAKE 1 TABLET (12.5 MG TOTAL) BY MOUTH 2 (TWO) TIMES DAILY WITH A MEAL. Patient taking differently: Take 12.5 mg by mouth 2 (two) times daily with a meal.  07/13/18   Hennie Duos, MD  cholecalciferol (VITAMIN D) 1000 units tablet Take 1 tablet (1,000 Units total) by mouth daily. Patient taking differently: Take 1,000 Units by mouth daily with supper.  07/13/18   Hennie Duos, MD  clopidogrel (PLAVIX) 75 MG tablet Take 1 tablet (75 mg total) by mouth daily. Patient taking differently: Take 75 mg by mouth daily at 3 pm.  07/13/18   Hennie Duos, MD  ezetimibe (ZETIA) 10 MG tablet Take 1 tablet (10 mg total) by mouth daily. 07/13/18   Hennie Duos, MD  feeding supplement, ENSURE ENLIVE, (ENSURE ENLIVE) LIQD Take 237 mLs by mouth 2 (two) times daily between meals. Patient taking differently: Take 237 mLs by mouth daily at 3 pm.  07/13/18   Hennie Duos, MD  furosemide (LASIX) 20 MG tablet Take 1 tablet (20 mg total) by mouth every Monday, Wednesday, and Friday. 07/13/18   Hennie Duos, MD  Guaifenesin [facility] MAXIMUM STRENGTH) 1200 MG  TB12 Take 1,200 mg by mouth 2 (two) times daily.    [provider]  losartan (COZAAR) 50 MG tablet Take 1 tablet (50 mg total) by mouth daily. 07/13/18   Hennie Duos, MD  Multiple Vitamin (MULTIVITAMIN WITH MINERALS) TABS tablet Take 1 tablet by mouth daily. 07/13/18   Hennie Duos, MD  nitroGLYCERIN (NITROSTAT) 0.4 MG SL tablet Place 1 tablet (0.4 mg total) under the tongue every 5 (five) minutes as needed for chest pain. 07/13/18   Hennie Duos, MD  olopatadine (PATANOL) 0.1 % ophthalmic solution Place 1 drop into the left eye 2 (two) times daily. Patient taking differently: Place 1 drop into the left eye at bedtime.  07/13/18   Hennie Duos, MD  pantoprazole (PROTONIX) 40 MG tablet Take 1 tablet (40 mg total) by mouth daily. Patient taking differently: Take 40 mg by mouth daily before breakfast.  07/13/18   Hennie Duos, MD  Pitavastatin Calcium (LIVALO) 2 MG TABS Take 1 tablet (2 mg total) by mouth See admin instructions. Every 3rd day Patient taking differently: Take 1 mg by  mouth every 3 (three) days.  07/13/18   Hennie Duos, MD  potassium chloride (MICRO-K) 10 MEQ CR capsule Take 1 capsule (10 mEq total) by mouth every Monday, Wednesday, and Friday. **Take with lasix** 07/13/18   Hennie Duos, MD  Probiotic Product ([place]) CAPS Take 1 capsule by mouth daily. 07/13/18   Hennie Duos, MD  Revefenacin (YUPELRI) 175 MCG/3ML SOLN Inhale 3 mLs into the lungs daily. 09/27/18   Brand Males, MD    Family History Family History  Problem Relation Age of Onset  . Hypertension Mother   . Stroke Father   . Heart disease Father   . Colon cancer Brother     Social History Social History   Tobacco Use  . Smoking status: Former Smoker    Packs/day: 1.00    Years: 25.00    Pack years: 25.00    Types: Cigarettes    Last attempt to quit: 08/22/1974    Years since quitting: 44.1  . Smokeless tobacco: Never Used  Substance Use Topics  . Alcohol use: No  . Drug use: No     Allergies   Ciprofloxacin and Tape   Review of Systems Review of Systems  Unable to perform ROS: Dementia  Constitutional: Negative for fever.  Gastrointestinal: Negative for vomiting.  Neurological: Negative for headaches.  level 5 caveat, dementia   Physical Exam Updated Vital Signs BP (!) 167/74 (BP Location: Right Arm)   Pulse 82   Temp 97.7 F (36.5 C) (Oral)   Resp (!) 22   Ht 1.676 m (5\' 6" )   Wt 93.9 kg   SpO2 94%   BMI 33.41 kg/m   Physical Exam Vitals signs and nursing note reviewed.  Constitutional:      Appearance: Normal appearance. He is well-developed.  HENT:     Head:     Comments: Contusion posterior scalp. Skin intact.     Nose: Nose normal.     Mouth/Throat:     Mouth: Mucous membranes are moist.     Pharynx: Oropharynx is clear.  Eyes:     General: No scleral icterus.    Conjunctiva/sclera: Conjunctivae normal.     Pupils: Pupils are equal, round, and reactive to light.  Neck:     Musculoskeletal: Normal range of  motion and neck supple. No neck rigidity or muscular tenderness.  Vascular: No carotid bruit.     Trachea: No tracheal deviation.  Cardiovascular:     Rate and Rhythm: Normal rate and regular rhythm.     Pulses: Normal pulses.     Heart sounds: Normal heart sounds. No murmur. No friction rub. No gallop.   Pulmonary:     Effort: Pulmonary effort is normal. No accessory muscle usage or respiratory distress.     Breath sounds: Normal breath sounds.  Abdominal:     General: Bowel sounds are normal. There is no distension.     Palpations: Abdomen is soft.     Tenderness: There is no abdominal tenderness. There is no guarding.     Comments: No bruising/contusion.   Genitourinary:    Comments: No cva tenderness. Musculoskeletal:        General: No swelling.     Comments: CTLS spine, non tender, aligned, no step off. Good rom bil ext without pain or focal bony tenderness.   Skin:    General: Skin is warm and dry.     Findings: No rash.  Neurological:     Mental Status: He is alert.     Comments: Alert, speech clear. Mental status c/w baseline per family. Equal grips.   Psychiatric:        Mood and Affect: Mood normal.      ED Treatments / Results  Labs (all labs ordered are listed, but only abnormal results are displayed) Labs Reviewed - No data to display  EKG None  Radiology Ct Head Wo Contrast  Result Date: 10/09/2018 CLINICAL DATA:  85 y/o  M; fall with posterior head injury. EXAM: CT HEAD WITHOUT CONTRAST TECHNIQUE: Contiguous axial images were obtained from the base of the skull through the vertex without intravenous contrast. COMPARISON:  09/23/2014 MRI head. FINDINGS: Brain: No evidence of acute infarction, hemorrhage, hydrocephalus, extra-axial collection or mass lesion/mass effect. Stable small chronic infarction within left posterior limb of internal capsule. Nonspecific white matter hypodensities are compatible with chronic microvascular ischemic changes and there  is volume loss of the brain, similar to prior MRI given differences in technique. Vascular: Calcific atherosclerosis of the carotid siphons. Skull: No calvarial fracture. Mild posterior scalp contusion. Sinuses/Orbits: Right maxillary sinus mucous retention cyst. Chronic opacification of the left posterior ethmoid air cells mild expansion, possibly underlying mucocele or polyp. Normal aeration of the mastoid air cells. Bilateral intra-ocular lens replacement. Other: None. IMPRESSION: 1. No acute intracranial abnormality. 2. Mild posterior scalp contusion. No calvarial fracture. 3. Stable chronic microvascular ischemic changes and volume loss of the brain given differences in technique. Electronically Signed   By: Kristine Garbe M.D.   On: 10/09/2018 20:50    Procedures Procedures (including critical care time)  Medications Ordered in ED Medications - No data to display   Initial Impression / Assessment and Plan / ED Course  I have reviewed the triage vital signs and the nursing notes.  Pertinent labs & imaging results that were available during my care of the patient were reviewed by me and considered in my medical decision making (see chart for details).  Imaging ordered.  Reviewed nursing notes and prior charts for additional history.   Ct reviewed - neg acute.   Recheck pt content, nad.   Pt currently appears stable for d/c.     Final Clinical Impressions(s) / ED Diagnoses   Final diagnoses:  None    ED Discharge Orders    None       Lajean Saver, MD 10/09/18  2123  

## 2018-10-09 NOTE — ED Notes (Signed)
Patient transported to CT 

## 2018-10-09 NOTE — ED Triage Notes (Signed)
Per [facility], pt from home w/ a c/o posterior head injury that he sustained from falling and hitting his head on an O2 concentrator. It was reported that the pt was ambulating with the assistance of a walker when he tripped. No LOC. The pt takes Eliquis and Plavix. Pt denies pain. Pupils equal and reactive.   168/76 HR 90 RR 24 95% RA

## 2018-10-10 ENCOUNTER — Ambulatory Visit: Payer: Medicare Other | Admitting: Pulmonary Disease

## 2018-10-26 ENCOUNTER — Other Ambulatory Visit: Payer: Self-pay | Admitting: Internal Medicine

## 2019-01-05 ENCOUNTER — Other Ambulatory Visit: Payer: Self-pay | Admitting: Internal Medicine

## 2019-01-11 ENCOUNTER — Encounter: Payer: Self-pay | Admitting: Pulmonary Disease

## 2019-01-11 ENCOUNTER — Other Ambulatory Visit: Payer: Self-pay

## 2019-01-11 ENCOUNTER — Ambulatory Visit: Payer: Medicare Other | Admitting: Pulmonary Disease

## 2019-01-11 ENCOUNTER — Telehealth: Payer: Self-pay | Admitting: Internal Medicine

## 2019-01-11 ENCOUNTER — Ambulatory Visit (INDEPENDENT_AMBULATORY_CARE_PROVIDER_SITE_OTHER): Payer: Medicare Other

## 2019-01-11 VITALS — BP 108/56 | HR 67 | Temp 98.0°F | Ht 66.0 in | Wt 207.5 lb

## 2019-01-11 DIAGNOSIS — R042 Hemoptysis: Secondary | ICD-10-CM | POA: Diagnosis not present

## 2019-01-11 DIAGNOSIS — J84112 Idiopathic pulmonary fibrosis: Secondary | ICD-10-CM | POA: Diagnosis not present

## 2019-01-11 DIAGNOSIS — J438 Other emphysema: Secondary | ICD-10-CM

## 2019-01-11 MED ORDER — REVEFENACIN 175 MCG/3ML IN SOLN: 1.0000 | Freq: Every day | RESPIRATORY_TRACT | 0 refills | Status: DC

## 2019-01-11 MED ORDER — AMOXICILLIN-POT CLAVULANATE 875-125 MG PO TABS: 1.0000 | ORAL_TABLET | Freq: Two times a day (BID) | ORAL | 0 refills | Status: DC

## 2019-01-11 NOTE — Telephone Encounter (Signed)
Spoke with pt;s wife, she states when he was eating he got chocked up while eatingyesterday. She noticed some specks of blood when he was coughing into a napkin. She states today he seems to be ok but is concerned because he did have pneumonia due to aspiration in October 2019. He is still coughing but she states he has COPD and emphysema so it is hard to tell if he is still coughing due to a possible aspiration. His Covid screening is negative and he doesn't have a fever. She is just wanting to let us know to see if there is anything they need to do to prevent another pneumonia. Please advise  [facility]

## 2019-01-11 NOTE — Progress Notes (Signed)
Discussed results with patient in office.  Nothing further is needed at this time.  Brian Mack FNP  

## 2019-01-11 NOTE — Assessment & Plan Note (Signed)
Assessment: 1 episode of hemoptysis yesterday when having trouble swallowing food Managed on Eliquis No other episodes of hemoptysis  Plan: Chest x-ray today >>> Reveals prevent potential pneumonia Augmentin today Continue Eliquis Continue to monitor for additional hemoptysis

## 2019-01-11 NOTE — Assessment & Plan Note (Signed)
Assessment: 2014 CT chest without contrast shows potential ILD or NSIP No mention of ILD on 2020 CTA Bibasilar rales on exam today  Plan: Continue to monitor clinically May need to consider further imaging in the future

## 2019-01-11 NOTE — Telephone Encounter (Signed)
Appt made for 3pm with NP Mack. Per BM place order for stat CXR. Order placed. Wife aware to arrive early to get CXR. Nothing further needed at this time.

## 2019-01-11 NOTE — Assessment & Plan Note (Signed)
Assessment: Maintained on Yupelri Can use albuterol every 6 hours as needed for shortness of breath and wheezing Lung sounds fairly clear on exam today, bibasilar Rales Patient spouse reports that they cannot afford the Brovana that was prescribed as this would cost over thousand dollars a month  Plan: Continue Yupelri Samples of Yupelri provided today Continue albuterol as needed Follow-up with our office in 6 weeks

## 2019-01-11 NOTE — Progress Notes (Signed)
@Patient  ID: Lee [name], male    DOB: 09-15-1930, 85 y.o.   MRN: [id number]  Chief Complaint  Patient presents with   Acute Visit    Coughing up blood     Referring provider: [name], MD  HPI:  85 year old former smoker followed in our office for ILD and emphysema   PMH: Stroke, PE (on eliquis), CHF, Cardiomyopathy, Chronic Cough Smoker/ Smoking History: Former Smoker. 25 pack year history Maintenance:   Yupelri Pt of: Dr. Chase Caller  01/11/2019  - Visit   85 year old male former smoker followed in our office for ILD as well as emphysema.  He is a patient of Dr. Chase Caller.  Patient presenting to our office today for an acute visit after having 1 episode yesterday (01/10/2019) of hemoptysis after coughing and choking on food.  This prompted the patient spouse to make an appointment with our office as well as to get a chest x-ray as she has concerns as patient has had history of aspiration as well as pneumonia in the past.  Patient is managed on Eliquis for his history of PE.  Patient has not had any sort of body aches, chills, fever.  Patient is a poor historian with baseline dementia.  Spouse was present for appointment today.  She denies any other episodes of hemoptysis.  Hemoptysis that did occur was after coughing episode as well as just had a few scattered spots of blood significantly less than a teaspoon per spouse.  Chest x-ray prior to this office visit reveals:  01/11/2019-chest x-ray- low volume AP exam with increased bilateral interstitial markings concerning for acutely superimposed infection or edema, underlying emphysematous change of fibrosis at bases, mild cardiomegaly  Patient spouse reports that they are maintained on Yupelri nebulized medications.  They never did start Brovana nebulized medication.  Could not afford the Brovana nebulized medication as this would cost around $1200 a month.   Tests:   01/11/2019-chest x-ray- low volume AP exam with increased  bilateral interstitial markings concerning for acutely superimposed infection or edema, underlying emphysematous change of fibrosis at bases, mild cardiomegaly  01/26/2018- swallowing study-modified barium swallow- premature spill and delayed swallow trigger noted, no evidence of vestibular penetration or aspiration, please refer to speech pathologist report for complete details and recommendations, mild aspiration risk  07/28/2017-chest x-ray- changes of pulmonary fibrosis are again seen, no acute disease  09/17/2012-CT chest without contrast- diffuse bronchial wall thickening and moderate centrilobular and paraseptal emphysema, compatible with underlying COPD, some patchy areas of very mild peripheral groundglass attenuation, subpleural reticulation and peripheral bronchiectasis atelectasis could suggest interstitial lung disease/NSIP  06/01/2017-echocardiogram- LV ejection fraction 50 to 22%, systolic function normal, grade 1 diastolic dysfunction  02/54/2706-CB Angio- small segmental subsegmental right lower lobe pulmonary emboli, moderate to severe emphysema, consolidation and groundglass density in the posterior right upper lobe consistent with pneumonia.  FENO:  No results found for: NITRICOXIDE  PFT: PFT Results Latest Ref Rng & Units 04/18/2014 09/19/2013  FVC-Pre L 3.10 3.06  FVC-Predicted Pre % 98 96  FVC-Post L 3.11 3.10  FVC-Predicted Post % 98 97  Pre FEV1/FVC % % 74 74  Post FEV1/FCV % % 76 69  FEV1-Pre L 2.28 2.28  FEV1-Predicted Pre % 103 102  FEV1-Post L 2.37 2.16  DLCO UNC% % 58 55  DLCO COR %Predicted % 77 80  TLC L 7.84 -  TLC % Predicted % 129 -  RV % Predicted % 165 -    Imaging: Dg Chest 2  View  Result Date: 01/11/2019 CLINICAL DATA:  Hemoptysis EXAM: CHEST - 2 VIEW COMPARISON:  Chest radiographs 08/24/2018 FINDINGS: Low volume AP examination with increased bilateral interstitial markings concerning for acutely superimposed infection or edema. Underlying  emphysematous change and fibrosis at the bases. Mild cardiomegaly. IMPRESSION: Low volume AP examination with increased bilateral interstitial markings concerning for acutely superimposed infection or edema. Underlying emphysematous change and fibrosis at the bases. Mild cardiomegaly. Electronically Signed   By: Eddie Candle M.D.   On: 01/11/2019 15:55      Specialty Problems      Pulmonary Problems   Dyspnea on exertion   Other emphysema (HCC)   Chronic cough   IPF (idiopathic pulmonary fibrosis) (HCC)   CAP (community acquired pneumonia)    06/15/2018-CT Angio- small segmental subsegmental right lower lobe pulmonary emboli, moderate to severe emphysema, consolidation and groundglass density in the posterior right upper lobe consistent with pneumonia.      Aspiration pneumonia ([place])   COPD exacerbation ([place])   Coughing up blood      Allergies  Allergen Reactions   Ciprofloxacin Other (See Comments)    Tingle feeling throughout body   Tape Other (See Comments)    Redness, Please use "paper" tape    Immunization History  Administered Date(s) Administered   Influenza Split 06/22/2012, 06/22/2013, 06/03/2015   Influenza, High Dose Seasonal PF 04/22/2016, 05/03/2017, 06/18/2018   Influenza-Unspecified 06/22/2014   Pneumococcal Polysaccharide-23 06/22/2010   Tdap 09/22/2012    Past Medical History:  Diagnosis Date   Allergic rhinitis    Anxiety    Basal cell carcinoma of face    Cardiomyopathy ([place]) 04/2015   EF 40-45% by echo. Apical anterior akinesis seen on echo, not confirmed on Myoview was negative for ischemia. Myoview suggested a small basal inferoseptal defect.   Cholecystitis    a. complex admit 04/2015 due to sepsis due to acute Escherichia coli cholecystitis and right lower lobe CAP and Citrobacter UTI.   Chronic respiratory failure ([place])    a. on home O2 after discharge 04/2015.   Chronic systolic CHF (congestive heart failure) ([place])    a. Dx  04/2015 during complex admission for cholecystitis - EF 40-45% +WMA.   COPD (chronic obstructive pulmonary disease) (HCC)    Erectile dysfunction    History of endovascular stent graft for abdominal aortic aneurysm 2002   a. s/p repair  Followed by Dr. Donnetta Hutching (stable evaluation 01/2106)   History of hiatal hernia    HTN (hypertension)    Hyperlipidemia    Interstitial lung disease ([place])    a. followed by pulm.   LBBB (left bundle branch block)    a. Intermittent - seen in 2013, not in 09/2013, but again in 04/2015.   Obesity    On home oxygen therapy    "just at night" (10/05/2015)   PVD (peripheral vascular disease) ([place])    Shingles    Stroke ([place])    a. H/o aphasia - was told he'd had ministrokes. Imaging 09/2014 revealed prior infarct.    Tobacco History: Social History   Tobacco Use  Smoking Status Former Smoker   Packs/day: 1.00   Years: 25.00   Pack years: 25.00   Types: Cigarettes   Start date: 01/11/1951   Last attempt to quit: 08/22/1974   Years since quitting: 44.4  Smokeless Tobacco Never Used   Counseling given: Yes   Continue to not smoke  Outpatient Encounter Medications as of 01/11/2019  Medication Sig   albuterol (PROVENTIL  HFA;VENTOLIN HFA) 108 (90 Base) MCG/ACT inhaler Inhale 2 puffs into the lungs every 6 (six) hours as needed for wheezing or shortness of breath.   albuterol (PROVENTIL) (2.5 MG/3ML) 0.083% nebulizer solution Take 3 mLs (2.5 mg total) by nebulization every 6 (six) hours as needed for wheezing or shortness of breath. (Patient taking differently: Take 2.5 mg by nebulization 2 (two) times daily. )   apixaban (ELIQUIS) 5 MG TABS tablet Take 1 tablet (5 mg total) by mouth 2 (two) times daily. (Patient taking differently: Take 5 mg by mouth 2 (two) times daily with a meal. )   carvedilol (COREG) 12.5 MG tablet TAKE 1 TABLET (12.5 MG TOTAL) BY MOUTH 2 (TWO) TIMES DAILY WITH A MEAL. (Patient taking differently: Take 12.5 mg by  mouth 2 (two) times daily with a meal. )   cholecalciferol (VITAMIN D) 1000 units tablet Take 1 tablet (1,000 Units total) by mouth daily. (Patient taking differently: Take 1,000 Units by mouth daily with supper. )   clopidogrel (PLAVIX) 75 MG tablet Take 1 tablet (75 mg total) by mouth daily. (Patient taking differently: Take 75 mg by mouth daily at 3 pm. )   ezetimibe (ZETIA) 10 MG tablet Take 1 tablet (10 mg total) by mouth daily.   feeding supplement, ENSURE ENLIVE, (ENSURE ENLIVE) LIQD Take 237 mLs by mouth 2 (two) times daily between meals. (Patient taking differently: Take 237 mLs by mouth daily at 3 pm. )   furosemide (LASIX) 20 MG tablet Take 1 tablet (20 mg total) by mouth every Monday, Wednesday, and Friday.   Guaifenesin (MUCINEX MAXIMUM STRENGTH) 1200 MG TB12 Take 1,200 mg by mouth 2 (two) times daily.   losartan (COZAAR) 50 MG tablet Take 1 tablet (50 mg total) by mouth daily.   Multiple Vitamin (MULTIVITAMIN WITH MINERALS) TABS tablet Take 1 tablet by mouth daily.   nitroGLYCERIN (NITROSTAT) 0.4 MG SL tablet Place 1 tablet (0.4 mg total) under the tongue every 5 (five) minutes as needed for chest pain.   pantoprazole (PROTONIX) 40 MG tablet Take 1 tablet (40 mg total) by mouth daily. (Patient taking differently: Take 40 mg by mouth daily before breakfast. )   Pitavastatin Calcium (LIVALO) 2 MG TABS Take 1 tablet (2 mg total) by mouth See admin instructions. Every 3rd day (Patient taking differently: Take 1 mg by mouth every 3 (three) days. )   potassium chloride (MICRO-K) 10 MEQ CR capsule Take 1 capsule (10 mEq total) by mouth every Monday, Wednesday, and Friday. **Take with lasix**   Probiotic Product ([place]) CAPS Take 1 capsule by mouth daily.   Revefenacin (YUPELRI) 175 MCG/3ML SOLN Inhale 3 mLs into the lungs daily.   amoxicillin-clavulanate (AUGMENTIN) 875-125 MG tablet Take 1 tablet by mouth 2 (two) times daily.   arformoterol (BROVANA) 15 MCG/2ML  NEBU Take 2 mLs (15 mcg total) by nebulization 2 (two) times daily. (Patient not taking: Reported on 01/11/2019)   Revefenacin (YUPELRI) 175 MCG/3ML SOLN Inhale 1 vial into the lungs daily.   [DISCONTINUED] amoxicillin-clavulanate (AUGMENTIN) 875-125 MG tablet Take 1 tablet by mouth 2 (two) times daily.   [DISCONTINUED] olopatadine (PATANOL) 0.1 % ophthalmic solution Place 1 drop into the left eye 2 (two) times daily. (Patient not taking: Reported on 01/11/2019)   No facility-administered encounter medications on file as of 01/11/2019.      Review of Systems  Review of Systems  Constitutional: Positive for fatigue. Negative for activity change, chills, fever and unexpected weight change.  HENT: Negative  for postnasal drip, sinus pressure, sinus pain, sneezing and sore throat.   Eyes: Negative.   Respiratory: Positive for cough (productive cough with yellow mucous). Negative for shortness of breath and wheezing.   Cardiovascular: Negative for chest pain and palpitations.  Gastrointestinal: Negative for diarrhea, nausea and vomiting.  Endocrine: Negative.   Musculoskeletal: Negative.   Skin: Negative.   Neurological: Negative for dizziness and headaches.  Psychiatric/Behavioral: Negative.  Negative for dysphoric mood. The patient is not nervous/anxious.   All other systems reviewed and are negative.    Physical Exam  BP (!) 108/56 (BP Location: Right Arm, Cuff Size: Normal)    Pulse 67    Temp 98 F (36.7 C) (Oral)    Ht 5\' 6"  (1.676 m)    Wt 207 lb 8 oz (94.1 kg) Comment: taken at home 5/22 unalbe to get on scale   SpO2 94%    BMI 33.49 kg/m   Wt Readings from Last 5 Encounters:  01/11/19 207 lb 8 oz (94.1 kg)  10/09/18 207 lb (93.9 kg)  09/27/18 209 lb 3.2 oz (94.9 kg)  08/24/18 206 lb (93.4 kg)  08/16/18 203 lb 4.2 oz (92.2 kg)     Physical Exam  Constitutional: He is oriented to person, place, and time and well-developed, well-nourished, and in no distress. No distress.    Chronically ill male  HENT:  Head: Normocephalic and atraumatic.  Right Ear: Hearing, tympanic membrane, external ear and ear canal normal.  Left Ear: Hearing, tympanic membrane, external ear and ear canal normal.  Mouth/Throat: Uvula is midline and oropharynx is clear and moist. No oropharyngeal exudate.  Eyes: Pupils are equal, round, and reactive to light.  Neck: Normal range of motion. Neck supple.  Cardiovascular: Normal rate, regular rhythm and normal heart sounds.  Pulmonary/Chest: Effort normal. No accessory muscle usage. No respiratory distress. He has no decreased breath sounds. He has no wheezes. He has no rhonchi. He has rales (bibasilar rales).  Abdominal: Soft. Bowel sounds are normal. He exhibits distension. There is no abdominal tenderness.  Musculoskeletal: Normal range of motion.        General: No edema.  Lymphadenopathy:    He has no cervical adenopathy.  Neurological: He is alert and oriented to person, place, and time. Gait normal.  Skin: Skin is warm and dry. He is not diaphoretic. No erythema.  Psychiatric: Mood normal. His mood appears not anxious. He does not exhibit a depressed mood. He exhibits disordered thought content and abnormal new learning ability. He has a flat affect.  Nursing note and vitals reviewed.     Lab Results:  CBC    Component Value Date/Time   WBC 8.7 08/14/2018 0339   RBC 4.27 08/14/2018 0339   HGB 13.0 08/14/2018 0339   HCT 39.2 08/14/2018 0339   PLT 255 08/14/2018 0339   MCV 91.8 08/14/2018 0339   MCH 30.4 08/14/2018 0339   MCHC 33.2 08/14/2018 0339   RDW 13.9 08/14/2018 0339   LYMPHSABS 2.4 08/13/2018 1513   MONOABS 1.2 (H) 08/13/2018 1513   EOSABS 0.6 (H) 08/13/2018 1513   BASOSABS 0.1 08/13/2018 1513    BMET    Component Value Date/Time   NA 136 08/24/2018 1212   NA 137 06/27/2018   K 4.4 08/24/2018 1212   CL 102 08/24/2018 1212   CO2 28 08/24/2018 1212   GLUCOSE 81 08/24/2018 1212   BUN 19 08/24/2018 1212    BUN 15 06/27/2018   CREATININE 1.04 08/24/2018 1212  CREATININE 1.02 09/07/2015 1701   CALCIUM 8.3 (L) 08/24/2018 1212   GFRNONAA >60 08/13/2018 1513   GFRAA >60 08/13/2018 1513    BNP    Component Value Date/Time   BNP 99.8 08/13/2018 1513    ProBNP    Component Value Date/Time   PROBNP 39.0 08/24/2018 1212      Assessment & Plan:   Coughing up blood Assessment: 1 episode of hemoptysis yesterday when having trouble swallowing food Managed on Eliquis No other episodes of hemoptysis  Plan: Chest x-ray today >>> Reveals prevent potential pneumonia Augmentin today Continue Eliquis Continue to monitor for additional hemoptysis   IPF (idiopathic pulmonary fibrosis) ([place]) Assessment: 2014 CT chest without contrast shows potential ILD or NSIP No mention of ILD on 2020 CTA Bibasilar rales on exam today  Plan: Continue to monitor clinically May need to consider further imaging in the future  Other emphysema Assessment: Maintained on Yupelri Can use albuterol every 6 hours as needed for shortness of breath and wheezing Lung sounds fairly clear on exam today, bibasilar Rales Patient spouse reports that they cannot afford the Brovana that was prescribed as this would cost over thousand dollars a month  Plan: Continue Yupelri Samples of Yupelri provided today Continue albuterol as needed Follow-up with our office in 6 weeks     Return in about 6 weeks (around 02/22/2019), or if symptoms worsen or fail to improve, for Follow up with Wyn Quaker FNP-C.   Lauraine Rinne, NP 01/11/2019   This appointment was 34 minutes long with over 50% of the time in direct face-to-face patient care, assessment, plan of care, and follow-up.

## 2019-01-11 NOTE — Telephone Encounter (Signed)
Please schedule a visit with an App

## 2019-01-11 NOTE — Patient Instructions (Addendum)
Augmentin >>> Take 1 875-125 mg tablet every 12 hours for the next 10 days >>> Take with food  Chest Xray today showing potential pneumonia   Continue Yupelri nebulized medication daily >>>Samples provided today  Can use albuterol every 6 hours nebulized as needed for shortness of breath and wheezing  If symptoms worsen or you have concerns about cough, fatigue, body aches then present to an emergency room or contact our office  Return in about 6 weeks (around 02/22/2019), or if symptoms worsen or fail to improve, for Follow up with Wyn Quaker FNP-C.   Coronavirus (COVID-19) Are you at risk?  Are you at risk for the Coronavirus (COVID-19)?  To be considered HIGH RISK for Coronavirus (COVID-19), you have to meet the following criteria:  . Traveled to [place], [place], [place], [place] or [place]; or in the [place] to [place], [place], [place], or [place]; and have fever, cough, and shortness of breath within the last 2 weeks of travel OR . Been in close contact with a person diagnosed with COVID-19 within the last 2 weeks and have fever, cough, and shortness of breath . IF YOU DO NOT MEET THESE CRITERIA, YOU ARE CONSIDERED LOW RISK FOR COVID-19.  What to do if you are HIGH RISK for COVID-19?  [name] If you are having a medical emergency, call 911. . Seek medical care right away. Before you go to a doctor's office, urgent care or emergency department, call ahead and tell them about your recent travel, contact with someone diagnosed with COVID-19, and your symptoms. You should receive instructions from your physician's office regarding next steps of care.  . When you arrive at healthcare provider, tell the healthcare staff immediately you have returned from visiting [place], [place], [place], [place] or [place]; or traveled in the [place] to [place], [place], [place], or [place]; in the last two weeks or you have been in close contact with a person diagnosed with COVID-19  in the last 2 weeks.   . Tell the health care staff about your symptoms: fever, cough and shortness of breath. . After you have been seen by a medical provider, you will be either: o Tested for (COVID-19) and discharged home on quarantine except to seek medical care if symptoms worsen, and asked to  - Stay home and avoid contact with others until you get your results (4-5 days)  - Avoid travel on public transportation if possible (such as bus, train, or airplane) or o Sent to the Emergency Department by EMS for evaluation, COVID-19 testing, and possible admission depending on your condition and test results.  What to do if you are LOW RISK for COVID-19?  Reduce your risk of any infection by using the same precautions used for avoiding the common cold or flu:  [name] Wash your hands often with soap and warm water for at least 20 seconds.  If soap and water are not readily available, use an alcohol-based hand sanitizer with at least 60% alcohol.  . If coughing or sneezing, cover your mouth and nose by coughing or sneezing into the elbow areas of your shirt or coat, into a tissue or into your sleeve (not your hands). . Avoid shaking hands with others and consider head nods or verbal greetings only. . Avoid touching your eyes, nose, or mouth with unwashed hands.  . Avoid close contact with people who are sick. . Avoid places or events with large numbers of people in one location, like  concerts or sporting events. . Carefully consider travel plans you have or are making. . If you are planning any travel outside or inside the [place], visit the CDC's Travelers' Health webpage for the latest health notices. . If you have some symptoms but not all symptoms, continue to monitor at home and seek medical attention if your symptoms worsen. . If you are having a medical emergency, call 911.   [place] / e-Visit:  [email or web address]         [facility]: [facility]: [phone]                   [facility]: [phone]           It is flu season:   >>> Best ways to protect herself from the flu: Receive the yearly flu vaccine, practice good hand hygiene washing with soap and also using hand sanitizer when available, eat a nutritious meals, get adequate rest, hydrate appropriately   Please contact the office if your symptoms worsen or you have concerns that you are not improving.   Thank you for choosing [facility] for your healthcare, and for allowing [place] to partner with you on your healthcare journey. I am thankful to be able to provide care to you today.   Wyn Quaker FNP-C

## 2019-01-24 ENCOUNTER — Telehealth: Payer: Self-pay

## 2019-01-24 NOTE — Telephone Encounter (Signed)
Attempted to call pt's wife Enid Derry but unable to reach. Left message for Enid Derry to return call.

## 2019-01-24 NOTE — Telephone Encounter (Signed)
Pt's wife is calling back [phone]

## 2019-01-24 NOTE — Telephone Encounter (Signed)
Primary Pulmonologist: MR Last office visit and with whom: 01/11/2019 with Wyn Quaker What do we see them for (pulmonary problems): IPF Last OV assessment/plan: Assessment & Plan Note by Lauraine Rinne, NP at 01/11/2019 4:33 PM  Author: Lauraine Rinne, NP Author Type: Nurse Practitioner Filed: 01/11/2019 4:33 PM  Note Status: Written Cosign: Cosign Not Required Encounter Date: 01/11/2019  Problem: Coughing up blood  Editor: Lauraine Rinne, NP (Nurse Practitioner)    Assessment: 1 episode of hemoptysis yesterday when having trouble swallowing food Managed on Eliquis No other episodes of hemoptysis  Plan: Chest x-ray today >>> Reveals prevent potential pneumonia Augmentin today Continue Eliquis Continue to monitor for additional hemoptysis     Patient Instructions by Lauraine Rinne, NP at 01/11/2019 3:00 PM  Author: Lauraine Rinne, NP Author Type: Nurse Practitioner Filed: 01/11/2019 4:06 PM  Note Status: Addendum Cosign: Cosign Not Required Encounter Date: 01/11/2019  Editor: Lauraine Rinne, NP (Nurse Practitioner)  Prior Versions: 1. Lauraine Rinne, NP (Nurse Practitioner) at 01/11/2019 4:05 PM - Addendum   2. Lauraine Rinne, NP (Nurse Practitioner) at 01/11/2019 4:03 PM - Addendum   3. Lauraine Rinne, NP (Nurse Practitioner) at 01/11/2019 4:01 PM - Signed    Augmentin >>> Take 1 875-125 mg tablet every 12 hours for the next 10 days >>> Take with food  Chest Xray today showing potential pneumonia   Continue Yupelri nebulized medication daily >>>Samples provided today  Can use albuterol every 6 hours nebulized as needed for shortness of breath and wheezing  If symptoms worsen or you have concerns about cough, fatigue, body aches then present to an emergency room or contact our office  Return in about 6 weeks (around 02/22/2019), or if symptoms worsen or fail to improve, for Follow up with Wyn Quaker FNP-C.      Was appointment offered to patient (explain)?  Requesting refill of  amoxicillin   Reason for call: Called and spoke with pt's wife Enid Derry who is requesting to have a refill of pt's amoxicillin. Pt is still coughing since last Rx and due to this is why they are requesting a refill.  Pt has been doing neb treatments which has helped loosen mucus up and pt is able to cough some mucus but Enid Derry states pt swallows the mucus instead of fully coughing it up. Enid Derry states he is taking mucinex every 12 hours.  Enid Derry stated they are supposed to be going out of town due to family coming in and they are going to be going to [facility] as they have a beach house there that they will be at.  Enid Derry stated they will be back from the beach either Monday late evening or Tuesday morning. Enid Derry stated that pt will not be leaving the beach house.  Enid Derry states that pt has not had any fever, denies any complaints of SOB, chest tightness, body aches, or chills.  Along with requesting a refill of the amoxicillin, Enid Derry is wanting to know if pt should come get another cxr. [name], please advise on this for pt and wife Enid Derry. Thanks!

## 2019-01-24 NOTE — Telephone Encounter (Signed)
Spoke with Enid Derry, she states his cough is better since using the breathing treatments and she denies fever. She is concerned that with them going to the beach, if he flares up again they wanted to have the ABX on hand. She wants to know if the ABX will help with this and other recommendations since they are leaving out of town today. [name] please advise.

## 2019-01-24 NOTE — Telephone Encounter (Signed)
Patient is recovering from a suspected pneumonia.  He is 85 years old with multiple comorbidities.  It will take time for him to recover from pneumonia.  Recurrently treating with antibiotics does not make the pneumonia go away quicker.  It is not shocking to me that he has a productive cough.  What should be happening is that he should be making less and less sputum and also that color should be improving to a white or almost clear at this point.  If symptoms worsen they can contact our office.  Or they can present to an emergency room.  I do not believe additional antibiotics are needed at this time as they had an adequate course with Augmentin.  I agree that they do need to have another chest x-ray but it needs to be further out than today unless symptoms are worsening today.   To my understanding and based off of this triage reports symptoms are not worsening right now?  This seems to be more of a precaution they want to have a chest x-ray before going on a family beach trip?  Wyn Quaker FNP

## 2019-01-24 NOTE — Telephone Encounter (Signed)
Has the patient symptoms clinically improved?  Yes patient does need follow-up chest x-ray but this is too soon to be completing a chest x-ray.  His last appointment was 01/11/2019.   Does he have any fevers?  It seems like the antibiotics being request just because he is coughing and they are going to the beach and they want to have it?  Or what other concerns?  Most likely the patient will need an in office visit if symptoms are worsening or not improving despite antibiotic treatment.  Wyn Quaker, FNP

## 2019-01-24 NOTE — Telephone Encounter (Signed)
Called and spoke with pt's wife Lee [name] stating to her all the info from [place] expressed understanding. Nothing further needed.

## 2019-01-29 ENCOUNTER — Telehealth: Payer: Self-pay | Admitting: Cardiology

## 2019-01-29 ENCOUNTER — Telehealth: Payer: Self-pay | Admitting: *Deleted

## 2019-01-29 NOTE — Telephone Encounter (Signed)
   TELEPHONE CALL NOTE  This patient has been deemed a candidate for follow-up tele-health visit to limit community exposure during the Covid-19 pandemic. I spoke with the patient via phone to discuss instructions.  The patient will receive a phone call 2-3 days prior to their E-Visit at which time consent will be verbally confirmed.   A Virtual Office Visit appointment type has been scheduled for 6/10 with [facility]ing, with "VIDEO/email. I have either confirmed the patient is active in [place]   .Raiford Simmonds, RN 01/29/2019 1:36 PM

## 2019-01-29 NOTE — Telephone Encounter (Signed)
Video visit via ipad/browser, mychart active, verbal consent given, pre-reg complete 01/29/2019 MS

## 2019-01-30 ENCOUNTER — Telehealth (INDEPENDENT_AMBULATORY_CARE_PROVIDER_SITE_OTHER): Payer: Medicare Other | Admitting: Cardiology

## 2019-01-30 ENCOUNTER — Telehealth: Payer: Self-pay

## 2019-01-30 ENCOUNTER — Encounter: Payer: Self-pay | Admitting: Cardiology

## 2019-01-30 VITALS — BP 122/60 | HR 68 | Ht 66.0 in | Wt 209.0 lb

## 2019-01-30 DIAGNOSIS — I10 Essential (primary) hypertension: Secondary | ICD-10-CM

## 2019-01-30 DIAGNOSIS — I428 Other cardiomyopathies: Secondary | ICD-10-CM | POA: Diagnosis not present

## 2019-01-30 DIAGNOSIS — I2782 Chronic pulmonary embolism: Secondary | ICD-10-CM | POA: Diagnosis not present

## 2019-01-30 DIAGNOSIS — I119 Hypertensive heart disease without heart failure: Secondary | ICD-10-CM

## 2019-01-30 DIAGNOSIS — E785 Hyperlipidemia, unspecified: Secondary | ICD-10-CM

## 2019-01-30 NOTE — Progress Notes (Signed)
Virtual Visit via Video Note   This visit type was conducted due to national recommendations for restrictions regarding the COVID-19 Pandemic (e.g. social distancing) in an effort to limit this patient's exposure and mitigate transmission in our community.  Due to his co-morbid illnesses, this patient is at least at moderate risk for complications without adequate follow up.  This format is felt to be most appropriate for this patient at this time.  All issues noted in this document were discussed and addressed.  A limited physical exam was performed with this format.  Please refer to the patient's chart for his consent to telehealth for [facility].   Patient has given verbal permission to conduct this visit via virtual appointment and to bill insurance 01/31/2019 12:22 AM     Evaluation Performed:  Follow-up visit  Date:  01/31/2019   ID:  OTT ZIMMERLE, DOB 1931-07-04, MRN [id number]  Patient Location: Home Provider Location: Office  PCP:  [name], MD  Cardiologist:  Glenetta Hew, MD  Electrophysiologist:  None  Vascular Surgeon: Dr. Curt Jews  Chief Complaint: 24-month follow-up for mild-moderate Nonischemic Cardiomyopathy with Chronic Combined Heart Failure and chronic LBBB, AAA-status post repair (Dr. Donnetta Hutching)  History of Present Illness:    COURTEZ TWADDLE is a 85 y.o. male with cardiovascular PMH noted above and below who presents via audio/video conferencing for a telehealth visit today. I had not seen him since August 2018.  He was a former patient of Dr. Mare Ferrari with additional PMH of O2 dependent COPD and pulmonary fibrosis with prior history of stroke, hypertension and hyperlipidemia. Now with progressive cognitive decline--memory loss, A&O to person and place only  Nonischemic nuclear stress test, and follow-up echo in October 2019 (done for PE) showed relatively normal EF.  (Relatively consistent with echo in 2018 showing EF of 50 to 55% with LBBB related  paradoxical septal motion) --Since this discharge she has been on Plavix (for his history of stroke) and Eliquis.   Kashus Phillips Climes was last seen by Jory Sims, NP in December 2019 --> this was a hospital follow-up after aspiration pneumonia & R sided subsegmental and segmental PE (in Oct 2019 -- now on DOAC).  He was otherwise doing well from a cardiac standpoint with no complaints.  --Hospitalized in December for COPD exacerbation  Interval History:  I am seeing him for today via video, but he is pretty much nonfocal besides occasionally mumbling to yes or sure.  Almost all the history is provided by either his wife or daughter. Overall he is pretty much limited activity walking with a rolling walker for short trips from the house to the car or around the house, anything further use of wheelchair.  He is still on O2 would definitely use it at night.  He has a little orthopnea he first lays down, but not to get the oxygen off he does fine.  He had chronic relatively stable edema for which the give him Lasix 3 days a week.  He has not needs any additional dosing.  As far as they can tell, he is not complaining of any more than his baseline shortness of breath.  He has no indication of having any chest pain or pressure.  No syncope or near syncope.  No recurrent TIA or amaurosis fugax symptoms.  Cardiovascular ROS: positive for - shortness of breath and This is very much his baseline as well as edema noted above negative for - chest pain, irregular heartbeat, loss of  consciousness, orthopnea, palpitations, paroxysmal nocturnal dyspnea, rapid heart rate or Claudication  The patient does not have symptoms concerning for COVID-19 infection (fever, chills, cough, or new shortness of breath). Baseline SOB The patient is practicing social distancing.  Home & at beach house -- Daughter getting groceries.    ROS:  Please see the history of present illness.    Review of Systems  Constitutional:  Negative for malaise/fatigue (Just has no drive to do anything) and weight loss.  HENT: Positive for hearing loss. Negative for congestion and nosebleeds.   Respiratory: Positive for shortness of breath (At baseline) and wheezing (Off and on). Negative for cough and hemoptysis.   Cardiovascular: Positive for leg swelling (Pretty well controlled).  Gastrointestinal: Negative for blood in stool and melena.  Genitourinary: Negative for hematuria.  Musculoskeletal: Positive for joint pain.       Has unsteady gait and diffuse arthritis.  Neurological: Positive for weakness (Generalized). Negative for dizziness (Sometimes positional) and headaches.  Endo/Heme/Allergies: Bruises/bleeds easily.  Psychiatric/Behavioral: Positive for memory loss. Negative for hallucinations. The patient has insomnia.     Past Medical History:  Diagnosis Date  . Allergic rhinitis   . Anxiety   . Basal cell carcinoma of face   . Cardiomyopathy ([place]) 04/2015   EF 40-45% by echo. Apical anterior akinesis seen on echo, not confirmed on Myoview was negative for ischemia. Myoview suggested a small basal inferoseptal defect.  . Cholecystitis    a. complex admit 04/2015 due to sepsis due to acute Escherichia coli cholecystitis and right lower lobe CAP and Citrobacter UTI.  [name] Chronic respiratory failure ([place])    a. on home O2 after discharge 04/2015.  [name] Chronic systolic CHF (congestive heart failure) ([place])    a. Dx 04/2015 during complex admission for cholecystitis - EF 40-45% +WMA.  [name] COPD (chronic obstructive pulmonary disease) ([place])   . Erectile dysfunction   . History of endovascular stent graft for abdominal aortic aneurysm 2002   a. s/p repair  Followed by Dr. Donnetta Hutching (stable evaluation 01/2106)  . History of hiatal hernia   . HTN (hypertension)   . Hyperlipidemia   . Interstitial lung disease ([place])    a. followed by pulm.  [name] LBBB (left bundle branch block)    a. Intermittent - seen in 2013, not in 09/2013, but again  in 04/2015.  . Obesity   . On home oxygen therapy    "just at night" (10/05/2015)  . PVD (peripheral vascular disease) ([place])   . Shingles   . Stroke [facility])    a. H/o aphasia - was told he'd had ministrokes. Imaging 09/2014 revealed prior infarct.   Past Surgical History:  Procedure Laterality Date  . AORTA SURGERY  2002  . CHOLECYSTECTOMY N/A 10/05/2015   Procedure: LAPAROSCOPIC CHOLECYSTECTOMY  ;  Surgeon: Autumn Messing III, MD;  Location: [place];  Service: General;  Laterality: N/A;  . [place]  . INTRAOPERATIVE CHOLANGIOGRAM  10/05/2015   Procedure: INTRAOPERATIVE CHOLANGIOGRAM;  Surgeon: Autumn Messing III, MD;  Location: [place];  Service: General;;  . LAPAROSCOPIC CHOLECYSTECTOMY  10/05/2015  . NM MYOVIEW LTD  05/2015   EF 49%. Small size, mild severity perfusion defect in the basal-mid inferoseptal wall. LOW RISK. No ischemia. Defect thought to be related to LBBB artifacts and not true lesion. This could also explain mild systolic dysfunction.  . TRANSTHORACIC ECHOCARDIOGRAM  04/2015   (In setting of acute illness).EF 40-45% with mid-apical anteroseptal akinesis. Moderately increased PA pressures of  roughly 48 mmHg.  [name] URETHRAL STRICTURE DILATATION  late 1970s   urinary tract stretch     Current Meds  Medication Sig  . albuterol (PROVENTIL HFA;VENTOLIN HFA) 108 (90 Base) MCG/ACT inhaler Inhale 2 puffs into the lungs every 6 (six) hours as needed for wheezing or shortness of breath.  [name] albuterol (PROVENTIL) (2.5 MG/3ML) 0.083% nebulizer solution Take 3 mLs (2.5 mg total) by nebulization every 6 (six) hours as needed for wheezing or shortness of breath. (Patient taking differently: Take 2.5 mg by nebulization 2 (two) times daily. )  . apixaban (ELIQUIS) 5 MG TABS tablet Take 1 tablet (5 mg total) by mouth 2 (two) times daily. (Patient taking differently: Take 5 mg by mouth 2 (two) times daily with a meal. )  . arformoterol (BROVANA) 15 MCG/2ML NEBU Take 2 mLs (15 mcg total) by  nebulization 2 (two) times daily.  . carvedilol (COREG) 12.5 MG tablet TAKE 1 TABLET (12.5 MG TOTAL) BY MOUTH 2 (TWO) TIMES DAILY WITH A MEAL. (Patient taking differently: Take 12.5 mg by mouth 2 (two) times daily with a meal. )  . cholecalciferol (VITAMIN D) 1000 units tablet Take 1 tablet (1,000 Units total) by mouth daily. (Patient taking differently: Take 1,000 Units by mouth daily with supper. )  . clopidogrel (PLAVIX) 75 MG tablet Take 1 tablet (75 mg total) by mouth daily. (Patient taking differently: Take 75 mg by mouth daily at 3 pm. )  . ezetimibe (ZETIA) 10 MG tablet Take 1 tablet (10 mg total) by mouth daily.  . feeding supplement, ENSURE ENLIVE, (ENSURE ENLIVE) LIQD Take 237 mLs by mouth 2 (two) times daily between meals. (Patient taking differently: Take 237 mLs by mouth daily at 3 pm. )  . furosemide (LASIX) 20 MG tablet Take 1 tablet (20 mg total) by mouth every Monday, Wednesday, and Friday.  . Guaifenesin (MUCINEX MAXIMUM STRENGTH) 1200 MG TB12 Take 1,200 mg by mouth 2 (two) times daily.  [name] losartan (COZAAR) 50 MG tablet Take 1 tablet (50 mg total) by mouth daily.  . Multiple Vitamin (MULTIVITAMIN WITH MINERALS) TABS tablet Take 1 tablet by mouth daily.  . nitroGLYCERIN (NITROSTAT) 0.4 MG SL tablet Place 1 tablet (0.4 mg total) under the tongue every 5 (five) minutes as needed for chest pain.  . pantoprazole (PROTONIX) 40 MG tablet Take 1 tablet (40 mg total) by mouth daily. (Patient taking differently: Take 40 mg by mouth daily before breakfast. )  . Pitavastatin Calcium (LIVALO) 2 MG TABS Take 1 tablet (2 mg total) by mouth See admin instructions. Every 3rd day (Patient taking differently: Take 1 mg by mouth every 3 (three) days. )  . potassium chloride (MICRO-K) 10 MEQ CR capsule Take 1 capsule (10 mEq total) by mouth every Monday, Wednesday, and Friday. **Take with lasix**  . Probiotic Product ([place]) CAPS Take 1 capsule by mouth daily.  . Revefenacin (YUPELRI)  175 MCG/3ML SOLN Inhale 3 mLs into the lungs daily.  . Revefenacin (YUPELRI) 175 MCG/3ML SOLN Inhale 1 vial into the lungs daily.     Allergies:   Ciprofloxacin and Tape   Social History   Tobacco Use  . Smoking status: Former Smoker    Packs/day: 1.00    Years: 25.00    Pack years: 25.00    Types: Cigarettes    Start date: 01/11/1951    Last attempt to quit: 08/22/1974    Years since quitting: 44.4  . Smokeless tobacco: Never Used  Substance Use Topics  .  Alcohol use: No  . Drug use: No     Family Hx: The patient's family history includes Colon cancer in his brother; Heart disease in his father; Hypertension in his mother; Stroke in his father.   Prior CV studies:   The following studies were reviewed today: . Echo October 2019:  Labs/Other Tests and Data Reviewed:    EKG:  No ECG reviewed.  Recent Labs: 08/13/2018: B Natriuretic Peptide 99.8 08/14/2018: Hemoglobin 13.0; Platelets 255 08/24/2018: ALT 42; BUN 19; Creatinine, Ser 1.04; Potassium 4.4; Pro B Natriuretic peptide (BNP) 39.0; Sodium 136   Recent Lipid Panel -- PCP due to check in July No results found for: CHOL, TRIG, HDL, CHOLHDL, LDLCALC, LDLDIRECT  Wt Readings from Last 3 Encounters:  01/30/19 209 lb (94.8 kg)  01/11/19 207 lb 8 oz (94.1 kg)  10/09/18 207 lb (93.9 kg)     Objective:    Vital Signs:  BP 122/60   Pulse 68   Ht 5\' 6"  (1.676 m)   Wt 209 lb (94.8 kg)   SpO2 95%   BMI 33.73 kg/m   VITAL SIGNS:  reviewed GEN:  Pleasant elderly gentleman.  He is in no obvious distress, but is clearly minimally responsive besides making eye contact to the computer.  Does not answer questions. RESPIRATORY:  normal respiratory effort, symmetric expansion NEURO:  Alert and oriented only to person and place.  Does not answer questions.  Appears to move all 4 extremities. PSYCH:  Difficult to tell his affect, but seems relatively blunted.  Cannot assess mood.   ASSESSMENT & PLAN:    Problem List Items  Addressed This Visit    Essential hypertension (Chronic)    Blood pressure looks great today.  No change to current medications--carvedilol and losartan.      H/o Nonischemic CM -- EF ~50% on f/u studies (~ LBBB related) - Primary (Chronic)    He does not have systolic.  But he probably barely has any diastolic heart failure.  He clearly has some diastolic dysfunction, but the edema is probably more related to venous stasis.  He takes 3 times a week Lasix, -okay to take additional doses as needed. He is on stable dose of carvedilol and ARB.      Hyperlipidemia with target LDL less than 100 (Chronic)    Lipids are pretty darn well controlled with Zetia plus low-dose Livalo.  I would not be any more aggressive.  In fact his memory issues being a concern, would consider stopping Livalo.      Hypertensive heart disease without CHF (mild HFpEF) (Chronic)    His main manifestation is edema and orthopnea which actually is probably more related to COPD than CHF.  Is on 3 times a week Lasix, I just simply said that okay to take additional doses as needed for worsening swelling. Otherwise continue beta-blocker and ARB at current doses. Continue support stockings.      Pulmonary emboli (HCC) (Chronic)    Eliquis and Plavix.  I think is probably not unreasonable to hold his Plavix while he is on Eliquis.  I would defer to his PCP up since I think the Plavix is for his strokes, but I think were probably okay doing that.  Will defer to PCP. I am not sure what the recommended duration of Eliquis has been.         COVID-19 Education: The signs and symptoms of COVID-19 were discussed with the patient and how to seek care for testing (follow  up with PCP or arrange E-visit).   The importance of social distancing was discussed today.  Time:   Today, I have spent 19 minutes with the patient with telehealth technology discussing the above problems.     Medication Adjustments/Labs and Tests Ordered:  Current medicines are reviewed at length with the patient today.  Concerns regarding medicines are outlined above.  Medication Instructions:  I will defer to primary care physician, but may want to consider stopping Plavix while on Eliquis for the blood clot.  This would reduce bleeding. For significant bleeding or bruising, hold Plavix for 3-4 days  Tests Ordered: No orders of the defined types were placed in this encounter.  None  Medication Changes: No orders of the defined types were placed in this encounter.  None  Disposition:  Follow up in 6-8 month(s)    Signed, Glenetta Hew, MD  01/31/2019 12:22 AM     [facility]

## 2019-01-30 NOTE — Patient Instructions (Addendum)
Medication Instructions:  Speak with your Primary Care Doctor about stopping Plavix while on Eliquis for the blood clot. This would reduce the bleeding. For significant bleeding or bruising, hold Plavix for 3-4 days If you need a refill on your cardiac medications before your next appointment, please call your pharmacy.   Lab work: None  If you have labs (blood work) drawn today and your tests are completely normal, you will receive your results only by: [name] MyChart Message (if you have MyChart) OR . A paper copy in the mail If you have any lab test that is abnormal or we need to change your treatment, we will call you to review the results.  Testing/Procedures: None   Follow-Up: At [facility], you and your health needs are our priority.  As part of our continuing mission to provide you with exceptional heart care, we have created designated Provider Care Teams.  These Care Teams include your primary Cardiologist (physician) and Advanced Practice Providers (APPs -  Physician Assistants and Nurse Practitioners) who all work together to provide you with the care you need, when you need it. You will need a follow up appointment in 6-8 months. Please call our office 2 months in advance to schedule this appointment.  You may see Glenetta Hew, MD or one of the following Advanced Practice Providers on your designated Care Team:   Rosaria Ferries, PA-C . Jory Sims, DNP, ANP  Any Other Special Instructions Will Be Listed Below (If Applicable).

## 2019-01-30 NOTE — Telephone Encounter (Addendum)
Contacted patient to discuss AVS Instructions. Gave her Dr Darcus Pester recommendations from today's virtual visit. Informed patient that someone from the scheduling dept will be contacting her to schedule her stress test and follow up appt.  She voiced understanding and AVS mailed to patient.

## 2019-01-30 NOTE — Assessment & Plan Note (Signed)
His main manifestation is edema and orthopnea which actually is probably more related to COPD than CHF.  Is on 3 times a week Lasix, I just simply said that okay to take additional doses as needed for worsening swelling. Otherwise continue beta-blocker and ARB at current doses. Continue support stockings.

## 2019-01-31 ENCOUNTER — Encounter: Payer: Self-pay | Admitting: Cardiology

## 2019-01-31 NOTE — Assessment & Plan Note (Addendum)
He does not have systolic.  But he probably barely has any diastolic heart failure.  He clearly has some diastolic dysfunction, but the edema is probably more related to venous stasis.  He takes 3 times a week Lasix, -okay to take additional doses as needed. He is on stable dose of carvedilol and ARB.

## 2019-01-31 NOTE — Assessment & Plan Note (Signed)
Lipids are pretty darn well controlled with Zetia plus low-dose Livalo.  I would not be any more aggressive.  In fact his memory issues being a concern, would consider stopping Livalo.

## 2019-01-31 NOTE — Assessment & Plan Note (Signed)
Eliquis and Plavix.  I think is probably not unreasonable to hold his Plavix while he is on Eliquis.  I would defer to his PCP up since I think the Plavix is for his strokes, but I think were probably okay doing that.  Will defer to PCP. I am not sure what the recommended duration of Eliquis has been.

## 2019-01-31 NOTE — Assessment & Plan Note (Signed)
Blood pressure looks great today.  No change to current medications--carvedilol and losartan.

## 2019-02-22 ENCOUNTER — Telehealth: Payer: Self-pay | Admitting: Pulmonary Disease

## 2019-02-22 MED ORDER — AMOXICILLIN-POT CLAVULANATE 500-125 MG PO TABS: 1.0000 | ORAL_TABLET | Freq: Three times a day (TID) | ORAL | 0 refills | Status: DC

## 2019-02-22 NOTE — Telephone Encounter (Signed)
Spoke with pt's wife.  He has increased cough with yellow sputum for past two days.  Has made it more difficult for him to sleep.  Wife worried he is breathing more heavy.  No sick exposures.  Denies recurrence of hemoptysis.   Tm 68F, SpO2 92 to 97%, BP 138/76 (all reported by pt's wife).  Will call in script for augmentin.  Advised her that he would need to go to urgent care or emergency room if symptoms progress over the weekend and he needs a chest xray.

## 2019-02-26 ENCOUNTER — Other Ambulatory Visit: Payer: Self-pay | Admitting: Internal Medicine

## 2019-02-27 NOTE — Progress Notes (Signed)
@Patient  ID: Lee [name], male    DOB: October 27, 1930, 85 y.o.   MRN: [id number]  Chief Complaint  Patient presents with  . Follow-up    Needs chest x-ray, treated telephonically for potential aspiration pneumonia    Referring provider: [name], MD  HPI:  85 year old former smoker followed in our office for ILD and emphysema   PMH: Stroke, PE (on eliquis), CHF, Cardiomyopathy, Chronic Cough Smoker/ Smoking History: Former Smoker. 25 pack year history Maintenance:   Yupelri Pt of: Dr. Chase Caller  02/28/2019  - Visit   85 year old male former smoker followed in our office for ILD and emphysema.  Patient had a recent chest x-ray in May/2020 concerning for superimposed infection.  Patient needs repeat chest x-ray today.  Patient was also treated telephonically with Augmentin last week due to fevers, increased cough and congestion.  Patient has a history of aspiration pneumonia.  Patient spouse reports that he had a swallow eval done at [facility] 2 years ago that was normal.  Patient also had swallow eval done at [facility] rehab back in November/2019 that apparently also was normal.  Patient does cough while he swallows food.  June/2019 swallow study does show patient has a mild aspiration risk.  Patient does have a good appetite.  Patient continues to be maintained on Yupelri nebulized medications.   Tests:   01/11/2019-chest x-ray- low volume AP exam with increased bilateral interstitial markings concerning for acutely superimposed infection or edema, underlying emphysematous change of fibrosis at bases, mild cardiomegaly  01/26/2018- swallowing study-modified barium swallow- premature spill and delayed swallow trigger noted, no evidence of vestibular penetration or aspiration, please refer to speech pathologist report for complete details and recommendations, mild aspiration risk  07/28/2017-chest x-ray- changes of pulmonary fibrosis are again seen, no acute disease   09/17/2012-CT chest without contrast- diffuse bronchial wall thickening and moderate centrilobular and paraseptal emphysema, compatible with underlying COPD, some patchy areas of very mild peripheral groundglass attenuation, subpleural reticulation and peripheral bronchiectasis atelectasis could suggest interstitial lung disease/NSIP  06/01/2017-echocardiogram- LV ejection fraction 50 to 10%, systolic function normal, grade 1 diastolic dysfunction  27/25/3664-QI Angio- small segmental subsegmental right lower lobe pulmonary emboli, moderate to severe emphysema, consolidation and groundglass density in the posterior right upper lobe consistent with pneumonia.   FENO:  No results found for: NITRICOXIDE  PFT: PFT Results Latest Ref Rng & Units 04/18/2014 09/19/2013  FVC-Pre L 3.10 3.06  FVC-Predicted Pre % 98 96  FVC-Post L 3.11 3.10  FVC-Predicted Post % 98 97  Pre FEV1/FVC % % 74 74  Post FEV1/FCV % % 76 69  FEV1-Pre L 2.28 2.28  FEV1-Predicted Pre % 103 102  FEV1-Post L 2.37 2.16  DLCO UNC% % 58 55  DLCO COR %Predicted % 77 80  TLC L 7.84 -  TLC % Predicted % 129 -  RV % Predicted % 165 -    Imaging: Dg Chest 2 View  Result Date: 02/28/2019 CLINICAL DATA:  History of pneumonia.  Pulmonary fibrosis. EXAM: CHEST - 2 VIEW COMPARISON:  PA and lateral chest 01/11/2019 and 08/13/2018. CT chest 09/18/2018. FINDINGS: The lungs are emphysematous with fibrosis. No consolidative process, edema, pneumothorax or effusion. Heart size is upper normal. No acute or focal bony abnormality. IMPRESSION: No acute disease. Emphysema and pulmonary fibrosis. Electronically Signed   By: Inge Rise M.D.   On: 02/28/2019 15:07      Specialty Problems      Pulmonary Problems  Dyspnea on exertion   Other emphysema (HCC)   Chronic cough   IPF (idiopathic pulmonary fibrosis) (HCC)   CAP (community acquired pneumonia)    06/15/2018-CT Angio- small segmental subsegmental right lower lobe pulmonary  emboli, moderate to severe emphysema, consolidation and groundglass density in the posterior right upper lobe consistent with pneumonia.      Aspiration pneumonia (HCC)   COPD exacerbation (HCC)   Coughing up blood      Allergies  Allergen Reactions  . Ciprofloxacin Other (See Comments)    Tingle feeling throughout body  . Tape Other (See Comments)    Redness, Please use "paper" tape    Immunization History  Administered Date(s) Administered  . Influenza Split 06/22/2012, 06/22/2013, 06/03/2015  . Influenza, High Dose Seasonal PF 04/22/2016, 05/03/2017, 06/18/2018  . Influenza-Unspecified 06/22/2014  . Pneumococcal Polysaccharide-23 06/22/2010  . Tdap 09/22/2012    Past Medical History:  Diagnosis Date  . Allergic rhinitis   . Anxiety   . Basal cell carcinoma of face   . Cardiomyopathy ([place]) 04/2015   EF 40-45% by echo. Apical anterior akinesis seen on echo, not confirmed on Myoview was negative for ischemia. Myoview suggested a small basal inferoseptal defect.  . Cholecystitis    a. complex admit 04/2015 due to sepsis due to acute Escherichia coli cholecystitis and right lower lobe CAP and Citrobacter UTI.  [name] Chronic respiratory failure ([place])    a. on home O2 after discharge 04/2015.  [name] Chronic systolic CHF (congestive heart failure) ([place])    a. Dx 04/2015 during complex admission for cholecystitis - EF 40-45% +WMA.  [name] COPD (chronic obstructive pulmonary disease) ([place])   . Erectile dysfunction   . History of endovascular stent graft for abdominal aortic aneurysm 2002   a. s/p repair  Followed by Dr. Donnetta Hutching (stable evaluation 01/2106)  . History of hiatal hernia   . HTN (hypertension)   . Hyperlipidemia   . Interstitial lung disease ([place])    a. followed by pulm.  [name] LBBB (left bundle branch block)    a. Intermittent - seen in 2013, not in 09/2013, but again in 04/2015.  . Obesity   . On home oxygen therapy    "just at night" (10/05/2015)  . PVD (peripheral vascular  disease) ([place])   . Shingles   . Stroke [facility])    a. H/o aphasia - was told he'd had ministrokes. Imaging 09/2014 revealed prior infarct.    Tobacco History: Social History   Tobacco Use  Smoking Status Former Smoker  . Packs/day: 1.00  . Years: 25.00  . Pack years: 25.00  . Types: Cigarettes  . Start date: 01/11/1951  . Quit date: 08/22/1974  . Years since quitting: 44.5  Smokeless Tobacco Never Used   Counseling given: Not Answered   Continue to not smoke  Outpatient Encounter Medications as of 02/28/2019  Medication Sig  . albuterol (PROVENTIL HFA;VENTOLIN HFA) 108 (90 Base) MCG/ACT inhaler Inhale 2 puffs into the lungs every 6 (six) hours as needed for wheezing or shortness of breath.  [name] albuterol (PROVENTIL) (2.5 MG/3ML) 0.083% nebulizer solution Take 3 mLs (2.5 mg total) by nebulization every 6 (six) hours as needed for wheezing or shortness of breath. (Patient taking differently: Take 2.5 mg by nebulization 2 (two) times daily. )  . amoxicillin-clavulanate (AUGMENTIN) 500-125 MG tablet Take 1 tablet (500 mg total) by mouth 3 (three) times daily.  [name] apixaban (ELIQUIS) 5 MG TABS tablet Take 1 tablet (5 mg total) by mouth 2 (two)  times daily. (Patient taking differently: Take 5 mg by mouth 2 (two) times daily with a meal. )  . carvedilol (COREG) 12.5 MG tablet TAKE 1 TABLET (12.5 MG TOTAL) BY MOUTH 2 (TWO) TIMES DAILY WITH A MEAL. (Patient taking differently: Take 12.5 mg by mouth 2 (two) times daily with a meal. )  . cholecalciferol (VITAMIN D) 1000 units tablet Take 1 tablet (1,000 Units total) by mouth daily. (Patient taking differently: Take 1,000 Units by mouth daily with supper. )  . clopidogrel (PLAVIX) 75 MG tablet Take 1 tablet (75 mg total) by mouth daily. (Patient taking differently: Take 75 mg by mouth daily at 3 pm. )  . ezetimibe (ZETIA) 10 MG tablet Take 1 tablet (10 mg total) by mouth daily.  . feeding supplement, ENSURE ENLIVE, (ENSURE ENLIVE) LIQD Take 237 mLs by  mouth 2 (two) times daily between meals. (Patient taking differently: Take 237 mLs by mouth daily at 3 pm. )  . furosemide (LASIX) 20 MG tablet Take 1 tablet (20 mg total) by mouth every Monday, Wednesday, and Friday.  . Guaifenesin (MUCINEX MAXIMUM STRENGTH) 1200 MG TB12 Take 1,200 mg by mouth 2 (two) times daily.  [name] losartan (COZAAR) 50 MG tablet Take 1 tablet (50 mg total) by mouth daily.  . Multiple Vitamin (MULTIVITAMIN WITH MINERALS) TABS tablet Take 1 tablet by mouth daily.  . nitroGLYCERIN (NITROSTAT) 0.4 MG SL tablet Place 1 tablet (0.4 mg total) under the tongue every 5 (five) minutes as needed for chest pain.  . pantoprazole (PROTONIX) 40 MG tablet Take 1 tablet (40 mg total) by mouth daily. (Patient taking differently: Take 40 mg by mouth daily before breakfast. )  . Pitavastatin Calcium (LIVALO) 2 MG TABS Take 1 tablet (2 mg total) by mouth See admin instructions. Every 3rd day (Patient taking differently: Take 1 mg by mouth every 3 (three) days. )  . potassium chloride (MICRO-K) 10 MEQ CR capsule Take 1 capsule (10 mEq total) by mouth every Monday, Wednesday, and Friday. **Take with lasix**  . Probiotic Product ([place]) CAPS Take 1 capsule by mouth daily.  . Revefenacin (YUPELRI) 175 MCG/3ML SOLN Inhale 1 vial into the lungs daily.  . YUPELRI 175 MCG/3ML SOLN USE 1 VIAL IN NEBULIZER DAILY  . arformoterol (BROVANA) 15 MCG/2ML NEBU Take 2 mLs (15 mcg total) by nebulization 2 (two) times daily.  . Revefenacin (YUPELRI) 175 MCG/3ML SOLN Inhale 1 puff into the lungs daily.  . [DISCONTINUED] arformoterol (BROVANA) 15 MCG/2ML NEBU Take 2 mLs (15 mcg total) by nebulization 2 (two) times daily.  . [DISCONTINUED] arformoterol (BROVANA) 15 MCG/2ML NEBU Take 2 mLs (15 mcg total) by nebulization 2 (two) times daily.   No facility-administered encounter medications on file as of 02/28/2019.      Review of Systems  Review of Systems  Constitutional: Positive for fatigue. Negative  for activity change, chills, diaphoresis, fever and unexpected weight change.  HENT: Positive for congestion and trouble swallowing (History of aspiration pneumonia). Negative for rhinorrhea, sinus pain, sneezing and sore throat.   Eyes: Negative.   Respiratory: Positive for cough, shortness of breath and wheezing.   Cardiovascular: Positive for leg swelling (Wears compression stockings). Negative for chest pain and palpitations.  Gastrointestinal: Negative for diarrhea, nausea and vomiting.  Endocrine: Negative.   Genitourinary: Negative.   Musculoskeletal: Negative.  Negative for arthralgias.  Skin: Negative.   Allergic/Immunologic: Negative.   Neurological: Negative.  Negative for dizziness and headaches.  Hematological: Negative.   Psychiatric/Behavioral: Positive for  confusion (Baseline). Negative for dysphoric mood. The patient is not nervous/anxious.   All other systems reviewed and are negative.    Physical Exam  BP 118/68 (BP Location: Left Arm, Patient Position: Sitting, Cuff Size: Normal)   Pulse 68   Temp (!) 97.5 F (36.4 C) (Oral)   Ht 5\' 6"  (1.676 m)   Wt 206 lb (93.4 kg)   SpO2 98%   BMI 33.25 kg/m   Wt Readings from Last 5 Encounters:  02/28/19 206 lb (93.4 kg)  01/30/19 209 lb (94.8 kg)  01/11/19 207 lb 8 oz (94.1 kg)  10/09/18 207 lb (93.9 kg)  09/27/18 209 lb 3.2 oz (94.9 kg)     Physical Exam  Constitutional: He is oriented to person, place, and time and well-developed, well-nourished, and in no distress. No distress.  Chronically ill elderly male  HENT:  Head: Normocephalic and atraumatic.  Right Ear: Hearing, tympanic membrane, external ear and ear canal normal.  Left Ear: Hearing, tympanic membrane, external ear and ear canal normal.  Nose: Nose normal.  Mouth/Throat: Uvula is midline and oropharynx is clear and moist. No oropharyngeal exudate.  Eyes: Pupils are equal, round, and reactive to light.  Neck: Normal range of motion. Neck supple.   Cardiovascular: Normal rate, regular rhythm and normal heart sounds.  Pulmonary/Chest: Effort normal. No accessory muscle usage. No respiratory distress. He has no decreased breath sounds. He has no wheezes. He has no rhonchi. He has rales (Basilar, right greater than left).  Abdominal: Soft. Bowel sounds are normal. He exhibits no distension. There is no abdominal tenderness.  Musculoskeletal: Normal range of motion.        General: Edema (Wearing compression stockings, baseline lower extremity swelling) present.  Lymphadenopathy:    He has no cervical adenopathy.  Neurological: He is alert and oriented to person, place, and time. Gait normal.  Skin: Skin is warm and dry. He is not diaphoretic. No erythema.  Psychiatric: Mood and memory normal. He has a flat affect.  Baseline, confusion  Nursing note and vitals reviewed.     Lab Results:  CBC    Component Value Date/Time   WBC 8.7 08/14/2018 0339   RBC 4.27 08/14/2018 0339   HGB 13.0 08/14/2018 0339   HCT 39.2 08/14/2018 0339   PLT 255 08/14/2018 0339   MCV 91.8 08/14/2018 0339   MCH 30.4 08/14/2018 0339   MCHC 33.2 08/14/2018 0339   RDW 13.9 08/14/2018 0339   LYMPHSABS 2.4 08/13/2018 1513   MONOABS 1.2 (H) 08/13/2018 1513   EOSABS 0.6 (H) 08/13/2018 1513   BASOSABS 0.1 08/13/2018 1513    BMET    Component Value Date/Time   NA 136 08/24/2018 1212   NA 137 06/27/2018   K 4.4 08/24/2018 1212   CL 102 08/24/2018 1212   CO2 28 08/24/2018 1212   GLUCOSE 81 08/24/2018 1212   BUN 19 08/24/2018 1212   BUN 15 06/27/2018   CREATININE 1.04 08/24/2018 1212   CREATININE 1.02 09/07/2015 1701   CALCIUM 8.3 (L) 08/24/2018 1212   GFRNONAA >60 08/13/2018 1513   GFRAA >60 08/13/2018 1513    BNP    Component Value Date/Time   BNP 99.8 08/13/2018 1513    ProBNP    Component Value Date/Time   PROBNP 39.0 08/24/2018 1212      Assessment & Plan:   IPF (idiopathic pulmonary fibrosis) ([place]) Assessment: 2014 CT chest  without contrast shows potential ILD or NSIP No mention of ILD on 2020 CTA  chest Bibasilar rales on exam today, right greater than left  Plan: Chest x-ray today Continue to monitor clinically May need to consider CT in the future based off the chest x-ray today  Other emphysema Plan: Maintained on Yupelri Using albuterol every 6 hours Lung sounds fairly clear, bibasilar rales right greater than left  Plan: Continue Yupelri, samples provided today Trial of Brovana, samples provided today Spouse to contact our office next week to update regarding Brovana Follow-up in 6 to 8 weeks  Aspiration pneumonia ([place]) Assessment: History of aspiration pneumonia Recent pneumonia on chest x-ray May/2020 Empiric treatment with Augmentin telephonically last week with fevers June/2019 swallow study shows moderate aspiration risk  Plan: Chest x-ray today Swallow study ordered today Finish Augmentin  History of recent pneumonia Plan: Chest x-ray today Swallow study today    Return in about 2 months (around 05/01/2019), or if symptoms worsen or fail to improve, for Follow up with Dr. Purnell Shoemaker.   Lauraine Rinne, NP 02/28/2019   This appointment was 28 minutes long with over 50% of the time in direct face-to-face patient care, assessment, plan of care, and follow-up.

## 2019-02-28 ENCOUNTER — Other Ambulatory Visit: Payer: Self-pay

## 2019-02-28 ENCOUNTER — Ambulatory Visit: Payer: Medicare Other | Admitting: Pulmonary Disease

## 2019-02-28 ENCOUNTER — Encounter: Payer: Self-pay | Admitting: Pulmonary Disease

## 2019-02-28 ENCOUNTER — Other Ambulatory Visit (HOSPITAL_COMMUNITY): Payer: Self-pay

## 2019-02-28 ENCOUNTER — Ambulatory Visit (INDEPENDENT_AMBULATORY_CARE_PROVIDER_SITE_OTHER): Payer: Medicare Other

## 2019-02-28 VITALS — BP 118/68 | HR 68 | Temp 97.5°F | Ht 66.0 in | Wt 206.0 lb

## 2019-02-28 DIAGNOSIS — J690 Pneumonitis due to inhalation of food and vomit: Secondary | ICD-10-CM | POA: Diagnosis not present

## 2019-02-28 DIAGNOSIS — J8410 Pulmonary fibrosis, unspecified: Secondary | ICD-10-CM | POA: Diagnosis not present

## 2019-02-28 DIAGNOSIS — Z8701 Personal history of pneumonia (recurrent): Secondary | ICD-10-CM

## 2019-02-28 DIAGNOSIS — J439 Emphysema, unspecified: Secondary | ICD-10-CM | POA: Diagnosis not present

## 2019-02-28 DIAGNOSIS — J438 Other emphysema: Secondary | ICD-10-CM

## 2019-02-28 DIAGNOSIS — J84112 Idiopathic pulmonary fibrosis: Secondary | ICD-10-CM | POA: Diagnosis not present

## 2019-02-28 DIAGNOSIS — R1310 Dysphagia, unspecified: Secondary | ICD-10-CM

## 2019-02-28 MED ORDER — ARFORMOTEROL TARTRATE 15 MCG/2ML IN NEBU: 15.0000 ug | INHALATION_SOLUTION | Freq: Two times a day (BID) | RESPIRATORY_TRACT | 0 refills | Status: DC

## 2019-02-28 MED ORDER — YUPELRI 175 MCG/3ML IN SOLN: 1.0000 | Freq: Every day | RESPIRATORY_TRACT | 0 refills | Status: DC

## 2019-02-28 MED ORDER — ARFORMOTEROL TARTRATE 15 MCG/2ML IN NEBU: 15.0000 ug | INHALATION_SOLUTION | Freq: Two times a day (BID) | RESPIRATORY_TRACT | 6 refills | Status: DC

## 2019-02-28 NOTE — Assessment & Plan Note (Signed)
Assessment: 2014 CT chest without contrast shows potential ILD or NSIP No mention of ILD on 2020 CTA chest Bibasilar rales on exam today, right greater than left  Plan: Chest x-ray today Continue to monitor clinically May need to consider CT in the future based off the chest x-ray today

## 2019-02-28 NOTE — Patient Instructions (Addendum)
Finish Augmentin  Chest Xray today   Swallow eval ordered today  Continue Yupelri nebulized medication daily >>> Samples provided today  Trial of Brovana >>>Use 1 ampule every 12 hours >>> Samples provided today >>> Contact our office next week and let us know how you are doing on it  Can use albuterol every 6 hours nebulized as needed for shortness of breath and wheezing  Continue oxygen therapy as prescribed  >>>maintain oxygen saturations greater than 88 percent  >>>if unable to maintain oxygen saturations please contact the office  >>>do not smoke with oxygen  >>>can use nasal saline gel or nasal saline rinses to moisturize nose if oxygen causes dryness    If symptoms worsen or you have concerns about cough, fatigue, body aches then present to an emergency room or contact our office  Return in about 2 months (around 05/01/2019), or if symptoms worsen or fail to improve, for Follow up with Dr. Purnell Shoemaker.   Coronavirus (COVID-19) Are you at risk?  Are you at risk for the Coronavirus (COVID-19)?  To be considered HIGH RISK for Coronavirus (COVID-19), you have to meet the following criteria:  . Traveled to [place], [place], [place], [place] or [place]; or in the [place] to [place], [place], [place], or [place]; and have fever, cough, and shortness of breath within the last 2 weeks of travel OR . Been in close contact with a person diagnosed with COVID-19 within the last 2 weeks and have fever, cough, and shortness of breath . IF YOU DO NOT MEET THESE CRITERIA, YOU ARE CONSIDERED LOW RISK FOR COVID-19.  What to do if you are HIGH RISK for COVID-19?  [name] If you are having a medical emergency, call 911. . Seek medical care right away. Before you go to a doctor's office, urgent care or emergency department, call ahead and tell them about your recent travel, contact with someone diagnosed with COVID-19, and your symptoms. You should receive instructions from your  physician's office regarding next steps of care.  . When you arrive at healthcare provider, tell the healthcare staff immediately you have returned from visiting [place], [place], [place], [place] or [place]; or traveled in the [place] to [place], [place], [place], or [place]; in the last two weeks or you have been in close contact with a person diagnosed with COVID-19 in the last 2 weeks.   . Tell the health care staff about your symptoms: fever, cough and shortness of breath. . After you have been seen by a medical provider, you will be either: o Tested for (COVID-19) and discharged home on quarantine except to seek medical care if symptoms worsen, and asked to  - Stay home and avoid contact with others until you get your results (4-5 days)  - Avoid travel on public transportation if possible (such as bus, train, or airplane) or o Sent to the Emergency Department by EMS for evaluation, COVID-19 testing, and possible admission depending on your condition and test results.  What to do if you are LOW RISK for COVID-19?  Reduce your risk of any infection by using the same precautions used for avoiding the common cold or flu:  [name] Wash your hands often with soap and warm water for at least 20 seconds.  If soap and water are not readily available, use an alcohol-based hand sanitizer with at least 60% alcohol.  . If coughing or sneezing, cover your mouth and nose by coughing or sneezing into the elbow  areas of your shirt or coat, into a tissue or into your sleeve (not your hands). . Avoid shaking hands with others and consider head nods or verbal greetings only. . Avoid touching your eyes, nose, or mouth with unwashed hands.  . Avoid close contact with people who are sick. . Avoid places or events with large numbers of people in one location, like concerts or sporting events. . Carefully consider travel plans you have or are making. . If you are planning any travel outside or inside the [place],  visit the CDC's Travelers' Health webpage for the latest health notices. . If you have some symptoms but not all symptoms, continue to monitor at home and seek medical attention if your symptoms worsen. . If you are having a medical emergency, call 911.   [place] / e-Visit: [email or web address]         [facility]: [facility]: [phone]                   [facility]: [phone]           It is flu season:   >>> Best ways to protect herself from the flu: Receive the yearly flu vaccine, practice good hand hygiene washing with soap and also using hand sanitizer when available, eat a nutritious meals, get adequate rest, hydrate appropriately   Please contact the office if your symptoms worsen or you have concerns that you are not improving.   Thank you for choosing [facility] for your healthcare, and for allowing [place] to partner with you on your healthcare journey. I am thankful to be able to provide care to you today.   Wyn Quaker FNP-C

## 2019-02-28 NOTE — Assessment & Plan Note (Signed)
Plan: Chest x-ray today Swallow study today

## 2019-02-28 NOTE — Assessment & Plan Note (Signed)
Assessment: History of aspiration pneumonia Recent pneumonia on chest x-ray May/2020 Empiric treatment with Augmentin telephonically last week with fevers June/2019 swallow study shows moderate aspiration risk  Plan: Chest x-ray today Swallow study ordered today Finish Augmentin

## 2019-02-28 NOTE — Progress Notes (Signed)
Good news is his chest x-ray does not show any sort of consolidation.  This is good news.  Does still show the chronic fibrosis as well as emphysema.  Finish the Augmentin as currently ordered.  And proceed forward with swallow eval.  Wyn Quaker, FNP

## 2019-02-28 NOTE — Assessment & Plan Note (Signed)
Plan: Maintained on Yupelri Using albuterol every 6 hours Lung sounds fairly clear, bibasilar rales right greater than left  Plan: Continue Yupelri, samples provided today Trial of Brovana, samples provided today Spouse to contact our office next week to update regarding Brovana Follow-up in 6 to 8 weeks

## 2019-03-05 ENCOUNTER — Other Ambulatory Visit: Payer: Self-pay

## 2019-03-05 ENCOUNTER — Ambulatory Visit (HOSPITAL_COMMUNITY)
Admission: RE | Admit: 2019-03-05 | Discharge: 2019-03-05 | Disposition: A | Discharge status: Home / Self Care | Payer: Medicare Other | Source: Ambulatory Visit | Attending: Pulmonary Disease | Admitting: Pulmonary Disease

## 2019-03-05 DIAGNOSIS — R1310 Dysphagia, unspecified: Secondary | ICD-10-CM | POA: Diagnosis present

## 2019-03-05 DIAGNOSIS — J690 Pneumonitis due to inhalation of food and vomit: Secondary | ICD-10-CM

## 2019-03-08 ENCOUNTER — Telehealth: Payer: Self-pay | Admitting: Internal Medicine

## 2019-03-08 DIAGNOSIS — J8410 Pulmonary fibrosis, unspecified: Secondary | ICD-10-CM

## 2019-03-08 DIAGNOSIS — J439 Emphysema, unspecified: Secondary | ICD-10-CM

## 2019-03-08 DIAGNOSIS — J438 Other emphysema: Secondary | ICD-10-CM

## 2019-03-08 NOTE — Telephone Encounter (Signed)
Called and spoke with pt's wife Enid Derry letting her know the info from [facility] and that pt could do Yuperli right after Garlon Hatchet that it did not need to be separated for any particular time frame. Stated to her that the Albuterol was to be used every 6 hours as needed. Asked Enid Derry if she was fine with [place] sending Rx for Brovana to [facility] for pt and she wanted to know about how much it would cost her first due to pt having a high cost with other solutions when it was sent to local pharmacy and pt's Maretta Bees which he already receives from [facility] is roughly $235 for a 30day supply. I stated to Enid Derry that I would call [facility] and call her back once I figure out the cost and then will call her back to let her know what I find out and see if she would be fine with Rx gong to [facility]. Enid Derry verbalized understanding.   Attempted to call Caryl Pina with [facility] but he was currently out of the office but would return to office Monday, 7/20. I left a message for Caryl Pina to return my call when he returned to the office Monday, 7/20 so I can discuss pt's neb sol with him. Also called Enid Derry letting her know that I would call her back Monday when I had info for her and she stated that would be fine as pt had enough Brovana to get him through the weekend from samples that we gave him.

## 2019-03-08 NOTE — Telephone Encounter (Signed)
Assessment & Plan Note by Lauraine Rinne, NP at 02/28/2019 3:16 PM Author: Lauraine Rinne, NP Author Type: Nurse Practitioner Filed: 02/28/2019 3:17 PM  Note Status: Written Cosign: Cosign Not Required Encounter Date: 02/28/2019  Problem: Other emphysema  Editor: Lauraine Rinne, NP (Nurse Practitioner)    Plan: Maintained on Maretta Bees Using albuterol every 6 hours Lung sounds fairly clear, bibasilar rales right greater than left  Plan: Continue Yupelri, samples provided today Trial of Brovana, samples provided today Spouse to contact our office next week to update regarding Brovana Follow-up in 6 to 8 weeks      Called and spoke with pt's wife Enid Derry. Enid Derry stated that she believes Garlon Hatchet is working well for pt. Pt is still coughing but it does seem like the Garlon Hatchet is working to help break up the congestion. When pt coughs, Enid Derry states that pt is still swallowing most of the mucus like he had been doing prior. Pt still has some occ wheezing as she woke up at 3am this morning to do an albuterol treatment and then after that, pt was able to go back to sleep.  Enid Derry is wanting to know exactly how many hours between the [place] should she do the [place] and also the albuterol neb solutions. [name], please advise on this. Thanks!

## 2019-03-08 NOTE — Telephone Encounter (Signed)
Glad to hear patient is tolerating this well.  We can offer to send a prescription to [facility].  We can also offered to send patient to Maretta Bees through [facility] as well.Patient should take Yupelri daily, and Brovana every 12 hours.  They can take these medications back to back they do not need to be separated for a particular number of hours.  Can also use albuterol nebulized meds every 6 hours as needed for shortness of breath and wheezing.  Wyn Quaker FNP

## 2019-03-11 MED ORDER — ARFORMOTEROL TARTRATE 15 MCG/2ML IN NEBU: 15.0000 ug | INHALATION_SOLUTION | Freq: Two times a day (BID) | RESPIRATORY_TRACT | 11 refills | Status: DC

## 2019-03-11 NOTE — Telephone Encounter (Signed)
Return call on this pt from  [place] [phone].Hillery Hunter

## 2019-03-11 NOTE — Addendum Note (Signed)
Addended by: Lorretta Harp on: 03/11/2019 11:02 AM   Modules accepted: Orders

## 2019-03-11 NOTE — Telephone Encounter (Signed)
Called and spoke with Caryl Pina from [facility]. Per Caryl Pina, pt is actually with [facility] in [place] not the [place] office. Caryl Pina did help me out though by letting me know that if pt did receive Brovana through [facility] ([facility]), it would be $135.  Called and spoke with pt's wife Enid Derry letting her know that I found out the cost for Garlon Hatchet will be $135 through [facility] and she verbalized understanding. Enid Derry stated to go ahead and send Rx to [facility] for the med. Due to pt being with [facility], Rx has been printed and signed by [name] and has been faxed to [facility] ([facility]). Nothing further needed.

## 2019-03-13 ENCOUNTER — Telehealth: Payer: Self-pay | Admitting: Internal Medicine

## 2019-03-13 NOTE — Telephone Encounter (Signed)
Left detailed message: pharmacist had to verify medication and they were awaiting chart notes. Meds will be shipped out overnight and should arrive tommorow. Sorry for inconvenience. Nothing further needed at this time.

## 2019-03-13 NOTE — Telephone Encounter (Signed)
PT IS OUT OF MED AND WIFE CAN BE REACHED @ [phone] IT'S OK TO LEAVE MSG IF NO ANSWER.Lee [name]

## 2019-03-13 NOTE — Progress Notes (Signed)
Swallow study results have come back.  Per speech therapist patient is a mild aspiration risk with no current limitations.  They are not recommending any treatment therapies at this time.  Patient should follow a dysphagia 3 (mechanical soft) solids as well as thin liquids, can use cup or straw, home meds with pured foods such as applesauce, minimize environmental distractions, eat at a slow rate with small sips and bites.  Remain upright during and after feeds/meals.   Wyn Quaker, FNP

## 2019-03-18 NOTE — Progress Notes (Signed)
See result note.   B

## 2019-03-19 NOTE — Progress Notes (Signed)
Call made to patient wife (DPR), made aware of results per BM. Voiced understanding. Nothing further needed at this time. She did request that we mail her info about aspiration and dysphagia. Info printed and mailed. Nothing further needed at this time.

## 2019-03-19 NOTE — Progress Notes (Signed)
Thank you for all you did for the patient and spouse.  Thank you for sending this information.  [name]

## 2019-04-02 ENCOUNTER — Telehealth: Payer: Self-pay | Admitting: Pulmonary Disease

## 2019-04-02 ENCOUNTER — Ambulatory Visit (INDEPENDENT_AMBULATORY_CARE_PROVIDER_SITE_OTHER): Payer: Medicare Other

## 2019-04-02 ENCOUNTER — Other Ambulatory Visit: Payer: Self-pay

## 2019-04-02 ENCOUNTER — Encounter: Payer: Self-pay | Admitting: Pulmonary Disease

## 2019-04-02 ENCOUNTER — Ambulatory Visit: Payer: Medicare Other | Admitting: Pulmonary Disease

## 2019-04-02 VITALS — BP 138/66 | HR 75 | Temp 98.4°F | Ht 66.0 in | Wt 214.0 lb

## 2019-04-02 DIAGNOSIS — R059 Cough, unspecified: Secondary | ICD-10-CM

## 2019-04-02 DIAGNOSIS — J8410 Pulmonary fibrosis, unspecified: Secondary | ICD-10-CM

## 2019-04-02 DIAGNOSIS — J84112 Idiopathic pulmonary fibrosis: Secondary | ICD-10-CM

## 2019-04-02 DIAGNOSIS — R05 Cough: Secondary | ICD-10-CM

## 2019-04-02 DIAGNOSIS — J438 Other emphysema: Secondary | ICD-10-CM

## 2019-04-02 DIAGNOSIS — R0609 Other forms of dyspnea: Secondary | ICD-10-CM | POA: Diagnosis not present

## 2019-04-02 DIAGNOSIS — J439 Emphysema, unspecified: Secondary | ICD-10-CM

## 2019-04-02 MED ORDER — ARFORMOTEROL TARTRATE 15 MCG/2ML IN NEBU: 15.0000 ug | INHALATION_SOLUTION | Freq: Two times a day (BID) | RESPIRATORY_TRACT | 0 refills | Status: DC

## 2019-04-02 MED ORDER — AMOXICILLIN-POT CLAVULANATE 875-125 MG PO TABS: 1.0000 | ORAL_TABLET | Freq: Two times a day (BID) | ORAL | 0 refills | Status: DC

## 2019-04-02 MED ORDER — YUPELRI 175 MCG/3ML IN SOLN: 1.0000 | Freq: Every day | RESPIRATORY_TRACT | 0 refills | Status: AC

## 2019-04-02 NOTE — Telephone Encounter (Addendum)
Pt completed CXR after his appointment today 04/02/2019. Nothing further needed at this time.

## 2019-04-02 NOTE — Telephone Encounter (Signed)
Needs Chest Xray prior. Stat.   [name]

## 2019-04-02 NOTE — Telephone Encounter (Signed)
Appointment has already been completed.

## 2019-04-02 NOTE — Assessment & Plan Note (Addendum)
Plan: Chest x-ray today clear Augmentin today We will continue to monitor clinically May need to proceed forward with CT imaging in the future to further evaluate breathing basilar atelectasis

## 2019-04-02 NOTE — Progress Notes (Signed)
Slight increase of bibasilar atelectasis.  Basically means lungs not fully expanding.  No changes with plan of care.  Continue forward with Augmentin.  Wyn Quaker, FNP

## 2019-04-02 NOTE — Telephone Encounter (Signed)
Called and spoke w/ pt spouse, Enid Derry (on [place]). Enid Derry states pt has had a nonproductive, congestive cough and lethargy since last night.. She denies any acute symptoms or fever/chills/muscle aches for pt. She states she would like for him to be seen today, therefore, I offered an appt w/ [facility] at 3:30 PM EDT. Pt and pt spouse are NEG for COVID Qs. Will route this message to [name] as an [place]. Nothing further needed at this time.

## 2019-04-02 NOTE — Assessment & Plan Note (Signed)
Plan: Start Augmentin today Lab work Chest x-ray today clear Close follow-up with our office

## 2019-04-02 NOTE — Assessment & Plan Note (Signed)
Plan: Chest x-ray today clear Augmentin for 14 days to start today We will do blood work tomorrow

## 2019-04-02 NOTE — Progress Notes (Signed)
@Patient  ID: Lee [name], male    DOB: December 09, 1930, 85 y.o.   MRN: [id number]  Chief Complaint  Patient presents with  . Follow-up    increased SOB and congestions x 2 days    Referring provider: [name], MD  HPI:  85 year old former smoker followed in our office for ILD and emphysema   PMH: Stroke, PE (on eliquis), CHF, Cardiomyopathy, Chronic Cough Smoker/ Smoking History: Former Smoker. 25 pack year history Maintenance:   Candiss Norse  Pt of: Dr. Chase Caller  04/02/2019  - Visit   85 year old male former smoker followed in our office for ILD as well as emphysema.  Patient also has recurrent bronchitis-like episodes that are believed to be suspected aspiration.  Patient has recurrent swallow studies that report that he has a mild aspiration risk.  Patient presenting to our office today with his spouse who had concerns regarding the patient's worsened dyspnea today as well as increased fatigue.  Patient remains afebrile.  Patient continues to have his baseline "wet sounding" cough.  Patient occasionally is able to bring up mucus that is yellow in color.  Patient also with audible wheezing per spouse.     Tests:   01/11/2019-chest x-ray- low volume AP exam with increased bilateral interstitial markings concerning for acutely superimposed infection or edema, underlying emphysematous change of fibrosis at bases, mild cardiomegaly  04/02/2019-chest x-ray- slight increase in bi plan:  01/26/2018- swallowing study-modified barium swallow- premature spill and delayed swallow trigger noted, no evidence of vestibular penetration or aspiration, please refer to speech pathologist report for complete details and recommendations, mild aspiration risk  03/05/2019-swallow study- mild aspiration risk, dysphagia 3, home meds with pure, oral care twice daily, can use cup or straw, home meds with pured foods, minimize environmental distractions, eat at slow rate small sips and bites   07/28/2017-chest x-ray- changes of pulmonary fibrosis are again seen, no acute disease  09/17/2012-CT chest without contrast- diffuse bronchial wall thickening and moderate centrilobular and paraseptal emphysema, compatible with underlying COPD, some patchy areas of very mild peripheral groundglass attenuation, subpleural reticulation and peripheral bronchiectasis atelectasis could suggest interstitial lung disease/NSIP  06/01/2017-echocardiogram- LV ejection fraction 50 to 81%, systolic function normal, grade 1 diastolic dysfunction  01/75/1025-EN Angio- small segmental subsegmental right lower lobe pulmonary emboli, moderate to severe emphysema, consolidation and groundglass density in the posterior right upper lobe consistent with pneumonia.  FENO:  No results found for: NITRICOXIDE  PFT: PFT Results Latest Ref Rng & Units 04/18/2014 09/19/2013  FVC-Pre L 3.10 3.06  FVC-Predicted Pre % 98 96  FVC-Post L 3.11 3.10  FVC-Predicted Post % 98 97  Pre FEV1/FVC % % 74 74  Post FEV1/FCV % % 76 69  FEV1-Pre L 2.28 2.28  FEV1-Predicted Pre % 103 102  FEV1-Post L 2.37 2.16  DLCO UNC% % 58 55  DLCO COR %Predicted % 77 80  TLC L 7.84 -  TLC % Predicted % 129 -  RV % Predicted % 165 -    Imaging: Dg Chest 2 View  Result Date: 04/02/2019 CLINICAL DATA:  Cough EXAM: CHEST - 2 VIEW COMPARISON:  02/28/2019 FINDINGS: Cardiac shadow is stable. Aortic calcifications are again seen. Patchy atelectasis is noted in the bases bilaterally slightly increased when compared with the prior exam. No sizable effusion is noted. No bony abnormality is seen. IMPRESSION: Slight increase in bibasilar atelectasis. Electronically Signed   By: Inez Catalina M.D.   On: 04/02/2019 16:34   Dg Swallow Func Op  [facility] Speech Path  Result Date: 03/05/2019 CLINICAL DATA:  Dysphagia. Cough/GE reflux disease/other secondary diagnosis EXAM: MODIFIED BARIUM SWALLOW TECHNIQUE: Different consistencies of barium were administered  orally to the patient by the Speech Pathologist. Imaging of the pharynx was performed in the lateral projection. The radiologist was present in the fluoroscopy room for this study, providing personal supervision. FLUOROSCOPY TIME:  Fluoroscopy Time:  1 minutes and 12 seconds Number of Acquired Spot Images: 0 COMPARISON:  None. FINDINGS: Please see the speech pathologist's report for details. IMPRESSION: Please see the speech pathologist's report for details. Please refer to the Speech Pathologists report for complete details and recommendations. Electronically Signed   By: Dorise Bullion III M.D   On: 03/05/2019 12:27 Objective Swallowing Evaluation: Type of Study: MBS-Modified Barium Swallow Study  Patient Details Name: Lee [name] MRN: [id number] Date of Birth: 1931/01/27 Today's Date: 03/05/2019 Time: SLP Start Time (ACUTE ONLY): 1140 -SLP Stop Time (ACUTE ONLY): 1200 SLP Time Calculation (min) (ACUTE ONLY): 20 min Past Medical History: Past Medical History: Diagnosis Date . Allergic rhinitis  . Anxiety  . Basal cell carcinoma of face  . Cardiomyopathy ([place]) 04/2015  EF 40-45% by echo. Apical anterior akinesis seen on echo, not confirmed on Myoview was negative for ischemia. Myoview suggested a small basal inferoseptal defect. . Cholecystitis   a. complex admit 04/2015 due to sepsis due to acute Escherichia coli cholecystitis and right lower lobe CAP and Citrobacter UTI. [name] Chronic respiratory failure ([place])   a. on home O2 after discharge 04/2015. [name] Chronic systolic CHF (congestive heart failure) ([place])   a. Dx 04/2015 during complex admission for cholecystitis - EF 40-45% +WMA. [name] COPD (chronic obstructive pulmonary disease) ([place])  . Erectile dysfunction  . History of endovascular stent graft for abdominal aortic aneurysm 2002  a. s/p repair  Followed by Dr. Donnetta Hutching (stable evaluation 01/2106) . History of hiatal hernia  . HTN (hypertension)  . Hyperlipidemia  . Interstitial lung disease ([place])   a. followed by pulm. [name]  LBBB (left bundle branch block)   a. Intermittent - seen in 2013, not in 09/2013, but again in 04/2015. . Obesity  . On home oxygen therapy   "just at night" (10/05/2015) . PVD (peripheral vascular disease) ([place])  . Shingles  . Stroke [facility])   a. H/o aphasia - was told he'd had ministrokes. Imaging 09/2014 revealed prior infarct. Past Surgical History: Past Surgical History: Procedure Laterality Date . AORTA SURGERY  2002 . CHOLECYSTECTOMY N/A 10/05/2015  Procedure: LAPAROSCOPIC CHOLECYSTECTOMY  ;  Surgeon: Autumn Messing III, MD;  Location: [facility];  Service: General;  Laterality: N/A; . [place] . INTRAOPERATIVE CHOLANGIOGRAM  10/05/2015  Procedure: INTRAOPERATIVE CHOLANGIOGRAM;  Surgeon: Autumn Messing III, MD;  Location: [facility];  Service: General;; . LAPAROSCOPIC CHOLECYSTECTOMY  10/05/2015 . NM MYOVIEW LTD  05/2015  EF 49%. Small size, mild severity perfusion defect in the basal-mid inferoseptal wall. LOW RISK. No ischemia. Defect thought to be related to LBBB artifacts and not true lesion. This could also explain mild systolic dysfunction. . TRANSTHORACIC ECHOCARDIOGRAM  04/2015  (In setting of acute illness).EF 40-45% with mid-apical anteroseptal akinesis. Moderately increased PA pressures of roughly 48 mmHg. [name] URETHRAL STRICTURE DILATATION  late 1970s  urinary tract stretch HPI: Patient is an 85 y.o. male who was accompanied to this outpatient MBS by his wife, who provided all pertinent information, as patient is unable. He was hospitalized in 07/2018 after several days of coughing and SOB, had a BSE  which did not reveal any overt s/s of aspiration or penetration and no further SLP intervention was needed at that time. Wife stated that patient continues to have coughing with meals and that his PCP recommended getting an MBS to r/o any dysphagia. She reported that he had an MBS at [facility] and no aspiration or penetration was observed. PMH: COPD, dementia, GERD, CVA, HH, CHF, anxiety.  Subjective: patient pleasant,  wife provides information Assessment / Plan / Recommendation CHL IP CLINICAL IMPRESSIONS 03/05/2019 Clinical Impression Patient presents with a mild oropharyngeal dysphagia without aspiration or penetration. He exhibited a mild delay in anterior to posterior propulsion of solid texture boluses in oral cavity, mild difficulty with bolus cohesion with puree solids and barium tablet. Pharyngeally, he exhibited delays of swallow initiation to level of vallecular sinus with all consistencies, and with thin liquids straw sips, delays to vallecular sinus heading into pyriform sinus. Trace vallecular residuals with puree solids and regular solids which cleared with subsequent swallows. Transition through U[facility] was [facility] without difficulty or noted transit delays in upper esophagus. Airway protection was very good even with successive straw sips.  SLP Visit Diagnosis Dysphagia, oropharyngeal phase (R13.12) Attention and concentration deficit following -- Frontal lobe and executive function deficit following -- Impact on safety and function Mild aspiration risk;No limitations   CHL IP TREATMENT RECOMMENDATION 03/05/2019 Treatment Recommendations No treatment recommended at this time   Prognosis 01/26/2018 Prognosis for Safe Diet Advancement Fair Barriers to Reach Goals Cognitive deficits Barriers/Prognosis Comment -- CHL IP DIET RECOMMENDATION 03/05/2019 SLP Diet Recommendations Dysphagia 3 (Mech soft) solids;Thin liquid Liquid Administration via Cup;Straw Medication Administration Whole meds with puree Compensations Minimize environmental distractions;Slow rate;Small sips/bites Postural Changes Remain semi-upright after after feeds/meals (Comment)   CHL IP OTHER RECOMMENDATIONS 03/05/2019 Recommended Consults -- Oral Care Recommendations Oral care BID Other Recommendations --   CHL IP FOLLOW UP RECOMMENDATIONS 03/05/2019 Follow up Recommendations None   No flowsheet data found.     CHL IP ORAL PHASE 03/05/2019 Oral Phase Impaired Oral -  Pudding Teaspoon -- Oral - Pudding Cup -- Oral - Honey Teaspoon -- Oral - Honey Cup -- Oral - Nectar Teaspoon -- Oral - Nectar Cup -- Oral - Nectar Straw -- Oral - Thin Teaspoon -- Oral - Thin Cup Weak lingual manipulation;Piecemeal swallowing Oral - Thin Straw Piecemeal swallowing;Weak lingual manipulation Oral - Puree Reduced posterior propulsion Oral - Mech Soft -- Oral - Regular Impaired mastication;Weak lingual manipulation;Reduced posterior propulsion Oral - Multi-Consistency -- Oral - Pill Delayed oral transit;Weak lingual manipulation Oral Phase - Comment --  CHL IP PHARYNGEAL PHASE 03/05/2019 Pharyngeal Phase Impaired Pharyngeal- Pudding Teaspoon -- Pharyngeal -- Pharyngeal- Pudding Cup -- Pharyngeal -- Pharyngeal- Honey Teaspoon -- Pharyngeal -- Pharyngeal- Honey Cup -- Pharyngeal -- Pharyngeal- Nectar Teaspoon -- Pharyngeal -- Pharyngeal- Nectar Cup -- Pharyngeal -- Pharyngeal- Nectar Straw -- Pharyngeal -- Pharyngeal- Thin Teaspoon -- Pharyngeal -- Pharyngeal- Thin Cup Delayed swallow initiation-vallecula Pharyngeal -- Pharyngeal- Thin Straw Delayed swallow initiation-vallecula;Delayed swallow initiation-pyriform sinuses;Pharyngeal residue - valleculae Pharyngeal -- Pharyngeal- Puree Delayed swallow initiation-vallecula Pharyngeal -- Pharyngeal- Mechanical Soft -- Pharyngeal -- Pharyngeal- Regular Delayed swallow initiation-vallecula;Pharyngeal residue - valleculae Pharyngeal -- Pharyngeal- Multi-consistency -- Pharyngeal -- Pharyngeal- Pill WFL Pharyngeal -- Pharyngeal Comment --  CHL IP CERVICAL ESOPHAGEAL PHASE 03/05/2019 Cervical Esophageal Phase WFL Pudding Teaspoon -- Pudding Cup -- Honey Teaspoon -- Honey Cup -- Nectar Teaspoon -- Nectar Cup -- Nectar Straw -- Thin Teaspoon -- Thin Cup -- Thin Straw -- Puree -- Mechanical Soft --  Regular -- Multi-consistency -- Pill -- Cervical Esophageal Comment -- Dannial Monarch 03/05/2019, 5:16 PM    Sonia Baller, MA, CCC-SLP Speech Therapy [facility] Acute  Rehab Pager: [phone]      Specialty Problems      Pulmonary Problems   Dyspnea on exertion   Other emphysema (HCC)   Cough   IPF (idiopathic pulmonary fibrosis) ([place])   CAP (community acquired pneumonia)    06/15/2018-CT Angio- small segmental subsegmental right lower lobe pulmonary emboli, moderate to severe emphysema, consolidation and groundglass density in the posterior right upper lobe consistent with pneumonia.      Aspiration pneumonia (HCC)   COPD exacerbation (HCC)   Coughing up blood      Allergies  Allergen Reactions  . Ciprofloxacin Other (See Comments)    Tingle feeling throughout body  . Tape Other (See Comments)    Redness, Please use "paper" tape    Immunization History  Administered Date(s) Administered  . Influenza Split 06/22/2012, 06/22/2013, 06/03/2015  . Influenza, High Dose Seasonal PF 04/22/2016, 05/03/2017, 06/18/2018  . Influenza-Unspecified 06/22/2014  . Pneumococcal Polysaccharide-23 06/22/2010  . Tdap 09/22/2012    Past Medical History:  Diagnosis Date  . Allergic rhinitis   . Anxiety   . Basal cell carcinoma of face   . Cardiomyopathy ([place]) 04/2015   EF 40-45% by echo. Apical anterior akinesis seen on echo, not confirmed on Myoview was negative for ischemia. Myoview suggested a small basal inferoseptal defect.  . Cholecystitis    a. complex admit 04/2015 due to sepsis due to acute Escherichia coli cholecystitis and right lower lobe CAP and Citrobacter UTI.  [name] Chronic respiratory failure ([place])    a. on home O2 after discharge 04/2015.  [name] Chronic systolic CHF (congestive heart failure) ([place])    a. Dx 04/2015 during complex admission for cholecystitis - EF 40-45% +WMA.  [name] COPD (chronic obstructive pulmonary disease) ([place])   . Erectile dysfunction   . History of endovascular stent graft for abdominal aortic aneurysm 2002   a. s/p repair  Followed by Dr. Donnetta Hutching (stable evaluation 01/2106)  . History of hiatal hernia   . HTN (hypertension)    . Hyperlipidemia   . Interstitial lung disease ([place])    a. followed by pulm.  [name] LBBB (left bundle branch block)    a. Intermittent - seen in 2013, not in 09/2013, but again in 04/2015.  . Obesity   . On home oxygen therapy    "just at night" (10/05/2015)  . PVD (peripheral vascular disease) ([place])   . Shingles   . Stroke [facility])    a. H/o aphasia - was told he'd had ministrokes. Imaging 09/2014 revealed prior infarct.    Tobacco History: Social History   Tobacco Use  Smoking Status Former Smoker  . Packs/day: 1.00  . Years: 25.00  . Pack years: 25.00  . Types: Cigarettes  . Start date: 01/11/1951  . Quit date: 08/22/1974  . Years since quitting: 44.6  Smokeless Tobacco Never Used   Counseling given: Yes   Continue to not smoke  Outpatient Encounter Medications as of 04/02/2019  Medication Sig  . albuterol (PROVENTIL HFA;VENTOLIN HFA) 108 (90 Base) MCG/ACT inhaler Inhale 2 puffs into the lungs every 6 (six) hours as needed for wheezing or shortness of breath.  [name] albuterol (PROVENTIL) (2.5 MG/3ML) 0.083% nebulizer solution Take 3 mLs (2.5 mg total) by nebulization every 6 (six) hours as needed for wheezing or shortness of breath. (Patient taking differently: Take 2.5  mg by nebulization 2 (two) times daily. )  . apixaban (ELIQUIS) 5 MG TABS tablet Take 1 tablet (5 mg total) by mouth 2 (two) times daily. (Patient taking differently: Take 5 mg by mouth 2 (two) times daily with a meal. )  . arformoterol (BROVANA) 15 MCG/2ML NEBU Take 2 mLs (15 mcg total) by nebulization 2 (two) times daily.  . carvedilol (COREG) 12.5 MG tablet TAKE 1 TABLET (12.5 MG TOTAL) BY MOUTH 2 (TWO) TIMES DAILY WITH A MEAL. (Patient taking differently: Take 12.5 mg by mouth 2 (two) times daily with a meal. )  . cholecalciferol (VITAMIN D) 1000 units tablet Take 1 tablet (1,000 Units total) by mouth daily. (Patient taking differently: Take 1,000 Units by mouth daily with supper. )  . clopidogrel (PLAVIX) 75 MG tablet  Take 1 tablet (75 mg total) by mouth daily. (Patient taking differently: Take 75 mg by mouth daily at 3 pm. )  . ezetimibe (ZETIA) 10 MG tablet Take 1 tablet (10 mg total) by mouth daily.  . feeding supplement, ENSURE ENLIVE, (ENSURE ENLIVE) LIQD Take 237 mLs by mouth 2 (two) times daily between meals. (Patient taking differently: Take 237 mLs by mouth daily at 3 pm. )  . furosemide (LASIX) 20 MG tablet Take 1 tablet (20 mg total) by mouth every Monday, Wednesday, and Friday.  . Guaifenesin (MUCINEX MAXIMUM STRENGTH) 1200 MG TB12 Take 1,200 mg by mouth 2 (two) times daily.  [name] losartan (COZAAR) 50 MG tablet Take 1 tablet (50 mg total) by mouth daily.  . Multiple Vitamin (MULTIVITAMIN WITH MINERALS) TABS tablet Take 1 tablet by mouth daily.  . nitroGLYCERIN (NITROSTAT) 0.4 MG SL tablet Place 1 tablet (0.4 mg total) under the tongue every 5 (five) minutes as needed for chest pain.  . pantoprazole (PROTONIX) 40 MG tablet Take 1 tablet (40 mg total) by mouth daily. (Patient taking differently: Take 40 mg by mouth daily before breakfast. )  . Pitavastatin Calcium (LIVALO) 2 MG TABS Take 1 tablet (2 mg total) by mouth See admin instructions. Every 3rd day (Patient taking differently: Take 1 mg by mouth every 3 (three) days. )  . potassium chloride (MICRO-K) 10 MEQ CR capsule Take 1 capsule (10 mEq total) by mouth every Monday, Wednesday, and Friday. **Take with lasix**  . Probiotic Product ([place]) CAPS Take 1 capsule by mouth daily.  . Revefenacin (YUPELRI) 175 MCG/3ML SOLN Inhale 1 vial into the lungs daily.  . Revefenacin (YUPELRI) 175 MCG/3ML SOLN Inhale 1 puff into the lungs daily.  . YUPELRI 175 MCG/3ML SOLN USE 1 VIAL IN NEBULIZER DAILY  . [DISCONTINUED] amoxicillin-clavulanate (AUGMENTIN) 500-125 MG tablet Take 1 tablet (500 mg total) by mouth 3 (three) times daily.  [name] amoxicillin-clavulanate (AUGMENTIN) 875-125 MG tablet Take 1 tablet by mouth 2 (two) times daily.  [name] arformoterol  (BROVANA) 15 MCG/2ML NEBU Take 2 mLs (15 mcg total) by nebulization 2 (two) times daily for 1 day.  . Revefenacin (YUPELRI) 175 MCG/3ML SOLN Inhale 1 vial into the lungs daily for 1 day.   No facility-administered encounter medications on file as of 04/02/2019.      Review of Systems  Review of Systems  Constitutional: Positive for fatigue. Negative for activity change, chills, fever and unexpected weight change.  HENT: Positive for congestion. Negative for postnasal drip, rhinorrhea, sinus pressure, sinus pain and sore throat.   Eyes: Negative.   Respiratory: Positive for cough and shortness of breath. Negative for wheezing.   Cardiovascular: Negative for chest  pain, palpitations and leg swelling.  Gastrointestinal: Negative for diarrhea, nausea and vomiting.  Endocrine: Negative.   Genitourinary: Negative.   Musculoskeletal: Negative.   Skin: Negative.   Neurological: Negative for dizziness and headaches.  Psychiatric/Behavioral: Negative.  Negative for dysphoric mood. The patient is not nervous/anxious.   All other systems reviewed and are negative.    Physical Exam  BP 138/66 (BP Location: Right Arm, Patient Position: Sitting, Cuff Size: Normal)   Pulse 75   Temp 98.4 F (36.9 C)   Ht 5\' 6"  (1.676 m)   Wt 214 lb (97.1 kg)   SpO2 95%   BMI 34.54 kg/m   Wt Readings from Last 5 Encounters:  04/02/19 214 lb (97.1 kg)  02/28/19 206 lb (93.4 kg)  01/30/19 209 lb (94.8 kg)  01/11/19 207 lb 8 oz (94.1 kg)  10/09/18 207 lb (93.9 kg)     Physical Exam Vitals signs and nursing note reviewed.  Constitutional:      Appearance: Normal appearance. He is obese. He is toxic-appearing.  HENT:     Head: Normocephalic and atraumatic.     Right Ear: Hearing, tympanic membrane, ear canal and external ear normal.     Left Ear: Hearing, tympanic membrane, ear canal and external ear normal.     Nose: Nose normal. No mucosal edema or rhinorrhea.     Right Turbinates: Not enlarged.      Left Turbinates: Not enlarged.     Mouth/Throat:     Mouth: Mucous membranes are dry.     Pharynx: Oropharynx is clear. No oropharyngeal exudate.  Eyes:     Pupils: Pupils are equal, round, and reactive to light.  Neck:     Musculoskeletal: Normal range of motion.  Cardiovascular:     Rate and Rhythm: Normal rate and regular rhythm.     Pulses: Normal pulses.     Heart sounds: Normal heart sounds. No murmur.  Pulmonary:     Effort: Pulmonary effort is normal.     Breath sounds: Rales (bibasilar ) present. No decreased breath sounds or wheezing.  Abdominal:     General: Bowel sounds are normal. There is no distension.     Palpations: Abdomen is soft.     Tenderness: There is no abdominal tenderness.  Musculoskeletal:     Right lower leg: No edema (Compression stockings applied).     Left lower leg: No edema (Compression stockings applied).  Lymphadenopathy:     Cervical: No cervical adenopathy.  Skin:    General: Skin is warm and dry.     Capillary Refill: Capillary refill takes less than 2 seconds.     Findings: No erythema or rash.  Neurological:     General: No focal deficit present.     Mental Status: He is alert and oriented to person, place, and time.     Motor: No weakness.     Coordination: Coordination normal.     Gait: Gait is intact. Gait normal.  Psychiatric:        Mood and Affect: Mood normal.        Speech: Speech is delayed.        Behavior: Behavior normal. Behavior is cooperative.        Thought Content: Thought content normal.        Cognition and Memory: Cognition is impaired.      Lab Results:  CBC    Component Value Date/Time   WBC 8.7 08/14/2018 0339   RBC 4.27 08/14/2018 0339  HGB 13.0 08/14/2018 0339   HCT 39.2 08/14/2018 0339   PLT 255 08/14/2018 0339   MCV 91.8 08/14/2018 0339   MCH 30.4 08/14/2018 0339   MCHC 33.2 08/14/2018 0339   RDW 13.9 08/14/2018 0339   LYMPHSABS 2.4 08/13/2018 1513   MONOABS 1.2 (H) 08/13/2018 1513    EOSABS 0.6 (H) 08/13/2018 1513   BASOSABS 0.1 08/13/2018 1513    BMET    Component Value Date/Time   NA 136 08/24/2018 1212   NA 137 06/27/2018   K 4.4 08/24/2018 1212   CL 102 08/24/2018 1212   CO2 28 08/24/2018 1212   GLUCOSE 81 08/24/2018 1212   BUN 19 08/24/2018 1212   BUN 15 06/27/2018   CREATININE 1.04 08/24/2018 1212   CREATININE 1.02 09/07/2015 1701   CALCIUM 8.3 (L) 08/24/2018 1212   GFRNONAA >60 08/13/2018 1513   GFRAA >60 08/13/2018 1513    BNP    Component Value Date/Time   BNP 99.8 08/13/2018 1513    ProBNP    Component Value Date/Time   PROBNP 39.0 08/24/2018 1212      Assessment & Plan:   IPF (idiopathic pulmonary fibrosis) ([place]) Plan: Chest x-ray today clear Augmentin today We will continue to monitor clinically May need to proceed forward with CT imaging in the future to further evaluate breathing basilar atelectasis  Cough Plan: Chest x-ray today clear Augmentin for 14 days to start today We will do blood work tomorrow  Dyspnea on exertion Plan: Start Augmentin today Lab work Chest x-ray today clear Close follow-up with our office  Other emphysema Plan: Continue Yupelri Continue Brovana Augmentin today Follow-up in 4 weeks    Return in about 4 weeks (around 04/30/2019), or if symptoms worsen or fail to improve, for Follow up with Wyn Quaker FNP-C.   Lauraine Rinne, NP 04/02/2019   This appointment was 28 minutes long with over 50% of the time in direct face-to-face patient care, assessment, plan of care, and follow-up.

## 2019-04-02 NOTE — Patient Instructions (Addendum)
Chest x-ray today  Lab work today  Augmentin >>> Take 1 875-125 mg tablet every 12 hours for the next 14 days >>> Take with food   Continue Yupelri nebulized medication daily >>> Samples provided today  Continue Brovana >>>Use 1 ampule every 12 hours >>> Samples provided today >>> Contact our office next week and let us know how you are doing on it  Can use albuterol every 6 hours nebulized as needed for shortness of breath and wheezing  Continue oxygen therapy as prescribed  >>>maintain oxygen saturations greater than 88 percent  >>>if unable to maintain oxygen saturations please contact the office  >>>do not smoke with oxygen  >>>can use nasal saline gel or nasal saline rinses to moisturize nose if oxygen causes dryness   Return in about 4 weeks (around 04/30/2019), or if symptoms worsen or fail to improve, for Follow up with Wyn Quaker FNP-C.

## 2019-04-02 NOTE — Assessment & Plan Note (Signed)
Plan: Continue Yupelri Continue Brovana Augmentin today Follow-up in 4 weeks

## 2019-04-03 ENCOUNTER — Other Ambulatory Visit (INDEPENDENT_AMBULATORY_CARE_PROVIDER_SITE_OTHER): Payer: Medicare Other

## 2019-04-03 ENCOUNTER — Telehealth: Payer: Self-pay | Admitting: Internal Medicine

## 2019-04-03 DIAGNOSIS — R059 Cough, unspecified: Secondary | ICD-10-CM

## 2019-04-03 DIAGNOSIS — J84112 Idiopathic pulmonary fibrosis: Secondary | ICD-10-CM

## 2019-04-03 DIAGNOSIS — I2609 Other pulmonary embolism with acute cor pulmonale: Secondary | ICD-10-CM

## 2019-04-03 DIAGNOSIS — R0609 Other forms of dyspnea: Secondary | ICD-10-CM

## 2019-04-03 DIAGNOSIS — R05 Cough: Secondary | ICD-10-CM | POA: Diagnosis not present

## 2019-04-03 LAB — BASIC METABOLIC PANEL
BUN: 15 mg/dL (ref 6–23)
CO2: 26 mEq/L (ref 19–32)
Calcium: 9.4 mg/dL (ref 8.4–10.5)
Chloride: 105 mEq/L (ref 96–112)
Creatinine, Ser: 1.22 mg/dL (ref 0.40–1.50)
GFR: 56.27 mL/min — ABNORMAL LOW (ref >60.00)
Glucose, Bld: 105 mg/dL — ABNORMAL HIGH (ref 70–99)
Potassium: 3.7 mEq/L (ref 3.5–5.1)
Sodium: 138 mEq/L (ref 135–145)

## 2019-04-03 LAB — CBC WITH DIFFERENTIAL/PLATELET
Basophils Absolute: 0 10*3/uL (ref 0.0–0.1)
Basophils Relative: 0.3 % (ref 0.0–3.0)
Eosinophils Absolute: 0.5 10*3/uL (ref 0.0–0.7)
Eosinophils Relative: 4.3 % (ref 0.0–5.0)
HCT: 38.8 % — ABNORMAL LOW (ref 39.0–52.0)
Hemoglobin: 13 g/dL (ref 13.0–17.0)
Lymphocytes Relative: 15.2 % (ref 12.0–46.0)
Lymphs Abs: 1.6 10*3/uL (ref 0.7–4.0)
MCHC: 33.5 g/dL (ref 30.0–36.0)
MCV: 94.5 fl (ref 78.0–100.0)
Monocytes Absolute: 1.1 10*3/uL — ABNORMAL HIGH (ref 0.1–1.0)
Monocytes Relative: 10.7 % (ref 3.0–12.0)
Neutro Abs: 7.4 10*3/uL (ref 1.4–7.7)
Neutrophils Relative %: 69.5 % (ref 43.0–77.0)
Platelets: 265 10*3/uL (ref 150.0–400.0)
RBC: 4.1 Mil/uL — ABNORMAL LOW (ref 4.22–5.81)
RDW: 14.3 % (ref 11.5–15.5)
WBC: 10.7 10*3/uL — ABNORMAL HIGH (ref 4.0–10.5)

## 2019-04-03 LAB — D-DIMER, QUANTITATIVE: D-Dimer, Quant: 0.44 mcg/mL FEU (ref <0.50)

## 2019-04-03 NOTE — Telephone Encounter (Signed)
Thank you for letting me know.  Thank you [name] for managing.  We can continue to monitor and check potassium levels at next office visit.  Triage,  Please contact the patient let him know that I agree with the stated plan of care.  No changes recommended.  [name]

## 2019-04-03 NOTE — Telephone Encounter (Signed)
Yes that is fine. His potassium was normal today at 3.7.

## 2019-04-03 NOTE — Telephone Encounter (Signed)
Ok, not urgent. ill review brian's note.

## 2019-04-03 NOTE — Telephone Encounter (Signed)
Called and spoke w/ pt wife, Enid Derry (on [place]) with [name]'s results and recommendations. Enid Derry verbalized understanding with an additional question:  Should Mr. Bacci also take 2 potassium capsules in conjunction with the 2 tabs of lasix over tomorrow and Friday? Enid Derry states [name] had him taking potassium with his lasix to prevent hypokalemia.   [name], please advise. Thank you.

## 2019-04-03 NOTE — Telephone Encounter (Signed)
Please let patient know his BNP was elevated. Advise that he takes 2 tabs lasix (40mg ) tomorrow and Friday and then resume normal dosing. [name] to follow up if needs repeat labs next week or visit.

## 2019-04-03 NOTE — Telephone Encounter (Signed)
Called and spoke w/ pt wife regarding [name]'s recommendations and potassium result. Pt wife verbalized understanding with no additional questions.   I am routing this message to [name] for follow-up in case he would like pt to repeat labs or have an appt scheduled.  [name], please advise when you are able.

## 2019-04-03 NOTE — Telephone Encounter (Signed)
Received an urgent call report from [facility] with results on patient's Pro BNP done on 04/03/2019.   Since [name] is no longer in the office, I am routing this to the provider of the day.  [name], please review the result/impression copied below:     Pro BNP - 317 pg/mL   Please advise, thank you.

## 2019-04-04 LAB — PRO B NATRIURETIC PEPTIDE: NT-Pro BNP: 317 pg/mL (ref 0–486)

## 2019-04-04 NOTE — Telephone Encounter (Signed)
Called and spoke with Enid Derry (patient's wife) and advised her the response received from Wyn Quaker, NP. Confirmed her husband's future appointment 05/03/19 and that based on how he is doing at that time the CT previously mentioned may be discussed. Nothing further needed at this time.

## 2019-04-04 NOTE — Progress Notes (Signed)
Rest of lab work is stable.  Agree with increasing Lasix temporarily to help with fluid overload.  Proceed forward with Augmentin as prescribed.  Keep follow-up with our office.  Contact our office if any symptoms worsen or fever start.  Wyn Quaker, FNP

## 2019-04-05 MED ORDER — ARFORMOTEROL TARTRATE 15 MCG/2ML IN NEBU: 15.0000 ug | INHALATION_SOLUTION | Freq: Two times a day (BID) | RESPIRATORY_TRACT | 0 refills | Status: DC

## 2019-04-05 NOTE — Addendum Note (Signed)
Addended by: Amado Coe on: 04/05/2019 12:06 PM   Modules accepted: Orders

## 2019-04-24 ENCOUNTER — Encounter: Payer: Self-pay | Admitting: Internal Medicine

## 2019-04-24 ENCOUNTER — Other Ambulatory Visit: Payer: Self-pay

## 2019-04-24 ENCOUNTER — Ambulatory Visit: Payer: Medicare Other | Admitting: Internal Medicine

## 2019-04-24 VITALS — BP 116/72 | HR 75 | Temp 98.7°F | Ht 66.0 in | Wt 217.2 lb

## 2019-04-24 DIAGNOSIS — J439 Emphysema, unspecified: Secondary | ICD-10-CM

## 2019-04-24 DIAGNOSIS — J8410 Pulmonary fibrosis, unspecified: Secondary | ICD-10-CM

## 2019-04-24 MED ORDER — SODIUM CHLORIDE 3 % IN NEBU: INHALATION_SOLUTION | RESPIRATORY_TRACT | 12 refills | Status: AC

## 2019-04-24 NOTE — Addendum Note (Signed)
Addended by: Nena Polio on: 04/24/2019 10:46 AM   Modules accepted: Orders

## 2019-04-24 NOTE — Patient Instructions (Addendum)
ICD-10-CM   1. Pulmonary emphysema with fibrosis of lung ([place])  J43.9    J84.10     Focus is quality of life  Plan - continue night o2 as before  continue brovana and yupelri as scheduled with albuterol as needed - start 3% saline neb - 37mL daily once in the morning - high dose flu shot with pcp [name], MD  - Please talk to PCP [name], MD -  and ensure you get  shingrix (GSK) inactivated vaccine against shingles   Followup - 6-12 months or sooner if needed
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
## 2019-05-03 ENCOUNTER — Ambulatory Visit: Payer: Medicare Other | Admitting: Pulmonary Disease

## 2019-05-03 ENCOUNTER — Telehealth: Payer: Self-pay | Admitting: Internal Medicine

## 2019-05-03 NOTE — Telephone Encounter (Signed)
Rec'd call back from daughter Phoebe Perch and we have scheduled the Telephone Palliative Consult for 05/09/19 @ 11 AM.

## 2019-05-03 NOTE — Telephone Encounter (Signed)
Spoke with wife Lee [name] regarding Palliative services and after answered all questions, she was in agreement with this.  She requested that I call her daughter Lee [name] to schedule the Consult so that she could be available to be there as well.  Called daughter to schedule visit, no answer and I was unable to leave a message due to mailbox was full.  I then called wife back to let her know that I was unable to reach [place] and had to leave message and I requested that she contact Lee [name] and ask her to call me back so that we can schedule the Consult.

## 2019-05-09 ENCOUNTER — Other Ambulatory Visit: Payer: Self-pay

## 2019-05-09 ENCOUNTER — Other Ambulatory Visit: Payer: Medicare Other | Admitting: Internal Medicine

## 2019-05-09 ENCOUNTER — Encounter: Payer: Self-pay | Admitting: Internal Medicine

## 2019-05-09 DIAGNOSIS — Z515 Encounter for palliative care: Secondary | ICD-10-CM

## 2019-05-09 NOTE — Progress Notes (Signed)
Sept 17th, 2020 [facility] Consult Note Telephone: [phone]  Fax: [phone]  Due to the current COVID-19 infection/crises, the family prefer, and have given their verbal consent for, a provider visit via telemedicine. HIPPA policies of confidentially were discussed. Video-audio (telehealth) contact was unable to be done due technical barriers from the patient's side.  PATIENT NAME: Lee [name] DOB: 1930/12/11 MRN: [id number] [address], [place] [address] [phone], [phone], [place] [address] [phone]  PRIMARY CARE PROVIDER:   [name], MD ([place])  REFERRING PROVIDER:  [name], MD [facility]) [place],  [place] [address] [phone]  RESPONSIBLE PARTY:   **Note:  Wife prefers visits after [place] (wife) [phone] - home, [phone] - cell. Phoebe Perch [facility]) [phone]) cell [phone], (son) Ron Haab [facility]) [phone].   ASSESSMENT / RECOMMENDATIONS:  1. Advance Care Planning: A. Directives: Has a Living Will. We discussed DNR/sections of the MOST form. Family wish to discuss this amongst themselves. Enid Derry mentions that in past discussions with patient, he expressed he wouldn't want to be on a ventilator for a prolonged period of time. We discussed the low probability (given age and current health status) of patient surviving  a CPR, and if he did, the low probability of a hopeful outcome B. Goals of Care: To enjoy the best quality of life as possible. To minimize suffering. Patient enjoys visits from his  grandchildren and neighbors. Enjoys watching [place], watching the squirrels and birds.    2. Symptom Management: Dyspnea stable; management by Dr. Chase Caller   3.Cognitive / Functional status: Memory loss. Conversational ability waxes and wanes. Will respond to  questions but often be off topic with difficulty with word finding. Usually can follow simple commands. Speech pattern variable. If under pressure more difficulty. Sometimes speech clear and sometimes garbled.  Dependent for transfers with 1 (heavy)-2 person assist (changed over the last 6 months) and ambulates with unsteady gait/walker. Needs guiding assist. Unable to cognate use of brakes on rollator walker. Able to walk down a step or two with heavy assist. Occasionally will slide down out of travel chair. He has an Development worker, community chair which is helpful. Last fall about a month ago 2/2 loss of balance. Dependent for personal hygiene and dressing. Able to let family know when needs to use the bathroom. Occasional nocturnal incontinence. Sleeps in recliner d/t breathing issues; unable to lay flat in bed.  Past swallowing test okay. Cough productive of swallowed sputum. Respiratory management with O2 (nocturnal), Brovana, Yupelri, prn albuterol, 3% saline neb q am. Skin is thin and easily torn; easy bruising d/t on 2 blood thinners. No pressure injuries. Able to feed self with utensils; good oral consumption 100% of meals. Gets choke up easily so spouse cuts food up well. Height is 5'6". Weight 217lbs for a BMI of 35 kg/mw.  -May need a wheelchair ramp in the future. Discussed resourcing portable wheelchair ramp via [employer].  4. Family Supports: Patient lives with wife (of 59 yrs) Enid Derry. Has private duty aide service qd 8am-4pm, 4pm-10pm, and Wed,Thurs Fri night time coverage as well. 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 Enid Derry reports caregiving chore for her husband can be stressful, especially if hired caregivers miss their shift. Enid Derry has to provide 24/7 eyes on/hands on care due to patient's poor safety awareness.    -Discussed  anticipated progressive decline of both respiratory disease and cognitive function. Patient's wife and daughter do recognize this. We did discuss  hospice if patient were to continue further cognitive/respiratory decline so family would be familiar with these service for future reference.   5. Follow up Palliative Care Visit: f/u Thurs Oct 29 at noon with NP Gonzella Lex. Plan to complete MOST form if family desires. I have mailed to the home a copy of this form as well as companion Neurosurgeon, for family review.  I spent 60 minutes providing this consultation from 11am to noon. More than 50% of the time in this consultation was spent coordinating communication.   HISTORY OF PRESENT ILLNESS:  EDRIK STOOPS is a 85 y.o.male with h/o pulmonary emphysema / fibrosis, memory loss,  Allergic rhinitis, Anxiety, Basal cell carcinoma of face, Cardiomyopathy (04/2015), Cholecystitis, Chronic respiratory failure, Chronic systolic CHF COPD), endovascular stent graft for abdominal aortic aneurysm (2002), hiatal hernia, HTN, Hyperlipidemia, Interstitial lung disease, left bundle branch block, Obesity, On home oxygen therapy, PVD, Shingles, and Stroke. Palliative Care was asked to help address goals of care.   CODE STATUS: Full code; family reports has a Living Will in place  PPS: 30% HOSPICE ELIGIBILITY/DIAGNOSIS: TBD  PAST MEDICAL HISTORY:  Past Medical History:  Diagnosis Date  . Allergic rhinitis   . Anxiety   . Basal cell carcinoma of face   . Cardiomyopathy ([place]) 04/2015   EF 40-45% by echo. Apical anterior akinesis seen on echo, not confirmed on Myoview was negative for ischemia. Myoview suggested a small basal inferoseptal defect.  . Cholecystitis    a. complex admit 04/2015 due to sepsis due to acute Escherichia coli cholecystitis and right lower lobe CAP and Citrobacter UTI.  [name] Chronic respiratory failure ([place])    a. on home O2 after discharge 04/2015.  [name] Chronic systolic CHF (congestive heart failure) ([place])    a. Dx 04/2015 during complex admission for cholecystitis - EF 40-45% +WMA.  [name] COPD (chronic obstructive pulmonary  disease) ([place])   . Erectile dysfunction   . History of endovascular stent graft for abdominal aortic aneurysm 2002   a. s/p repair  Followed by Dr. Donnetta Hutching (stable evaluation 01/2106)  . History of hiatal hernia   . HTN (hypertension)   . Hyperlipidemia   . Interstitial lung disease ([place])    a. followed by pulm.  [name] LBBB (left bundle branch block)    a. Intermittent - seen in 2013, not in 09/2013, but again in 04/2015.  . Obesity   . On home oxygen therapy    "just at night" (10/05/2015)  . PVD (peripheral vascular disease) ([place])   . Shingles   . Stroke [facility])    a. H/o aphasia - was told he'd had ministrokes. Imaging 09/2014 revealed prior infarct.    SOCIAL HX:  Social History   Tobacco Use  . Smoking status: Former Smoker    Packs/day: 1.00    Years: 25.00    Pack years: 25.00    Types: Cigarettes    Start date: 01/11/1951    Quit date: 08/22/1974    Years since quitting: 44.7  . Smokeless tobacco: Never Used  Substance Use Topics  . Alcohol use: No    ALLERGIES:  Allergies  Allergen Reactions  . Ciprofloxacin Other (See Comments)    Tingle feeling throughout body  . Tape Other (See Comments)    Redness, Please use "paper" tape     PERTINENT MEDICATIONS:  Outpatient Encounter Medications as  of 05/09/2019  Medication Sig  . albuterol (PROVENTIL HFA;VENTOLIN HFA) 108 (90 Base) MCG/ACT inhaler Inhale 2 puffs into the lungs every 6 (six) hours as needed for wheezing or shortness of breath. (Patient not taking: Reported on 04/24/2019)  . albuterol (PROVENTIL) (2.5 MG/3ML) 0.083% nebulizer solution Take 3 mLs (2.5 mg total) by nebulization every 6 (six) hours as needed for wheezing or shortness of breath. (Patient taking differently: Take 2.5 mg by nebulization 2 (two) times daily. )  . amoxicillin-clavulanate (AUGMENTIN) 875-125 MG tablet Take 1 tablet by mouth 2 (two) times daily.  [name] apixaban (ELIQUIS) 5 MG TABS tablet Take 1 tablet (5 mg total) by mouth 2 (two) times daily.  (Patient taking differently: Take 5 mg by mouth 2 (two) times daily with a meal. )  . arformoterol (BROVANA) 15 MCG/2ML NEBU Take 2 mLs (15 mcg total) by nebulization 2 (two) times daily.  . carvedilol (COREG) 12.5 MG tablet TAKE 1 TABLET (12.5 MG TOTAL) BY MOUTH 2 (TWO) TIMES DAILY WITH A MEAL. (Patient taking differently: Take 12.5 mg by mouth 2 (two) times daily with a meal. )  . cholecalciferol (VITAMIN D) 1000 units tablet Take 1 tablet (1,000 Units total) by mouth daily. (Patient taking differently: Take 1,000 Units by mouth daily with supper. )  . clopidogrel (PLAVIX) 75 MG tablet Take 1 tablet (75 mg total) by mouth daily. (Patient taking differently: Take 75 mg by mouth daily at 3 pm. )  . ezetimibe (ZETIA) 10 MG tablet Take 1 tablet (10 mg total) by mouth daily.  . feeding supplement, ENSURE ENLIVE, (ENSURE ENLIVE) LIQD Take 237 mLs by mouth 2 (two) times daily between meals. (Patient taking differently: Take 237 mLs by mouth daily at 3 pm. )  . furosemide (LASIX) 20 MG tablet Take 1 tablet (20 mg total) by mouth every Monday, Wednesday, and Friday.  . Guaifenesin (MUCINEX MAXIMUM STRENGTH) 1200 MG TB12 Take 1,200 mg by mouth 2 (two) times daily.  [name] losartan (COZAAR) 50 MG tablet Take 1 tablet (50 mg total) by mouth daily.  . Multiple Vitamin (MULTIVITAMIN WITH MINERALS) TABS tablet Take 1 tablet by mouth daily.  . nitroGLYCERIN (NITROSTAT) 0.4 MG SL tablet Place 1 tablet (0.4 mg total) under the tongue every 5 (five) minutes as needed for chest pain.  . pantoprazole (PROTONIX) 40 MG tablet Take 1 tablet (40 mg total) by mouth daily. (Patient taking differently: Take 40 mg by mouth daily before breakfast. )  . Pitavastatin Calcium (LIVALO) 2 MG TABS Take 1 tablet (2 mg total) by mouth See admin instructions. Every 3rd day (Patient taking differently: Take 1 mg by mouth every 3 (three) days. )  . potassium chloride (MICRO-K) 10 MEQ CR capsule Take 1 capsule (10 mEq total) by mouth every  Monday, Wednesday, and Friday. **Take with lasix**  . Probiotic Product ([place]) CAPS Take 1 capsule by mouth daily.  . Revefenacin (YUPELRI) 175 MCG/3ML SOLN Inhale 1 vial into the lungs daily.  . sodium chloride HYPERTONIC 3 % nebulizer solution Once daily in the morning  . YUPELRI 175 MCG/3ML SOLN USE 1 VIAL IN NEBULIZER DAILY   No facility-administered encounter medications on file as of 05/09/2019.     PHYSICAL EXAM:   PE deferred d/t telehealth/telephonic only nature of visitl  Julianne Handler, NP

## 2019-06-07 ENCOUNTER — Telehealth: Payer: Self-pay | Admitting: Internal Medicine

## 2019-06-07 MED ORDER — PREDNISONE 10 MG PO TABS: ORAL_TABLET | ORAL | 0 refills | Status: DC

## 2019-06-07 NOTE — Telephone Encounter (Signed)
"  [name]" was not listed on patient's DPR so I called the patient's daughter Lee [name]. She stated that it would be ok to talk to [name]. Advised her that I wanted to double check, she verbalized understanding. She said that it would be best to call them at the [phone] number.    Spoke with Lee [name], patient's wife. She stated that on 10/14, patient had his scheduled dose of Yupelri at 2pm. Afterward his treatment, he began to cough more than usual and had labored breathing. This lasted for about 2 hours and then the symptoms went away. On 10/15, he had his scheduled dose at 2pm again and the symptoms returned. Increased cough with thick, clear phlegm. Denied any fevers. Per Lee [name], he does have some left arm pain but she stated this could have from having to help him get around the house. He has been on Yupelri since May 2020 and this is the first time this has happened.   [name] to skip the 2pm dose today until she hears back from our office. She verbalized understanding.   [name], can you please advise since I'm not sure if MR is available this afternoon. Thank you!

## 2019-06-07 NOTE — Telephone Encounter (Signed)
I do not think it is the Yupelri causing acute symptoms since he has been on it since May without issue. Likely related to acute COPD exacerbation or URI symptoms. I would make sure he is taking mucinex twice a day. And I will call in prednisone taper. If he develops fever or colored mucus let us know. If breathing does not improve and symptoms persists needs video visit.

## 2019-06-07 NOTE — Telephone Encounter (Signed)
Spoke with Ander Purpura. She is aware of [name]'s response. Prednisone taper has already been sent in. Nothing further was needed at this time.

## 2019-06-28 ENCOUNTER — Telehealth: Payer: Self-pay | Admitting: Internal Medicine

## 2019-06-28 NOTE — Telephone Encounter (Signed)
I'd recommend they contact cardiology or PCP. I do not think MR is managing his diuretic. Recommend mucinex twice daily for cough. O2 sats are fine.

## 2019-06-28 NOTE — Telephone Encounter (Signed)
lmtcb for [name] to [employer].

## 2019-06-28 NOTE — Telephone Encounter (Signed)
He can take double strength mucinex 1,200mg  twice daily (take with a glass of water) We can add a flutter valve if he doesn't have one Use short acting bronchodilator every 4-6 hours to help open up airway Stay well hydrated

## 2019-06-28 NOTE — Telephone Encounter (Signed)
Spoke with home health nurse [name], states that pt has had increased leg swelling X3 days, noted 3+ pitting edema.  Pt is prescribed Lasix 20mg  on MWF, was given an additional Lasix yesterday with no difference to edema.   Fluid in LLL per nurse's auscultation.  Pt also c/o prod cough with yellow/clear mucus.   Vitals:  Temp: 98.1, BP 137/70 pulse: 72: RR: 19 O2 levels fluxuating 92%-95%.    [name] is requesting additional recs- unsure of next steps.  Per nurse, would prefer not to come in to office d/t difficulty ambulating patient.    Sending to APP as MR is in hospital today.  [name] please advise on additional recs.  Thanks!

## 2019-06-28 NOTE — Telephone Encounter (Signed)
Called and spoke with Lee [name] letting her know the info stated by [facility]. Per Lee [name], pt is already doing 1200mg  mucinex bid. Pt will not be able to do the flutter valve. [name] stated for pt to continue taking all meds as currently prescribed and currently doing and Lee [name] verbalized understanding. Stated to her that Lee [name] said to contact PCP in regards to lasix and she said they have a call out to them as well so the will await a return call from them. Nothing further needed.

## 2019-06-28 NOTE — Telephone Encounter (Signed)
Spoke with Amy. She was made aware of [name]'s recommendations. She stated that she will contact patient's PCP and cardiologist in regards to his diuretic.   She also stated that the patient is currently taking regular Mucinex twice daily. His cough is so strong at times that he will start to gag. She wanted to know if there was anything he could do for the cough. He has tried Delsym before in the past but it did not help.   [name], please advise. Thanks!

## 2019-07-26 ENCOUNTER — Telehealth: Payer: Self-pay | Admitting: Cardiology

## 2019-07-26 NOTE — Telephone Encounter (Signed)
Totally agree

## 2019-07-26 NOTE — Telephone Encounter (Signed)
Spoke with wife - aware she may attend appt ,but aide will have sit in the hallway .  she verbalized understanding.

## 2019-07-26 NOTE — Telephone Encounter (Signed)
New Message:     Wife called and said she and the pt's Aide will need to come in with pt for his  appt on Monday(07-29-19). Pt is unable to walk and is in a wheelchair. She said she can handle him in the wheelchair without his Aide.

## 2019-07-29 ENCOUNTER — Ambulatory Visit: Payer: Medicare Other | Admitting: Cardiology

## 2019-07-29 ENCOUNTER — Other Ambulatory Visit: Payer: Self-pay

## 2019-07-29 VITALS — BP 142/76 | HR 71 | Ht 66.0 in | Wt 226.0 lb

## 2019-07-29 DIAGNOSIS — R5381 Other malaise: Secondary | ICD-10-CM

## 2019-07-29 DIAGNOSIS — I10 Essential (primary) hypertension: Secondary | ICD-10-CM

## 2019-07-29 DIAGNOSIS — I5033 Acute on chronic diastolic (congestive) heart failure: Secondary | ICD-10-CM | POA: Diagnosis not present

## 2019-07-29 DIAGNOSIS — J84112 Idiopathic pulmonary fibrosis: Secondary | ICD-10-CM | POA: Diagnosis not present

## 2019-07-29 DIAGNOSIS — I2699 Other pulmonary embolism without acute cor pulmonale: Secondary | ICD-10-CM

## 2019-07-29 DIAGNOSIS — I119 Hypertensive heart disease without heart failure: Secondary | ICD-10-CM | POA: Diagnosis not present

## 2019-07-29 DIAGNOSIS — R0609 Other forms of dyspnea: Secondary | ICD-10-CM

## 2019-07-29 DIAGNOSIS — I428 Other cardiomyopathies: Secondary | ICD-10-CM

## 2019-07-29 DIAGNOSIS — R0600 Dyspnea, unspecified: Secondary | ICD-10-CM

## 2019-07-29 NOTE — Progress Notes (Signed)
Primary Care Provider: [name], MD Cardiologist: Glenetta Hew, MD Electrophysiologist:   Clinic Note: Chief Complaint  Patient presents with  . Follow-up    Short of breath and wheezing today.  Weight is up.  Has edema.  . Shortness of Breath    Worsening wheezing  . Edema    HPI:    Lee [name] is a 85 y.o. male with a PMH notable for ~ mild NICM - LBBB, AAA s/p repair (Dr. Donnetta Hutching) & O2 dependent COPD/pulmonary fibrosis, CVA & HTN/HLD, who presents today for 6 month f/u - & notable recent PE (Oc 2019)   Lee [name] was last seen in June 2020 - Telemedicine: Overall he is pretty much limited activity walking with a rolling walker for short trips from the house to the car or around the house, anything further use of wheelchair.  He is still on O2 would definitely use it at night.  He has a little orthopnea he first lays down, but not to get the oxygen off he does fine. Edema stable with lasix 3d/wk.   Recent Hospitalizations: No recent hospital stays  Reviewed  CV studies:    The following studies were reviewed today: (if available, images/films reviewed: From Epic Chart or Care Everywhere) . 2D Echocardiogram June 16, 2018: (Order for PE) poor windows.  LVEF appeared normal with normal cavity size maybe mild LVH.  Likely normal aortic and mitral valves.  No significant stenosis or regurgitation   Interval History:   Lee [name] is here for routine follow-up seemingly a little worse for wear.  Notably, forced himself is not the greatest historians.  Most history is provided by the wife.  He answers some questions but not always, and often does so with unintelligible speech-he often will mumble " ya ha - no".  He is audibly wheezing with mild forced expiratory breathing.  Apparently he has recently had "bilateral pneumonia and was treated with antibiotics and steroids etc.  He has been wheezing a lot more ever since that timeframe.  However, his wife also recalls  that his weight is gone up and is having some more swelling.  He routinely takes his Lasix 3 days a week only, when he does take Lasix the swelling is better.  Very limited activity, especially since his recent pneumonia.  Fairly active.  Currently in a wheelchair.  His wife indicates that he may have a little bit more orthopnea and PND type symptoms that she can tell.  He has been sleeping up in the chair.  No complaints of chest pain or pressure with rest or exertion.  Nothing to suggest any arrhythmia.  No syncope/near syncope or TIA/amaurosis fugax.   CV Review of Symptoms (Summary)  positive for - dyspnea on exertion, edema, orthopnea, paroxysmal nocturnal dyspnea and Coughing and wheezing.  Afebrile negative for - loss of consciousness, paroxysmal nocturnal dyspnea, rapid heart rate, shortness of breath or No obvious murmur  The patient does not have symptoms concerning for COVID-19 infection (fever, chills, cough, or new shortness of breath).  The patient is practicing social distancing. ++  Masking, as for as long as he would keep it on.    REVIEWED OF SYSTEMS   A comprehensive ROS was performed, however questions asked to the wife.. Review of Systems  Unable to perform ROS: Mental acuity  Constitutional: Positive for malaise/fatigue. Negative for weight loss.  HENT: Positive for congestion.   Eyes: Negative for double vision and photophobia.  Respiratory: Positive  for cough, shortness of breath and wheezing. Negative for sputum production.   Cardiovascular: Positive for orthopnea and leg swelling.  Gastrointestinal: Negative for blood in stool and melena.  Genitourinary: Negative for hematuria.  Musculoskeletal: Positive for joint pain (Diffuse arthritis.  Unsteady gait.). Negative for back pain.       Bilateral leg pain  Neurological: Positive for dizziness (Positional) and weakness (Generalized).  Endo/Heme/Allergies: Bruises/bleeds easily.  Psychiatric/Behavioral: Positive  for memory loss. The patient is nervous/anxious and has insomnia.    I have reviewed and (if needed) personally updated the patient's problem list, medications, allergies, past medical and surgical history, social and family history.   PAST MEDICAL HISTORY   Past Medical History:  Diagnosis Date  . Allergic rhinitis   . Anxiety   . Basal cell carcinoma of face   . Cardiomyopathy ([place]) 04/2015   EF 40-45% by echo. Apical anterior akinesis seen on echo, not confirmed on Myoview was negative for ischemia. Myoview suggested a small basal inferoseptal defect.  . Cholecystitis    a. complex admit 04/2015 due to sepsis due to acute Escherichia coli cholecystitis and right lower lobe CAP and Citrobacter UTI.  [name] Chronic respiratory failure ([place])    a. on home O2 after discharge 04/2015.  [name] Chronic systolic CHF (congestive heart failure) ([place])    a. Dx 04/2015 during complex admission for cholecystitis - EF 40-45% +WMA.  [name] COPD (chronic obstructive pulmonary disease) ([place])   . Erectile dysfunction   . History of endovascular stent graft for abdominal aortic aneurysm 2002   a. s/p repair  Followed by Dr. Donnetta Hutching (stable evaluation 01/2106)  . History of hiatal hernia   . HTN (hypertension)   . Hyperlipidemia   . Interstitial lung disease ([place])    a. followed by pulm.  [name] LBBB (left bundle branch block)    a. Intermittent - seen in 2013, not in 09/2013, but again in 04/2015.  . Obesity   . On home oxygen therapy    "just at night" (10/05/2015)  . PVD (peripheral vascular disease) ([place])   . Shingles   . Stroke [facility])    a. H/o aphasia - was told he'd had ministrokes. Imaging 09/2014 revealed prior infarct.     PAST SURGICAL HISTORY   Past Surgical History:  Procedure Laterality Date  . AORTA SURGERY  2002  . CHOLECYSTECTOMY N/A 10/05/2015   Procedure: LAPAROSCOPIC CHOLECYSTECTOMY  ;  Surgeon: Autumn Messing III, MD;  Location: [place];  Service: General;  Laterality: N/A;  . [place]  .  INTRAOPERATIVE CHOLANGIOGRAM  10/05/2015   Procedure: INTRAOPERATIVE CHOLANGIOGRAM;  Surgeon: Autumn Messing III, MD;  Location: [place];  Service: General;;  . LAPAROSCOPIC CHOLECYSTECTOMY  10/05/2015  . NM MYOVIEW LTD  05/2015   EF 49%. Small size, mild severity perfusion defect in the basal-mid inferoseptal wall. LOW RISK. No ischemia. Defect thought to be related to LBBB artifacts and not true lesion. This could also explain mild systolic dysfunction.  . TRANSTHORACIC ECHOCARDIOGRAM  04/2015   (In setting of acute illness).EF 40-45% with mid-apical anteroseptal akinesis. Moderately increased PA pressures of roughly 48 mmHg.  [name] URETHRAL STRICTURE DILATATION  late 1970s   urinary tract stretch    MEDICATIONS/ALLERGIES   Current Meds  Medication Sig  . albuterol (PROVENTIL HFA;VENTOLIN HFA) 108 (90 Base) MCG/ACT inhaler Inhale 2 puffs into the lungs every 6 (six) hours as needed for wheezing or shortness of breath.  [name] albuterol (PROVENTIL) (2.5 MG/3ML) 0.083% nebulizer  solution Take 3 mLs (2.5 mg total) by nebulization every 6 (six) hours as needed for wheezing or shortness of breath. (Patient taking differently: Take 2.5 mg by nebulization 2 (two) times daily. )  . apixaban (ELIQUIS) 5 MG TABS tablet Take 1 tablet (5 mg total) by mouth 2 (two) times daily. (Patient taking differently: Take 5 mg by mouth 2 (two) times daily with a meal. )  . arformoterol (BROVANA) 15 MCG/2ML NEBU Take 2 mLs (15 mcg total) by nebulization 2 (two) times daily.  . carvedilol (COREG) 12.5 MG tablet TAKE 1 TABLET (12.5 MG TOTAL) BY MOUTH 2 (TWO) TIMES DAILY WITH A MEAL. (Patient taking differently: Take 12.5 mg by mouth 2 (two) times daily with a meal. )  . cholecalciferol (VITAMIN D) 1000 units tablet Take 1 tablet (1,000 Units total) by mouth daily. (Patient taking differently: Take 1,000 Units by mouth daily with supper. )  . clopidogrel (PLAVIX) 75 MG tablet Take 1 tablet (75 mg total) by mouth daily. (Patient taking  differently: Take 75 mg by mouth daily at 3 pm. )  . ezetimibe (ZETIA) 10 MG tablet Take 1 tablet (10 mg total) by mouth daily.  . feeding supplement, ENSURE ENLIVE, (ENSURE ENLIVE) LIQD Take 237 mLs by mouth 2 (two) times daily between meals. (Patient taking differently: Take 237 mLs by mouth daily at 3 pm. )  . furosemide (LASIX) 20 MG tablet Take 1 tablet (20 mg total) by mouth every Monday, Wednesday, and Friday.  . losartan (COZAAR) 50 MG tablet Take 1 tablet (50 mg total) by mouth daily.  . Multiple Vitamin (MULTIVITAMIN WITH MINERALS) TABS tablet Take 1 tablet by mouth daily.  . nitroGLYCERIN (NITROSTAT) 0.4 MG SL tablet Place 1 tablet (0.4 mg total) under the tongue every 5 (five) minutes as needed for chest pain.  . pantoprazole (PROTONIX) 40 MG tablet Take 1 tablet (40 mg total) by mouth daily. (Patient taking differently: Take 40 mg by mouth daily before breakfast. )  . Pitavastatin Calcium (LIVALO) 2 MG TABS Take 1 tablet (2 mg total) by mouth See admin instructions. Every 3rd day (Patient taking differently: Take 1 mg by mouth every 3 (three) days. )  . potassium chloride (MICRO-K) 10 MEQ CR capsule Take 1 capsule (10 mEq total) by mouth every Monday, Wednesday, and Friday. **Take with lasix**  . Probiotic Product ([place]) CAPS Take 1 capsule by mouth daily.  . Revefenacin (YUPELRI) 175 MCG/3ML SOLN Inhale 1 vial into the lungs daily.  . sodium chloride HYPERTONIC 3 % nebulizer solution Once daily in the morning  . YUPELRI 175 MCG/3ML SOLN USE 1 VIAL IN NEBULIZER DAILY    Allergies  Allergen Reactions  . Ciprofloxacin Other (See Comments)    Tingle feeling throughout body  . Tape Other (See Comments)    Redness, Please use "paper" tape     SOCIAL HISTORY/FAMILY HISTORY   Social History   Tobacco Use  . Smoking status: Former Smoker    Packs/day: 1.00    Years: 25.00    Pack years: 25.00    Types: Cigarettes    Start date: 01/11/1951    Quit date:  08/22/1974    Years since quitting: 44.9  . Smokeless tobacco: Never Used  Substance Use Topics  . Alcohol use: No  . Drug use: No   Social History   Social History Narrative  . Not on file    Family History family history includes Colon cancer in his brother; Heart disease in  his father; Hypertension in his mother; Stroke in his father.   OBJCTIVE -PE, EKG, labs   Wt Readings from Last 3 Encounters:  07/29/19 226 lb (102.5 kg)  04/24/19 217 lb 3.2 oz (98.5 kg)  04/02/19 214 lb (97.1 kg)    Physical Exam: BP (!) 142/76   Pulse 71   Ht 5\' 6"  (1.676 m)   Wt 226 lb (102.5 kg)   SpO2 97%   BMI 36.48 kg/m  Physical Exam  Constitutional: He appears well-developed and well-nourished. No distress (Normal for age).  Chronically ill elderly gentleman.  Sitting in a wheelchair.  Not very interactive and nonverbal.  HENT:  Head: Normocephalic and atraumatic.  Neck: Normal range of motion. Neck supple. JVD present. No hepatojugular reflux present. Carotid bruit is not present. No tracheal deviation present.  Cardiovascular: Normal rate, regular rhythm, S1 normal and S2 normal.  Occasional extrasystoles are present. PMI is not displaced. Exam reveals distant heart sounds (Muffled by expiratory wheezing.) and decreased pulses (Mildly decreased fetal pulses.). Exam reveals no gallop and no friction rub.  No murmur heard. Difficult to palpate pedal pulses due to edema.  Pulmonary/Chest: Effort normal. No respiratory distress. He has wheezes. He has no rales. He exhibits no tenderness.  Mildly increased work of breathing with accessory muscle use, but nonlabored.  With forced long expirations, there is stridorous wheeze, not with normal baseline respirations, however he will sometimes do that the breathing/wheezing without being asked on exam.  Abdominal: Soft. Bowel sounds are normal. He exhibits no distension. There is no abdominal tenderness. There is no rebound.  Musculoskeletal: Normal  range of motion.        General: No edema (2+ edema from knees down).  Neurological: He is alert.  Oriented to  today person and place.  Able to find a location on the mat, but not able to answer other detailed historical questions  Psychiatric: He has a normal mood and affect. His behavior is normal. Judgment and thought content normal.  Vitals reviewed.   Adult ECG Report  Rate: 71 ;  Rhythm: normal sinus rhythm and 1 degree AVB.  Nonspecific IVCD.  Left axis deviation (-52.  Cannot occlude inferior MI, age undetermined as well as anterior MI, age undetermined.;   Narrative Interpretation: Overall relatively stable EKG.  Recent Labs:    March 06, 2019: TC 147, TG 132, HDL 36, LDL 81.  A1c 5.3.  Hgb 13.  CR 0.9. Lab Results  Component Value Date   CREATININE 1.22 04/03/2019   BUN 15 04/03/2019   NA 138 04/03/2019   K 3.7 04/03/2019   CL 105 04/03/2019   CO2 26 04/03/2019    ASSESSMENT/PLAN    Problem List Items Addressed This Visit    Hypertensive heart disease without CHF (mild HFpEF) (Chronic)    Actually he does have some heart failure symptoms now with edema and orthopnea.  Mostly his dyspnea is related to COPD with various exacerbations, but I cannot exclude some CHF.   Currently on 3 days a week Lasix.  We will have him increase his Lasix interval for the next 2 weeks to treat the edema and symptoms.  Plan: Take extra dose of Lasix today, then daily until next Monday at which time she will continue taking daily through Friday of next week and then hold for the weekend next week and.  After that can return to every other day with additional).  If able to wear, will use support stockings. Blood pressure  is what we want to be for him.  He is on stable dose of carvedilol and losartan.  Continue to recommend support stockings.      Essential hypertension (Chronic)    Blood pressure actually is in the appropriate range today on current meds.  No change      Relevant  Orders   EKG 12-Lead (Completed)   H/o Nonischemic CM -- EF ~50% on f/u studies (~ LBBB related) (Chronic)    He has had orthostatic issues in the past, therefore I am reluctant to do much more than treat with current dose of carvedilol and losartan.  On stable moderate dose of furosemide which are increasing on an interval basis.      Pulmonary emboli (HCC) (Chronic)    History of PCI.  Remains on both Eliquis and Plavix.  Plan for PCP was to reduce Eliquis to 2.5 mg twice daily which makes sense.  Especially in conjunction with Plavix which is there for stroke.  His PE was over a year ago, so I would imagine that perhaps by the time he receives his PCP again, we could potentially consider stopping Eliquis if there is concern of possible bleeding issues.      Relevant Orders   EKG 12-Lead (Completed)   Dyspnea on exertion    If I guess, his dyspnea is more pulmonary than cardiac.  However now his weight is up and he has edema.  We will titrate up his Lasix and reassess. Otherwise stable meds.      IPF (idiopathic pulmonary fibrosis) (HCC)   Relevant Orders   EKG 12-Lead (Completed)   Physical deconditioning    His mobility makes it very difficult to assess symptoms and also physical exam.  Currently in a wheelchair with plans to go back to the couch.  He needs to continue to stay active as much as possible.  Likely needs to use walker and walk short distances.      Acute on chronic diastolic heart failure ([place]) - Primary    His weight is probably about 12 pounds up from baseline.  Usually at home he is closer to 215 pounds.  He is definitely noticing some orthopnea and worsening anemia. He is on stable dose of losartan and carvedilol, and reluctant to further increase. Plan: Increase dosage interval of Lasix to daily until the next Saturday and Sunday.  After that if weight is back down and edema is improved, would go back to every other day.  Notably however, if his breathing  does not improve despite improved edema, I probably needs to contact PCP about the assessment for possible viral pneumonia etc.      Relevant Orders   EKG 12-Lead (Completed)       COVID-19 Education: The signs and symptoms of COVID-19 were discussed with the patient and how to seek care for testing (follow up with PCP or arrange E-visit).   The importance of social distancing was discussed today.  I spent a total of 96minutes with the patient and chart review. >  50% of the time was spent in direct patient consultation.  Additional time spent with chart review (studies, outside notes, etc): 10 Total Time: 28 min   Current medicines are reviewed at length with the patient today.  (+/- concerns) --> Lasix dosing interval adjusted.    Patient Instructions / Medication Changes & Studies & Tests Ordered   Patient Instructions  Medication Instructions:   Today take an extra Lasix    Take  lasix every day  Until  Aug 11, 2019  starting Dec 20 ,2020 - Monday  Through Friday take  Lasix  Do not take on Saturday 12/26 or Sunday  12/27 Then return to  Monday -Wednesday - Friday  If swelling or breathing does not  Better contact primary or lung doctor. *If you need a refill on your cardiac medications before your next appointment, please call your pharmacy*  Lab Work: Not needed   If you have labs (blood work) drawn today and your tests are completely normal, you will receive your results only by: [name] MyChart Message (if you have MyChart) OR . A paper copy in the mail If you have any lab test that is abnormal or we need to change your treatment, we will call you to review the results.  Testing/Procedures:   Follow-Up: At [facility], you and your health needs are our priority.  As part of our continuing mission to provide you with exceptional heart care, we have created designated Provider Care Teams.  These Care Teams include your primary Cardiologist (physician) and Advanced  Practice Providers (APPs -  Physician Assistants and Nurse Practitioners) who all work together to provide you with the care you need, when you need it.  Your next appointment:   Dec 29 , 2020   The format for your next appointment:   In Person  Provider:   Glenetta Hew, MD  Other Instructions    Studies Ordered:   Orders Placed This Encounter  Procedures  . EKG 12-Lead     Glenetta Hew, M.D., M.S. Interventional Cardiologist   Pager # [phone] Phone # [phone] [address]. [place], [place] [address]   Thank you for choosing Heartcare at [facility]!!

## 2019-07-29 NOTE — Patient Instructions (Addendum)
Medication Instructions:   Today take an extra Lasix    Take lasix every day  Until  Aug 11, 2019  starting Dec 20 ,2020 - Monday  Through Friday take  Lasix  Do not take on Saturday 12/26 or Sunday  12/27 Then return to  Monday -Wednesday - Friday  If swelling or breathing does not  Better contact primary or lung doctor. *If you need a refill on your cardiac medications before your next appointment, please call your pharmacy*  Lab Work: Not needed   If you have labs (blood work) drawn today and your tests are completely normal, you will receive your results only by: [name] MyChart Message (if you have MyChart) OR . A paper copy in the mail If you have any lab test that is abnormal or we need to change your treatment, we will call you to review the results.  Testing/Procedures:   Follow-Up: At [facility], you and your health needs are our priority.  As part of our continuing mission to provide you with exceptional heart care, we have created designated Provider Care Teams.  These Care Teams include your primary Cardiologist (physician) and Advanced Practice Providers (APPs -  Physician Assistants and Nurse Practitioners) who all work together to provide you with the care you need, when you need it.  Your next appointment:   Dec 29 , 2020   The format for your next appointment:   In Person  Provider:   Glenetta Hew, MD  Other Instructions

## 2019-07-31 ENCOUNTER — Encounter: Payer: Self-pay | Admitting: Cardiology

## 2019-07-31 NOTE — Assessment & Plan Note (Signed)
If I guess, his dyspnea is more pulmonary than cardiac.  However now his weight is up and he has edema.  We will titrate up his Lasix and reassess. Otherwise stable meds.

## 2019-07-31 NOTE — Assessment & Plan Note (Addendum)
Actually he does have some heart failure symptoms now with edema and orthopnea.  Mostly his dyspnea is related to COPD with various exacerbations, but I cannot exclude some CHF.   Currently on 3 days a week Lasix.  We will have him increase his Lasix interval for the next 2 weeks to treat the edema and symptoms.  Plan: Take extra dose of Lasix today, then daily until next Monday at which time she will continue taking daily through Friday of next week and then hold for the weekend next week and.  After that can return to every other day with additional).  If able to wear, will use support stockings. Blood pressure is what we want to be for him.  He is on stable dose of carvedilol and losartan.  Continue to recommend support stockings.

## 2019-07-31 NOTE — Assessment & Plan Note (Addendum)
His weight is probably about 12 pounds up from baseline.  Usually at home he is closer to 215 pounds.  He is definitely noticing some orthopnea and worsening anemia. He is on stable dose of losartan and carvedilol, and reluctant to further increase. Plan: Increase dosage interval of Lasix to daily until the next Saturday and Sunday.  After that if weight is back down and edema is improved, would go back to every other day.  Notably however, if his breathing does not improve despite improved edema, I probably needs to contact PCP about the assessment for possible viral pneumonia etc.

## 2019-07-31 NOTE — Assessment & Plan Note (Signed)
He has had orthostatic issues in the past, therefore I am reluctant to do much more than treat with current dose of carvedilol and losartan.  On stable moderate dose of furosemide which are increasing on an interval basis.

## 2019-07-31 NOTE — Assessment & Plan Note (Signed)
Blood pressure actually is in the appropriate range today on current meds.  No change

## 2019-07-31 NOTE — Assessment & Plan Note (Signed)
His mobility makes it very difficult to assess symptoms and also physical exam.  Currently in a wheelchair with plans to go back to the couch.  He needs to continue to stay active as much as possible.  Likely needs to use walker and walk short distances.

## 2019-07-31 NOTE — Assessment & Plan Note (Signed)
History of PCI.  Remains on both Eliquis and Plavix.  Plan for PCP was to reduce Eliquis to 2.5 mg twice daily which makes sense.  Especially in conjunction with Plavix which is there for stroke.  His PE was over a year ago, so I would imagine that perhaps by the time he receives his PCP again, we could potentially consider stopping Eliquis if there is concern of possible bleeding issues.

## 2019-08-14 ENCOUNTER — Telehealth (INDEPENDENT_AMBULATORY_CARE_PROVIDER_SITE_OTHER): Payer: Medicare Other | Admitting: Adult Health

## 2019-08-14 ENCOUNTER — Encounter: Payer: Self-pay | Admitting: Adult Health

## 2019-08-14 DIAGNOSIS — J8410 Pulmonary fibrosis, unspecified: Secondary | ICD-10-CM

## 2019-08-14 DIAGNOSIS — Z8701 Personal history of pneumonia (recurrent): Secondary | ICD-10-CM

## 2019-08-14 DIAGNOSIS — J439 Emphysema, unspecified: Secondary | ICD-10-CM

## 2019-08-14 DIAGNOSIS — J849 Interstitial pulmonary disease, unspecified: Secondary | ICD-10-CM | POA: Diagnosis not present

## 2019-08-14 MED ORDER — AMOXICILLIN-POT CLAVULANATE 875-125 MG PO TABS: 1.0000 | ORAL_TABLET | Freq: Two times a day (BID) | ORAL | 0 refills | Status: AC

## 2019-08-14 MED ORDER — PREDNISONE 20 MG PO TABS: 20.0000 mg | ORAL_TABLET | Freq: Every day | ORAL | 0 refills | Status: DC

## 2019-08-14 NOTE — Patient Instructions (Addendum)
Augmentin 875mg  Twice daily  For 1 week , take with food Eat yogurt daily .  Continue on Mucinex Continue on Brovana, Yupelri and Hypertonic nebs  Continue on oxygen at bedtime Follow-up in 2 to 3 weeks Dr. Chase Caller with chest x-ray and as needed Please contact office for sooner follow up if symptoms do not improve or worsen or seek emergency care

## 2019-08-14 NOTE — Addendum Note (Signed)
Addended by: Jannette Spanner on: 08/14/2019 11:49 AM   Modules accepted: Orders

## 2019-08-14 NOTE — Telephone Encounter (Signed)
Can you forward to [place], he and MR have been seeing him

## 2019-08-14 NOTE — Telephone Encounter (Signed)
Schedule with APP or MR for virtual visit today.   Wyn Quaker FNP

## 2019-08-14 NOTE — Progress Notes (Signed)
Virtual Visit via Video Note  I connected with Lee [name] on 08/14/19 at 10:30 AM EST by a video enabled telemedicine application and verified that I am speaking with the correct person using two identifiers.  Location: Patient: Home  Provider: Office    I discussed the limitations of evaluation and management by telemedicine and the availability of in person appointments. The patient expressed understanding and agreed to proceed.  History of Present Illness: 85 yo male former smoker followed for Emphysema with ILD  Today's video visit is for an acute office visit for COPD and emphysema.  Patient has underlying dementia.  He is accompanied by his wife and caregiver.  Says over the last 2 days patient has had increased wheezing and cough is more congested with thick yellow mucus.  Patient is on aggressive pulmonary regimen with Brovana  neb Twice daily  And Yupelri Neb daily  And Hypertonic Neb daily .  Per patient's wife patient is appetite is good no nausea vomiting diarrhea.  No fever.  No increased confusion or agitation.  O2 saturations have been good on room air at 96%.  He does use oxygen at bedtime.  He had no fever today.  Heart rate was within his normal limits.  He has had no known sick contacts.  And no family members are sick at home.  Patient is followed by cardiology for his cardiomyopathy.  Has had some increased lower extremity swelling.  His Lasix was increased.  Wife says that this is helping.    Patient was diagnosed with pneumonia 6 to 8 weeks ago.  He was treated with a Z-Pak and cefdinir.  Reports his symptoms did improve.  Has not had a follow-up chest x-ray.   Patient Active Problem List   Diagnosis Date Noted  . Acute on chronic diastolic heart failure ([place]) 07/29/2019  . Coughing up blood 01/11/2019  . Physical deconditioning 08/14/2018  . COPD exacerbation ([place]) 08/13/2018  . History of recent pneumonia 07/10/2018  . Pulmonary emboli ([place]) 06/15/2018  .  Aspiration pneumonia ([place]) 06/15/2018  . Altered mental status 06/15/2018  . Chest pain 05/30/2017  . Stroke ([place])   . UTI (urinary tract infection) 05/02/2015  . H/o Nonischemic CM -- EF ~50% on f/u studies (~ LBBB related) 05/02/2015  . Sepsis ([place]) 04/30/2015  . Essential hypertension 04/30/2015  . Abdominal pain 04/30/2015  . Hyperlipidemia with target LDL less than 100 04/30/2015  . CAP (community acquired pneumonia)   . IPF (idiopathic pulmonary fibrosis) ([place]) 10/22/2014  . Cough 10/22/2014  . GERD (gastroesophageal reflux disease) 02/05/2013  . Other emphysema ([place]) 10/23/2012  . Idiopathic pulmonary fibrosis, high clinical pretest probability, declined surgical lung biopsy 09/13/2012  . Dyspnea on exertion 08/03/2012  . Hypercholesterolemia 08/03/2012  . Hypertensive heart disease without CHF (mild HFpEF) 08/03/2012  . H/O abdominal aortic aneurysm repair 08/03/2012  . Ventral hernia 08/03/2012   Current Outpatient Medications on File Prior to Visit  Medication Sig Dispense Refill  . albuterol (PROVENTIL HFA;VENTOLIN HFA) 108 (90 Base) MCG/ACT inhaler Inhale 2 puffs into the lungs every 6 (six) hours as needed for wheezing or shortness of breath. 1 Inhaler 0  . albuterol (PROVENTIL) (2.5 MG/3ML) 0.083% nebulizer solution Take 3 mLs (2.5 mg total) by nebulization every 6 (six) hours as needed for wheezing or shortness of breath. (Patient taking differently: Take 2.5 mg by nebulization 2 (two) times daily. ) 360 mL 12  . apixaban (ELIQUIS) 5 MG TABS tablet Take 1 tablet (5  mg total) by mouth 2 (two) times daily. (Patient taking differently: Take 5 mg by mouth 2 (two) times daily with a meal. ) 60 tablet 0  . arformoterol (BROVANA) 15 MCG/2ML NEBU Take 2 mLs (15 mcg total) by nebulization 2 (two) times daily. 120 mL 11  . carvedilol (COREG) 12.5 MG tablet TAKE 1 TABLET (12.5 MG TOTAL) BY MOUTH 2 (TWO) TIMES DAILY WITH A MEAL. (Patient taking differently: Take 12.5 mg by mouth 2  (two) times daily with a meal. ) 180 tablet 3  . cholecalciferol (VITAMIN D) 1000 units tablet Take 1 tablet (1,000 Units total) by mouth daily. (Patient taking differently: Take 1,000 Units by mouth daily with supper. ) 30 tablet 0  . clopidogrel (PLAVIX) 75 MG tablet Take 1 tablet (75 mg total) by mouth daily. (Patient taking differently: Take 75 mg by mouth daily at 3 pm. ) 30 tablet 5  . ezetimibe (ZETIA) 10 MG tablet Take 1 tablet (10 mg total) by mouth daily. 30 tablet 0  . feeding supplement, ENSURE ENLIVE, (ENSURE ENLIVE) LIQD Take 237 mLs by mouth 2 (two) times daily between meals. (Patient taking differently: Take 237 mLs by mouth daily at 3 pm. ) 237 mL 12  . furosemide (LASIX) 20 MG tablet Take 1 tablet (20 mg total) by mouth every Monday, Wednesday, and Friday. 30 tablet 6  . losartan (COZAAR) 50 MG tablet Take 1 tablet (50 mg total) by mouth daily. 30 tablet 0  . Multiple Vitamin (MULTIVITAMIN WITH MINERALS) TABS tablet Take 1 tablet by mouth daily. 30 tablet 0  . nitroGLYCERIN (NITROSTAT) 0.4 MG SL tablet Place 1 tablet (0.4 mg total) under the tongue every 5 (five) minutes as needed for chest pain. 30 tablet 0  . pantoprazole (PROTONIX) 40 MG tablet Take 1 tablet (40 mg total) by mouth daily. (Patient taking differently: Take 40 mg by mouth daily before breakfast. ) 30 tablet 10  . Pitavastatin Calcium (LIVALO) 2 MG TABS Take 1 tablet (2 mg total) by mouth See admin instructions. Every 3rd day (Patient taking differently: Take 1 mg by mouth every 3 (three) days. ) 30 tablet 0  . potassium chloride (MICRO-K) 10 MEQ CR capsule Take 1 capsule (10 mEq total) by mouth every Monday, Wednesday, and Friday. **Take with lasix** 14 capsule 5  . Probiotic Product ([place]) CAPS Take 1 capsule by mouth daily. 30 capsule 0  . Revefenacin (YUPELRI) 175 MCG/3ML SOLN Inhale 1 vial into the lungs daily. 5 vial 0  . sodium chloride HYPERTONIC 3 % nebulizer solution Once daily in the morning  750 mL 12  . YUPELRI 175 MCG/3ML SOLN USE 1 VIAL IN NEBULIZER DAILY 3 mL 11   No current facility-administered medications on file prior to visit.     Observations/Objective:  08/14/2019 O2 sats 96 % on room air , T 98.1, 152/74 , HR 75 bpm     01/26/2018- swallowing study-modified barium swallow- premature spill and delayed swallow trigger noted, no evidence of vestibular penetration or aspiration, please refer to speech pathologist report for complete details and recommendations, mild aspiration risk  07/28/2017-chest x-ray- changes of pulmonary fibrosis are again seen, no acute disease  09/17/2012-CT chest without contrast- diffuse bronchial wall thickening and moderate centrilobular and paraseptal emphysema, compatible with underlying COPD, some patchy areas of very mild peripheral groundglass attenuation, subpleural reticulation and peripheral bronchiectasis atelectasis could suggest interstitial lung disease/NSIP  06/01/2017-echocardiogram- LV ejection fraction 50 to [id number], systolic function normal, grade 1 diastolic dysfunction  06/15/2018-CT Angio- small segmental subsegmental right lower lobe pulmonary emboli, moderate to severe emphysema, consolidation and groundglass density in the posterior right upper lobe consistent with pneumonia.  Assessment and Plan: COPD exacerbation-low suspicion for COVID-19.  Patient has recently had pneumonia suspect with his dementia he is at high risk for recurrent bronchitis and/or pneumonia. Discussed getting a sputum culture however with his underlying dementia does not feel like that he would be able to obtain this. We will treat with broad-spectrum antibiotic has tolerated Augmentin in the past We will get him back for close follow-up with a follow-up chest x-ray  Recent diagnosis of pneumonia early November.  Was treated with complete course of antibiotics.  Did have clinical improvement.  Will recheck chest x-ray on return  Plan  Patient  Instructions  Augmentin 875mg  Twice daily  For 1 week , take with food Eat yogurt daily .  Continue on Mucinex Continue on Brovana, Yupelri and Hypertonic nebs  Continue on oxygen at bedtime Follow-up in 2 to 3 weeks with chest x-ray and as needed Please contact office for sooner follow up if symptoms do not improve or worsen or seek emergency care       Follow Up Instructions:   Follow-up in 2 to 3 weeks with chest x-ray.  Patient and family members were advised if symptoms do not improve or worsen he will need to seek emergency room care   I discussed the assessment and treatment plan with the patient. The patient was provided an opportunity to ask questions and all were answered. The patient agreed with the plan and demonstrated an understanding of the instructions.   The patient was advised to call back or seek an in-person evaluation if the symptoms worsen or if the condition fails to improve as anticipated.  I provided 25 minutes of non-face-to-face time during this encounter.   Rexene Edison, NP

## 2019-08-14 NOTE — Telephone Encounter (Signed)
I looked at previous messages to see if I could figure out who pt's private duty nurse is and in encounter from 06/07/19, pt's private duty nurse is Ander Purpura. [name] is not on the DPR but pt's daughter Ivin Booty has said that it is okay to talk with [name] about pt's condition.  [name], please see the mychart message from [place] in regards to pt and advise.

## 2019-08-14 NOTE — Telephone Encounter (Signed)
Attempted to call pt's private nurse but unable to reach. Left message on phone in regards to [place] needing to get pt scheduled for a video visit and also stated that I would call pt's wife.  Called and spoke with pt's wife Lee [name] letting her know that we needed to get pt scheduled for a video visit and she verbalized understanding. Nothing further needed.

## 2019-08-20 ENCOUNTER — Other Ambulatory Visit: Payer: Self-pay

## 2019-08-20 ENCOUNTER — Ambulatory Visit: Payer: Medicare Other | Admitting: Physician Assistant

## 2019-08-20 ENCOUNTER — Encounter: Payer: Self-pay | Admitting: Physician Assistant

## 2019-08-20 VITALS — BP 115/54 | HR 64 | Ht 66.0 in | Wt 226.0 lb

## 2019-08-20 DIAGNOSIS — J84112 Idiopathic pulmonary fibrosis: Secondary | ICD-10-CM

## 2019-08-20 DIAGNOSIS — I5032 Chronic diastolic (congestive) heart failure: Secondary | ICD-10-CM | POA: Diagnosis not present

## 2019-08-20 DIAGNOSIS — E785 Hyperlipidemia, unspecified: Secondary | ICD-10-CM

## 2019-08-20 DIAGNOSIS — I10 Essential (primary) hypertension: Secondary | ICD-10-CM | POA: Diagnosis not present

## 2019-08-20 DIAGNOSIS — Z9889 Other specified postprocedural states: Secondary | ICD-10-CM

## 2019-08-20 DIAGNOSIS — Z8679 Personal history of other diseases of the circulatory system: Secondary | ICD-10-CM

## 2019-08-20 DIAGNOSIS — I2699 Other pulmonary embolism without acute cor pulmonale: Secondary | ICD-10-CM

## 2019-08-20 DIAGNOSIS — Z8673 Personal history of transient ischemic attack (TIA), and cerebral infarction without residual deficits: Secondary | ICD-10-CM

## 2019-08-20 NOTE — Patient Instructions (Signed)
Medication Instructions:   Your physician recommends that you continue on your current medications as directed. Please refer to the Current Medication list given to you today.  *If you need a refill on your cardiac medications before your next appointment, please call your pharmacy*  Lab Work:  NONE ordered at this time of appointment   If you have labs (blood work) drawn today and your tests are completely normal, you will receive your results only by: [name] MyChart Message (if you have MyChart) OR . A paper copy in the mail If you have any lab test that is abnormal or we need to change your treatment, we will call you to review the results.  Testing/Procedures:  NONE ordered at this time of appointment   Follow-Up: At [facility], you and your health needs are our priority.  As part of our continuing mission to provide you with exceptional heart care, we have created designated Provider Care Teams.  These Care Teams include your primary Cardiologist (physician) and Advanced Practice Providers (APPs -  Physician Assistants and Nurse Practitioners) who all work together to provide you with the care you need, when you need it.  Your next appointment:   2-3 month(s)  The format for your next appointment:   In Person  Provider:   Glenetta Hew, MD  Other Instructions

## 2019-08-20 NOTE — Progress Notes (Signed)
Cardiology Office Note:    Date:  08/22/2019   ID:  Lee [name], DOB [birth year], MRN [id number]  PCP:  [name], MD  Cardiologist:  Glenetta Hew, MD  Electrophysiologist:  None   Referring MD: [name], MD   Chief Complaint  Patient presents with  . Follow-up    seen for Dr. Ellyn Hack.     History of Present Illness:    Lee [name] is a 85 y.o. male with a hx of mild NICM, LBBB, AAA s/p repair (Dr. Donnetta Hutching), O2 dependent COPD/pulmonary fibrosis, history of CVA, hypertension and hyperlipidemia.  Last Myoview obtained on 05/30/2017 showed EF 55%, inferior septal hypokinesis, no reversible ischemia or infarction.  More recently, patient was diagnosed with acute PE in October 2019.  Echocardiogram obtained in October 2019 showed normal EF, mild LVH, normal aortic and mitral valve.  More recently, he was diagnosed with pneumonia about a weeks ago.  He was last seen by Dr. Ellyn Hack on 07/29/2019 at which time it was concerning that he has gained 12 pounds.  His Lasix was increased to daily basis.  He was seen by his pulmonology office on 08/14/2019 for worsening wheezing, he was treated with Augmentin and prednisone.  Patient presents today for cardiology office follow-up.  According to his wife, he did the daily dosing of Lasix until 12/20 and has been able to transition back to 3 times weekly dosing.  No weight was able to be obtained as he was unable to stand up on the scale.  On physical exam, he has no lower extremity edema, although he does have crackles in his lung, I do not think this is fluid, I suspect he has crackles from both atelectasis and pulmonary fibrosis.  At this time, I do not recommend any further adjustment of the diuretic.  She is aware that if the patient started having pitting edema in the leg, she can give him extra dose of Lasix and potassium.  Any further aggressive diuresis of this patient will unlikely improve his breathing issue however make him prone to  weakness and dehydration.  He is a very poor historian and unable to answer majority of the questions during today's interview.  The interview was conducted with the help with his wife will take care of him.  They are also getting home nurses who come to visit him often and assist in his care.  Unfortunately majority his issue at this point is all pulmonary.  He has very high risk for aspiration induced pneumonia.  He has upcoming follow-up with Dr. Chase Caller.  He can follow-up with Dr. Ellyn Hack in 2 to 3 months.  Past Medical History:  Diagnosis Date  . Allergic rhinitis   . Anxiety   . Basal cell carcinoma of face   . Cardiomyopathy ([place]) 04/2015   EF 40-45% by echo. Apical anterior akinesis seen on echo, not confirmed on Myoview was negative for ischemia. Myoview suggested a small basal inferoseptal defect.  . Cholecystitis    a. complex admit 04/2015 due to sepsis due to acute Escherichia coli cholecystitis and right lower lobe CAP and Citrobacter UTI.  [name] Chronic respiratory failure ([place])    a. on home O2 after discharge 04/2015.  [name] Chronic systolic CHF (congestive heart failure) ([place])    a. Dx 04/2015 during complex admission for cholecystitis - EF 40-45% +WMA.  [name] COPD (chronic obstructive pulmonary disease) ([place])   . Erectile dysfunction   . History of endovascular stent graft for abdominal aortic aneurysm  2002   a. s/p repair  Followed by Dr. Donnetta Hutching (stable evaluation 01/2106)  . History of hiatal hernia   . HTN (hypertension)   . Hyperlipidemia   . Interstitial lung disease ([place])    a. followed by pulm.  [name] LBBB (left bundle branch block)    a. Intermittent - seen in 2013, not in 09/2013, but again in 04/2015.  . Obesity   . On home oxygen therapy    "just at night" (10/05/2015)  . PVD (peripheral vascular disease) ([place])   . Shingles   . Stroke [facility])    a. H/o aphasia - was told he'd had ministrokes. Imaging 09/2014 revealed prior infarct.    Past Surgical History:  Procedure  Laterality Date  . AORTA SURGERY  2002  . CHOLECYSTECTOMY N/A 10/05/2015   Procedure: LAPAROSCOPIC CHOLECYSTECTOMY  ;  Surgeon: Autumn Messing III, MD;  Location: [facility];  Service: General;  Laterality: N/A;  . [place]  . INTRAOPERATIVE CHOLANGIOGRAM  10/05/2015   Procedure: INTRAOPERATIVE CHOLANGIOGRAM;  Surgeon: Autumn Messing III, MD;  Location: [place];  Service: General;;  . LAPAROSCOPIC CHOLECYSTECTOMY  10/05/2015  . NM MYOVIEW LTD  05/2015   EF 49%. Small size, mild severity perfusion defect in the basal-mid inferoseptal wall. LOW RISK. No ischemia. Defect thought to be related to LBBB artifacts and not true lesion. This could also explain mild systolic dysfunction.  . TRANSTHORACIC ECHOCARDIOGRAM  04/2015   (In setting of acute illness).EF 40-45% with mid-apical anteroseptal akinesis. Moderately increased PA pressures of roughly 48 mmHg.  [name] URETHRAL STRICTURE DILATATION  late 1970s   urinary tract stretch    Current Medications: Current Meds  Medication Sig  . albuterol (PROVENTIL HFA;VENTOLIN HFA) 108 (90 Base) MCG/ACT inhaler Inhale 2 puffs into the lungs every 6 (six) hours as needed for wheezing or shortness of breath.  [name] albuterol (PROVENTIL) (2.5 MG/3ML) 0.083% nebulizer solution Take 3 mLs (2.5 mg total) by nebulization every 6 (six) hours as needed for wheezing or shortness of breath. (Patient taking differently: Take 2.5 mg by nebulization 2 (two) times daily. )  . [EXPIRED] amoxicillin-clavulanate (AUGMENTIN) 875-125 MG tablet Take 1 tablet by mouth 2 (two) times daily for 7 days.  [name] apixaban (ELIQUIS) 5 MG TABS tablet Take 1 tablet (5 mg total) by mouth 2 (two) times daily. (Patient taking differently: Take 5 mg by mouth 2 (two) times daily with a meal. )  . arformoterol (BROVANA) 15 MCG/2ML NEBU Take 2 mLs (15 mcg total) by nebulization 2 (two) times daily.  . carvedilol (COREG) 12.5 MG tablet TAKE 1 TABLET (12.5 MG TOTAL) BY MOUTH 2 (TWO) TIMES DAILY WITH A MEAL. (Patient  taking differently: Take 12.5 mg by mouth 2 (two) times daily with a meal. )  . cholecalciferol (VITAMIN D) 1000 units tablet Take 1 tablet (1,000 Units total) by mouth daily. (Patient taking differently: Take 1,000 Units by mouth daily with supper. )  . clopidogrel (PLAVIX) 75 MG tablet Take 1 tablet (75 mg total) by mouth daily. (Patient taking differently: Take 75 mg by mouth daily at 3 pm. )  . ezetimibe (ZETIA) 10 MG tablet Take 1 tablet (10 mg total) by mouth daily.  . feeding supplement, ENSURE ENLIVE, (ENSURE ENLIVE) LIQD Take 237 mLs by mouth 2 (two) times daily between meals. (Patient taking differently: Take 237 mLs by mouth daily at 3 pm. )  . furosemide (LASIX) 20 MG tablet Take 1 tablet (20 mg total) by mouth every Monday,  Wednesday, and Friday.  . losartan (COZAAR) 50 MG tablet Take 1 tablet (50 mg total) by mouth daily.  . Multiple Vitamin (MULTIVITAMIN WITH MINERALS) TABS tablet Take 1 tablet by mouth daily.  . nitroGLYCERIN (NITROSTAT) 0.4 MG SL tablet Place 1 tablet (0.4 mg total) under the tongue every 5 (five) minutes as needed for chest pain.  . pantoprazole (PROTONIX) 40 MG tablet Take 1 tablet (40 mg total) by mouth daily. (Patient taking differently: Take 40 mg by mouth daily before breakfast. )  . Pitavastatin Calcium (LIVALO) 2 MG TABS Take 1 tablet (2 mg total) by mouth See admin instructions. Every 3rd day (Patient taking differently: Take 1 mg by mouth every 3 (three) days. )  . potassium chloride (MICRO-K) 10 MEQ CR capsule Take 1 capsule (10 mEq total) by mouth every Monday, Wednesday, and Friday. **Take with lasix**  . Probiotic Product ([place]) CAPS Take 1 capsule by mouth daily.  . Revefenacin (YUPELRI) 175 MCG/3ML SOLN Inhale 1 vial into the lungs daily.  . sodium chloride HYPERTONIC 3 % nebulizer solution Once daily in the morning  . YUPELRI 175 MCG/3ML SOLN USE 1 VIAL IN NEBULIZER DAILY     Allergies:   Ciprofloxacin and Tape   Social History     Socioeconomic History  . Marital status: Married    Spouse name: Not on file  . Number of children: Not on file  . Years of education: Not on file  . Highest education level: Not on file  Occupational History  . Occupation: retired  Tobacco Use  . Smoking status: Former Smoker    Packs/day: 1.00    Years: 25.00    Pack years: 25.00    Types: Cigarettes    Start date: 01/11/1951    Quit date: 08/22/1974    Years since quitting: 45.0  . Smokeless tobacco: Never Used  Substance and Sexual Activity  . Alcohol use: No  . Drug use: No  . Sexual activity: Not on file  Other Topics Concern  . Not on file  Social History Narrative  . Not on file   Social Determinants of Health   Financial Resource Strain:   . Difficulty of Paying Living Expenses: Not on file  Food Insecurity:   . Worried About Charity fundraiser in the Last Year: Not on file  . Ran Out of Food in the Last Year: Not on file  Transportation Needs:   . Lack of Transportation (Medical): Not on file  . Lack of Transportation (Non-Medical): Not on file  Physical Activity:   . Days of Exercise per Week: Not on file  . Minutes of Exercise per Session: Not on file  Stress:   . Feeling of Stress : Not on file  Social Connections:   . Frequency of Communication with Friends and Family: Not on file  . Frequency of Social Gatherings with Friends and Family: Not on file  . Attends Religious Services: Not on file  . Active Member of Clubs or Organizations: Not on file  . Attends Archivist Meetings: Not on file  . Marital Status: Not on file     Family History: The patient's family history includes Colon cancer in his brother; Heart disease in his father; Hypertension in his mother; Stroke in his father.  ROS:   Please see the history of present illness.     All other systems reviewed and are negative.  EKGs/Labs/Other Studies Reviewed:    The following studies were  reviewed today:  Echo  06/18/2018 Study Conclusions  - Left ventricle: POor acoustic windows limit study, even with use   of Definity. Overall LVEF appears normal. The cavity size was   normal. Wall thickness was increased in a pattern of mild LVH.  EKG:  EKG is not ordered today.   Recent Labs: 08/24/2018: ALT 42 04/03/2019: BUN 15; Creatinine, Ser 1.22; Hemoglobin 13.0; NT-Pro BNP 317; Platelets 265.0; Potassium 3.7; Sodium 138  Recent Lipid Panel No results found for: CHOL, TRIG, HDL, CHOLHDL, VLDL, LDLCALC, LDLDIRECT  Physical Exam:    VS:  BP (!) 115/54   Pulse 64   Ht 5\' 6"  (1.676 m)   Wt 226 lb (102.5 kg)   SpO2 97%   BMI 36.48 kg/m     Wt Readings from Last 3 Encounters:  08/20/19 226 lb (102.5 kg)  07/29/19 226 lb (102.5 kg)  04/24/19 217 lb 3.2 oz (98.5 kg)     GEN:  Well nourished, well developed in no acute distress HEENT: Normal NECK: No JVD; No carotid bruits LYMPHATICS: No lymphadenopathy CARDIAC: RRR, no murmurs, rubs, gallops RESPIRATORY:  Clear to auscultation without rales, wheezing or rhonchi  ABDOMEN: Soft, non-tender, non-distended MUSCULOSKELETAL:  No edema; No deformity  SKIN: Warm and dry NEUROLOGIC:  Alert and oriented x 3 PSYCHIATRIC:  Normal affect   ASSESSMENT:    1. Chronic diastolic heart failure ([place])   2. S/P AAA repair   3. IPF (idiopathic pulmonary fibrosis) ([place])   4. Essential hypertension   5. Hyperlipidemia with target LDL less than 100   6. H/O: CVA (cerebrovascular accident)   7. Pulmonary embolism, other, unspecified chronicity, unspecified whether acute cor pulmonale present ([place])    PLAN:    In order of problems listed above:  1. Chronic diastolic heart failure: He was recently treated with a increased dose of diuretic, he has since went back to the previous dose.  He appears to be euvolemic on today's physical exam.  I recommended continue on the current therapy.  If he does have lower extremity edema, his wife may give him additional half  a tablet of Lasix on an as-needed basis.  2. History of AAA repair: Followed by Dr. Donnetta Hutching  3. Idiopathic pulmonary fibrosis: I suspect majority of his baseline shortness of breath is related to pulmonary issue.  He does not appear to be volume overloaded on physical exam.  4. History of PE: On Eliquis and Plavix  5. hypertension: Blood pressure stable on current therapy  6. Hyperlipidemia: Continue atorvastatin  7. History of CVA: No recent recurrence.   Medication Adjustments/Labs and Tests Ordered: Current medicines are reviewed at length with the patient today.  Concerns regarding medicines are outlined above.  No orders of the defined types were placed in this encounter.  No orders of the defined types were placed in this encounter.   Patient Instructions  Medication Instructions:   Your physician recommends that you continue on your current medications as directed. Please refer to the Current Medication list given to you today.  *If you need a refill on your cardiac medications before your next appointment, please call your pharmacy*  Lab Work:  NONE ordered at this time of appointment   If you have labs (blood work) drawn today and your tests are completely normal, you will receive your results only by: [name] MyChart Message (if you have MyChart) OR . A paper copy in the mail If you have any lab test that is abnormal or  we need to change your treatment, we will call you to review the results.  Testing/Procedures:  NONE ordered at this time of appointment   Follow-Up: At [facility], you and your health needs are our priority.  As part of our continuing mission to provide you with exceptional heart care, we have created designated Provider Care Teams.  These Care Teams include your primary Cardiologist (physician) and Advanced Practice Providers (APPs -  Physician Assistants and Nurse Practitioners) who all work together to provide you with the care you need, when you  need it.  Your next appointment:   2-3 month(s)  The format for your next appointment:   In Person  Provider:   Glenetta Hew, MD  Other Instructions      Signed, Almyra Deforest, [place]  08/22/2019 12:37 AM    [place]

## 2019-08-22 ENCOUNTER — Encounter: Payer: Self-pay | Admitting: Physician Assistant

## 2019-08-28 ENCOUNTER — Ambulatory Visit (INDEPENDENT_AMBULATORY_CARE_PROVIDER_SITE_OTHER): Payer: Medicare Other

## 2019-08-28 ENCOUNTER — Ambulatory Visit (INDEPENDENT_AMBULATORY_CARE_PROVIDER_SITE_OTHER): Payer: Medicare Other | Admitting: Acute Care

## 2019-08-28 ENCOUNTER — Other Ambulatory Visit: Payer: Self-pay

## 2019-08-28 ENCOUNTER — Encounter: Payer: Self-pay | Admitting: Acute Care

## 2019-08-28 ENCOUNTER — Telehealth: Payer: Self-pay | Admitting: Internal Medicine

## 2019-08-28 VITALS — BP 138/64 | HR 72

## 2019-08-28 DIAGNOSIS — J441 Chronic obstructive pulmonary disease with (acute) exacerbation: Secondary | ICD-10-CM | POA: Diagnosis not present

## 2019-08-28 DIAGNOSIS — Z8701 Personal history of pneumonia (recurrent): Secondary | ICD-10-CM | POA: Diagnosis not present

## 2019-08-28 DIAGNOSIS — J690 Pneumonitis due to inhalation of food and vomit: Secondary | ICD-10-CM

## 2019-08-28 DIAGNOSIS — R05 Cough: Secondary | ICD-10-CM

## 2019-08-28 DIAGNOSIS — R059 Cough, unspecified: Secondary | ICD-10-CM

## 2019-08-28 MED ORDER — PREDNISONE 10 MG PO TABS: ORAL_TABLET | ORAL | 0 refills | Status: DC

## 2019-08-28 MED ORDER — AMOXICILLIN-POT CLAVULANATE 875-125 MG PO TABS: 1.0000 | ORAL_TABLET | Freq: Two times a day (BID) | ORAL | 0 refills | Status: DC

## 2019-08-28 MED ORDER — FLUTTER DEVI: 0 refills | Status: AC

## 2019-08-28 MED ORDER — BENZONATATE 100 MG PO CAPS: 100.0000 mg | ORAL_CAPSULE | Freq: Two times a day (BID) | ORAL | 0 refills | Status: AC | PRN

## 2019-08-28 NOTE — Telephone Encounter (Signed)
Offered virtual visit - pt wife states that pt has dementia so this may be difficult - sent message to triage to eval -pr

## 2019-08-28 NOTE — Progress Notes (Signed)
History of Present Illness Lee [name] is a 86 y.o. male  former smoker followed for Emphysema with ILD , and COPD.  Patient has underlying dementia. He is followed by Dr. Chase Caller.  Synopsis 86 year old male former smoker followed in our office for ILD as well as emphysema.  Patient also has recurrent bronchitis-like episodes that are believed to be suspected aspiration.  Patient has recurrent swallow studies that report that he has a mild aspiration risk.  Patient presenting to our office today 08/27/2018 with his spouse who had concerns regarding the patient's worsened dyspnea today as well as increased fatigue. She called 911 and they advised her to have the patient seen today. He is audibly wheezing today in the office.   08/28/2019  Pt presents for follow up. Per is wife he developed a cough 08/28/2019 at 4:30 am. His nurse gave him a saline neb, and he coughed up some yellow secretions with some blood tinges mucus. Pt wife then called back and stated  they had to call EMS. Pt's breathing and coughing have gotten worse. EMS listened to his lungs and felt like he needed to be seen. They told the wife that he could have pneumonia, and needed to be seen at the office, but he was not transported to the hospital. He was scheduled to see me this afternoon.   Pt. Presents for OV. Per the CMA who screened the patient he has no fever, O2 sats were 96% on room air.Pt. wears oxygen at night only. Patient has underlying dementia.  He is accompanied by his wife and caregiver.  Says over the last 2 days patient has had increased wheezing and cough is more congested with thick yellow mucus that is blood tinges at times. Patient is compliant with an  aggressive pulmonary regimen with Brovana  neb Twice daily , Yupelri Neb daily  And Hypertonic Neb daily . Per his wife he has had some swelling in his feet. He currently takes lasix every other day. He has instructions to take additional lasix as needed for  swelling. He has not had an additional dose today. He is wheezing , and per his wife his cough is worse. Secretions are light yellow, with slight blood tinge at one point.  ( Pt. Is on Eliquis). There has been no further pink blood tinge to his secretions. He has had no fever, and sats are stable on RA. He has had a cough  His wife is considering using thick it in his thin liquids to see if that helps with his aspiration.   We will check a CXR today. We will check CBC and BMET. We will treat with broad-spectrum antibiotic. He  has tolerated Augmentin in the past We will get him back for close follow-up with a follow-up chest x-ray in 2-3 weeks  Test Results: 08/28/2019 CXR Cardiac shadow is stable. Aortic calcifications are seen. Stable scarring is noted in the lungs bilaterally. No new focal infiltrate is seen. No sizable effusion is noted. No acute bony abnormality is seen.  IMPRESSION: Improved aeration in the bases.  Some stable scarring is seen.   01/11/2019-chest x-ray- low volume AP exam with increased bilateral interstitial markings concerning for acutely superimposed infection or edema, underlying emphysematous change of fibrosis at bases, mild cardiomegaly  04/02/2019-chest x-ray- slight increase in bi plan:  01/26/2018- swallowing study-modified barium swallow- premature spill and delayed swallow trigger noted, no evidence of vestibular penetration or aspiration, please refer to speech pathologist report for complete details  and recommendations, mild aspiration risk  03/05/2019-swallow study- mild aspiration risk, dysphagia 3, home meds with pure, oral care twice daily, can use cup or straw, home meds with pured foods, minimize environmental distractions, eat at slow rate small sips and bites  07/28/2017-chest x-ray- changes of pulmonary fibrosis are again seen, no acute disease  09/17/2012-CT chest without contrast- diffuse bronchial wall thickening and moderate centrilobular  and paraseptal emphysema, compatible with underlying COPD, some patchy areas of very mild peripheral groundglass attenuation, subpleural reticulation and peripheral bronchiectasis atelectasis could suggest interstitial lung disease/NSIP  06/01/2017-echocardiogram- LV ejection fraction 50 to [id number], systolic function normal, grade 1 diastolic dysfunction  123456 Angio- small segmental subsegmental right lower lobe pulmonary emboli, moderate to severe emphysema, consolidation and groundglass density in the posterior right upper lobe consistent with pneumonia. CBC Latest Ref Rng & Units 04/03/2019 08/14/2018 08/13/2018  WBC 4.0 - 10.5 K/uL 10.7(H) 8.7 14.7(H)  Hemoglobin 13.0 - 17.0 g/dL 13.0 13.0 13.9  Hematocrit 39.0 - 52.0 % 38.8(L) 39.2 43.9  Platelets 150.0 - 400.0 K/uL 265.0 255 275    BMP Latest Ref Rng & Units 04/03/2019 08/24/2018 08/13/2018  Glucose 70 - 99 mg/dL 105(H) 81 104(H)  BUN 6 - 23 mg/dL 15 19 9   Creatinine 0.40 - 1.50 mg/dL 1.22 1.04 0.90  Sodium 135 - 145 mEq/L 138 136 136  Potassium 3.5 - 5.1 mEq/L 3.7 4.4 4.4  Chloride 96 - 112 mEq/L 105 102 104  CO2 19 - 32 mEq/L 26 28 20(L)  Calcium 8.4 - 10.5 mg/dL 9.4 8.3(L) 8.8(L)    BNP    Component Value Date/Time   BNP 99.8 08/13/2018 1513    ProBNP    Component Value Date/Time   PROBNP 317 04/03/2019 1149   PROBNP 39.0 08/24/2018 1212    PFT    Component Value Date/Time   FEV1PRE 2.28 04/18/2014 1346   FEV1POST 2.37 04/18/2014 1346   FVCPRE 3.10 04/18/2014 1346   FVCPOST 3.11 04/18/2014 1346   TLC 7.84 04/18/2014 1346   DLCOUNC 14.91 04/18/2014 1346   PREFEV1FVCRT 74 04/18/2014 1346   PSTFEV1FVCRT 76 04/18/2014 1346    DG Chest 2 View  Result Date: 08/28/2019 CLINICAL DATA:  Follow-up pneumonia EXAM: CHEST - 2 VIEW COMPARISON:  04/02/2019 FINDINGS: Cardiac shadow is stable. Aortic calcifications are seen. Stable scarring is noted in the lungs bilaterally. No new focal infiltrate is seen. No sizable  effusion is noted. No acute bony abnormality is seen. IMPRESSION: Improved aeration in the bases.  Some stable scarring is seen. Electronically Signed   By: Inez Catalina M.D.   On: 08/28/2019 15:10     Past medical hx Past Medical History:  Diagnosis Date  . Allergic rhinitis   . Anxiety   . Basal cell carcinoma of face   . Cardiomyopathy ([place]) 04/2015   EF 40-45% by echo. Apical anterior akinesis seen on echo, not confirmed on Myoview was negative for ischemia. Myoview suggested a small basal inferoseptal defect.  . Cholecystitis    a. complex admit 04/2015 due to sepsis due to acute Escherichia coli cholecystitis and right lower lobe CAP and Citrobacter UTI.  [name] Chronic respiratory failure ([place])    a. on home O2 after discharge 04/2015.  [name] Chronic systolic CHF (congestive heart failure) ([place])    a. Dx 04/2015 during complex admission for cholecystitis - EF 40-45% +WMA.  [name] COPD (chronic obstructive pulmonary disease) ([place])   . Erectile dysfunction   . History of endovascular stent graft for abdominal  aortic aneurysm 2002   a. s/p repair  Followed by Dr. Donnetta Hutching (stable evaluation 01/2106)  . History of hiatal hernia   . HTN (hypertension)   . Hyperlipidemia   . Interstitial lung disease ([place])    a. followed by pulm.  [name] LBBB (left bundle branch block)    a. Intermittent - seen in 2013, not in 09/2013, but again in 04/2015.  . Obesity   . On home oxygen therapy    "just at night" (10/05/2015)  . PVD (peripheral vascular disease) ([place])   . Shingles   . Stroke [facility])    a. H/o aphasia - was told he'd had ministrokes. Imaging 09/2014 revealed prior infarct.     Social History   Tobacco Use  . Smoking status: Former Smoker    Packs/day: 1.00    Years: 25.00    Pack years: 25.00    Types: Cigarettes    Start date: 01/11/1949    Quit date: 08/22/1974    Years since quitting: 45.0  . Smokeless tobacco: Never Used  Substance Use Topics  . Alcohol use: No  . Drug use: No    Mr.Niebla  reports that he quit smoking about 45 years ago. His smoking use included cigarettes. He started smoking about 68 years ago. He has a 25.00 pack-year smoking history. He has never used smokeless tobacco. He reports that he does not drink alcohol or use drugs.  Tobacco Cessation: Former smoker quit 1976  Past surgical hx, Family hx, Social hx all reviewed.  Current Outpatient Medications on File Prior to Visit  Medication Sig  . albuterol (PROVENTIL HFA;VENTOLIN HFA) 108 (90 Base) MCG/ACT inhaler Inhale 2 puffs into the lungs every 6 (six) hours as needed for wheezing or shortness of breath.  [name] albuterol (PROVENTIL) (2.5 MG/3ML) 0.083% nebulizer solution Take 3 mLs (2.5 mg total) by nebulization every 6 (six) hours as needed for wheezing or shortness of breath. (Patient taking differently: Take 2.5 mg by nebulization 2 (two) times daily. )  . apixaban (ELIQUIS) 5 MG TABS tablet Take 1 tablet (5 mg total) by mouth 2 (two) times daily. (Patient taking differently: Take 5 mg by mouth 2 (two) times daily with a meal. )  . arformoterol (BROVANA) 15 MCG/2ML NEBU Take 2 mLs (15 mcg total) by nebulization 2 (two) times daily.  . carvedilol (COREG) 12.5 MG tablet TAKE 1 TABLET (12.5 MG TOTAL) BY MOUTH 2 (TWO) TIMES DAILY WITH A MEAL. (Patient taking differently: Take 12.5 mg by mouth 2 (two) times daily with a meal. )  . cholecalciferol (VITAMIN D) 1000 units tablet Take 1 tablet (1,000 Units total) by mouth daily. (Patient taking differently: Take 1,000 Units by mouth daily with supper. )  . clopidogrel (PLAVIX) 75 MG tablet Take 1 tablet (75 mg total) by mouth daily. (Patient taking differently: Take 75 mg by mouth daily at 3 pm. )  . ezetimibe (ZETIA) 10 MG tablet Take 1 tablet (10 mg total) by mouth daily.  . feeding supplement, ENSURE ENLIVE, (ENSURE ENLIVE) LIQD Take 237 mLs by mouth 2 (two) times daily between meals. (Patient taking differently: Take 237 mLs by mouth daily at 3 pm. )  . furosemide  (LASIX) 20 MG tablet Take 1 tablet (20 mg total) by mouth every Monday, Wednesday, and Friday.  . losartan (COZAAR) 50 MG tablet Take 1 tablet (50 mg total) by mouth daily.  . Multiple Vitamin (MULTIVITAMIN WITH MINERALS) TABS tablet Take 1 tablet by mouth daily.  . nitroGLYCERIN (NITROSTAT)  0.4 MG SL tablet Place 1 tablet (0.4 mg total) under the tongue every 5 (five) minutes as needed for chest pain.  . pantoprazole (PROTONIX) 40 MG tablet Take 1 tablet (40 mg total) by mouth daily. (Patient taking differently: Take 40 mg by mouth daily before breakfast. )  . Pitavastatin Calcium (LIVALO) 2 MG TABS Take 1 tablet (2 mg total) by mouth See admin instructions. Every 3rd day (Patient taking differently: Take 1 mg by mouth every 3 (three) days. )  . potassium chloride (MICRO-K) 10 MEQ CR capsule Take 1 capsule (10 mEq total) by mouth every Monday, Wednesday, and Friday. **Take with lasix**  . Probiotic Product ([place]) CAPS Take 1 capsule by mouth daily.  . Revefenacin (YUPELRI) 175 MCG/3ML SOLN Inhale 1 vial into the lungs daily.  . sodium chloride HYPERTONIC 3 % nebulizer solution Once daily in the morning  . YUPELRI 175 MCG/3ML SOLN USE 1 VIAL IN NEBULIZER DAILY   No current facility-administered medications on file prior to visit.     Allergies  Allergen Reactions  . Ciprofloxacin Other (See Comments)    Tingle feeling throughout body  . Tape Other (See Comments)    Redness, Please use "paper" tape    Review Of Systems:  Constitutional:   No  weight loss, night sweats,  Fevers, chills, fatigue, or  lassitude.  HEENT:   No headaches,  Difficulty swallowing,  Tooth/dental problems, or  Sore throat,                No sneezing, itching, ear ache, nasal congestion, post nasal drip,   CV:  No chest pain,  Orthopnea, PND, + swelling in lower extremities, No anasarca, dizziness, palpitations, syncope.   GI  No heartburn, indigestion, abdominal pain, nausea, vomiting, diarrhea,  change in bowel habits, loss of appetite, bloody stools.   Resp: + shortness of breath with exertion less  at rest.  + excess mucus, + productive cough,  No non-productive cough,  No coughing up of blood.  + ( yellow)  change in color of mucus.  + wheezing.  No chest wall deformity  Skin: no rash or lesions.  GU: no dysuria, change in color of urine, no urgency or frequency.  No flank pain, no hematuria   MS:  No joint pain or swelling.  No decreased range of motion.  No back pain.  Psych:  No change in mood or affect. No depression or anxiety.  No memory loss.   Vital Signs BP 138/64 (BP Location: Left Arm, Cuff Size: Normal)   Pulse 72   SpO2 96%    Physical Exam:  General- No distress,  A&Ox3, confused and pleasant ENT: No sinus tenderness, TM clear, pale nasal mucosa, no oral exudate,no post nasal drip, no LAN Cardiac: S1, S2, regular rate and rhythm, no murmur Chest: + Exp  Wheeze/ No  rales/ dullness; no accessory muscle use, no nasal flaring, no sternal retractions, diminished per bases. Abd.: Soft Non-tender, ND, BS + Ext: No clubbing cyanosis, trace edema, legs are wrapped and he is wearing compression hose. Neuro: Dementia. Physically deconditioned, in wheel chair Skin: No rashes, No lesions, warm and dry Psych: normal mood and behavior   Assessment/Plan  COPD / ILD Flare We will call you with the results of your CXR as soon as we have the results We will send in a prescription for Augmentin 875 every 12 hours x 10 days Eat yogurt daily .  We will check some labs today (  CBC and BMET) We will prescribe a  prednisone taper  Prednisone taper; 10 mg tablets: 4 tabs x 2 days, 3 tabs x 2 days, 2 tabs x 2 days 1 tab x 2 days then stop. Continue Mucinex 1200 mg twice daily Flutter Valve 3-4 breaths  Twice daily. We will send in some Tessalon Perles, 100 mg every 12 hours as needed for cough Continue on Brovana, Yupelri and Hypertonic nebs  Continue on oxygen at  bedtime Follow up with Dr. Chase Caller as is scheduled 1/19/20201 Continue Lasix per Cardiology Weigh daily, assess for swelling in your legs. Call cards for increase in weight and lower extremity swelling Please contact office for sooner follow up if symptoms do not improve or worsen or seek emergency care      Heart Failure Plan Continue Lasix per Cardiology Weigh daily Call cards for increase in weight and lower extremity swelling   I spent 35 minutes providing care today.  Magdalen Spatz, NP 08/28/2019  4:33 PM

## 2019-08-28 NOTE — Patient Instructions (Addendum)
It is good to see you today. We will call you with the results of your CXR as soon as we have the results We will send in a prescription for Augmentin 875 every 12 hours x 10 days We will check some labs today ( CBC and BMET) We will prescribe a  prednisone taper  Prednisone taper; 10 mg tablets: 4 tabs x 2 days, 3 tabs x 2 days, 2 tabs x 2 days 1 tab x 2 days then stop. Continue Mucinex 1200 mg twice daily Flutter Valve 3-4 breaths  Twice daily. We will send in some Tessalon Perles, 100 mg every 12 hours as needed for cough Continue on Brovana, Yupelri and Hypertonic nebs  Continue on oxygen at bedtime Follow-up in 2 to 3 weeks with chest x-ray and as needed Continue Lasix per Cardiology Weigh daily, assess for swelling in your legs. Call cards for increase in weight and lower extremity swelling Follow up with Dr. Chase Caller 09/10/2019 as is already scheduled Please contact office for sooner follow up if symptoms do not improve or worsen or seek emergency care

## 2019-08-28 NOTE — Telephone Encounter (Signed)
Called and spoke with pt's wife Enid Derry who stated pt began coughing around 4am 08/28/19. Enid Derry said pt's nurse aid gave him saline neb tx to see if that would help.   Enid Derry said that pt did cough up some yellow phlegm that had a little bit of pink in color which she is unsure if this was due to pt coughing so much trying to get it out.  Enid Derry said that pt's O2 sats were 96% on room air. Pt does wear O2 at night only.  Enid Derry said that pt has been seeming more tired but overnight pt did not sleep that well overnight.  Pt has not had any fever.  Enid Derry said that they had some trouble trying to get pt to eat this morning which she wonders if it could be due to the cough.  Enid Derry wants recommendations to help with pt. MR, please advise. Thanks!

## 2019-08-28 NOTE — Telephone Encounter (Signed)
Spoke with pt's wife. States that since her last message, they had to call EMS. Pt's breathing and coughing have gotten worse. EMS listened to his lungs and felt like he needed to be seen. Pt could possibly have PNA and needs an xray. He has been scheduled with Judson Roch today at 1430.

## 2019-08-29 NOTE — Progress Notes (Signed)
Cxr better

## 2019-09-10 ENCOUNTER — Ambulatory Visit (INDEPENDENT_AMBULATORY_CARE_PROVIDER_SITE_OTHER): Payer: Medicare Other | Admitting: Internal Medicine

## 2019-09-10 ENCOUNTER — Other Ambulatory Visit: Payer: Self-pay

## 2019-09-10 ENCOUNTER — Other Ambulatory Visit: Payer: Self-pay | Admitting: Internal Medicine

## 2019-09-10 ENCOUNTER — Encounter: Payer: Self-pay | Admitting: Internal Medicine

## 2019-09-10 VITALS — BP 116/66 | HR 67 | Ht 66.0 in | Wt 215.0 lb

## 2019-09-10 DIAGNOSIS — J849 Interstitial pulmonary disease, unspecified: Secondary | ICD-10-CM

## 2019-09-10 DIAGNOSIS — J439 Emphysema, unspecified: Secondary | ICD-10-CM

## 2019-09-10 DIAGNOSIS — R062 Wheezing: Secondary | ICD-10-CM

## 2019-09-10 DIAGNOSIS — J8410 Pulmonary fibrosis, unspecified: Secondary | ICD-10-CM

## 2019-09-10 DIAGNOSIS — R059 Cough, unspecified: Secondary | ICD-10-CM

## 2019-09-10 DIAGNOSIS — J84112 Idiopathic pulmonary fibrosis: Secondary | ICD-10-CM

## 2019-09-10 DIAGNOSIS — R05 Cough: Secondary | ICD-10-CM | POA: Diagnosis not present

## 2019-09-10 DIAGNOSIS — I2609 Other pulmonary embolism with acute cor pulmonale: Secondary | ICD-10-CM

## 2019-09-10 DIAGNOSIS — J441 Chronic obstructive pulmonary disease with (acute) exacerbation: Secondary | ICD-10-CM

## 2019-09-10 LAB — CBC WITH DIFFERENTIAL/PLATELET
Basophils Absolute: 0 10*3/uL (ref 0.0–0.1)
Basophils Relative: 0.3 % (ref 0.0–3.0)
Eosinophils Absolute: 0.5 10*3/uL (ref 0.0–0.7)
Eosinophils Relative: 4.5 % (ref 0.0–5.0)
HCT: 40.5 % (ref 39.0–52.0)
Hemoglobin: 13.5 g/dL (ref 13.0–17.0)
Lymphocytes Relative: 14.5 % (ref 12.0–46.0)
Lymphs Abs: 1.7 10*3/uL (ref 0.7–4.0)
MCHC: 33.3 g/dL (ref 30.0–36.0)
MCV: 94.8 fl (ref 78.0–100.0)
Monocytes Absolute: 1.2 10*3/uL — ABNORMAL HIGH (ref 0.1–1.0)
Monocytes Relative: 10.1 % (ref 3.0–12.0)
Neutro Abs: 8.1 10*3/uL — ABNORMAL HIGH (ref 1.4–7.7)
Neutrophils Relative %: 70.6 % (ref 43.0–77.0)
Platelets: 236 10*3/uL (ref 150.0–400.0)
RBC: 4.27 Mil/uL (ref 4.22–5.81)
RDW: 13.9 % (ref 11.5–15.5)
WBC: 11.5 10*3/uL — ABNORMAL HIGH (ref 4.0–10.5)

## 2019-09-10 LAB — HEPATIC FUNCTION PANEL
ALT: 20 U/L (ref 0–53)
AST: 16 U/L (ref 0–37)
Albumin: 3.5 g/dL (ref 3.5–5.2)
Alkaline Phosphatase: 54 U/L (ref 39–117)
Bilirubin, Direct: 0.1 mg/dL (ref 0.0–0.3)
Total Bilirubin: 0.6 mg/dL (ref 0.2–1.2)
Total Protein: 6.2 g/dL (ref 6.0–8.3)

## 2019-09-10 LAB — BASIC METABOLIC PANEL
BUN: 15 mg/dL (ref 6–23)
CO2: 28 mEq/L (ref 19–32)
Calcium: 9 mg/dL (ref 8.4–10.5)
Chloride: 107 mEq/L (ref 96–112)
Creatinine, Ser: 1.02 mg/dL (ref 0.40–1.50)
GFR: 69.11 mL/min (ref >60.00)
Glucose, Bld: 103 mg/dL — ABNORMAL HIGH (ref 70–99)
Potassium: 4.3 mEq/L (ref 3.5–5.1)
Sodium: 140 mEq/L (ref 135–145)

## 2019-09-10 LAB — BRAIN NATRIURETIC PEPTIDE: Pro B Natriuretic peptide (BNP): 92 pg/mL (ref 0.0–100.0)

## 2019-09-10 LAB — D-DIMER, QUANTITATIVE: D-Dimer, Quant: 0.67 mcg/mL FEU — ABNORMAL HIGH (ref <0.50)

## 2019-09-10 MED ORDER — DOXYCYCLINE HYCLATE 100 MG PO TABS: 100.0000 mg | ORAL_TABLET | Freq: Two times a day (BID) | ORAL | 0 refills | Status: DC

## 2019-09-10 MED ORDER — PREDNISONE 10 MG PO TABS: ORAL_TABLET | ORAL | 0 refills | Status: DC

## 2019-09-10 NOTE — Patient Instructions (Addendum)
Pulmonary emphysema with fibrosis of lung (HCC) ILD (interstitial lung disease) ([place]) Hiatal hernia Cough Wheezing COPD with acute exacerbation (HCC)  - not fullu sure what is going on  - wheeze seems upper airway related  Plan  - continue night 2L [place]  - get info from CMA on covid vaccine  - do blood work - cbc, bmet, lft, BNP - check d-dimer tp see if eliquis can be stopped  - Take prednisone 40 mg daily x 2 days, then 20mg  daily x 2 days, then 10mg  daily x 2 days, then 5mg  daily x 2 days and stop - Take doxycycline 100mg  po twice daily x 5 days; take after meals and avoid sunlight - Continue on Brovana, Yupelri and Hypertonic nebs  - Continue on oxygen at bedtime -  do HRCT supine and prone when possible  Next few weeks  - refer home palliative care  - might have to evaluate swallowing or acid reflux given the hiatal hernia if problems keep recurring   Followup  - next few weeks to discuss with results -

## 2019-09-10 NOTE — Addendum Note (Signed)
Addended by: Desmond Dike C on: 09/10/2019 10:57 AM   Modules accepted: Orders
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
## 2019-09-10 NOTE — Addendum Note (Signed)
Addended by: Suzzanne Cloud E on: 09/10/2019 11:00 AM   Modules accepted: Orders

## 2019-09-10 NOTE — Addendum Note (Signed)
Addended by: Desmond Dike C on: 09/10/2019 11:02 AM   Modules accepted: Orders

## 2019-09-10 NOTE — Addendum Note (Signed)
Addended by: Suzzanne Cloud E on: 09/10/2019 11:02 AM   Modules accepted: Orders

## 2019-09-12 ENCOUNTER — Ambulatory Visit (INDEPENDENT_AMBULATORY_CARE_PROVIDER_SITE_OTHER)
Admission: RE | Admit: 2019-09-12 | Discharge: 2019-09-12 | Disposition: A | Discharge status: Home / Self Care | Payer: Medicare Other | Source: Ambulatory Visit | Attending: Internal Medicine | Admitting: Internal Medicine

## 2019-09-12 ENCOUNTER — Other Ambulatory Visit: Payer: Self-pay

## 2019-09-12 DIAGNOSIS — J849 Interstitial pulmonary disease, unspecified: Secondary | ICD-10-CM | POA: Diagnosis not present

## 2019-09-15 ENCOUNTER — Other Ambulatory Visit: Payer: Self-pay | Admitting: Internal Medicine

## 2019-09-16 ENCOUNTER — Other Ambulatory Visit: Payer: Self-pay | Admitting: Internal Medicine

## 2019-09-20 ENCOUNTER — Other Ambulatory Visit: Payer: Self-pay | Admitting: Internal Medicine

## 2019-09-24 ENCOUNTER — Encounter (HOSPITAL_COMMUNITY): Payer: Self-pay | Admitting: Emergency Medicine

## 2019-09-24 ENCOUNTER — Inpatient Hospital Stay (HOSPITAL_COMMUNITY)
Admission: EM | Admit: 2019-09-24 | Discharge: 2019-09-28 | DRG: 190 | Disposition: A | Discharge status: Hospice | Payer: Medicare Other | Attending: Internal Medicine | Admitting: Internal Medicine

## 2019-09-24 ENCOUNTER — Emergency Department (HOSPITAL_COMMUNITY): Payer: Medicare Other

## 2019-09-24 DIAGNOSIS — Z87440 Personal history of urinary (tract) infections: Secondary | ICD-10-CM | POA: Diagnosis not present

## 2019-09-24 DIAGNOSIS — Z9981 Dependence on supplemental oxygen: Secondary | ICD-10-CM | POA: Diagnosis not present

## 2019-09-24 DIAGNOSIS — R627 Adult failure to thrive: Secondary | ICD-10-CM | POA: Diagnosis present

## 2019-09-24 DIAGNOSIS — Z7902 Long term (current) use of antithrombotics/antiplatelets: Secondary | ICD-10-CM

## 2019-09-24 DIAGNOSIS — J441 Chronic obstructive pulmonary disease with (acute) exacerbation: Secondary | ICD-10-CM

## 2019-09-24 DIAGNOSIS — Z7901 Long term (current) use of anticoagulants: Secondary | ICD-10-CM | POA: Diagnosis not present

## 2019-09-24 DIAGNOSIS — Z515 Encounter for palliative care: Secondary | ICD-10-CM

## 2019-09-24 DIAGNOSIS — L8990 Pressure ulcer of unspecified site, unspecified stage: Secondary | ICD-10-CM | POA: Insufficient documentation

## 2019-09-24 DIAGNOSIS — Z87891 Personal history of nicotine dependence: Secondary | ICD-10-CM

## 2019-09-24 DIAGNOSIS — K219 Gastro-esophageal reflux disease without esophagitis: Secondary | ICD-10-CM | POA: Diagnosis present

## 2019-09-24 DIAGNOSIS — I69320 Aphasia following cerebral infarction: Secondary | ICD-10-CM | POA: Diagnosis not present

## 2019-09-24 DIAGNOSIS — Z20822 Contact with and (suspected) exposure to covid-19: Secondary | ICD-10-CM | POA: Diagnosis present

## 2019-09-24 DIAGNOSIS — E785 Hyperlipidemia, unspecified: Secondary | ICD-10-CM | POA: Diagnosis present

## 2019-09-24 DIAGNOSIS — Z8701 Personal history of pneumonia (recurrent): Secondary | ICD-10-CM | POA: Diagnosis not present

## 2019-09-24 DIAGNOSIS — J432 Centrilobular emphysema: Principal | ICD-10-CM | POA: Diagnosis present

## 2019-09-24 DIAGNOSIS — I5022 Chronic systolic (congestive) heart failure: Secondary | ICD-10-CM | POA: Diagnosis present

## 2019-09-24 DIAGNOSIS — J8410 Pulmonary fibrosis, unspecified: Secondary | ICD-10-CM | POA: Diagnosis present

## 2019-09-24 DIAGNOSIS — L89312 Pressure ulcer of right buttock, stage 2: Secondary | ICD-10-CM | POA: Diagnosis present

## 2019-09-24 DIAGNOSIS — I110 Hypertensive heart disease with heart failure: Secondary | ICD-10-CM | POA: Diagnosis present

## 2019-09-24 DIAGNOSIS — Z7189 Other specified counseling: Secondary | ICD-10-CM | POA: Diagnosis not present

## 2019-09-24 DIAGNOSIS — I429 Cardiomyopathy, unspecified: Secondary | ICD-10-CM | POA: Diagnosis present

## 2019-09-24 DIAGNOSIS — R0603 Acute respiratory distress: Secondary | ICD-10-CM | POA: Diagnosis not present

## 2019-09-24 DIAGNOSIS — Z86711 Personal history of pulmonary embolism: Secondary | ICD-10-CM | POA: Diagnosis not present

## 2019-09-24 DIAGNOSIS — Z8249 Family history of ischemic heart disease and other diseases of the circulatory system: Secondary | ICD-10-CM

## 2019-09-24 DIAGNOSIS — Z79899 Other long term (current) drug therapy: Secondary | ICD-10-CM | POA: Diagnosis not present

## 2019-09-24 DIAGNOSIS — J189 Pneumonia, unspecified organism: Secondary | ICD-10-CM | POA: Diagnosis present

## 2019-09-24 DIAGNOSIS — Z823 Family history of stroke: Secondary | ICD-10-CM

## 2019-09-24 DIAGNOSIS — J9621 Acute and chronic respiratory failure with hypoxia: Secondary | ICD-10-CM | POA: Diagnosis present

## 2019-09-24 DIAGNOSIS — Z91048 Other nonmedicinal substance allergy status: Secondary | ICD-10-CM

## 2019-09-24 DIAGNOSIS — I739 Peripheral vascular disease, unspecified: Secondary | ICD-10-CM | POA: Diagnosis present

## 2019-09-24 DIAGNOSIS — Z881 Allergy status to other antibiotic agents status: Secondary | ICD-10-CM

## 2019-09-24 DIAGNOSIS — R0902 Hypoxemia: Secondary | ICD-10-CM | POA: Diagnosis not present

## 2019-09-24 DIAGNOSIS — Z85828 Personal history of other malignant neoplasm of skin: Secondary | ICD-10-CM

## 2019-09-24 LAB — COMPREHENSIVE METABOLIC PANEL
ALT: 25 U/L (ref 0–44)
AST: 23 U/L (ref 15–41)
Albumin: 3.1 g/dL — ABNORMAL LOW (ref 3.5–5.0)
Alkaline Phosphatase: 58 U/L (ref 38–126)
Anion gap: 9 (ref 5–15)
BUN: 16 mg/dL (ref 8–23)
CO2: 23 mmol/L (ref 22–32)
Calcium: 9.1 mg/dL (ref 8.9–10.3)
Chloride: 108 mmol/L (ref 98–111)
Creatinine, Ser: 1.05 mg/dL (ref 0.61–1.24)
GFR calc Af Amer: >60 mL/min (ref >60)
GFR calc non Af Amer: >60 mL/min (ref >60)
Glucose, Bld: 96 mg/dL (ref 70–99)
Potassium: 4.2 mmol/L (ref 3.5–5.1)
Sodium: 140 mmol/L (ref 135–145)
Total Bilirubin: 0.6 mg/dL (ref 0.3–1.2)
Total Protein: 6.5 g/dL (ref 6.5–8.1)

## 2019-09-24 LAB — URINALYSIS, ROUTINE W REFLEX MICROSCOPIC
Bilirubin Urine: NEGATIVE
Glucose, UA: NEGATIVE mg/dL
Hgb urine dipstick: NEGATIVE
Ketones, ur: 5 mg/dL — AB
Nitrite: NEGATIVE
Protein, ur: NEGATIVE mg/dL
Specific Gravity, Urine: 1.009 (ref 1.005–1.030)
pH: 5 (ref 5.0–8.0)

## 2019-09-24 LAB — RESPIRATORY PANEL BY RT PCR (FLU A&B, COVID)
Influenza A by PCR: NEGATIVE
Influenza B by PCR: NEGATIVE
SARS Coronavirus 2 by RT PCR: NEGATIVE

## 2019-09-24 LAB — CBC WITH DIFFERENTIAL/PLATELET
Abs Immature Granulocytes: 0.07 10*3/uL (ref 0.00–0.07)
Basophils Absolute: 0.1 10*3/uL (ref 0.0–0.1)
Basophils Relative: 0 %
Eosinophils Absolute: 0.5 10*3/uL (ref 0.0–0.5)
Eosinophils Relative: 3 %
HCT: 43.8 % (ref 39.0–52.0)
Hemoglobin: 14.1 g/dL (ref 13.0–17.0)
Immature Granulocytes: 1 %
Lymphocytes Relative: 13 %
Lymphs Abs: 1.8 10*3/uL (ref 0.7–4.0)
MCH: 31.5 pg (ref 26.0–34.0)
MCHC: 32.2 g/dL (ref 30.0–36.0)
MCV: 98 fL (ref 80.0–100.0)
Monocytes Absolute: 1.4 10*3/uL — ABNORMAL HIGH (ref 0.1–1.0)
Monocytes Relative: 10 %
Neutro Abs: 10.3 10*3/uL — ABNORMAL HIGH (ref 1.7–7.7)
Neutrophils Relative %: 73 %
Platelets: 223 10*3/uL (ref 150–400)
RBC: 4.47 MIL/uL (ref 4.22–5.81)
RDW: 13.2 % (ref 11.5–15.5)
WBC: 14.1 10*3/uL — ABNORMAL HIGH (ref 4.0–10.5)
nRBC: 0 % (ref 0.0–0.2)

## 2019-09-24 LAB — TROPONIN I (HIGH SENSITIVITY)
Troponin I (High Sensitivity): 6 ng/L (ref <18)
Troponin I (High Sensitivity): 6 ng/L (ref <18)

## 2019-09-24 LAB — LACTIC ACID, PLASMA
Lactic Acid, Venous: 1.6 mmol/L (ref 0.5–1.9)
Lactic Acid, Venous: 1 mmol/L (ref 0.5–1.9)

## 2019-09-24 LAB — BRAIN NATRIURETIC PEPTIDE: B Natriuretic Peptide: 71.3 pg/mL (ref 0.0–100.0)

## 2019-09-24 LAB — LIPASE, BLOOD: Lipase: 28 U/L (ref 11–51)

## 2019-09-24 MED ORDER — BENZONATATE 100 MG PO CAPS: 100.0000 mg | ORAL_CAPSULE | Freq: Two times a day (BID) | ORAL | Status: DC | PRN

## 2019-09-24 MED ORDER — CARVEDILOL 12.5 MG PO TABS
12.5000 mg | ORAL_TABLET | Freq: Two times a day (BID) | ORAL | Status: DC
  Administered 2019-09-24 – 2019-09-27 (×6): 12.5 mg via ORAL
  Filled 2019-09-24 (×6): qty 1

## 2019-09-24 MED ORDER — SODIUM CHLORIDE 0.9 % IV SOLN
1.0000 g | INTRAVENOUS | Status: DC
  Administered 2019-09-24 – 2019-09-27 (×4): 1 g via INTRAVENOUS
  Filled 2019-09-24: qty 10
  Filled 2019-09-24 (×3): qty 1
  Filled 2019-09-24: qty 10

## 2019-09-24 MED ORDER — ALBUTEROL SULFATE HFA 108 (90 BASE) MCG/ACT IN AERS
2.0000 | INHALATION_SPRAY | Freq: Once | RESPIRATORY_TRACT | Status: AC
  Administered 2019-09-24: 2 via RESPIRATORY_TRACT
  Filled 2019-09-24: qty 6.7

## 2019-09-24 MED ORDER — EZETIMIBE 10 MG PO TABS
10.0000 mg | ORAL_TABLET | Freq: Every day | ORAL | Status: DC
  Administered 2019-09-24 – 2019-09-28 (×5): 10 mg via ORAL
  Filled 2019-09-24 (×5): qty 1

## 2019-09-24 MED ORDER — APIXABAN 2.5 MG PO TABS
2.5000 mg | ORAL_TABLET | Freq: Two times a day (BID) | ORAL | Status: DC
  Administered 2019-09-24 – 2019-09-28 (×8): 2.5 mg via ORAL
  Filled 2019-09-24 (×8): qty 1

## 2019-09-24 MED ORDER — MAGNESIUM SULFATE 2 GM/50ML IV SOLN
2.0000 g | Freq: Once | INTRAVENOUS | Status: AC
  Administered 2019-09-24: 11:00:00 2 g via INTRAVENOUS
  Filled 2019-09-24: qty 50

## 2019-09-24 MED ORDER — POTASSIUM CHLORIDE CRYS ER 10 MEQ PO TBCR
10.0000 meq | EXTENDED_RELEASE_TABLET | Freq: Every day | ORAL | Status: DC
  Administered 2019-09-24 – 2019-09-27 (×4): 10 meq via ORAL
  Filled 2019-09-24 (×4): qty 1

## 2019-09-24 MED ORDER — IPRATROPIUM BROMIDE HFA 17 MCG/ACT IN AERS
2.0000 | INHALATION_SPRAY | Freq: Once | RESPIRATORY_TRACT | Status: AC
  Administered 2019-09-24: 2 via RESPIRATORY_TRACT
  Filled 2019-09-24: qty 12.9

## 2019-09-24 MED ORDER — METHYLPREDNISOLONE SODIUM SUCC 40 MG IJ SOLR
40.0000 mg | Freq: Two times a day (BID) | INTRAMUSCULAR | Status: DC
  Administered 2019-09-24 – 2019-09-28 (×8): 40 mg via INTRAVENOUS
  Filled 2019-09-24 (×8): qty 1

## 2019-09-24 MED ORDER — SACCHAROMYCES BOULARDII 250 MG PO CAPS
250.0000 mg | ORAL_CAPSULE | Freq: Every day | ORAL | Status: DC
  Administered 2019-09-24 – 2019-09-28 (×5): 250 mg via ORAL
  Filled 2019-09-24 (×5): qty 1

## 2019-09-24 MED ORDER — ARFORMOTEROL TARTRATE 15 MCG/2ML IN NEBU
15.0000 ug | INHALATION_SOLUTION | Freq: Two times a day (BID) | RESPIRATORY_TRACT | Status: DC
  Administered 2019-09-24 – 2019-09-28 (×8): 15 ug via RESPIRATORY_TRACT
  Filled 2019-09-24 (×8): qty 2

## 2019-09-24 MED ORDER — PRAVASTATIN SODIUM 40 MG PO TABS
40.0000 mg | ORAL_TABLET | Freq: Every day | ORAL | Status: DC
  Administered 2019-09-24 – 2019-09-27 (×4): 40 mg via ORAL
  Filled 2019-09-24 (×4): qty 1

## 2019-09-24 MED ORDER — ENSURE ENLIVE PO LIQD
237.0000 mL | Freq: Every day | ORAL | Status: DC
  Administered 2019-09-24 – 2019-09-27 (×3): 237 mL via ORAL

## 2019-09-24 MED ORDER — ALBUTEROL SULFATE HFA 108 (90 BASE) MCG/ACT IN AERS: 2.0000 | INHALATION_SPRAY | Freq: Four times a day (QID) | RESPIRATORY_TRACT | Status: DC | PRN

## 2019-09-24 MED ORDER — CLOPIDOGREL BISULFATE 75 MG PO TABS
75.0000 mg | ORAL_TABLET | Freq: Every day | ORAL | Status: DC
  Administered 2019-09-24 – 2019-09-28 (×5): 75 mg via ORAL
  Filled 2019-09-24 (×5): qty 1

## 2019-09-24 MED ORDER — NITROGLYCERIN 0.4 MG SL SUBL: 0.4000 mg | SUBLINGUAL_TABLET | SUBLINGUAL | Status: DC | PRN

## 2019-09-24 MED ORDER — PANTOPRAZOLE SODIUM 40 MG PO TBEC
40.0000 mg | DELAYED_RELEASE_TABLET | Freq: Every day | ORAL | Status: DC
  Administered 2019-09-25 – 2019-09-28 (×4): 40 mg via ORAL
  Filled 2019-09-24 (×4): qty 1

## 2019-09-24 MED ORDER — REVEFENACIN 175 MCG/3ML IN SOLN: 1.0000 | Freq: Every day | RESPIRATORY_TRACT | Status: DC

## 2019-09-24 MED ORDER — GUAIFENESIN ER 600 MG PO TB12
1200.0000 mg | ORAL_TABLET | Freq: Two times a day (BID) | ORAL | Status: DC
  Administered 2019-09-24 – 2019-09-28 (×8): 1200 mg via ORAL
  Filled 2019-09-24 (×9): qty 2

## 2019-09-24 MED ORDER — AEROCHAMBER PLUS FLO-VU LARGE MISC
Status: AC
  Filled 2019-09-24: qty 1

## 2019-09-24 MED ORDER — ALBUTEROL SULFATE (2.5 MG/3ML) 0.083% IN NEBU: 2.5000 mg | INHALATION_SOLUTION | Freq: Four times a day (QID) | RESPIRATORY_TRACT | Status: DC | PRN

## 2019-09-24 MED ORDER — FUROSEMIDE 20 MG PO TABS
20.0000 mg | ORAL_TABLET | ORAL | Status: DC
  Administered 2019-09-25: 20 mg via ORAL
  Filled 2019-09-24 (×2): qty 1

## 2019-09-24 MED ORDER — ALBUTEROL SULFATE (2.5 MG/3ML) 0.083% IN NEBU
2.5000 mg | INHALATION_SOLUTION | Freq: Two times a day (BID) | RESPIRATORY_TRACT | Status: DC
  Administered 2019-09-24 – 2019-09-28 (×8): 2.5 mg via RESPIRATORY_TRACT
  Filled 2019-09-24 (×8): qty 3

## 2019-09-24 MED ORDER — IPRATROPIUM BROMIDE 0.02 % IN SOLN: 0.5000 mg | Freq: Four times a day (QID) | RESPIRATORY_TRACT | Status: DC | PRN

## 2019-09-24 MED ORDER — ADULT MULTIVITAMIN W/MINERALS CH
1.0000 | ORAL_TABLET | Freq: Every day | ORAL | Status: DC
  Administered 2019-09-24 – 2019-09-28 (×5): 1 via ORAL
  Filled 2019-09-24 (×6): qty 1

## 2019-09-24 MED ORDER — LOSARTAN POTASSIUM 50 MG PO TABS
50.0000 mg | ORAL_TABLET | Freq: Every evening | ORAL | Status: DC
  Administered 2019-09-24 – 2019-09-26 (×3): 50 mg via ORAL
  Filled 2019-09-24 (×3): qty 1

## 2019-09-24 MED ORDER — DOXYCYCLINE HYCLATE 100 MG PO TABS
100.0000 mg | ORAL_TABLET | Freq: Two times a day (BID) | ORAL | Status: DC
  Administered 2019-09-24 – 2019-09-25 (×3): 100 mg via ORAL
  Filled 2019-09-24 (×3): qty 1

## 2019-09-24 MED ORDER — VITAMIN D 25 MCG (1000 UNIT) PO TABS
1000.0000 [IU] | ORAL_TABLET | Freq: Every day | ORAL | Status: DC
  Administered 2019-09-24 – 2019-09-27 (×4): 1000 [IU] via ORAL
  Filled 2019-09-24 (×4): qty 1

## 2019-09-24 MED ORDER — METHYLPREDNISOLONE SODIUM SUCC 125 MG IJ SOLR
125.0000 mg | Freq: Once | INTRAMUSCULAR | Status: AC
  Administered 2019-09-24: 10:00:00 125 mg via INTRAVENOUS
  Filled 2019-09-24: qty 2

## 2019-09-24 NOTE — ED Triage Notes (Signed)
Per EMS- pt normally 95% on room air at COPD baseline. Pt has been having COPd exacerbation, 70% on room air.

## 2019-09-24 NOTE — ED Provider Notes (Addendum)
[facility] EMERGENCY DEPARTMENT Provider Note   CSN: [id number] Arrival date & time: 09/24/19  1013     History No chief complaint on file.   Lee [name] is a 86 y.o. male.  The history is provided by the patient, the EMS personnel and medical records. No language interpreter was used.  Shortness of Breath Severity:  Severe Onset quality:  Gradual Timing:  Constant Progression:  Worsening Chronicity:  Recurrent Relieved by:  Nothing Worsened by:  Nothing Ineffective treatments:  None tried Associated symptoms: wheezing   Associated symptoms: no abdominal pain, no chest pain, no cough, no diaphoresis, no fever, no headaches and no vomiting        Past Medical History:  Diagnosis Date  . Allergic rhinitis   . Anxiety   . Basal cell carcinoma of face   . Cardiomyopathy ([place]) 04/2015   EF 40-45% by echo. Apical anterior akinesis seen on echo, not confirmed on Myoview was negative for ischemia. Myoview suggested a small basal inferoseptal defect.  . Cholecystitis    a. complex admit 04/2015 due to sepsis due to acute Escherichia coli cholecystitis and right lower lobe CAP and Citrobacter UTI.  [name] Chronic respiratory failure ([place])    a. on home O2 after discharge 04/2015.  [name] Chronic systolic CHF (congestive heart failure) ([place])    a. Dx 04/2015 during complex admission for cholecystitis - EF 40-45% +WMA.  [name] COPD (chronic obstructive pulmonary disease) ([place])   . Erectile dysfunction   . History of endovascular stent graft for abdominal aortic aneurysm 2002   a. s/p repair  Followed by Dr. Donnetta Hutching (stable evaluation 01/2106)  . History of hiatal hernia   . HTN (hypertension)   . Hyperlipidemia   . Interstitial lung disease ([place])    a. followed by pulm.  [name] LBBB (left bundle branch block)    a. Intermittent - seen in 2013, not in 09/2013, but again in 04/2015.  . Obesity   . On home oxygen therapy    "just at night" (10/05/2015)  . PVD (peripheral vascular  disease) ([address])   . Shingles   . Stroke [facility])    a. H/o aphasia - was told he'd had ministrokes. Imaging 09/2014 revealed prior infarct.    Patient Active Problem List   Diagnosis Date Noted  . Acute on chronic diastolic heart failure ([place]) 07/29/2019  . Coughing up blood 01/11/2019  . Physical deconditioning 08/14/2018  . COPD exacerbation ([place]) 08/13/2018  . History of recent pneumonia 07/10/2018  . Pulmonary emboli ([place]) 06/15/2018  . Aspiration pneumonia ([place]) 06/15/2018  . Altered mental status 06/15/2018  . Chest pain 05/30/2017  . Stroke ([place])   . UTI (urinary tract infection) 05/02/2015  . H/o Nonischemic CM -- EF ~50% on f/u studies (~ LBBB related) 05/02/2015  . Sepsis ([place]) 04/30/2015  . Essential hypertension 04/30/2015  . Abdominal pain 04/30/2015  . Hyperlipidemia with target LDL less than 100 04/30/2015  . CAP (community acquired pneumonia)   . IPF (idiopathic pulmonary fibrosis) ([place]) 10/22/2014  . Cough 10/22/2014  . GERD (gastroesophageal reflux disease) 02/05/2013  . Other emphysema ([place]) 10/23/2012  . Idiopathic pulmonary fibrosis, high clinical pretest probability, declined surgical lung biopsy 09/13/2012  . Dyspnea on exertion 08/03/2012  . Hypercholesterolemia 08/03/2012  . Hypertensive heart disease without CHF (mild HFpEF) 08/03/2012  . H/O abdominal aortic aneurysm repair 08/03/2012  . Ventral hernia 08/03/2012    Past Surgical History:  Procedure Laterality Date  . AORTA SURGERY  2002  . CHOLECYSTECTOMY N/A 10/05/2015   Procedure: LAPAROSCOPIC CHOLECYSTECTOMY  ;  Surgeon: Autumn Messing III, MD;  Location: [facility];  Service: General;  Laterality: N/A;  . [place]  . INTRAOPERATIVE CHOLANGIOGRAM  10/05/2015   Procedure: INTRAOPERATIVE CHOLANGIOGRAM;  Surgeon: Autumn Messing III, MD;  Location: [facility];  Service: General;;  . LAPAROSCOPIC CHOLECYSTECTOMY  10/05/2015  . NM MYOVIEW LTD  05/2015   EF 49%. Small size, mild severity perfusion defect in the  basal-mid inferoseptal wall. LOW RISK. No ischemia. Defect thought to be related to LBBB artifacts and not true lesion. This could also explain mild systolic dysfunction.  . TRANSTHORACIC ECHOCARDIOGRAM  04/2015   (In setting of acute illness).EF 40-45% with mid-apical anteroseptal akinesis. Moderately increased PA pressures of roughly 48 mmHg.  [name] URETHRAL STRICTURE DILATATION  late 1970s   urinary tract stretch       Family History  Problem Relation Age of Onset  . Hypertension Mother   . Stroke Father   . Heart disease Father   . Colon cancer Brother     Social History   Tobacco Use  . Smoking status: Former Smoker    Packs/day: 1.00    Years: 25.00    Pack years: 25.00    Types: Cigarettes    Start date: 01/11/1951    Quit date: 08/22/1974    Years since quitting: 45.1  . Smokeless tobacco: Never Used  Substance Use Topics  . Alcohol use: No  . Drug use: No    Home Medications Prior to Admission medications   Medication Sig Start Date End Date Taking? Authorizing Provider  albuterol (PROVENTIL HFA;VENTOLIN HFA) 108 (90 Base) MCG/ACT inhaler Inhale 2 puffs into the lungs every 6 (six) hours as needed for wheezing or shortness of breath. 07/13/18   Hennie Duos, MD  albuterol (PROVENTIL) (2.5 MG/3ML) 0.083% nebulizer solution Take 3 mLs (2.5 mg total) by nebulization 2 (two) times daily. 09/10/19   Brand Males, MD  apixaban (ELIQUIS) 5 MG TABS tablet Take 1 tablet (5 mg total) by mouth 2 (two) times daily. Patient taking differently: Take 5 mg by mouth 2 (two) times daily with a meal.  07/13/18   Hennie Duos, MD  arformoterol (BROVANA) 15 MCG/2ML NEBU Take 2 mLs (15 mcg total) by nebulization 2 (two) times daily. 03/11/19   Lauraine Rinne, NP  benzonatate (TESSALON) 100 MG capsule Take 1 capsule (100 mg total) by mouth every 12 (twelve) hours as needed for cough. 08/28/19   Magdalen Spatz, NP  carvedilol (COREG) 12.5 MG tablet TAKE 1 TABLET (12.5 MG TOTAL) BY  MOUTH 2 (TWO) TIMES DAILY WITH A MEAL. Patient taking differently: Take 12.5 mg by mouth 2 (two) times daily with a meal.  07/13/18   Hennie Duos, MD  cholecalciferol (VITAMIN D) 1000 units tablet Take 1 tablet (1,000 Units total) by mouth daily. Patient taking differently: Take 1,000 Units by mouth daily with supper.  07/13/18   Hennie Duos, MD  clopidogrel (PLAVIX) 75 MG tablet Take 1 tablet (75 mg total) by mouth daily. Patient taking differently: Take 75 mg by mouth daily at 3 pm.  07/13/18   Hennie Duos, MD  doxycycline (VIBRA-TABS) 100 MG tablet Take 1 tablet (100 mg total) by mouth 2 (two) times daily. 09/10/19   Brand Males, MD  ezetimibe (ZETIA) 10 MG tablet Take 1 tablet (10 mg total) by mouth daily. 07/13/18   Hennie Duos, MD  feeding supplement, ENSURE ENLIVE, (ENSURE ENLIVE) LIQD Take 237 mLs by mouth 2 (two) times daily between meals. Patient taking differently: Take 237 mLs by mouth daily at 3 pm.  07/13/18   Hennie Duos, MD  furosemide (LASIX) 20 MG tablet Take 1 tablet (20 mg total) by mouth every Monday, Wednesday, and Friday. 07/13/18   Hennie Duos, MD  losartan (COZAAR) 50 MG tablet Take 1 tablet (50 mg total) by mouth daily. 07/13/18   Hennie Duos, MD  Multiple Vitamin (MULTIVITAMIN WITH MINERALS) TABS tablet Take 1 tablet by mouth daily. 07/13/18   Hennie Duos, MD  nitroGLYCERIN (NITROSTAT) 0.4 MG SL tablet Place 1 tablet (0.4 mg total) under the tongue every 5 (five) minutes as needed for chest pain. 07/13/18   Hennie Duos, MD  pantoprazole (PROTONIX) 40 MG tablet Take 1 tablet (40 mg total) by mouth daily. Patient taking differently: Take 40 mg by mouth daily before breakfast.  07/13/18   Hennie Duos, MD  Pitavastatin Calcium (LIVALO) 2 MG TABS Take 1 tablet (2 mg total) by mouth See admin instructions. Every 3rd day Patient taking differently: Take 1 mg by mouth every 3 (three) days.  07/13/18   Hennie Duos, MD  potassium chloride (MICRO-K) 10 MEQ CR capsule Take 1 capsule (10 mEq total) by mouth every Monday, Wednesday, and Friday. **Take with lasix** 07/13/18   Hennie Duos, MD  predniSONE (DELTASONE) 10 MG tablet Take 4 tablets for 2 days, 2 tablets for 2 days, 1 tablet for 2 days, 0.5 tablet for 2 days then stop 09/10/19   Brand Males, MD  Probiotic Product ([place]) CAPS Take 1 capsule by mouth daily. 07/13/18   Hennie Duos, MD  Respiratory Therapy Supplies (FLUTTER) DEVI Use as directed 08/28/19   Magdalen Spatz, NP  Revefenacin (YUPELRI) 175 MCG/3ML SOLN Inhale 1 vial into the lungs daily. 01/11/19   Lauraine Rinne, NP  sodium chloride HYPERTONIC 3 % nebulizer solution Once daily in the morning 04/24/19   Brand Males, MD  YUPELRI 175 MCG/3ML SOLN USE 1 VIAL IN NEBULIZER DAILY 02/26/19   Brand Males, MD    Allergies    Ciprofloxacin and Tape  Review of Systems   Review of Systems  Unable to perform ROS: Severe respiratory distress  Constitutional: Negative for chills, diaphoresis, fatigue and fever.  HENT: Negative for congestion.   Respiratory: Positive for chest tightness, shortness of breath and wheezing. Negative for cough and stridor.   Cardiovascular: Negative for chest pain, palpitations and leg swelling.  Gastrointestinal: Negative for abdominal pain, constipation, diarrhea, nausea and vomiting.  Genitourinary: Negative for dysuria.  Musculoskeletal: Negative for back pain.  Neurological: Negative for light-headedness and headaches.  Psychiatric/Behavioral: Negative for agitation.  All other systems reviewed and are negative.   Physical Exam Updated Vital Signs There were no vitals taken for this visit.  Physical Exam Vitals and nursing note reviewed.  Constitutional:      Appearance: He is well-developed.  HENT:     Head: Normocephalic and atraumatic.     Nose: No congestion or rhinorrhea.     Mouth/Throat:     Mouth: Mucous  membranes are moist.     Pharynx: No oropharyngeal exudate.  Eyes:     Extraocular Movements: Extraocular movements intact.     Conjunctiva/sclera: Conjunctivae normal.     Pupils: Pupils are equal, round, and reactive to light.  Cardiovascular:     Rate and Rhythm:  Normal rate and regular rhythm.     Pulses: Normal pulses.     Heart sounds: No murmur.  Pulmonary:     Effort: Pulmonary effort is normal. No respiratory distress.     Breath sounds: Wheezing and rales present. No rhonchi.  Chest:     Chest wall: No tenderness.  Abdominal:     General: Abdomen is flat.     Palpations: Abdomen is soft.     Tenderness: There is no abdominal tenderness. There is no right CVA tenderness or left CVA tenderness.  Musculoskeletal:        General: No tenderness.     Cervical back: Neck supple. No tenderness.     Right lower leg: Edema present.     Left lower leg: Edema present.  Skin:    General: Skin is warm and dry.     Findings: No erythema or rash.  Neurological:     General: No focal deficit present.     Mental Status: He is alert.  Psychiatric:        Mood and Affect: Mood normal.     ED Results / Procedures / Treatments   Labs (all labs ordered are listed, but only abnormal results are displayed) Labs Reviewed  CBC WITH DIFFERENTIAL/PLATELET - Abnormal; Notable for the following components:      Result Value   WBC 14.1 (*)    Neutro Abs 10.3 (*)    Monocytes Absolute 1.4 (*)    All other components within normal limits  COMPREHENSIVE METABOLIC PANEL - Abnormal; Notable for the following components:   Albumin 3.1 (*)    All other components within normal limits  URINALYSIS, ROUTINE W REFLEX MICROSCOPIC - Abnormal; Notable for the following components:   Color, Urine STRAW (*)    Ketones, ur 5 (*)    Leukocytes,Ua SMALL (*)    Bacteria, UA RARE (*)    All other components within normal limits  RESPIRATORY PANEL BY RT PCR (FLU A&B, COVID)  CULTURE, BLOOD (ROUTINE X 2)    CULTURE, BLOOD (ROUTINE X 2)  EXPECTORATED SPUTUM ASSESSMENT W REFEX TO RESP CULTURE  LACTIC ACID, PLASMA  LACTIC ACID, PLASMA  LIPASE, BLOOD  BRAIN NATRIURETIC PEPTIDE  LEGIONELLA PNEUMOPHILA SEROGP 1 UR AG  MYCOPLASMA PNEUMONIAE ANTIBODY, IGM  TROPONIN I (HIGH SENSITIVITY)  TROPONIN I (HIGH SENSITIVITY)    EKG EKG Interpretation  Date/Time:  Tuesday September 24 2019 10:22:05 EST Ventricular Rate:  80 PR Interval:    QRS Duration: 129 QT Interval:  402 QTC Calculation: 464 R Axis:   -21 Text Interpretation: Sinus rhythm Left bundle branch block When compared to prior, more artifact. No STEMI Confirmed by Antony Blackbird [phone]) on 09/24/2019 12:35:20 PM   Radiology DG Chest Portable 1 View  Result Date: 09/24/2019 CLINICAL DATA:  Oxygen desaturation and patient with a history of COPD. EXAM: PORTABLE CHEST 1 VIEW COMPARISON:  PA and lateral chest 08/28/2019, 04/02/2019 and 08/24/2018. CT chest 09/12/2019. FINDINGS: The lungs are markedly emphysematous. There is bibasilar atelectasis. No consolidative process, pneumothorax or effusion. Heart size is normal. No acute or focal bony abnormality. IMPRESSION: Emphysema without acute disease. Electronically Signed   By: Inge Rise M.D.   On: 09/24/2019 11:11    Procedures Procedures (including critical care time)  Medications Ordered in ED Medications  cefTRIAXone (ROCEPHIN) 1 g in sodium chloride 0.9 % 100 mL IVPB (0 g Intravenous Stopped 09/24/19 1457)  doxycycline (VIBRA-TABS) tablet 100 mg (100 mg Oral Given 09/24/19 1422)  albuterol (VENTOLIN HFA) 108 (90 Base) MCG/ACT inhaler 2 puff (2 puffs Inhalation Given 09/24/19 1026)  ipratropium (ATROVENT HFA) inhaler 2 puff (2 puffs Inhalation Given 09/24/19 1026)  methylPREDNISolone sodium succinate (SOLU-MEDROL) 125 mg/2 mL injection 125 mg (125 mg Intravenous Given 09/24/19 1026)  magnesium sulfate IVPB 2 g 50 mL (0 g Intravenous Stopped 09/24/19 1129)  AeroChamber Plus Flo-Vu Large MISC (   Given 09/24/19 1048)    ED Course  I have reviewed the triage vital signs and the nursing notes.  Pertinent labs & imaging results that were available during my care of the patient were reviewed by me and considered in my medical decision making (see chart for details).    MDM Rules/Calculators/A&P                      Lonnell JMARION DELATTE is a 86 y.o. male with a past medical history significant for hypertension, hyperlipidemia, CHF, prior PE on Eliquis therapy, prior AAA status post repair, prior stroke, and COPD who presents with shortness of breath, wheezing, and hypoxia.  According to EMS, patient was found to oxygen saturations in the 70s on room air at home today.  Patient reports no significant fevers, chills, chest pain, nausea, vomiting, or other symptoms.  He denies a cough but at the PCP today was found to have pneumonia on chest x-ray.  Patient was transferred for further evaluation.  Patient reports only having shortness of breath.  He denies other complaints.  He specifically denies any chest pain or abdominal pain.  Denies recent injury.  On exam, lungs are coarse, significant for wheezing in all lung fields, and some rales.  Legs some compression stockings on but have palpable pulses.  Patient had normal strength in arms and could move legs.  Patient resting comfortably now that he is on a nonrebreather.  Abdomen has no tenderness but does have some palpable hernia with scar present.  Clinically I suspect COPD exacerbation in the setting of possible pneumonia given the x-ray report from PCP.  Unfortunately, we cannot see the x-ray report, will repeat portable chest x-ray.  We will give the patient albuterol, Atrovent, Solu-Medrol, and magnesium for the wheezing and severe hypoxia.  We will try to wean down the oxygen to nasal cannula from the nonrebreather.  Will get work-up including labs for cardiac or CHF exacerbation.  Due to the new hypoxia, anticipate admission.  Given lack of chest  pain, lower suspicion for acute pulmonary embolism.  EMS reports that PCPs rapid Covid test was negative however we will repeat with a rapid PCR test.  Patient will be admitted after work-up.  Work-up returned showing likely COPD exacerbation.  Covid test was negative.  Chest x-ray did not show pneumonia as well suspected by PCP.  Will hold on antibiotics at this time.  Patient was weaned to 5 L nasal cannula.  Due to his new oxygen requirement, he'll be admitted.  Patient admitted for further management.    Final Clinical Impression(s) / ED Diagnoses Final diagnoses:  Hypoxia  Respiratory distress  COPD exacerbation (HCC)     Clinical Impression: 1. Hypoxia   2. Respiratory distress   3. COPD exacerbation ([place])     Disposition: Admit  This note was prepared with assistance of Dragon voice recognition software. Occasional wrong-word or sound-a-like substitutions may have occurred due to the inherent limitations of voice recognition software.     Elener Custodio, Gwenyth Allegra, MD 09/24/19 1610    Remee Charley, Harrell Gave  J, MD 09/24/19 [id number]

## 2019-09-24 NOTE — Progress Notes (Signed)
Attempted to call Daughter and Spouse to give update on patient. Neither could be reached.

## 2019-09-24 NOTE — Plan of Care (Signed)
°  Problem: Respiratory: °Goal: Ability to maintain adequate ventilation will improve °Outcome: Progressing °  °

## 2019-09-24 NOTE — ED Notes (Signed)
Per CN called [facility] for help with transport.

## 2019-09-24 NOTE — ED Notes (Signed)
Family at bedside. 

## 2019-09-24 NOTE — H&P (Signed)
History and Physical    Lee [name] [id number] DOB: 12/21/1930 DOA: 09/24/2019  PCP: [name], MD   Patient coming from: Home  I have personally briefly reviewed patient's old medical records in [place]  Chief Complaint: Fever, SOB  HPI: Lee [name] is a 86 y.o. male with medical history significant of chronic hypoxic respiratory failure on home oxygen, COPD, chronic interstitial lung disease, cardiomyopathy, hypertension hyperlipidemia, presented with fever and increasing short of breath.  Patient was recently treated for COPD exacerbation about 1 week ago, when she was placed on 4 days of p.o. steroids and doxycycline by his pulmonologist.  He was feeling fine until yesterday, family noticed patient started to have increasing cough, short of breath and spiking fever 102 yesterday evening, and was given Tylenol.  Patient has chronic cough, most occasions were dry.  Denies any dysuria, no diarrhea. ED Course: Patient was found to have wheezing on physical exam, and leukocytosis on blood work, x-ray showed no significant infiltrates, and patient was found to have hypoxia 70s on room air.  After treatment of magnesium and steroid, improved to 95% on 2 L.  Review of Systems: As per HPI otherwise 10 point review of systems negative.    Past Medical History:  Diagnosis Date  . Allergic rhinitis   . Anxiety   . Basal cell carcinoma of face   . Cardiomyopathy ([place]) 04/2015   EF 40-45% by echo. Apical anterior akinesis seen on echo, not confirmed on Myoview was negative for ischemia. Myoview suggested a small basal inferoseptal defect.  . Cholecystitis    a. complex admit 04/2015 due to sepsis due to acute Escherichia coli cholecystitis and right lower lobe CAP and Citrobacter UTI.  [name] Chronic respiratory failure ([place])    a. on home O2 after discharge 04/2015.  [name] Chronic systolic CHF (congestive heart failure) ([place])    a. Dx 04/2015 during complex admission for cholecystitis -  EF 40-45% +WMA.  [name] COPD (chronic obstructive pulmonary disease) ([place])   . Erectile dysfunction   . History of endovascular stent graft for abdominal aortic aneurysm 2002   a. s/p repair  Followed by Dr. Donnetta Hutching (stable evaluation 01/2106)  . History of hiatal hernia   . HTN (hypertension)   . Hyperlipidemia   . Interstitial lung disease ([place])    a. followed by pulm.  [name] LBBB (left bundle branch block)    a. Intermittent - seen in 2013, not in 09/2013, but again in 04/2015.  . Obesity   . On home oxygen therapy    "just at night" (10/05/2015)  . PVD (peripheral vascular disease) ([place])   . Shingles   . Stroke [facility])    a. H/o aphasia - was told he'd had ministrokes. Imaging 09/2014 revealed prior infarct.    Past Surgical History:  Procedure Laterality Date  . AORTA SURGERY  2002  . CHOLECYSTECTOMY N/A 10/05/2015   Procedure: LAPAROSCOPIC CHOLECYSTECTOMY  ;  Surgeon: Autumn Messing III, MD;  Location: [place];  Service: General;  Laterality: N/A;  . [place]  . INTRAOPERATIVE CHOLANGIOGRAM  10/05/2015   Procedure: INTRAOPERATIVE CHOLANGIOGRAM;  Surgeon: Autumn Messing III, MD;  Location: [facility];  Service: General;;  . LAPAROSCOPIC CHOLECYSTECTOMY  10/05/2015  . NM MYOVIEW LTD  05/2015   EF 49%. Small size, mild severity perfusion defect in the basal-mid inferoseptal wall. LOW RISK. No ischemia. Defect thought to be related to LBBB artifacts and not true lesion. This could also explain  mild systolic dysfunction.  . TRANSTHORACIC ECHOCARDIOGRAM  04/2015   (In setting of acute illness).EF 40-45% with mid-apical anteroseptal akinesis. Moderately increased PA pressures of roughly 48 mmHg.  [name] URETHRAL STRICTURE DILATATION  late 1970s   urinary tract stretch     reports that he quit smoking about 45 years ago. His smoking use included cigarettes. He started smoking about 68 years ago. He has a 25.00 pack-year smoking history. He has never used smokeless tobacco. He reports that he does not drink  alcohol or use drugs.  Allergies  Allergen Reactions  . Ciprofloxacin Other (See Comments)    Tingle feeling throughout body  . Tape Other (See Comments)    Redness, Please use "paper" tape    Family History  Problem Relation Age of Onset  . Hypertension Mother   . Stroke Father   . Heart disease Father   . Colon cancer Brother      Prior to Admission medications   Medication Sig Start Date End Date Taking? Authorizing Provider  albuterol (PROVENTIL HFA;VENTOLIN HFA) 108 (90 Base) MCG/ACT inhaler Inhale 2 puffs into the lungs every 6 (six) hours as needed for wheezing or shortness of breath. 07/13/18  Yes Hennie Duos, MD  albuterol (PROVENTIL) (2.5 MG/3ML) 0.083% nebulizer solution Take 3 mLs (2.5 mg total) by nebulization 2 (two) times daily. 09/10/19  Yes Brand Males, MD  apixaban (ELIQUIS) 5 MG TABS tablet Take 1 tablet (5 mg total) by mouth 2 (two) times daily. Patient taking differently: Take 2.5 mg by mouth 2 (two) times daily with a meal.  07/13/18  Yes Hennie Duos, MD  arformoterol (BROVANA) 15 MCG/2ML NEBU Take 2 mLs (15 mcg total) by nebulization 2 (two) times daily. 03/11/19  Yes Lauraine Rinne, NP  benzonatate (TESSALON) 100 MG capsule Take 1 capsule (100 mg total) by mouth every 12 (twelve) hours as needed for cough. 08/28/19  Yes Magdalen Spatz, NP  carvedilol (COREG) 12.5 MG tablet TAKE 1 TABLET (12.5 MG TOTAL) BY MOUTH 2 (TWO) TIMES DAILY WITH A MEAL. Patient taking differently: Take 12.5 mg by mouth 2 (two) times daily with a meal.  07/13/18  Yes Hennie Duos, MD  cholecalciferol (VITAMIN D) 1000 units tablet Take 1 tablet (1,000 Units total) by mouth daily. Patient taking differently: Take 1,000 Units by mouth daily with supper.  07/13/18  Yes Hennie Duos, MD  clopidogrel (PLAVIX) 75 MG tablet Take 1 tablet (75 mg total) by mouth daily. Patient taking differently: Take 75 mg by mouth daily at 3 pm.  07/13/18  Yes Hennie Duos, MD    doxycycline (VIBRA-TABS) 100 MG tablet Take 1 tablet (100 mg total) by mouth 2 (two) times daily. 09/10/19  Yes Brand Males, MD  ezetimibe (ZETIA) 10 MG tablet Take 1 tablet (10 mg total) by mouth daily. 07/13/18  Yes Hennie Duos, MD  feeding supplement, ENSURE ENLIVE, (ENSURE ENLIVE) LIQD Take 237 mLs by mouth 2 (two) times daily between meals. Patient taking differently: Take 237 mLs by mouth daily at 3 pm.  07/13/18  Yes Hennie Duos, MD  furosemide (LASIX) 20 MG tablet Take 1 tablet (20 mg total) by mouth every Monday, Wednesday, and Friday. 07/13/18  Yes Hennie Duos, MD  guaiFENesin (MUCINEX PO) Take 1,200 mg by mouth 2 (two) times daily.   Yes [provider]  losartan (COZAAR) 50 MG tablet Take 1 tablet (50 mg total) by mouth daily. Patient taking differently: Take 50 mg  by mouth every evening.  07/13/18  Yes Hennie Duos, MD  Multiple Vitamin (MULTIVITAMIN WITH MINERALS) TABS tablet Take 1 tablet by mouth daily. 07/13/18  Yes Hennie Duos, MD  nitroGLYCERIN (NITROSTAT) 0.4 MG SL tablet Place 1 tablet (0.4 mg total) under the tongue every 5 (five) minutes as needed for chest pain. 07/13/18  Yes Hennie Duos, MD  pantoprazole (PROTONIX) 40 MG tablet Take 1 tablet (40 mg total) by mouth daily. Patient taking differently: Take 40 mg by mouth daily before breakfast.  07/13/18  Yes Hennie Duos, MD  Pitavastatin Calcium (LIVALO) 2 MG TABS Take 1 tablet (2 mg total) by mouth See admin instructions. Every 3rd day Patient taking differently: Take 1 mg by mouth every 3 (three) days.  07/13/18  Yes Hennie Duos, MD  potassium chloride (MICRO-K) 10 MEQ CR capsule Take 1 capsule (10 mEq total) by mouth every Monday, Wednesday, and Friday. **Take with lasix** 07/13/18  Yes Hennie Duos, MD  Probiotic Product ([place]) CAPS Take 1 capsule by mouth daily. 07/13/18  Yes Hennie Duos, MD  Revefenacin [facility]) 175 MCG/3ML SOLN  Inhale 1 vial into the lungs daily. 01/11/19  Yes Lauraine Rinne, NP  sodium chloride HYPERTONIC 3 % nebulizer solution Once daily in the morning 04/24/19  Yes Brand Males, MD  predniSONE (DELTASONE) 10 MG tablet Take 4 tablets for 2 days, 2 tablets for 2 days, 1 tablet for 2 days, 0.5 tablet for 2 days then stop Patient not taking: Reported on 09/24/2019 09/10/19   Brand Males, MD  Respiratory Therapy Supplies (FLUTTER) DEVI Use as directed 08/28/19   Magdalen Spatz, NP    Physical Exam: Vitals:   09/24/19 1201 09/24/19 1307 09/24/19 1340 09/24/19 1430  BP:    (!) 147/63  Pulse:  80 81 87  Resp:  (!) 21 (!) 21 (!) 25  Temp: (!) 97.5 F (36.4 C)     TempSrc: Rectal     SpO2:  99% 98% 100%    Constitutional: NAD, calm, comfortable Vitals:   09/24/19 1201 09/24/19 1307 09/24/19 1340 09/24/19 1430  BP:    (!) 147/63  Pulse:  80 81 87  Resp:  (!) 21 (!) 21 (!) 25  Temp: (!) 97.5 F (36.4 C)     TempSrc: Rectal     SpO2:  99% 98% 100%   Eyes: PERRL, lids and conjunctivae normal ENMT: Mucous membranes are moist. Posterior pharynx clear of any exudate or lesions.Normal dentition.  Neck: normal, supple, no masses, no thyromegaly Respiratory: Scattered wheezing no crackles.  Increased respiratory effort. No accessory muscle use.  Cardiovascular: Regular rate and rhythm, soft murmur on heart base. No extremity edema. 2+ pedal pulses. No carotid bruits.  Abdomen: no tenderness, no masses palpated. No hepatosplenomegaly. Bowel sounds positive.  Musculoskeletal: no clubbing / cyanosis. No joint deformity upper and lower extremities. Good ROM, no contractures. Normal muscle tone.  Skin: no rashes, lesions, ulcers. No induration Neurologic: CN 2-12 grossly intact. Sensation intact, DTR normal. Strength 5/5 in all 4.  Psychiatric: Normal judgment and insight. Alert and oriented x 3. Normal mood.     Labs on Admission: I have personally reviewed following labs and imaging  studies  CBC: Recent Labs  Lab 09/24/19 1044  WBC 14.1*  NEUTROABS 10.3*  HGB 14.1  HCT 43.8  MCV 98.0  PLT [id number]   Basic Metabolic Panel: Recent Labs  Lab 09/24/19 1044  NA 140  K 4.2  CL 108  CO2 23  GLUCOSE 96  BUN 16  CREATININE 1.05  CALCIUM 9.1   GFR: CrCl cannot be calculated (Unknown ideal weight.). Liver Function Tests: Recent Labs  Lab 09/24/19 1044  AST 23  ALT 25  ALKPHOS 58  BILITOT 0.6  PROT 6.5  ALBUMIN 3.1*   Recent Labs  Lab 09/24/19 1044  LIPASE 28   No results for input(s): AMMONIA in the last 168 hours. Coagulation Profile: No results for input(s): INR, PROTIME in the last 168 hours. Cardiac Enzymes: No results for input(s): CKTOTAL, CKMB, CKMBINDEX, TROPONINI in the last 168 hours. BNP (last 3 results) Recent Labs    04/03/19 1149 09/10/19 1101  PROBNP 317 92.0   HbA1C: No results for input(s): HGBA1C in the last 72 hours. CBG: No results for input(s): GLUCAP in the last 168 hours. Lipid Profile: No results for input(s): CHOL, HDL, LDLCALC, TRIG, CHOLHDL, LDLDIRECT in the last 72 hours. Thyroid Function Tests: No results for input(s): TSH, T4TOTAL, FREET4, T3FREE, THYROIDAB in the last 72 hours. Anemia Panel: No results for input(s): VITAMINB12, FOLATE, FERRITIN, TIBC, IRON, RETICCTPCT in the last 72 hours. Urine analysis:    Component Value Date/Time   COLORURINE STRAW (A) 09/24/2019 1400   APPEARANCEUR CLEAR 09/24/2019 1400   LABSPEC 1.009 09/24/2019 1400   PHURINE 5.0 09/24/2019 1400   GLUCOSEU NEGATIVE 09/24/2019 1400   HGBUR NEGATIVE 09/24/2019 1400   BILIRUBINUR NEGATIVE 09/24/2019 1400   KETONESUR 5 (A) 09/24/2019 1400   PROTEINUR NEGATIVE 09/24/2019 1400   UROBILINOGEN 1.0 04/30/2015 2123   NITRITE NEGATIVE 09/24/2019 1400   LEUKOCYTESUR SMALL (A) 09/24/2019 1400    Radiological Exams on Admission: DG Chest Portable 1 View  Result Date: 09/24/2019 CLINICAL DATA:  Oxygen desaturation and patient with a  history of COPD. EXAM: PORTABLE CHEST 1 VIEW COMPARISON:  PA and lateral chest 08/28/2019, 04/02/2019 and 08/24/2018. CT chest 09/12/2019. FINDINGS: The lungs are markedly emphysematous. There is bibasilar atelectasis. No consolidative process, pneumothorax or effusion. Heart size is normal. No acute or focal bony abnormality. IMPRESSION: Emphysema without acute disease. Electronically Signed   By: Inge Rise M.D.   On: 09/24/2019 11:11    EKG: Independently reviewed.   Assessment/Plan Active Problems:   Pneumonia  Probably atypical bacterial pneumonia Even the symptoms of fever, leukocytosis increased cough and hypoxia.  Despite lack of presentation on chest x-ray.  Ceftriaxone and doxycycline for now. Send atypical work-up including Legionella and mycoplasma, culture sputum if any. And cardiologist office visits also mentioned about risk of aspiration, will consult speech.  Acute on chronic hypoxic respite failure, as above, wean down oxygen to saturation 92 to 93%.  Acute COPD exacerbation secondary to pneumonia, add IV Solu-Medrol continue other breathing meds.  HTN, continue home meds.  PE diagnosed in 2019, on Eliquis.    DVT prophylaxis: Eliquis Code Status: Full code Family Communication: Patient's daughter over the phone Disposition Plan: Home once symptoms improved Consults called: None Admission status: Telemetry admission   Lequita Halt MD [facility] Pager [phone]    09/24/2019, 4:17 PM

## 2019-09-25 ENCOUNTER — Inpatient Hospital Stay (HOSPITAL_COMMUNITY): Payer: Medicare Other

## 2019-09-25 DIAGNOSIS — R0603 Acute respiratory distress: Secondary | ICD-10-CM

## 2019-09-25 DIAGNOSIS — L8990 Pressure ulcer of unspecified site, unspecified stage: Secondary | ICD-10-CM | POA: Insufficient documentation

## 2019-09-25 LAB — CBC
HCT: 39.6 % (ref 39.0–52.0)
Hemoglobin: 13.4 g/dL (ref 13.0–17.0)
MCH: 31.7 pg (ref 26.0–34.0)
MCHC: 33.8 g/dL (ref 30.0–36.0)
MCV: 93.6 fL (ref 80.0–100.0)
Platelets: 243 10*3/uL (ref 150–400)
RBC: 4.23 MIL/uL (ref 4.22–5.81)
RDW: 12.7 % (ref 11.5–15.5)
WBC: 14.1 10*3/uL — ABNORMAL HIGH (ref 4.0–10.5)
nRBC: 0 % (ref 0.0–0.2)

## 2019-09-25 LAB — LEGIONELLA PNEUMOPHILA SEROGP 1 UR AG: L. pneumophila Serogp 1 Ur Ag: NEGATIVE

## 2019-09-25 MED ORDER — SODIUM CHLORIDE 0.9 % IV SOLN
500.0000 mg | INTRAVENOUS | Status: DC
  Administered 2019-09-25 – 2019-09-27 (×3): 500 mg via INTRAVENOUS
  Filled 2019-09-25 (×4): qty 500

## 2019-09-25 MED ORDER — RESOURCE THICKENUP CLEAR PO POWD
ORAL | Status: DC | PRN
  Filled 2019-09-25 (×2): qty 125

## 2019-09-25 MED ORDER — ACETAMINOPHEN 325 MG PO TABS: 650.0000 mg | ORAL_TABLET | Freq: Four times a day (QID) | ORAL | Status: DC | PRN

## 2019-09-25 NOTE — Progress Notes (Addendum)
  Speech Language Pathology Patient Details Name: Lee [name] MRN: [id number] DOB: [birth year] Today's Date: 09/25/2019 Time: [id number] SLP Time Calculation (min) (ACUTE ONLY): 16 min   Pt's MBS was actually scheduled for tomorrow and was transported to radiology today in error. SLP was unable to accommodate doing today. MBS will be performed 2/4 (Thurs)   GO                [name] 09/25/2019, 12:34 PM  [name] [name].Ed Risk analyst [phone] Office [phone]

## 2019-09-25 NOTE — Progress Notes (Signed)
PROGRESS NOTE    Lee [name]  [id number] DOB: 1931-05-29 DOA: 09/24/2019 PCP: [name], MD   Brief Narrative:86 y.o. male with medical history significant of chronic hypoxic respiratory failure on home oxygen, COPD, chronic interstitial lung disease, cardiomyopathy, hypertension hyperlipidemia, presented with fever and increasing short of breath.  Patient was recently treated for COPD exacerbation about 1 week ago, when she was placed on 4 days of p.o. steroids and doxycycline by his pulmonologist.  He was feeling fine until yesterday, family noticed patient started to have increasing cough, short of breath and spiking fever 102 yesterday evening, and was given Tylenol.  Patient has chronic cough, most occasions were dry.  Denies any dysuria, no diarrhea. ED Course: Patient was found to have wheezing on physical exam, and leukocytosis on blood work, x-ray showed no significant infiltrates, and patient was found to have hypoxia 70s on room air.  After treatment of magnesium and steroid, improved to 95% on 2 L.  09/25/2019 patient seen at bedside with the nursing staff.  No events overnight.  Assessment & Plan:   Active Problems:   Pneumonia  #1 acute on chronic hypoxic respiratory failure secondary to COPD exacerbation and likely pneumonia in the setting of pulmonary fibrosis.  Patient presented with high fever cough shortness of breath and hypoxia.  His sats were 70s on room air. His chest x-ray really did not show any evidence of infiltrates. However his white count was elevated at 14.1. Covid negative influenza panel negative Lactic acid 1.6 Check procalcitonin. D-dimer 0.67 Continue Rocephin, Solu-Medrol, doxycycline.  Follow-up cultures.  So far cultures have been negative. Taper steroids as patient can tolerate. Continue Tessalon Perles, Brovana, Proventil. It does look like patient has had recurrent episodes of pneumonia and antibiotic treatment as recent as 2 weeks ago.  I  will consult palliative care.  Per his daughter he does walk some at home with a walker and assistance.  But he has grown progressively deconditioned and weaker. Patient followed by Dr. Lynford Citizen as an outpatient.  #2 essential hypertension on Cozaar 50 mg daily, Lasix 20 mg Monday Wednesday Friday, carvedilol 12.5 twice a day.  Blood pressure is 140/67.  #3 history of congestive heart failure he is on Lasix 20 mg Monday Wednesday and Friday.  I will hold his Lasix as he looks more dry to me.  He has no pedal edema or crackles by exam.  Will restart as needed.  #4 history of PE diagnosed in 2019 on Eliquis  #5 hyperlipidemia on statin.    Pressure Injury 09/24/19 Buttocks Right Stage 2 -  Partial thickness loss of dermis presenting as a shallow open injury with a red, pink wound bed without slough. (Active)  09/24/19 1800  Location: Buttocks  Location Orientation: Right  Staging: Stage 2 -  Partial thickness loss of dermis presenting as a shallow open injury with a red, pink wound bed without slough.  Wound Description (Comments):   Present on Admission: Yes    Estimated body mass index is 34.7 kg/m as calculated from the following:   Height as of 09/10/19: 5\' 6"  (1.676 m).   Weight as of 09/10/19: 97.5 kg.  DVT prophylaxis: Eliquis Code Status: Full code Family Communication: Discussed with his daughter Disposition Plan patient came from home, plan is to send him home with home health PT once physical therapy see him.  Family not interested in sending him to rehab.  They have caregivers coming into the house to take care of him  few times a week.  He is admitted with COPD exacerbation and likely pneumonia on antibiotics and steroids IV.  Barriers to discharge is persistent hypoxia patient is on 2 L of oxygen at home only at night.  He is now on 3 L of oxygen.  Titrate his oxygen to keep sats above 92%.  Once 6 hypoxia improves and he is fever free for at least 48 hours consider discharge  home with home health PT.  Consultants:   None  Procedures: None Antimicrobials: Rocephin and azithromycin  Subjective:  Sitting up in bed speech is somewhat slow and slurred which is chronic and at his baseline he is on 3 L of oxygen does not appear to be in any respiratory distress Objective: Vitals:   09/24/19 1938 09/24/19 2303 09/25/19 0850 09/25/19 0857  BP:  (!) 120/54  140/67  Pulse:  84  83  Resp:    20  Temp:  98.4 F (36.9 C)  97.9 F (36.6 C)  TempSrc:      SpO2: 97% 93% 96% 97%    Intake/Output Summary (Last 24 hours) at 09/25/2019 1149 Last data filed at 09/25/2019 [id number] Gross per 24 hour  Intake 120 ml  Output 300 ml  Net -180 ml   There were no vitals filed for this visit.  Examination:  General exam: Appears calm and comfortable  Respiratory system:   wheezing to auscultation. Respiratory effort normal. Cardiovascular system: S1 & S2 heard, RRR. No JVD, murmurs, rubs, gallops or clicks. No pedal edema. Gastrointestinal system: Abdomen is nondistended, soft and nontender. No organomegaly or masses felt. Normal bowel sounds heard. Central nervous system: Alert and oriented. No focal neurological deficits. Extremities: No edema  skin: No rashes, lesions or ulcers Psychiatry: Judgement and insight appear normal. Mood & affect appropriate.     Data Reviewed: I have personally reviewed following labs and imaging studies  CBC: Recent Labs  Lab 09/24/19 1044 09/25/19 0252  WBC 14.1* 14.1*  NEUTROABS 10.3*  --   HGB 14.1 13.4  HCT 43.8 39.6  MCV 98.0 93.6  PLT 223 0000000   Basic Metabolic Panel: Recent Labs  Lab 09/24/19 1044  NA 140  K 4.2  CL 108  CO2 23  GLUCOSE 96  BUN 16  CREATININE 1.05  CALCIUM 9.1   GFR: CrCl cannot be calculated (Unknown ideal weight.). Liver Function Tests: Recent Labs  Lab 09/24/19 1044  AST 23  ALT 25  ALKPHOS 58  BILITOT 0.6  PROT 6.5  ALBUMIN 3.1*   Recent Labs  Lab 09/24/19 1044  LIPASE 28    No results for input(s): AMMONIA in the last 168 hours. Coagulation Profile: No results for input(s): INR, PROTIME in the last 168 hours. Cardiac Enzymes: No results for input(s): CKTOTAL, CKMB, CKMBINDEX, TROPONINI in the last 168 hours. BNP (last 3 results) Recent Labs    04/03/19 1149 09/10/19 1101  PROBNP 317 92.0   HbA1C: No results for input(s): HGBA1C in the last 72 hours. CBG: No results for input(s): GLUCAP in the last 168 hours. Lipid Profile: No results for input(s): CHOL, HDL, LDLCALC, TRIG, CHOLHDL, LDLDIRECT in the last 72 hours. Thyroid Function Tests: No results for input(s): TSH, T4TOTAL, FREET4, T3FREE, THYROIDAB in the last 72 hours. Anemia Panel: No results for input(s): VITAMINB12, FOLATE, FERRITIN, TIBC, IRON, RETICCTPCT in the last 72 hours. Sepsis Labs: Recent Labs  Lab 09/24/19 1101 09/24/19 1300  LATICACIDVEN 1.6 1.0    Recent Results (from the past 240 hour(s))  Respiratory Panel by RT PCR (Flu A&B, Covid) - Nasopharyngeal Swab     Status: None   Collection Time: 09/24/19 10:31 AM   Specimen: Nasopharyngeal Swab  Result Value Ref Range Status   SARS Coronavirus 2 by RT PCR NEGATIVE NEGATIVE Final    Comment: (NOTE) SARS-CoV-2 target nucleic acids are NOT DETECTED. The SARS-CoV-2 RNA is generally detectable in upper respiratoy specimens during the acute phase of infection. The lowest concentration of SARS-CoV-2 viral copies this assay can detect is 131 copies/mL. A negative result does not preclude SARS-Cov-2 infection and should not be used as the sole basis for treatment or other patient management decisions. A negative result may occur with  improper specimen collection/handling, submission of specimen other than nasopharyngeal swab, presence of viral mutation(s) within the areas targeted by this assay, and inadequate number of viral copies (<131 copies/mL). A negative result must be combined with clinical observations, patient history,  and epidemiological information. The expected result is Negative. Fact Sheet for Patients:  PinkCheek.be Fact Sheet for Healthcare Providers:  GravelBags.it This test is not yet ap proved or cleared by the [place] FDA and  has been authorized for detection and/or diagnosis of SARS-CoV-2 by FDA under an Emergency Use Authorization (EUA). This EUA will remain  in effect (meaning this test can be used) for the duration of the COVID-19 declaration under Section 564(b)(1) of the Act, 21 U.S.C. section 360bbb-3(b)(1), unless the authorization is terminated or revoked sooner.    Influenza A by PCR NEGATIVE NEGATIVE Final   Influenza B by PCR NEGATIVE NEGATIVE Final    Comment: (NOTE) The Xpert Xpress SARS-CoV-2/FLU/RSV assay is intended as an aid in  the diagnosis of influenza from Nasopharyngeal swab specimens and  should not be used as a sole basis for treatment. Nasal washings and  aspirates are unacceptable for Xpert Xpress SARS-CoV-2/FLU/RSV  testing. Fact Sheet for Patients: PinkCheek.be Fact Sheet for Healthcare Providers: GravelBags.it This test is not yet approved or cleared by the [place] FDA and  has been authorized for detection and/or diagnosis of SARS-CoV-2 by  FDA under an Emergency Use Authorization (EUA). This EUA will remain  in effect (meaning this test can be used) for the duration of the  Covid-19 declaration under Section 564(b)(1) of the Act, 21  U.S.C. section 360bbb-3(b)(1), unless the authorization is  terminated or revoked. Performed at [facility], [place] [address] [place], [place] [address]   Blood culture (routine x 2)     Status: None (Preliminary result)   Collection Time: 09/24/19 11:08 AM   Specimen: BLOOD RIGHT ARM  Result Value Ref Range Status   Specimen Description BLOOD RIGHT ARM  Final   Special Requests   Final      BOTTLES DRAWN AEROBIC AND ANAEROBIC Blood Culture adequate volume   Culture   Final    NO GROWTH < 24 HOURS Performed at [facility], [place] [address], [place], [place] [address]    Report Status PENDING  Incomplete  Blood culture (routine x 2)     Status: None (Preliminary result)   Collection Time: 09/24/19 11:08 AM   Specimen: BLOOD  Result Value Ref Range Status   Specimen Description BLOOD LEFT ANTECUBITAL  Final   Special Requests   Final    BOTTLES DRAWN AEROBIC ONLY Blood Culture results may not be optimal due to an inadequate volume of blood received in culture bottles   Culture   Final    NO GROWTH <  24 HOURS Performed at [facility], [place] [address] [place], [place] [address]    Report Status PENDING  Incomplete         Radiology Studies: DG Chest Portable 1 View  Result Date: 09/24/2019 CLINICAL DATA:  Oxygen desaturation and patient with a history of COPD. EXAM: PORTABLE CHEST 1 VIEW COMPARISON:  PA and lateral chest 08/28/2019, 04/02/2019 and 08/24/2018. CT chest 09/12/2019. FINDINGS: The lungs are markedly emphysematous. There is bibasilar atelectasis. No consolidative process, pneumothorax or effusion. Heart size is normal. No acute or focal bony abnormality. IMPRESSION: Emphysema without acute disease. Electronically Signed   By: Inge Rise M.D.   On: 09/24/2019 11:11        Scheduled Meds: . albuterol  2.5 mg Nebulization BID  . apixaban  2.5 mg Oral BID WC  . arformoterol  15 mcg Nebulization BID  . carvedilol  12.5 mg Oral BID WC  . cholecalciferol  1,000 Units Oral Q supper  . clopidogrel  75 mg Oral Daily  . doxycycline  100 mg Oral Q12H  . ezetimibe  10 mg Oral Daily  . feeding supplement (ENSURE ENLIVE)  237 mL Oral Q1500  . furosemide  20 mg Oral Q M,W,F  . guaiFENesin  1,200 mg Oral BID  . losartan  50 mg Oral QPM  . methylPREDNISolone (SOLU-MEDROL) injection  40 mg Intravenous Q12H  . multivitamin with minerals  1 tablet  Oral Daily  . pantoprazole  40 mg Oral QAC breakfast  . potassium chloride  10 mEq Oral Daily  . pravastatin  40 mg Oral q1800  . saccharomyces boulardii  250 mg Oral Daily   Continuous Infusions: . cefTRIAXone (ROCEPHIN)  IV Stopped (09/24/19 1457)     LOS: 1 day     Georgette Shell, MD [facility]  If 7PM-7AM, please contact night-coverage [email or web address] Password [facility] 09/25/2019, 11:49 AM

## 2019-09-25 NOTE — Discharge Instructions (Signed)

## 2019-09-25 NOTE — Evaluation (Signed)
Clinical/Bedside Swallow Evaluation Patient Details  Name: Lee [name] MRN: [id number] Date of Birth: April 30, 1931  Today's Date: 09/25/2019 Time: SLP Start Time (ACUTE ONLY): 0931 SLP Stop Time (ACUTE ONLY): 0947 SLP Time Calculation (min) (ACUTE ONLY): 16 min  Past Medical History:  Past Medical History:  Diagnosis Date  . Allergic rhinitis   . Anxiety   . Basal cell carcinoma of face   . Cardiomyopathy ([place]) 04/2015   EF 40-45% by echo. Apical anterior akinesis seen on echo, not confirmed on Myoview was negative for ischemia. Myoview suggested a small basal inferoseptal defect.  . Cholecystitis    a. complex admit 04/2015 due to sepsis due to acute Escherichia coli cholecystitis and right lower lobe CAP and Citrobacter UTI.  [name] Chronic respiratory failure ([place])    a. on home O2 after discharge 04/2015.  [name] Chronic systolic CHF (congestive heart failure) ([place])    a. Dx 04/2015 during complex admission for cholecystitis - EF 40-45% +WMA.  [name] COPD (chronic obstructive pulmonary disease) ([place])   . Erectile dysfunction   . History of endovascular stent graft for abdominal aortic aneurysm 2002   a. s/p repair  Followed by Dr. Donnetta Hutching (stable evaluation 01/2106)  . History of hiatal hernia   . HTN (hypertension)   . Hyperlipidemia   . Interstitial lung disease ([place])    a. followed by pulm.  [name] LBBB (left bundle branch block)    a. Intermittent - seen in 2013, not in 09/2013, but again in 04/2015.  . Obesity   . On home oxygen therapy    "just at night" (10/05/2015)  . PVD (peripheral vascular disease) ([place])   . Shingles   . Stroke [facility])    a. H/o aphasia - was told he'd had ministrokes. Imaging 09/2014 revealed prior infarct.   Past Surgical History:  Past Surgical History:  Procedure Laterality Date  . AORTA SURGERY  2002  . CHOLECYSTECTOMY N/A 10/05/2015   Procedure: LAPAROSCOPIC CHOLECYSTECTOMY  ;  Surgeon: Autumn Messing III, MD;  Location: [place];  Service: General;  Laterality: N/A;  .  [place]  . INTRAOPERATIVE CHOLANGIOGRAM  10/05/2015   Procedure: INTRAOPERATIVE CHOLANGIOGRAM;  Surgeon: Autumn Messing III, MD;  Location: [place];  Service: General;;  . LAPAROSCOPIC CHOLECYSTECTOMY  10/05/2015  . NM MYOVIEW LTD  05/2015   EF 49%. Small size, mild severity perfusion defect in the basal-mid inferoseptal wall. LOW RISK. No ischemia. Defect thought to be related to LBBB artifacts and not true lesion. This could also explain mild systolic dysfunction.  . TRANSTHORACIC ECHOCARDIOGRAM  04/2015   (In setting of acute illness).EF 40-45% with mid-apical anteroseptal akinesis. Moderately increased PA pressures of roughly 48 mmHg.  [name] URETHRAL STRICTURE DILATATION  late 1970s   urinary tract stretch   HPI:  Lee [name] is a 86 y.o. male with medical history significant of chronic hypoxic respiratory failure on home oxygen, COPD, chronic interstitial lung disease, cardiomyopathy, hypertension hyperlipidemia, presented with fever and increasing short of breath, fever. Per chart recent treatment for COPD exacerbation about 1 week ago Patient has chronic cough. CXR Emphysema without acute disease. BSE 08/14/18 no s/s aspiration, min oral reside, reg/thin.   Assessment / Plan / Recommendation Clinical Impression  Pt followed directions but responses to questions related to swallow function were ambiguous. Suspect straws provided increased volume and rate secondary to strong immediate cough not present with cups. Risk is greater for respiratory based dysphagia due to pt's dyspnea and increased effort  to coordinate swallow and respirations. Recommend nectar thick liquids, mechanical soft diet, pills whole in puree and MBS tomorrow to fully assess.  SLP Visit Diagnosis: Dysphagia, pharyngeal phase (R13.13)    Aspiration Risk  Mild aspiration risk;Moderate aspiration risk    Diet Recommendation Dysphagia 3 (Mech soft);Nectar-thick liquid   Liquid Administration via: Cup;No  straw Medication Administration: Whole meds with puree Supervision: Patient able to self feed;Full supervision/cueing for compensatory strategies Compensations: Slow rate;Small sips/bites;Minimize environmental distractions;Lingual sweep for clearance of pocketing Postural Changes: Seated upright at 90 degrees    Other  Recommendations Oral Care Recommendations: Oral care BID Other Recommendations: Order thickener from pharmacy   Follow up Recommendations Skilled Nursing facility      Frequency and Duration min 2x/week  2 weeks       Prognosis Prognosis for Safe Diet Advancement: (fair-good) Barriers to Reach Goals: Cognitive deficits      Swallow Study   General HPI: Lee [name] is a 86 y.o. male with medical history significant of chronic hypoxic respiratory failure on home oxygen, COPD, chronic interstitial lung disease, cardiomyopathy, hypertension hyperlipidemia, presented with fever and increasing short of breath, fever. Per chart recent treatment for COPD exacerbation about 1 week ago Patient has chronic cough. CXR Emphysema without acute disease. BSE 08/14/18 no s/s aspiration, min oral reside, reg/thin. Type of Study: Bedside Swallow Evaluation Previous Swallow Assessment: (see HPI) Diet Prior to this Study: Regular;Thin liquids Temperature Spikes Noted: No Respiratory Status: Nasal cannula History of Recent Intubation: No Behavior/Cognition: Alert;Cooperative;Pleasant mood;Confused;Requires cueing Oral Cavity Assessment: Within Functional Limits Oral Care Completed by SLP: No Oral Cavity - Dentition: Dentures, top(1-2 teeth lower) Vision: Functional for self-feeding Self-Feeding Abilities: Able to feed self Patient Positioning: Upright in bed Baseline Vocal Quality: Normal Volitional Cough: Weak Volitional Swallow: Able to elicit    Oral/Motor/Sensory Function Overall Oral Motor/Sensory Function: Within functional limits   Ice Chips Ice chips: Not tested   Thin  Liquid Thin Liquid: Impaired Presentation: Straw;Cup Pharyngeal  Phase Impairments: Cough - Immediate    Nectar Thick Nectar Thick Liquid: Not tested   Honey Thick Honey Thick Liquid: Not tested   Puree Puree: Within functional limits   Solid     Solid: Within functional limits      [name] 09/25/2019,10:15 AM  [name] [name].Ed Risk analyst [phone] Office [phone]

## 2019-09-26 ENCOUNTER — Inpatient Hospital Stay (HOSPITAL_COMMUNITY): Payer: Medicare Other

## 2019-09-26 DIAGNOSIS — Z515 Encounter for palliative care: Secondary | ICD-10-CM

## 2019-09-26 DIAGNOSIS — J441 Chronic obstructive pulmonary disease with (acute) exacerbation: Secondary | ICD-10-CM

## 2019-09-26 DIAGNOSIS — Z7189 Other specified counseling: Secondary | ICD-10-CM

## 2019-09-26 LAB — CBC WITH DIFFERENTIAL/PLATELET
Abs Immature Granulocytes: 0.27 10*3/uL — ABNORMAL HIGH (ref 0.00–0.07)
Basophils Absolute: 0 10*3/uL (ref 0.0–0.1)
Basophils Relative: 0 %
Eosinophils Absolute: 0 10*3/uL (ref 0.0–0.5)
Eosinophils Relative: 0 %
HCT: 39.2 % (ref 39.0–52.0)
Hemoglobin: 13.5 g/dL (ref 13.0–17.0)
Immature Granulocytes: 1 %
Lymphocytes Relative: 5 %
Lymphs Abs: 1 10*3/uL (ref 0.7–4.0)
MCH: 32 pg (ref 26.0–34.0)
MCHC: 34.4 g/dL (ref 30.0–36.0)
MCV: 92.9 fL (ref 80.0–100.0)
Monocytes Absolute: 0.5 10*3/uL (ref 0.1–1.0)
Monocytes Relative: 3 %
Neutro Abs: 17 10*3/uL — ABNORMAL HIGH (ref 1.7–7.7)
Neutrophils Relative %: 91 %
Platelets: 230 10*3/uL (ref 150–400)
RBC: 4.22 MIL/uL (ref 4.22–5.81)
RDW: 12.9 % (ref 11.5–15.5)
WBC: 18.8 10*3/uL — ABNORMAL HIGH (ref 4.0–10.5)
nRBC: 0 % (ref 0.0–0.2)

## 2019-09-26 LAB — COMPREHENSIVE METABOLIC PANEL
ALT: 23 U/L (ref 0–44)
AST: 24 U/L (ref 15–41)
Albumin: 2.9 g/dL — ABNORMAL LOW (ref 3.5–5.0)
Alkaline Phosphatase: 51 U/L (ref 38–126)
Anion gap: 8 (ref 5–15)
BUN: 21 mg/dL (ref 8–23)
CO2: 22 mmol/L (ref 22–32)
Calcium: 8.9 mg/dL (ref 8.9–10.3)
Chloride: 108 mmol/L (ref 98–111)
Creatinine, Ser: 1.05 mg/dL (ref 0.61–1.24)
GFR calc Af Amer: >60 mL/min (ref >60)
GFR calc non Af Amer: >60 mL/min (ref >60)
Glucose, Bld: 183 mg/dL — ABNORMAL HIGH (ref 70–99)
Potassium: 4 mmol/L (ref 3.5–5.1)
Sodium: 138 mmol/L (ref 135–145)
Total Bilirubin: 0.8 mg/dL (ref 0.3–1.2)
Total Protein: 5.7 g/dL — ABNORMAL LOW (ref 6.5–8.1)

## 2019-09-26 NOTE — Consult Note (Signed)
Consultation Note Date: 09/26/2019   Patient Name: Lee [name]  DOB: [birth year]  MRN: [id number]  Age / Sex: 86 y.o., male  PCP: [name], MD Referring Physician: Georgette Shell, MD  Reason for Consultation: Establishing goals of care  HPI/Patient Profile: 86 y.o. male  with past medical history of chronic respiratory failure on home oxygen, COPD, pulmonary fibrosis, cardiomyopathy, HTN, and HLD admitted on 09/24/2019 with increasing shortness of breath.  Patient being treated for acute on chronic respiratory failure secondary to COPD exacerbation and pneumonia in the setting of pulmonary fibrosis. Patient has had recurrent episodes of pneumonia. MBS done 2/4 and revealed aspiration risk with recommendations for nectar thick liquids and dys 3 diet. PMT consulted for [place].   Clinical Assessment and Goals of Care: I have reviewed medical records including EPIC notes, labs and imaging, received report from RN, assessed the patient and then met with patient's wife and daughter to discuss diagnosis prognosis, GOC, EOL wishes, disposition and options.  I introduced Palliative Medicine as specialized medical care for people living with serious illness. It focuses on providing relief from the symptoms and stress of a serious illness. The goal is to improve quality of life for both the patient and the family.  As far as functional and nutritional status, they tell me of Lee [name] decline - he is non-ambulatory and dependent in daily cares. They also speak of cognitive decline. His speech is limited to a few words.    We discussed his current illness and what it means in the larger context of his on-going co-morbidities.  Natural disease trajectory and expectations at EOL were discussed. Specifically discussed his frailty and failure to thrive in addition to his chronic conditions. We discussed expected continue complications and need for repeated  hospitalizations.  I attempted to elicit values and goals of care important to the patient.  Family share that's patient's goal would be to be at home and avoid future hospitalizations.   The difference between aggressive medical intervention and comfort care was considered in light of the patient's goals of care.   Advance directives, concepts specific to code status, artifical feeding and hydration, and rehospitalization were considered and discussed. We discussed code status - dicussed concern of patient receiving full code interventions as he would be unlikely to survive these and if he did he would likely have poor quality of life. Wife and daughter both seem to agree that DNR status would be appropriate and in line with patient's wishes; however, they would like time to discuss with patient's son as well before making decision.  Hospice and Palliative Care services outpatient were explained and offered. Talked at length about hospice care at home and the types of services that could be provided. Wife is interested in speaking to hospice liaison. Plans to make decision about hospice tomorrow.   Questions and concerns were addressed.  Hard Choices booklet left for review. The family was encouraged to call with questions or concerns.   Primary Decision Maker NEXT OF KIN - spouse Lee [name]    SUMMARY OF RECOMMENDATIONS   - family aware of severity of patient's illness - considering transition to DNR, hospice care at home - will f/u tomorrow  Code Status/Advance Care Planning:  Full code  Prognosis:   Months is likely given patient deteriorating health and exacerbation of chronic health conditions  Discharge Planning: To Be Determined      Primary Diagnoses: Present on Admission: . Pneumonia   I have reviewed  the medical record, interviewed the patient and family, and examined the patient. The following aspects are pertinent.  Past Medical History:  Diagnosis Date  .  Allergic rhinitis   . Anxiety   . Basal cell carcinoma of face   . Cardiomyopathy ([place]) 04/2015   EF 40-45% by echo. Apical anterior akinesis seen on echo, not confirmed on Myoview was negative for ischemia. Myoview suggested a small basal inferoseptal defect.  . Cholecystitis    a. complex admit 04/2015 due to sepsis due to acute Escherichia coli cholecystitis and right lower lobe CAP and Citrobacter UTI.  [name] Chronic respiratory failure ([place])    a. on home O2 after discharge 04/2015.  [name] Chronic systolic CHF (congestive heart failure) ([place])    a. Dx 04/2015 during complex admission for cholecystitis - EF 40-45% +WMA.  [name] COPD (chronic obstructive pulmonary disease) ([place])   . Erectile dysfunction   . History of endovascular stent graft for abdominal aortic aneurysm 2002   a. s/p repair  Followed by Dr. Donnetta Hutching (stable evaluation 01/2106)  . History of hiatal hernia   . HTN (hypertension)   . Hyperlipidemia   . Interstitial lung disease ([place])    a. followed by pulm.  [name] LBBB (left bundle branch block)    a. Intermittent - seen in 2013, not in 09/2013, but again in 04/2015.  . Obesity   . On home oxygen therapy    "just at night" (10/05/2015)  . PVD (peripheral vascular disease) ([place])   . Shingles   . Stroke [facility])    a. H/o aphasia - was told he'd had ministrokes. Imaging 09/2014 revealed prior infarct.   Social History   Socioeconomic History  . Marital status: Married    Spouse name: Not on file  . Number of children: Not on file  . Years of education: Not on file  . Highest education level: Not on file  Occupational History  . Occupation: retired  Tobacco Use  . Smoking status: Former Smoker    Packs/day: 1.00    Years: 25.00    Pack years: 25.00    Types: Cigarettes    Start date: 01/11/1951    Quit date: 08/22/1974    Years since quitting: 45.1  . Smokeless tobacco: Never Used  Substance and Sexual Activity  . Alcohol use: No  . Drug use: No  . Sexual activity: Not on file    Other Topics Concern  . Not on file  Social History Narrative  . Not on file   Social Determinants of Health   Financial Resource Strain:   . Difficulty of Paying Living Expenses: Not on file  Food Insecurity:   . Worried About Charity fundraiser in the Last Year: Not on file  . Ran Out of Food in the Last Year: Not on file  Transportation Needs:   . Lack of Transportation (Medical): Not on file  . Lack of Transportation (Non-Medical): Not on file  Physical Activity:   . Days of Exercise per Week: Not on file  . Minutes of Exercise per Session: Not on file  Stress:   . Feeling of Stress : Not on file  Social Connections:   . Frequency of Communication with Friends and Family: Not on file  . Frequency of Social Gatherings with Friends and Family: Not on file  . Attends Religious Services: Not on file  . Active Member of Clubs or Organizations: Not on file  . Attends Archivist Meetings: Not on  file  . Marital Status: Not on file   Family History  Problem Relation Age of Onset  . Hypertension Mother   . Stroke Father   . Heart disease Father   . Colon cancer Brother    Scheduled Meds: . albuterol  2.5 mg Nebulization BID  . apixaban  2.5 mg Oral BID WC  . arformoterol  15 mcg Nebulization BID  . carvedilol  12.5 mg Oral BID WC  . cholecalciferol  1,000 Units Oral Q supper  . clopidogrel  75 mg Oral Daily  . ezetimibe  10 mg Oral Daily  . feeding supplement (ENSURE ENLIVE)  237 mL Oral Q1500  . guaiFENesin  1,200 mg Oral BID  . losartan  50 mg Oral QPM  . methylPREDNISolone (SOLU-MEDROL) injection  40 mg Intravenous Q12H  . multivitamin with minerals  1 tablet Oral Daily  . pantoprazole  40 mg Oral QAC breakfast  . potassium chloride  10 mEq Oral Daily  . pravastatin  40 mg Oral q1800  . saccharomyces boulardii  250 mg Oral Daily   Continuous Infusions: . azithromycin 500 mg (09/26/19 1304)  . cefTRIAXone (ROCEPHIN)  IV 1 g (09/26/19 1435)   PRN  Meds:.acetaminophen, albuterol, benzonatate, ipratropium, nitroGLYCERIN, Resource ThickenUp Clear Allergies  Allergen Reactions  . Ciprofloxacin Other (See Comments)    Tingle feeling throughout body  . Tape Other (See Comments)    Redness, Please use "paper" tape   Review of Systems  Unable to perform ROS: Dementia  He does deny pain and shortness of breath  Physical Exam Constitutional:      General: He is not in acute distress. HENT:     Head: Normocephalic and atraumatic.  Cardiovascular:     Rate and Rhythm: Normal rate.  Pulmonary:     Effort: Pulmonary effort is normal.  Abdominal:     Palpations: Abdomen is soft.  Musculoskeletal:     Right lower leg: No edema.     Left lower leg: No edema.  Skin:    General: Skin is warm and dry.  Neurological:     Mental Status: He is alert.     Comments: Confused, limited verbal responses     Vital Signs: BP (!) 154/105   Pulse 79   Temp 98.3 F (36.8 C)   Resp 19   SpO2 93%  Pain Scale: 0-10 POSS *See Group Information*: S-Acceptable,Sleep, easy to arouse Pain Score: 0-No pain   SpO2: SpO2: 93 % O2 Device:SpO2: 93 % O2 Flow Rate: .O2 Flow Rate (L/min): 3 L/min  IO: Intake/output summary:   Intake/Output Summary (Last 24 hours) at 09/26/2019 1617 Last data filed at 09/26/2019 1430 Gross per 24 hour  Intake 1079.38 ml  Output 900 ml  Net 179.38 ml    LBM:   Baseline Weight:   Most recent weight:       Palliative Assessment/Data: PPS 40%    Time Total: 70 minutes Greater than 50%  of this time was spent counseling and coordinating care related to the above assessment and plan.  Juel Burrow, DNP, AGNP-C Palliative Medicine Team [phone] Pager: [phone]

## 2019-09-26 NOTE — Evaluation (Signed)
Physical Therapy Evaluation Patient Details Name: Lee [name] MRN: [id number] DOB: November 08, 1930 Today's Date: 09/26/2019   History of Present Illness  86 y.o. male with medical history significant of chronic hypoxic respiratory failure on home oxygen, COPD, chronic interstitial lung disease, cardiomyopathy, hypertension hyperlipidemia, presented with fever and increasing short of breath. Pt with PNA and respiratory distress.  Clinical Impression  Pt presents to PT with deficits in functional mobility, gait, balance, endurance, strength, power, endurance. Pt may be an unreliable historian, difficult to determine most recent functional status as last documented PT noted from 2 years prior. Pt currently requiring significant physical assistance to perform all functional mobility and is a high falls risk at this time. Pt will benefit from continued acute PT services to reduce caregiver burden and falls risk. If patient's PLOF is reported accurately PT recommends SNF at time of discharge to progress back toward his PLOF. If patient and family decide to discharge home the patient will benefit from [facility] if this aligns with goals of care, as well as receiving a mechanical lift and hospital bed, if these are not already in their possession.    Follow Up Recommendations SNF;Supervision/Assistance - 24 hour(if D/C home will benefit from HHPT if aligning w/ GOC)    Equipment Recommendations  Other (comment);Hospital bed(mechanical lift)    Recommendations for Other Services       Precautions / Restrictions Precautions Precautions: Fall Restrictions Weight Bearing Restrictions: No      Mobility  Bed Mobility Overal bed mobility: Needs Assistance Bed Mobility: Supine to Sit;Sit to Supine     Supine to sit: Max assist;HOB elevated Sit to supine: Max assist      Transfers Overall transfer level: Needs assistance Equipment used: 1 person hand held assist Transfers: Sit to/from Stand Sit to  Stand: Max assist         General transfer comment: pt performs 3 sit to stands with PT providing R knee block and BUE support, pt demonstrating posterior lean  Ambulation/Gait Ambulation/Gait assistance: Max assist Gait Distance (Feet): 2 Feet Assistive device: 1 person hand held assist Gait Pattern/deviations: Shuffle Gait velocity: reduced Gait velocity interpretation: <1.31 ft/sec, indicative of household ambulator General Gait Details: pt with short shuffling steps to left toward head of bed, very unsteady and requiring PT assist to prevent fall  Stairs            Wheelchair Mobility    Modified Rankin (Stroke Patients Only)       Balance Overall balance assessment: Needs assistance Sitting-balance support: Bilateral upper extremity supported;Feet supported Sitting balance-Leahy Scale: Fair Sitting balance - Comments: minG-minA at edge of bed Postural control: Posterior lean Standing balance support: Bilateral upper extremity supported Standing balance-Leahy Scale: Zero Standing balance comment: maxA with BUE support due to posterior lean                             Pertinent Vitals/Pain Pain Assessment: No/denies pain    Home Living Family/patient expects to be discharged to:: Private residence Living Arrangements: Spouse/significant other;Non-relatives/Friends Available Help at Discharge: Family;Personal care attendant(pt reports 24/7 PCS, last admission 7days/wk but no 24/7) Type of Home: House Home Access: Stairs to enter Entrance Stairs-Rails: Can reach both Entrance Stairs-Number of Steps: 4 Home Layout: One level Home Equipment: Transport chair;Shower seat;Cane - quad;Cane - single point;Hand held Tourist information centre manager - 2 wheels;Walker - 4 wheels Additional Comments: DME information obtained from rpior admission    Prior  Function Level of Independence: Needs assistance   Gait / Transfers Assistance Needed: pt reports requiring  supervision for ambulation with walker, help for all mobility  ADL's / Homemaking Assistance Needed: assist for all ADLs  Comments: pt reports aide 24/7 however last admission aide for 7 hrs/day and 5 days/wk     Hand Dominance   Dominant Hand: Right    Extremity/Trunk Assessment   Upper Extremity Assessment Upper Extremity Assessment: Generalized weakness    Lower Extremity Assessment Lower Extremity Assessment: Generalized weakness    Cervical / Trunk Assessment Cervical / Trunk Assessment: Kyphotic  Communication   Communication: Expressive difficulties  Cognition Arousal/Alertness: Awake/alert Behavior During Therapy: WFL for tasks assessed/performed Overall Cognitive Status: History of cognitive impairments - at baseline                                 General Comments: pt is alert and oriented to self and place, not to time or situation. Pt responds appropriately to most questions, although often answering with just yes or no unless further questioned. Pt does often say he does not know if he is unsure.      General Comments General comments (skin integrity, edema, etc.): VSS on 2L Jud, pt satting in mid-90s    Exercises     Assessment/Plan    PT Assessment Patient needs continued PT services  PT Problem List Decreased strength;Decreased activity tolerance;Decreased balance;Decreased mobility;Decreased cognition;Decreased knowledge of use of DME;Decreased safety awareness;Cardiopulmonary status limiting activity       PT Treatment Interventions DME instruction;Gait training;Stair training;Functional mobility training;Therapeutic activities;Therapeutic exercise;Balance training;Neuromuscular re-education;Patient/family education    PT Goals (Current goals can be found in the Care Plan section)  Acute Rehab PT Goals Patient Stated Goal: To go home PT Goal Formulation: With patient Time For Goal Achievement: 10/10/19 Potential to Achieve Goals:  Fair    Frequency Min 2X/week   Barriers to discharge        Co-evaluation               AM-PAC PT "6 Clicks" Mobility  Outcome Measure Help needed turning from your back to your side while in a flat bed without using bedrails?: A Lot Help needed moving from lying on your back to sitting on the side of a flat bed without using bedrails?: Total Help needed moving to and from a bed to a chair (including a wheelchair)?: Total Help needed standing up from a chair using your arms (e.g., wheelchair or bedside chair)?: Total Help needed to walk in hospital room?: Total Help needed climbing 3-5 steps with a railing? : Total 6 Click Score: 7    End of Session Equipment Utilized During Treatment: Gait belt;Oxygen Activity Tolerance: Patient tolerated treatment well Patient left: in bed;with call bell/phone within reach;with bed alarm set Nurse Communication: Mobility status PT Visit Diagnosis: Muscle weakness (generalized) (M62.81);Unsteadiness on feet (R26.81);Other abnormalities of gait and mobility (R26.89)    Time: [id number] PT Time Calculation (min) (ACUTE ONLY): 24 min   Charges:   PT Evaluation $PT Eval Moderate Complexity: 1 Mod PT Treatments $Therapeutic Activity: 8-22 mins        Zenaida Niece, PT, DPT Acute Rehabilitation Pager: [phone]   Zenaida Niece 09/26/2019, 1:13 PM

## 2019-09-26 NOTE — Progress Notes (Addendum)
PROGRESS NOTE    Lee [name]  [id number] DOB: 1931/04/05 DOA: 09/24/2019 PCP: [name], MD  Brief Narrative:86 y.o.malewith medical history significant ofchronic hypoxic respiratory failure on home oxygen, COPD, chronic interstitial lung disease, cardiomyopathy,hypertension hyperlipidemia, presented with fever and increasing short of breath.Patient was recently treated for COPD exacerbation about 1 week ago, when she was placed on 4 days of p.o. steroids and doxycycline by his pulmonologist. He was feeling fine until yesterday, family noticed patient started to have increasing cough, short of breath and spiking fever 102 yesterday evening, and was given Tylenol. Patient has chronic cough, most occasions were dry. Denies any dysuria, no diarrhea. ED Course:Patient was found to have wheezing on physical exam, and leukocytosis on blood work, x-ray showed no significant infiltrates, and patient was found to have hypoxia 70s on room air. After treatment of magnesium and steroid, improved to 95% on 2 L.  2/4-patient continues to be hypoxic and short of breath off of oxygen.  Continues to have a bad cough.  He is dependent on 3 L of oxygen.  Assessment & Plan:   Active Problems:   Pneumonia   Respiratory distress   Pressure injury of skin   #1 acute on chronic hypoxic respiratory failure secondary to COPD exacerbation and likely pneumonia in the setting of pulmonary fibrosis.  Patient presented with high fever cough shortness of breath and hypoxia.  His sats were 70s on room air. Patient uses oxygen at home at night only 2 L. His chest x-ray really did not show any evidence of infiltrates. However his white count was elevated at 14.1. Covid negative influenza panel negative Lactic acid 1.6 Check procalcitonin. D-dimer 0.67 Continue Rocephin, Solu-Medrol, doxycycline.  Follow-up cultures.  So far cultures have been negative. Taper steroids as patient can tolerate. Continue  Tessalon Perles, Brovana, Proventil. It does look like patient has had recurrent episodes of pneumonia and antibiotic treatment as recent as 2 weeks ago.  I will consult palliative care.  Per his daughter he does walk some at home with a walker and assistance.  But he has grown progressively deconditioned and weaker. Patient followed by Dr. Lynford Citizen as an outpatient. Daughter is interested in having him follow-up with palliative care. Discussed with daughter today.  Patient to have modified barium swallow today.  If he remains afebrile will plan discharge him home tomorrow with palliative care follow-up.  #2 essential hypertension blood pressure 154/105 this is prior to getting his morning medications. -He is on Cozaar 50 mg daily and carvedilol 12.5 twice a day.  Continue this and monitor closely.    #3 history of congestive heart failure he was on Lasix 20 mg Monday Wednesday and Friday.  Lasix on hold reevaluate daily.   #4 history of PE diagnosed in 2019 on Eliquis  #5 hyperlipidemia on statin.  Pressure Injury 09/24/19 Buttocks Right Stage 2 -  Partial thickness loss of dermis presenting as a shallow open injury with a red, pink wound bed without slough. (Active)  09/24/19 1800  Location: Buttocks  Location Orientation: Right  Staging: Stage 2 -  Partial thickness loss of dermis presenting as a shallow open injury with a red, pink wound bed without slough.  Wound Description (Comments):   Present on Admission: Yes        Estimated body mass index is 34.7 kg/m as calculated from the following:   Height as of 09/10/19: 5\' 6"  (1.676 m).   Weight as of 09/10/19: 97.5 kg.  DVT prophylaxis: Eliquis  Code Status: Full code Family Communication: Discussed with his daughter Disposition Plan patient came from home, plan is to send him home with home health PT once physical therapy see him.  Family not interested in sending him to rehab.  They have caregivers coming into the house to  take care of him few times a week.  He is admitted with COPD exacerbation and likely pneumonia on antibiotics and steroids IV.  Barriers to discharge is persistent hypoxia patient is on 2 L of oxygen at home only at night.  Patient have a modified barium swallow done today.  Plan is to discharge him home tomorrow.  Palliative care consult pending.   Consultants:   Palliative care  Procedures: None Antimicrobials: Rocephin and azithromycin  Subjective: Patient sitting up in bed was coughing when I walked into the room on 2-1/2 L  Objective: Vitals:   09/25/19 1925 09/25/19 2300 09/26/19 0810 09/26/19 0850  BP:  (!) 145/65 (!) 154/105   Pulse:  81 79   Resp:  19    Temp:  98 F (36.7 C) 98.3 F (36.8 C)   TempSrc:  Oral    SpO2: 93% 96% 94% 93%    Intake/Output Summary (Last 24 hours) at 09/26/2019 1128 Last data filed at 09/26/2019 0900 Gross per 24 hour  Intake 839.38 ml  Output 900 ml  Net -60.62 ml   There were no vitals filed for this visit.  Examination:  General exam: Appears calm and comfortable  Respiratory system: Scattered rhonchi bilaterally to auscultation. Respiratory effort normal. Cardiovascular system: S1 & S2 heard, RRR. No JVD, murmurs, rubs, gallops or clicks. No pedal edema. Gastrointestinal system: Abdomen is nondistended, soft and nontender. No organomegaly or masses felt. Normal bowel sounds heard. Central nervous system: Awake oriented to hospital extremities: Symmetric 5 x 5 power. Skin: No rashes, lesions or ulcers Psychiatry: Unable to assess    Data Reviewed: I have personally reviewed following labs and imaging studies  CBC: Recent Labs  Lab 09/24/19 1044 09/25/19 0252  WBC 14.1* 14.1*  NEUTROABS 10.3*  --   HGB 14.1 13.4  HCT 43.8 39.6  MCV 98.0 93.6  PLT 223 0000000   Basic Metabolic Panel: Recent Labs  Lab 09/24/19 1044  NA 140  K 4.2  CL 108  CO2 23  GLUCOSE 96  BUN 16  CREATININE 1.05  CALCIUM 9.1   GFR: CrCl cannot  be calculated (Unknown ideal weight.). Liver Function Tests: Recent Labs  Lab 09/24/19 1044  AST 23  ALT 25  ALKPHOS 58  BILITOT 0.6  PROT 6.5  ALBUMIN 3.1*   Recent Labs  Lab 09/24/19 1044  LIPASE 28   No results for input(s): AMMONIA in the last 168 hours. Coagulation Profile: No results for input(s): INR, PROTIME in the last 168 hours. Cardiac Enzymes: No results for input(s): CKTOTAL, CKMB, CKMBINDEX, TROPONINI in the last 168 hours. BNP (last 3 results) Recent Labs    04/03/19 1149 09/10/19 1101  PROBNP 317 92.0   HbA1C: No results for input(s): HGBA1C in the last 72 hours. CBG: No results for input(s): GLUCAP in the last 168 hours. Lipid Profile: No results for input(s): CHOL, HDL, LDLCALC, TRIG, CHOLHDL, LDLDIRECT in the last 72 hours. Thyroid Function Tests: No results for input(s): TSH, T4TOTAL, FREET4, T3FREE, THYROIDAB in the last 72 hours. Anemia Panel: No results for input(s): VITAMINB12, FOLATE, FERRITIN, TIBC, IRON, RETICCTPCT in the last 72 hours. Sepsis Labs: Recent Labs  Lab 09/24/19 1101 09/24/19 1300  LATICACIDVEN 1.6 1.0    Recent Results (from the past 240 hour(s))  Respiratory Panel by RT PCR (Flu A&B, Covid) - Nasopharyngeal Swab     Status: None   Collection Time: 09/24/19 10:31 AM   Specimen: Nasopharyngeal Swab  Result Value Ref Range Status   SARS Coronavirus 2 by RT PCR NEGATIVE NEGATIVE Final    Comment: (NOTE) SARS-CoV-2 target nucleic acids are NOT DETECTED. The SARS-CoV-2 RNA is generally detectable in upper respiratoy specimens during the acute phase of infection. The lowest concentration of SARS-CoV-2 viral copies this assay can detect is 131 copies/mL. A negative result does not preclude SARS-Cov-2 infection and should not be used as the sole basis for treatment or other patient management decisions. A negative result may occur with  improper specimen collection/handling, submission of specimen other than nasopharyngeal  swab, presence of viral mutation(s) within the areas targeted by this assay, and inadequate number of viral copies (<131 copies/mL). A negative result must be combined with clinical observations, patient history, and epidemiological information. The expected result is Negative. Fact Sheet for Patients:  PinkCheek.be Fact Sheet for Healthcare Providers:  GravelBags.it This test is not yet ap proved or cleared by the [place] FDA and  has been authorized for detection and/or diagnosis of SARS-CoV-2 by FDA under an Emergency Use Authorization (EUA). This EUA will remain  in effect (meaning this test can be used) for the duration of the COVID-19 declaration under Section 564(b)(1) of the Act, 21 U.S.C. section 360bbb-3(b)(1), unless the authorization is terminated or revoked sooner.    Influenza A by PCR NEGATIVE NEGATIVE Final   Influenza B by PCR NEGATIVE NEGATIVE Final    Comment: (NOTE) The Xpert Xpress SARS-CoV-2/FLU/RSV assay is intended as an aid in  the diagnosis of influenza from Nasopharyngeal swab specimens and  should not be used as a sole basis for treatment. Nasal washings and  aspirates are unacceptable for Xpert Xpress SARS-CoV-2/FLU/RSV  testing. Fact Sheet for Patients: PinkCheek.be Fact Sheet for Healthcare Providers: GravelBags.it This test is not yet approved or cleared by the [place] FDA and  has been authorized for detection and/or diagnosis of SARS-CoV-2 by  FDA under an Emergency Use Authorization (EUA). This EUA will remain  in effect (meaning this test can be used) for the duration of the  Covid-19 declaration under Section 564(b)(1) of the Act, 21  U.S.C. section 360bbb-3(b)(1), unless the authorization is  terminated or revoked. Performed at [facility], [place] [address], [place], [place] [address]   Blood culture (routine x  2)     Status: None (Preliminary result)   Collection Time: 09/24/19 11:08 AM   Specimen: BLOOD RIGHT ARM  Result Value Ref Range Status   Specimen Description BLOOD RIGHT ARM  Final   Special Requests   Final    BOTTLES DRAWN AEROBIC AND ANAEROBIC Blood Culture adequate volume   Culture   Final    NO GROWTH 2 DAYS Performed at [facility], [place] [address] [place], [place] [address]    Report Status PENDING  Incomplete  Blood culture (routine x 2)     Status: None (Preliminary result)   Collection Time: 09/24/19 11:08 AM   Specimen: BLOOD  Result Value Ref Range Status   Specimen Description BLOOD LEFT ANTECUBITAL  Final   Special Requests   Final    BOTTLES DRAWN AEROBIC ONLY Blood Culture results may not be optimal due to an inadequate volume of blood received in culture  bottles   Culture   Final    NO GROWTH 2 DAYS Performed at [facility], [place] [address] [place], [place] [address]    Report Status PENDING  Incomplete         Radiology Studies: No results found.      Scheduled Meds: . albuterol  2.5 mg Nebulization BID  . apixaban  2.5 mg Oral BID WC  . arformoterol  15 mcg Nebulization BID  . carvedilol  12.5 mg Oral BID WC  . cholecalciferol  1,000 Units Oral Q supper  . clopidogrel  75 mg Oral Daily  . ezetimibe  10 mg Oral Daily  . feeding supplement (ENSURE ENLIVE)  237 mL Oral Q1500  . furosemide  20 mg Oral Q M,W,F  . guaiFENesin  1,200 mg Oral BID  . losartan  50 mg Oral QPM  . methylPREDNISolone (SOLU-MEDROL) injection  40 mg Intravenous Q12H  . multivitamin with minerals  1 tablet Oral Daily  . pantoprazole  40 mg Oral QAC breakfast  . potassium chloride  10 mEq Oral Daily  . pravastatin  40 mg Oral q1800  . saccharomyces boulardii  250 mg Oral Daily   Continuous Infusions: . azithromycin 500 mg (09/25/19 1400)  . cefTRIAXone (ROCEPHIN)  IV 1 g (09/25/19 1611)     LOS: 2 days     Georgette Shell, MD [facility]  If 7PM-7AM, please contact night-coverage [email or web address] Password [facility] 09/26/2019, 11:28 AM

## 2019-09-26 NOTE — Progress Notes (Signed)
Manufacturing engineer Documentation    Liaison received referral for pt to return home s/p discharge with hospice services. PMT informed liaison that children are very interested in hospice services but that wife needs some education.   Writer attempted to contact pt's wife via phone and left VM to call [facility] with any questions this evening and otherwise a liaison will follow up tomorrow.   Pt's chart will be reviewed by hospice MD to determine eligibility once we confirm interest with wife.   Please do not hesitate to outreach with any questions and thank you for the referral.   Freddie Breech, RN [facility] Liaison [phone]

## 2019-09-26 NOTE — Progress Notes (Signed)
Modified Barium Swallow Progress Note  Patient Details  Name: Lee [name] MRN: [id number] Date of Birth: [birth year]  Today's Date: 09/26/2019  Modified Barium Swallow completed.  Full report located under Chart Review in the Imaging Section.  Brief recommendations include the following:  Clinical Impression  Pt's swallow function has deteriorated mildly since 02/2019 with + laryngeal penetration of thin liquids. Thin barium reached his valleculae and pyriform sinuses sitting for several seconds longer than typical and full epiglottic inversion not reached resulting in penetration of thin liquids mostly flash (ejected from laryngeal vestibule). One instance of vocal cord level penetration resulting in strong cough. Per chart review his health has been declining and noted daughter wishes for Palliative care involvement. Per MBS in July wife noted pt had been coughing. Recommend he continue nectar thick liquids and Dys 3 texture ( chopped meats). Once home if pt requesting regular water, a few sips after oral care should be fine or if Palliative decision may be made to fully liberalize his diet.     Swallow Evaluation Recommendations       SLP Diet Recommendations: Dysphagia 3 (Mech soft) solids;Nectar thick liquid   Liquid Administration via: Cup;Straw   Medication Administration: Crushed with puree   Supervision: Staff to assist with self feeding;Patient able to self feed;Full supervision/cueing for compensatory strategies   Compensations: Slow rate;Small sips/bites;Minimize environmental distractions;Lingual sweep for clearance of pocketing   Postural Changes: Seated upright at 90 degrees   Oral Care Recommendations: Oral care BID        [name] 09/26/2019,2:42 PM   [name] [place].Ed Risk analyst [phone] Office [phone]

## 2019-09-27 ENCOUNTER — Inpatient Hospital Stay (HOSPITAL_COMMUNITY): Payer: Medicare Other

## 2019-09-27 ENCOUNTER — Telehealth: Payer: Self-pay | Admitting: Internal Medicine

## 2019-09-27 DIAGNOSIS — Z7189 Other specified counseling: Secondary | ICD-10-CM

## 2019-09-27 DIAGNOSIS — Z515 Encounter for palliative care: Secondary | ICD-10-CM

## 2019-09-27 LAB — BASIC METABOLIC PANEL
Anion gap: 9 (ref 5–15)
BUN: 20 mg/dL (ref 8–23)
CO2: 22 mmol/L (ref 22–32)
Calcium: 8.8 mg/dL — ABNORMAL LOW (ref 8.9–10.3)
Chloride: 106 mmol/L (ref 98–111)
Creatinine, Ser: 1.01 mg/dL (ref 0.61–1.24)
GFR calc Af Amer: >60 mL/min (ref >60)
GFR calc non Af Amer: >60 mL/min (ref >60)
Glucose, Bld: 130 mg/dL — ABNORMAL HIGH (ref 70–99)
Potassium: 5.7 mmol/L — ABNORMAL HIGH (ref 3.5–5.1)
Sodium: 137 mmol/L (ref 135–145)

## 2019-09-27 LAB — CBC
HCT: 39.6 % (ref 39.0–52.0)
Hemoglobin: 13.4 g/dL (ref 13.0–17.0)
MCH: 32 pg (ref 26.0–34.0)
MCHC: 33.8 g/dL (ref 30.0–36.0)
MCV: 94.5 fL (ref 80.0–100.0)
Platelets: 250 10*3/uL (ref 150–400)
RBC: 4.19 MIL/uL — ABNORMAL LOW (ref 4.22–5.81)
RDW: 12.9 % (ref 11.5–15.5)
WBC: 17.5 10*3/uL — ABNORMAL HIGH (ref 4.0–10.5)
nRBC: 0 % (ref 0.0–0.2)

## 2019-09-27 LAB — MYCOPLASMA PNEUMONIAE ANTIBODY, IGM: Mycoplasma pneumo IgM: <770 U/mL (ref 0–769)

## 2019-09-27 MED ORDER — LOSARTAN POTASSIUM 50 MG PO TABS: 50.0000 mg | ORAL_TABLET | Freq: Every evening | ORAL | Status: DC

## 2019-09-27 MED ORDER — ACETAMINOPHEN 325 MG PO TABS: 650.0000 mg | ORAL_TABLET | Freq: Four times a day (QID) | ORAL | Status: AC | PRN

## 2019-09-27 MED ORDER — APIXABAN 2.5 MG PO TABS: 2.5000 mg | ORAL_TABLET | Freq: Two times a day (BID) | ORAL | 2 refills | Status: AC

## 2019-09-27 MED ORDER — YUPELRI 175 MCG/3ML IN SOLN: 1.0000 | Freq: Every day | RESPIRATORY_TRACT | 5 refills | Status: AC

## 2019-09-27 MED ORDER — CARVEDILOL 25 MG PO TABS
25.0000 mg | ORAL_TABLET | Freq: Two times a day (BID) | ORAL | Status: DC
  Administered 2019-09-27 – 2019-09-28 (×2): 25 mg via ORAL
  Filled 2019-09-27 (×2): qty 1

## 2019-09-27 MED ORDER — ARFORMOTEROL TARTRATE 15 MCG/2ML IN NEBU: 15.0000 ug | INHALATION_SOLUTION | Freq: Two times a day (BID) | RESPIRATORY_TRACT | 5 refills | Status: AC

## 2019-09-27 MED ORDER — SODIUM ZIRCONIUM CYCLOSILICATE 5 G PO PACK
5.0000 g | PACK | Freq: Every day | ORAL | Status: AC
  Administered 2019-09-27 – 2019-09-28 (×2): 5 g via ORAL
  Filled 2019-09-27 (×2): qty 1

## 2019-09-27 NOTE — TOC Initial Note (Signed)
Transition of Care [facility]) - Initial/Assessment Note    Patient Details  Name: Lee [name] MRN: [id number] Date of Birth: 03/31/1931  [facility] CM/SW Contact:    Maryclare Labrador, RN Phone Number: 09/27/2019, 1:12 PM  Clinical Narrative:    CM consulted for home with hospice.  [facility] given referral directly from Palliative liaison here at [facility].  CM spoke with pts daughter - pt will discharge home via private vehicle.                Expected Discharge Plan: Home w Hospice Care Barriers to Discharge: Continued Medical Work up   Patient Goals and CMS Choice     Choice offered to / list presented to : Adult Children  Expected Discharge Plan and Services Expected Discharge Plan: [place] arrangements for the past 2 months: [place]     Representative spoke with at [place]: Referral given to agency by [facility] Palliative department  Prior Living Arrangements/Services Living arrangements for the past 2 months: Single Family Home Lives with:: Spouse                   Activities of Daily Living      Permission Sought/Granted                  Emotional Assessment              Admission diagnosis:  Pneumonia [J18.9] Respiratory distress [R06.03] Hypoxia [R09.02] COPD exacerbation ([place]) [J44.1] Patient Active Problem List   Diagnosis Date Noted  . Goals of care, counseling/discussion   . Palliative care by specialist   . Pressure injury of skin 09/25/2019  . Respiratory distress   . Pneumonia 09/24/2019  . Hypoxemia   . Acute on chronic diastolic heart failure ([place]) 07/29/2019  . Coughing up blood 01/11/2019  . Physical deconditioning 08/14/2018  . COPD exacerbation ([place]) 08/13/2018  . History of recent pneumonia 07/10/2018  . Pulmonary emboli ([place]) 06/15/2018  . Aspiration pneumonia ([place]) 06/15/2018  .  Altered mental status 06/15/2018  . Chest pain 05/30/2017  . Stroke ([place])   . UTI (urinary tract infection) 05/02/2015  . H/o Nonischemic CM -- EF ~50% on f/u studies (~ LBBB related) 05/02/2015  . Sepsis ([place]) 04/30/2015  . Essential hypertension 04/30/2015  . Abdominal pain 04/30/2015  . Hyperlipidemia with target LDL less than 100 04/30/2015  . CAP (community acquired pneumonia)   . IPF (idiopathic pulmonary fibrosis) ([place]) 10/22/2014  . Cough 10/22/2014  . GERD (gastroesophageal reflux disease) 02/05/2013  . Other emphysema ([place]) 10/23/2012  . Idiopathic pulmonary fibrosis, high clinical pretest probability, declined surgical lung biopsy 09/13/2012  . Dyspnea on exertion 08/03/2012  . Hypercholesterolemia 08/03/2012  . Hypertensive heart disease without CHF (mild HFpEF) 08/03/2012  . H/O abdominal aortic aneurysm repair 08/03/2012  . Ventral hernia 08/03/2012   PCP:  [name], MD Pharmacy:   [id number] - [place], [address] [place] [address] Phone: [phone] Fax: [phone]     Social Determinants of Health (SDOH) Interventions    Readmission Risk Interventions No flowsheet data found.

## 2019-09-27 NOTE — Progress Notes (Signed)
  Speech Language Pathology Treatment: Dysphagia  Patient Details Name: Lee [name] MRN: [id number] DOB: 06-Aug-1931 Today's Date: 09/27/2019 Time: 1010-1030 SLP Time Calculation (min) (ACUTE ONLY): 20 min  Assessment / Plan / Recommendation Clinical Impression  Pt was seen for education following MBS completed 09/26/19. Unfortunately, no family was present. Pt was noted to have soiled himself - SLP cleaned pt hands and removed soiled gown/bedding. Pt was then covered with a towel and sheet and nursing was summoned.    SLP provided limited education given pt cognitive status. Written information was left in pt room, specifically related to natural and commercial thickeners, given recommendation for nectar thick liquids. SLP will continue efforts to continue education when pt's family is present.    HPI HPI: Lee [name] is a 86 y.o. male with medical history significant of chronic hypoxic respiratory failure on home oxygen, COPD, chronic interstitial lung disease, cardiomyopathy, hypertension hyperlipidemia, presented with fever and increasing short of breath, fever. Per chart recent treatment for COPD exacerbation about 1 week ago Patient has chronic cough. CXR Emphysema without acute disease. MBS 02/2019 delayed swallow and mild residue. No penetration or aspiration.       SLP Plan  Continue with current plan of care       Recommendations  Diet recommendations: Dysphagia 3 (mechanical soft);Nectar-thick liquid Liquids provided via: Cup Medication Administration: Whole meds with puree Supervision: Full supervision/cueing for compensatory strategies;Staff to assist with self feeding Compensations: Slow rate;Small sips/bites;Minimize environmental distractions;Lingual sweep for clearance of pocketing Postural Changes and/or Swallow Maneuvers: Seated upright 90 degrees;Upright 30-60 min after meal                Oral Care Recommendations: Oral care BID Follow up Recommendations:  Skilled Nursing facility;24 hour supervision/assistance SLP Visit Diagnosis: Dysphagia, oropharyngeal phase (R13.12) Plan: Continue with current plan of care       Harvey B. Quentin Ore, [facility], [place] Speech Language Pathologist Office: [phone] Pager: [phone]   Shonna Chock 09/27/2019, 11:33 AM

## 2019-09-27 NOTE — Telephone Encounter (Signed)
Pt's daughter Ivin Booty calling because pt is coming home from the hospital on 2.6 with hospice and would like to get a refill on [place]. Pt uses [facility]. Pt's daughter Ivin Booty can be reached [phone]

## 2019-09-27 NOTE — Telephone Encounter (Signed)
Spoke with daughter, needs Maretta Bees and [place] sent to [facility].  This has been sent as requested.  Nothing further needed at this time- will close encounter.

## 2019-09-27 NOTE — Progress Notes (Signed)
Daily Progress Note   Patient Name: Lee [name]       Date: 09/27/2019 DOB: October 04, 1930  Age: 86 y.o. MRN#: [id number] Attending Physician: Georgette Shell, MD Primary Care Physician: [name], MD Admit Date: 09/24/2019  Reason for Consultation/Follow-up: Establishing goals of care  Subjective: Patient sitting up in bed, appears comfortable, no family at bedside   Length of Stay: 3  Current Medications: Scheduled Meds:  . albuterol  2.5 mg Nebulization BID  . apixaban  2.5 mg Oral BID WC  . arformoterol  15 mcg Nebulization BID  . carvedilol  25 mg Oral BID WC  . cholecalciferol  1,000 Units Oral Q supper  . clopidogrel  75 mg Oral Daily  . ezetimibe  10 mg Oral Daily  . feeding supplement (ENSURE ENLIVE)  237 mL Oral Q1500  . guaiFENesin  1,200 mg Oral BID  . [START ON 09/30/2019] losartan  50 mg Oral QPM  . methylPREDNISolone (SOLU-MEDROL) injection  40 mg Intravenous Q12H  . multivitamin with minerals  1 tablet Oral Daily  . pantoprazole  40 mg Oral QAC breakfast  . pravastatin  40 mg Oral q1800  . saccharomyces boulardii  250 mg Oral Daily  . sodium zirconium cyclosilicate  5 g Oral Daily    Continuous Infusions: . azithromycin 500 mg (09/27/19 1500)  . cefTRIAXone (ROCEPHIN)  IV 1 g (09/27/19 1645)    PRN Meds: acetaminophen, albuterol, benzonatate, ipratropium, nitroGLYCERIN, Resource ThickenUp Clear  Physical Exam Constitutional:      General: He is not in acute distress. HENT:     Head: Normocephalic and atraumatic.  Cardiovascular:     Rate and Rhythm: Normal rate.  Pulmonary:     Effort: Pulmonary effort is normal.  Abdominal:     Palpations: Abdomen is soft.  Musculoskeletal:     Right lower leg: No edema.     Left lower leg: No edema.  Skin:    General:  Skin is warm and dry.  Neurological:     Mental Status: He is alert.     Comments: Pleasantly confused, limited verbal responses             Vital Signs: BP 128/84 (BP Location: Right Arm)   Pulse 72   Temp 98 F (36.7 C)   Resp 18   SpO2 99%  SpO2: SpO2: 99 % O2 Device: O2 Device: Nasal Cannula O2 Flow Rate: O2 Flow Rate (L/min): 2 L/min  Intake/output summary:   Intake/Output Summary (Last 24 hours) at 09/27/2019 1712 Last data filed at 09/27/2019 0300 Gross per 24 hour  Intake 120 ml  Output 300 ml  Net -180 ml   LBM:   Baseline Weight:   Most recent weight:         Palliative Assessment/Data: PPS 40%    Flowsheet Rows     Most Recent Value  Intake Tab  Referral Department  Hospitalist  Unit at Time of Referral  Cardiac/Telemetry Unit  Palliative Care Primary Diagnosis  Pulmonary  Date Notified  09/25/19  Palliative Care Type  New Palliative care  Reason for referral  Clarify Goals of Care  Date of Admission  09/24/19  Date first seen by  Palliative Care  09/26/19  # of days Palliative referral response time  1 Day(s)  # of days IP prior to Palliative referral  1  Clinical Assessment  Palliative Performance Scale Score  40%  Psychosocial & Spiritual Assessment  Palliative Care Outcomes  Patient/Family meeting held?  Yes  Who was at the meeting?  wife and daughter  Palliative Care Outcomes  Clarified goals of care, Provided end of life care assistance, Provided psychosocial or spiritual support      Patient Active Problem List   Diagnosis Date Noted  . Goals of care, counseling/discussion   . Palliative care by specialist   . Pressure injury of skin 09/25/2019  . Respiratory distress   . Pneumonia 09/24/2019  . Hypoxemia   . Acute on chronic diastolic heart failure ([place]) 07/29/2019  . Coughing up blood 01/11/2019  . Physical deconditioning 08/14/2018  . COPD exacerbation ([place]) 08/13/2018  . History of recent pneumonia 07/10/2018  . Pulmonary emboli  ([place]) 06/15/2018  . Aspiration pneumonia ([place]) 06/15/2018  . Altered mental status 06/15/2018  . Chest pain 05/30/2017  . Stroke ([place])   . UTI (urinary tract infection) 05/02/2015  . H/o Nonischemic CM -- EF ~50% on f/u studies (~ LBBB related) 05/02/2015  . Sepsis ([place]) 04/30/2015  . Essential hypertension 04/30/2015  . Abdominal pain 04/30/2015  . Hyperlipidemia with target LDL less than 100 04/30/2015  . CAP (community acquired pneumonia)   . IPF (idiopathic pulmonary fibrosis) ([place]) 10/22/2014  . Cough 10/22/2014  . GERD (gastroesophageal reflux disease) 02/05/2013  . Other emphysema ([place]) 10/23/2012  . Idiopathic pulmonary fibrosis, high clinical pretest probability, declined surgical lung biopsy 09/13/2012  . Dyspnea on exertion 08/03/2012  . Hypercholesterolemia 08/03/2012  . Hypertensive heart disease without CHF (mild HFpEF) 08/03/2012  . H/O abdominal aortic aneurysm repair 08/03/2012  . Ventral hernia 08/03/2012    Palliative Care Assessment & Plan   HPI: 86 y.o. male  with past medical history of chronic respiratory failure on home oxygen, COPD, pulmonary fibrosis, cardiomyopathy, HTN, and HLD admitted on 09/24/2019 with increasing shortness of breath.  Patient being treated for acute on chronic respiratory failure secondary to COPD exacerbation and pneumonia in the setting of pulmonary fibrosis. Patient has had recurrent episodes of pneumonia. MBS done 2/4 and revealed aspiration risk with recommendations for nectar thick liquids and dys 3 diet. PMT consulted for [place].   Assessment: Follow up with patient and wife today - provided clinical update for wife. She was also able to speak with Dr. Rodena Piety this morning. She shares they have decided to proceed with hospice care at home following our conversation yesterday - all questions addressed about this. We then discussed code status again - she remains unsure and has been unable to discuss this with her children together. We  discussed concerns about Lee [name] receiving full code interventions - concerns that this would only prolong suffering and not add to his quality of life.   Planned to speak with wife at bedside this afternoon to readdress code status; however, I have not seen her and unable to reach her by phone this afternoon. Left voicemail with patient's daughter.   Recommendations/Plan:  Discharge home with hospice services  Continue discussions about code status - DNR has been recommended, daughter seems to agree, wife unsure  Code Status:  Full code  Prognosis:  Months likely given deteriorating health, decline in function, risk of aspiration, exacerbation of chronic health conditions  Discharge Planning:  Home with Hospice  Care plan was discussed with patient RN and wife  Thank you for allowing the Palliative Medicine Team to assist in the care of this patient.   Total Time 35 minutes Prolonged Time Billed  no         Greater than 50%  of this time was spent counseling and coordinating care related to the above assessment and plan.  Juel Burrow, DNP, [facility] Palliative Medicine Team Team Phone # [phone]  Pager [phone]

## 2019-09-27 NOTE — Progress Notes (Signed)
Curator with dtr to confirm interest in hospice services once d/c. Questions answered, support provided.  Plan to d/c Saturday home.  Has:  O2 and travel wheelchair with [employer]  Needs:  O2 (O2 needs to be swapped to different vendor), hospital bed, travel wheelchair--[facility] will order  If needed, please arrange for any scripts that may be needed for comfort so there is no delay in symptom management prior to hospice services beginning.   Venia Carbon RN, BSN, [facility] Liaison (in [place]) [phone]

## 2019-09-27 NOTE — Progress Notes (Signed)
PROGRESS NOTE    ARIHAN [name]  [id number] DOB: 1930/12/01 DOA: 09/24/2019 PCP: [name], MD    Brief Narrative:86 y.o.malewith medical history significant ofchronic hypoxic respiratory failure on home oxygen, COPD, chronic interstitial lung disease, cardiomyopathy,hypertension hyperlipidemia, presented with fever and increasing short of breath.Patient was recently treated for COPD exacerbation about 1 week ago, when she was placed on 4 days of p.o. steroids and doxycycline by his pulmonologist. He was feeling fine until yesterday, family noticed patient started to have increasing cough, short of breath and spiking fever 102 yesterday evening, and was given Tylenol. Patient has chronic cough, most occasions were dry. Denies any dysuria, no diarrhea. ED Course:Patient was found to have wheezing on physical exam, and leukocytosis on blood work, x-ray showed no significant infiltrates, and patient was found to have hypoxia 70s on room air. After treatment of magnesium and steroid, improved to 95% on 2 L.   Assessment & Plan:   Active Problems:   Pneumonia   Respiratory distress   Pressure injury of skin   Goals of care, counseling/discussion   Palliative care by specialist  #1 acute on chronic hypoxic respiratory failure secondary to COPD exacerbation/bronchitis in the setting of pulmonary fibrosis.  Chest x-ray showed no evidence of infiltrates at the time of admission.  Repeat chest x-ray done today still does not show any evidence of infiltrates.  Patient presented with high fever cough shortness of breath and hypoxia. His sats were 70s on room air. Patient uses oxygen at home at night only 2 L. His chest x-ray really did not show any evidence of infiltrates. However his white count was elevated at 14.1. Covid negative influenza panel negative Lactic acid 1.6 Check procalcitonin. D-dimer 0.67 Continue Rocephin, Solu-Medrol, doxycycline. Follow-up cultures. So far  cultures have been negative. Taper steroids as patient can tolerate. Continue Tessalon Perles, Brovana, Proventil. It does look like patient has had recurrent episodes of pneumonia and antibiotic treatment as recent as 2 weeks ago.  Appreciate palliative care input.  Palliative care to discuss with wife today regarding discharge plans Home with  home health versus home with hospice.  #2 essential hypertension blood pressure-blood pressure 176/74.  Continue Cozaar 50 mg daily and Will increase carvedilol to 25 mg twice a day since he is persistently hypertensive and was tachycardic in the 140s this morning.  #3 history of congestive heart failure he was on Lasix 20 mg Monday Wednesday and Friday.  Lasix on hold reevaluate daily.  #4 history of PE diagnosed in 2019 on Eliquis 2.5 mg twice a day.  #5 hyperlipidemia on statin.   Pressure Injury 09/24/19 Buttocks Right Stage 2 -  Partial thickness loss of dermis presenting as a shallow open injury with a red, pink wound bed without slough. (Active)  09/24/19 1800  Location: Buttocks  Location Orientation: Right  Staging: Stage 2 -  Partial thickness loss of dermis presenting as a shallow open injury with a red, pink wound bed without slough.  Wound Description (Comments):   Present on Admission: Yes    Estimated body mass index is 34.7 kg/m as calculated from the following:   Height as of 09/10/19: 5\' 6"  (1.676 m).   Weight as of 09/10/19: 97.5 kg. DVT prophylaxis:Eliquis Code Status:Full code Family Communication:Talk to his wife for 29 minutes over the phone  disposition -patient came from home, plan is to discharge him home with home health versus home with hospice.  Wife would like to discuss with palliative care regarding discharge  plans.  I did explain to the wife that he should be able to go home tomorrow.  Patient have caregivers at home and he has 24-hour care at home.  Consultants:  Palliative  care  Procedures:None Antimicrobials:Rocephin and azithromycin  Subjective:  Getting a breathing treatment awake No events overnight - Objective: Vitals:   09/26/19 1940 09/26/19 1942 09/27/19 0743 09/27/19 0756  BP:  (!) 141/56 (!) 176/74   Pulse:  78 65 66  Resp:   20 18  Temp:  97.9 F (36.6 C) 97.8 F (36.6 C)   TempSrc:  Axillary    SpO2: 97% 98% 99% 99%    Intake/Output Summary (Last 24 hours) at 09/27/2019 1044 Last data filed at 09/27/2019 0300 Gross per 24 hour  Intake 360 ml  Output 300 ml  Net 60 ml   There were no vitals filed for this visit.  Examination:  General exam: Appears calm and comfortable  Respiratory system: Scattered rhonchi bilaterally to auscultation. Respiratory effort normal. Cardiovascular system: S1 & S2 heard, RRR. No JVD, murmurs, rubs, gallops or clicks. No pedal edema. Gastrointestinal system: Abdomen is nondistended, soft and nontender. No organomegaly or masses felt. Normal bowel sounds heard. Central nervous system: Alert and oriented. No focal neurological deficits. Extremities: Symmetric 5 x 5 power. Skin: No rashes, lesions or ulcers Psychiatry: Judgement and insight appear normal. Mood & affect appropriate.     Data Reviewed: I have personally reviewed following labs and imaging studies  CBC: Recent Labs  Lab 09/24/19 1044 09/25/19 0252 09/26/19 1124  WBC 14.1* 14.1* 18.8*  NEUTROABS 10.3*  --  17.0*  HGB 14.1 13.4 13.5  HCT 43.8 39.6 39.2  MCV 98.0 93.6 92.9  PLT 223 243 [id number]   Basic Metabolic Panel: Recent Labs  Lab 09/24/19 1044 09/26/19 1124  NA 140 138  K 4.2 4.0  CL 108 108  CO2 23 22  GLUCOSE 96 183*  BUN 16 21  CREATININE 1.05 1.05  CALCIUM 9.1 8.9   GFR: CrCl cannot be calculated (Unknown ideal weight.). Liver Function Tests: Recent Labs  Lab 09/24/19 1044 09/26/19 1124  AST 23 24  ALT 25 23  ALKPHOS 58 51  BILITOT 0.6 0.8  PROT 6.5 5.7*  ALBUMIN 3.1* 2.9*   Recent Labs  Lab  09/24/19 1044  LIPASE 28   No results for input(s): AMMONIA in the last 168 hours. Coagulation Profile: No results for input(s): INR, PROTIME in the last 168 hours. Cardiac Enzymes: No results for input(s): CKTOTAL, CKMB, CKMBINDEX, TROPONINI in the last 168 hours. BNP (last 3 results) Recent Labs    04/03/19 1149 09/10/19 1101  PROBNP 317 92.0   HbA1C: No results for input(s): HGBA1C in the last 72 hours. CBG: No results for input(s): GLUCAP in the last 168 hours. Lipid Profile: No results for input(s): CHOL, HDL, LDLCALC, TRIG, CHOLHDL, LDLDIRECT in the last 72 hours. Thyroid Function Tests: No results for input(s): TSH, T4TOTAL, FREET4, T3FREE, THYROIDAB in the last 72 hours. Anemia Panel: No results for input(s): VITAMINB12, FOLATE, FERRITIN, TIBC, IRON, RETICCTPCT in the last 72 hours. Sepsis Labs: Recent Labs  Lab 09/24/19 1101 09/24/19 1300  LATICACIDVEN 1.6 1.0    Recent Results (from the past 240 hour(s))  Respiratory Panel by RT PCR (Flu A&B, Covid) - Nasopharyngeal Swab     Status: None   Collection Time: 09/24/19 10:31 AM   Specimen: Nasopharyngeal Swab  Result Value Ref Range Status   SARS Coronavirus 2 by RT PCR NEGATIVE  NEGATIVE Final    Comment: (NOTE) SARS-CoV-2 target nucleic acids are NOT DETECTED. The SARS-CoV-2 RNA is generally detectable in upper respiratoy specimens during the acute phase of infection. The lowest concentration of SARS-CoV-2 viral copies this assay can detect is 131 copies/mL. A negative result does not preclude SARS-Cov-2 infection and should not be used as the sole basis for treatment or other patient management decisions. A negative result may occur with  improper specimen collection/handling, submission of specimen other than nasopharyngeal swab, presence of viral mutation(s) within the areas targeted by this assay, and inadequate number of viral copies (<131 copies/mL). A negative result must be combined with  clinical observations, patient history, and epidemiological information. The expected result is Negative. Fact Sheet for Patients:  PinkCheek.be Fact Sheet for Healthcare Providers:  GravelBags.it This test is not yet ap proved or cleared by the [place] FDA and  has been authorized for detection and/or diagnosis of SARS-CoV-2 by FDA under an Emergency Use Authorization (EUA). This EUA will remain  in effect (meaning this test can be used) for the duration of the COVID-19 declaration under Section 564(b)(1) of the Act, 21 U.S.C. section 360bbb-3(b)(1), unless the authorization is terminated or revoked sooner.    Influenza A by PCR NEGATIVE NEGATIVE Final   Influenza B by PCR NEGATIVE NEGATIVE Final    Comment: (NOTE) The Xpert Xpress SARS-CoV-2/FLU/RSV assay is intended as an aid in  the diagnosis of influenza from Nasopharyngeal swab specimens and  should not be used as a sole basis for treatment. Nasal washings and  aspirates are unacceptable for Xpert Xpress SARS-CoV-2/FLU/RSV  testing. Fact Sheet for Patients: PinkCheek.be Fact Sheet for Healthcare Providers: GravelBags.it This test is not yet approved or cleared by the [place] FDA and  has been authorized for detection and/or diagnosis of SARS-CoV-2 by  FDA under an Emergency Use Authorization (EUA). This EUA will remain  in effect (meaning this test can be used) for the duration of the  Covid-19 declaration under Section 564(b)(1) of the Act, 21  U.S.C. section 360bbb-3(b)(1), unless the authorization is  terminated or revoked. Performed at [facility], [place] [address], [place], [place] [address]   Blood culture (routine x 2)     Status: None (Preliminary result)   Collection Time: 09/24/19 11:08 AM   Specimen: BLOOD RIGHT ARM  Result Value Ref Range Status   Specimen Description BLOOD RIGHT  ARM  Final   Special Requests   Final    BOTTLES DRAWN AEROBIC AND ANAEROBIC Blood Culture adequate volume   Culture   Final    NO GROWTH 2 DAYS Performed at [facility], [place] [address], [place], [place] [address]    Report Status PENDING  Incomplete  Blood culture (routine x 2)     Status: None (Preliminary result)   Collection Time: 09/24/19 11:08 AM   Specimen: BLOOD  Result Value Ref Range Status   Specimen Description BLOOD LEFT ANTECUBITAL  Final   Special Requests   Final    BOTTLES DRAWN AEROBIC ONLY Blood Culture results may not be optimal due to an inadequate volume of blood received in culture bottles   Culture   Final    NO GROWTH 2 DAYS Performed at [facility], [place] [address] [place], [place] [address]    Report Status PENDING  Incomplete         Radiology Studies: DG Chest 1 View  Result Date: 09/27/2019 CLINICAL DATA:  HYPOXEMIA, PREVIOUS  ABNORMAL CHEST X-RAY EXAM: CHEST  1 VIEW COMPARISON:  09/24/2019 FINDINGS: SINGLE FRONTAL VIEW OF THE CHEST DEMONSTRATES A STABLE CARDIAC SILHOUETTE. STABLE INTERSTITIAL PROMINENCE WITHOUT FOCAL CONSOLIDATION, EFFUSION, OR PNEUMOTHORAX. NO ACUTE BONY ABNORMALITIES. IMPRESSION: 1. STABLE EXAM, NO ACUTE PROCESS. Electronically Signed   By: Randa Ngo M.D.   On: 09/27/2019 07:56   DG Swallowing Func-Speech Pathology  Result Date: 09/26/2019 Objective Swallowing Evaluation: Type of Study: MBS-Modified Barium Swallow Study (02/2019)  Patient Details Name: ELWIN HOLLANDS MRN: [id number] Date of Birth: 07/15/1931 Today's Date: 09/26/2019 Time: SLP Start Time (ACUTE ONLY): 1330 -SLP Stop Time (ACUTE ONLY): 1350 SLP Time Calculation (min) (ACUTE ONLY): 20 min Past Medical History: Past Medical History: Diagnosis Date . Allergic rhinitis  . Anxiety  . Basal cell carcinoma of face  . Cardiomyopathy ([place]) 04/2015  EF 40-45% by echo. Apical anterior akinesis seen on echo, not confirmed on Myoview was negative for ischemia. Myoview  suggested a small basal inferoseptal defect. . Cholecystitis   a. complex admit 04/2015 due to sepsis due to acute Escherichia coli cholecystitis and right lower lobe CAP and Citrobacter UTI. [name] Chronic respiratory failure ([place])   a. on home O2 after discharge 04/2015. [name] Chronic systolic CHF (congestive heart failure) ([place])   a. Dx 04/2015 during complex admission for cholecystitis - EF 40-45% +WMA. [name] COPD (chronic obstructive pulmonary disease) ([place])  . Erectile dysfunction  . History of endovascular stent graft for abdominal aortic aneurysm 2002  a. s/p repair  Followed by Dr. Donnetta Hutching (stable evaluation 01/2106) . History of hiatal hernia  . HTN (hypertension)  . Hyperlipidemia  . Interstitial lung disease ([place])   a. followed by pulm. [name] LBBB (left bundle branch block)   a. Intermittent - seen in 2013, not in 09/2013, but again in 04/2015. . Obesity  . On home oxygen therapy   "just at night" (10/05/2015) . PVD (peripheral vascular disease) ([place])  . Shingles  . Stroke [facility])   a. H/o aphasia - was told he'd had ministrokes. Imaging 09/2014 revealed prior infarct. Past Surgical History: Past Surgical History: Procedure Laterality Date . AORTA SURGERY  2002 . CHOLECYSTECTOMY N/A 10/05/2015  Procedure: LAPAROSCOPIC CHOLECYSTECTOMY  ;  Surgeon: Autumn Messing III, MD;  Location: [facility];  Service: General;  Laterality: N/A; . [place] . INTRAOPERATIVE CHOLANGIOGRAM  10/05/2015  Procedure: INTRAOPERATIVE CHOLANGIOGRAM;  Surgeon: Autumn Messing III, MD;  Location: [facility];  Service: General;; . LAPAROSCOPIC CHOLECYSTECTOMY  10/05/2015 . NM MYOVIEW LTD  05/2015  EF 49%. Small size, mild severity perfusion defect in the basal-mid inferoseptal wall. LOW RISK. No ischemia. Defect thought to be related to LBBB artifacts and not true lesion. This could also explain mild systolic dysfunction. . TRANSTHORACIC ECHOCARDIOGRAM  04/2015  (In setting of acute illness).EF 40-45% with mid-apical anteroseptal akinesis. Moderately increased PA pressures  of roughly 48 mmHg. [name] URETHRAL STRICTURE DILATATION  late 1970s  urinary tract stretch HPI: Lee [name] is a 86 y.o. male with medical history significant of chronic hypoxic respiratory failure on home oxygen, COPD, chronic interstitial lung disease, cardiomyopathy, hypertension hyperlipidemia, presented with fever and increasing short of breath, fever. Per chart recent treatment for COPD exacerbation about 1 week ago Patient has chronic cough. CXR Emphysema without acute disease. MBS 02/2019 delayed swallow and mild residue. No penetration or aspiration.  No data recorded Assessment / Plan / Recommendation CHL IP CLINICAL IMPRESSIONS 09/26/2019 Clinical Impression Pt's swallow function has deteriorated mildly since 02/2019 with + laryngeal penetration  of thin liquids. Thin barium reached his valleculae and pyriform sinuses sitting for several seconds longer than typical and full epiglottic inversion not reached resulting in penetration of thin liquids mostly flash (ejected from laryngeal vestibule). One instance of vocal cord level penetration resulting in strong cough. Per chart review his health has been declining and noted daughter wishes for Palliative care involvement. Per MBS in July wife noted pt had been coughing. Recommend he continue nectar thick liquids and Dys 3 texture ( chopped meats). Once home if pt requesting regular water, a few sips after oral care should be fine or if Palliative decision may be made to fully liberalize his diet.   SLP Visit Diagnosis Dysphagia, oropharyngeal phase (R13.12) Attention and concentration deficit following -- Frontal lobe and executive function deficit following -- Impact on safety and function Moderate aspiration risk   CHL IP TREATMENT RECOMMENDATION 09/26/2019 Treatment Recommendations Therapy as outlined in treatment plan below   Prognosis 09/26/2019 Prognosis for Safe Diet Advancement Fair Barriers to Reach Goals Cognitive deficits Barriers/Prognosis Comment -- CHL  IP DIET RECOMMENDATION 09/26/2019 SLP Diet Recommendations Dysphagia 3 (Mech soft) solids;Nectar thick liquid Liquid Administration via Cup;Straw Medication Administration Crushed with puree Compensations Slow rate;Small sips/bites;Minimize environmental distractions;Lingual sweep for clearance of pocketing Postural Changes Seated upright at 90 degrees   CHL IP OTHER RECOMMENDATIONS 09/26/2019 Recommended Consults -- Oral Care Recommendations Oral care BID Other Recommendations --   CHL IP FOLLOW UP RECOMMENDATIONS 09/26/2019 Follow up Recommendations Skilled Nursing facility   [facility] IP FREQUENCY AND DURATION 09/26/2019 Speech Therapy Frequency (ACUTE ONLY) min 2x/week Treatment Duration 2 weeks      CHL IP ORAL PHASE 09/26/2019 Oral Phase Impaired Oral - Pudding Teaspoon -- Oral - Pudding Cup -- Oral - Honey Teaspoon -- Oral - Honey Cup -- Oral - Nectar Teaspoon -- Oral - Nectar Cup Decreased bolus cohesion Oral - Nectar Straw -- Oral - Thin Teaspoon -- Oral - Thin Cup Decreased bolus cohesion Oral - Thin Straw Decreased bolus cohesion Oral - Puree -- Oral - Mech Soft -- Oral - Regular Reduced posterior propulsion Oral - Multi-Consistency -- Oral - Pill -- Oral Phase - Comment --  CHL IP PHARYNGEAL PHASE 09/26/2019 Pharyngeal Phase Impaired Pharyngeal- Pudding Teaspoon -- Pharyngeal -- Pharyngeal- Pudding Cup -- Pharyngeal -- Pharyngeal- Honey Teaspoon -- Pharyngeal -- Pharyngeal- Honey Cup -- Pharyngeal -- Pharyngeal- Nectar Teaspoon -- Pharyngeal -- Pharyngeal- Nectar Cup [facility] Pharyngeal Material does not enter airway Pharyngeal- Nectar Straw -- Pharyngeal -- Pharyngeal- Thin Teaspoon -- Pharyngeal -- Pharyngeal- Thin Cup Delayed swallow initiation-pyriform sinuses;Delayed swallow initiation-vallecula;Penetration/Aspiration during swallow Pharyngeal Material enters airway, remains ABOVE vocal cords then ejected out;Material enters airway, remains ABOVE vocal cords and not ejected out Pharyngeal- Thin Straw  Penetration/Aspiration during swallow Pharyngeal Material enters airway, CONTACTS cords and then ejected out Pharyngeal- Puree -- Pharyngeal -- Pharyngeal- Mechanical Soft -- Pharyngeal -- Pharyngeal- Regular WFL Pharyngeal -- Pharyngeal- Multi-consistency -- Pharyngeal -- Pharyngeal- Pill -- Pharyngeal -- Pharyngeal Comment --  CHL IP CERVICAL ESOPHAGEAL PHASE 09/26/2019 Cervical Esophageal Phase WFL Pudding Teaspoon -- Pudding Cup -- Honey Teaspoon -- Honey Cup -- Nectar Teaspoon -- Nectar Cup -- Nectar Straw -- Thin Teaspoon -- Thin Cup -- Thin Straw -- Puree -- Mechanical Soft -- Regular -- Multi-consistency -- Pill -- Cervical Esophageal Comment -- [name] 09/26/2019, 2:41 PM [name] Litaker M.Ed Risk analyst [phone] Office [phone]  Scheduled Meds: . albuterol  2.5 mg Nebulization BID  . apixaban  2.5 mg Oral BID WC  . arformoterol  15 mcg Nebulization BID  . carvedilol  12.5 mg Oral BID WC  . cholecalciferol  1,000 Units Oral Q supper  . clopidogrel  75 mg Oral Daily  . ezetimibe  10 mg Oral Daily  . feeding supplement (ENSURE ENLIVE)  237 mL Oral Q1500  . guaiFENesin  1,200 mg Oral BID  . losartan  50 mg Oral QPM  . methylPREDNISolone (SOLU-MEDROL) injection  40 mg Intravenous Q12H  . multivitamin with minerals  1 tablet Oral Daily  . pantoprazole  40 mg Oral QAC breakfast  . potassium chloride  10 mEq Oral Daily  . pravastatin  40 mg Oral q1800  . saccharomyces boulardii  250 mg Oral Daily   Continuous Infusions: . azithromycin 500 mg (09/26/19 1304)  . cefTRIAXone (ROCEPHIN)  IV 1 g (09/26/19 1435)     LOS: 3 days    Georgette Shell, MD [facility] 09/27/2019, 10:44 AM

## 2019-09-28 MED ORDER — CARVEDILOL 25 MG PO TABS: 25.0000 mg | ORAL_TABLET | Freq: Two times a day (BID) | ORAL | 2 refills | Status: AC

## 2019-09-28 MED ORDER — PREDNISONE 10 MG PO TABS: ORAL_TABLET | ORAL | 0 refills | Status: AC

## 2019-09-28 NOTE — Progress Notes (Signed)
Manufacturing engineer ([facility])  Spoke with family, DME delivered yesterday and they plan to pick him up later this afternoon to take him home in POV.  [facility] has appointment set up for Sunday.  Family has our 24 hour information and back up plan should they need something prior to his admission tomorrow.  Venia Carbon RN, BSN, [facility] Liaison  [phone]

## 2019-09-28 NOTE — Discharge Summary (Signed)
Physician Discharge Summary  THO RADICE [id number] DOB: 04-Nov-1930 DOA: 86/09/24/2019  PCP: [name], MD  Admit date: 09/24/2019 Discharge date: 09/28/2019  Admitted From: Home Disposition: Home Recommendations for Outpatient Follow-up:  1. Follow up with PCP in 1-2 weeks 2. Please obtain BMP/CBC in one week 3. Please follow up on the following pending results:  Home Health: Home with hospice  equipment/Devices: None Discharge Condition: Improved Home with hospice CODE STATUS full code  diet recommendation cardiac diet/dysphagia 3 diet/nectar thick liquids Brief/Interim Summary:86 y.o.malewith medical history significant ofchronic hypoxic respiratory failure on home oxygen, COPD, chronic interstitial lung disease, cardiomyopathy,hypertension hyperlipidemia, presented with fever and increasing short of breath.Patient was recently treated for COPD exacerbation about 1 week ago, when she was placed on 4 days of p.o. steroids and doxycycline by his pulmonologist. He was feeling fine until yesterday, family noticed patient started to have increasing cough, short of breath and spiking fever 102 yesterday evening, and was given Tylenol. Patient has chronic cough, most occasions were dry. Denies any dysuria, no diarrhea. ED Course:Patient was found to have wheezing on physical exam, and leukocytosis on blood work, x-ray showed no significant infiltrates, and patient was found to have hypoxia 70s on room air. After treatment of magnesium and steroid, improved to 95% on 2 L Discharge Diagnoses:  Active Problems:   Pneumonia   Hypoxemia   Respiratory distress   Pressure injury of skin   Goals of care, counseling/discussion   Palliative care by specialist   Pressure Injury 09/24/19 Buttocks Right Stage 2 -  Partial thickness loss of dermis presenting as a shallow open injury with a red, pink wound bed without slough. (Active)  09/24/19 1800  Location: Buttocks  Location Orientation:  Right  Staging: Stage 2 -  Partial thickness loss of dermis presenting as a shallow open injury with a red, pink wound bed without slough.  Wound Description (Comments):   Present on Admission: Yes    #1 acute on chronic hypoxic respiratory failure secondary to COPD exacerbation/bronchitis in the setting of pulmonary fibrosis.  Chest x-ray showed no evidence of infiltrates at the time of admission.  Repeat chest x-ray done today still does not show any evidence of infiltrates.  Patient presented with high fever cough shortness of breath and hypoxia. His sats were 70s on room air. Patient uses oxygen at home at night only 2 L. His chest x-ray really did not show any evidence of infiltrates. However his white count was elevated at 14.1. Covid negative influenza panel negative Lactic acid 1.6 Check procalcitonin. D-dimer 0.67 He was treated with Rocephin and doxycycline.  He was also treated with IV Solu-Medrol.  She will be discharged home on a tapering dose of prednisone.  Continue his home inhalers Brovana and Proventil. Appreciate palliative care input.  Plan is to discharge him home with hospice.  However 86 is still thinking about CODE STATUS.  He remains a full code as of now.  #2 essential hypertensionblood pressure-blood pressure 176/74.  Continue Cozaar 50 mg daily and Will increase carvedilol to 25 mg twice a day since he is persistently hypertensive and was tachycardic in the 140s this morning.  #3 history of congestive heart failure hewason Lasix 20 mg Monday Wednesday and Friday. Lasix on hold reevaluate daily.  #4 history of PE diagnosed in 2019 on Eliquis 2.5 mg twice a day.    Estimated body mass index is 35.12 kg/m as calculated from the following:   Height as of 09/10/19: 5\' 6"  (  1.676 m).   Weight as of this encounter: 98.7 kg.  Discharge Instructions  Discharge Instructions    Call MD for:  difficulty breathing, headache or visual disturbances   Complete  by: As directed    Call MD for:  persistant nausea and vomiting   Complete by: As directed    Call MD for:  severe uncontrolled pain   Complete by: As directed    Call MD for:  temperature >100.4   Complete by: As directed    Diet - low sodium heart healthy   Complete by: As directed    Increase activity slowly   Complete by: As directed      Allergies as of 09/28/2019      Reactions   Ciprofloxacin Other (See Comments)   Tingle feeling throughout body   Tape Other (See Comments)   Redness, Please use "paper" tape      Medication List    STOP taking these medications   doxycycline 100 MG tablet Commonly known as: VIBRA-TABS     TAKE these medications   acetaminophen 325 MG tablet Commonly known as: TYLENOL Take 2 tablets (650 mg total) by mouth every 6 (six) hours as needed for moderate pain.   albuterol 108 (90 Base) MCG/ACT inhaler Commonly known as: VENTOLIN HFA Inhale 2 puffs into the lungs every 6 (six) hours as needed for wheezing or shortness of breath.   albuterol (2.5 MG/3ML) 0.083% nebulizer solution Commonly known as: PROVENTIL Take 3 mLs (2.5 mg total) by nebulization 2 (two) times daily.   apixaban 2.5 MG Tabs tablet Commonly known as: ELIQUIS Take 1 tablet (2.5 mg total) by mouth 2 (two) times daily with a meal. What changed:   medication strength  how much to take  when to take this   arformoterol 15 MCG/2ML Nebu Commonly known as: BROVANA Take 2 mLs (15 mcg total) by nebulization 2 (two) times daily.   benzonatate 100 MG capsule Commonly known as: TESSALON Take 1 capsule (100 mg total) by mouth every 12 (twelve) hours as needed for cough.   carvedilol 25 MG tablet Commonly known as: COREG Take 1 tablet (25 mg total) by mouth 2 (two) times daily with a meal. What changed:   medication strength  how much to take  how to take this  when to take this  additional instructions   cholecalciferol 1000 units tablet Commonly known as:  VITAMIN D Take 1 tablet (1,000 Units total) by mouth daily. What changed: when to take this   clopidogrel 75 MG tablet Commonly known as: PLAVIX Take 1 tablet (75 mg total) by mouth daily. What changed: when to take this   ezetimibe 10 MG tablet Commonly known as: ZETIA Take 1 tablet (10 mg total) by mouth daily.   feeding supplement (ENSURE ENLIVE) Liqd Take 237 mLs by mouth 2 (two) times daily between meals. What changed: when to take this   Flutter Devi Use as directed   furosemide 20 MG tablet Commonly known as: LASIX Take 1 tablet (20 mg total) by mouth every Monday, Wednesday, and Friday.   losartan 50 MG tablet Commonly known as: COZAAR Take 1 tablet (50 mg total) by mouth daily. What changed: when to take this   MUCINEX PO Take 1,200 mg by mouth 2 (two) times daily.   multivitamin with minerals Tabs tablet Take 1 tablet by mouth daily.   nitroGLYCERIN 0.4 MG SL tablet Commonly known as: Nitrostat Place 1 tablet (0.4 mg total) under the tongue  every 5 (five) minutes as needed for chest pain.   pantoprazole 40 MG tablet Commonly known as: PROTONIX Take 1 tablet (40 mg total) by mouth daily. What changed: when to take this   Chocowinity Take 1 capsule by mouth daily.   Pitavastatin Calcium 2 MG Tabs Commonly known as: Livalo Take 1 tablet (2 mg total) by mouth See admin instructions. Every 3rd day What changed:   how much to take  when to take this  additional instructions   potassium chloride 10 MEQ CR capsule Commonly known as: MICRO-K Take 1 capsule (10 mEq total) by mouth every Monday, Wednesday, and Friday. **Take with lasix**   predniSONE 10 MG tablet Commonly known as: DELTASONE Take 3 tablets daily for 3 days then 2 tablets daily for 3 days then 1 tablet daily till done. What changed: additional instructions   sodium chloride HYPERTONIC 3 % nebulizer solution Once daily in the morning   Yupelri 175 MCG/3ML Soln Generic  drug: Revefenacin Inhale 1 vial into the lungs daily. DX: J43.8 What changed: additional instructions      Follow-up Information    [name], MD Follow up.   Specialty: Internal Medicine Contact information: [place] [address] [phone]        Leonie Man, MD .   Specialty: Cardiology Contact information: [address] [place] [address] [place] [address] [phone]          Allergies  Allergen Reactions  . Ciprofloxacin Other (See Comments)    Tingle feeling throughout body  . Tape Other (See Comments)    Redness, Please use "paper" tape    Consultations: none  Procedures/Studies: DG Chest 1 View  Result Date: 09/27/2019 CLINICAL DATA:  HYPOXEMIA, PREVIOUS ABNORMAL CHEST X-RAY EXAM: CHEST  1 VIEW COMPARISON:  09/24/2019 FINDINGS: SINGLE FRONTAL VIEW OF THE CHEST DEMONSTRATES A STABLE CARDIAC SILHOUETTE. STABLE INTERSTITIAL PROMINENCE WITHOUT FOCAL CONSOLIDATION, EFFUSION, OR PNEUMOTHORAX. NO ACUTE BONY ABNORMALITIES. IMPRESSION: 1. STABLE EXAM, NO ACUTE PROCESS. Electronically Signed   By: Randa Ngo M.D.   On: 09/27/2019 07:56   CT Chest High Resolution  Result Date: 09/12/2019 CLINICAL DATA:  86 year old male with history of interstitial lung disease. EXAM: CT CHEST WITHOUT CONTRAST TECHNIQUE: Multidetector CT imaging of the chest was performed following the standard protocol without intravenous contrast. High resolution imaging of the lungs, as well as inspiratory and expiratory imaging, was performed. COMPARISON:  Chest x-ray 09/18/2018. FINDINGS: Comment: Today's study is limited by considerable patient respiratory motion. Cardiovascular: Heart size is normal. There is no significant pericardial fluid, thickening or pericardial calcification. There is aortic atherosclerosis, as well as atherosclerosis of the great vessels of the mediastinum and the coronary arteries, including calcified atherosclerotic plaque in the left main,  left anterior descending, left circumflex and right coronary arteries. Calcifications of the mitral annulus. Mediastinum/Nodes: No pathologically enlarged mediastinal or hilar lymph nodes. Please note that accurate exclusion of hilar adenopathy is limited on noncontrast CT scans. Moderate-sized hiatal hernia. No axillary lymphadenopathy. Lungs/Pleura: Diffuse bronchial wall thickening with severe centrilobular and paraseptal emphysema. No definite suspicious appearing pulmonary nodules or masses are noted (today's study is limited by considerable patient respiratory motion). No definite acute consolidative airspace disease. No pleural effusions. High-resolution images demonstrate some scattered areas of peripheral predominant septal thickening and architectural distortion, most evident in the dependent portions of the lower lobes of the lungs bilaterally, poorly evaluated on today's motion limited examination. No frank honeycombing confidently identified. Considerable collapse of  the trachea and mainstem bronchi during expiratory phase imaging, indicative of tracheobronchomalacia. Upper Abdomen: Status post cholecystectomy.  Aortic atherosclerosis. Musculoskeletal: There are no aggressive appearing lytic or blastic lesions noted in the visualized portions of the skeleton. IMPRESSION: 1. Limited study which demonstrates areas of dependent septal thickening in the lower lobes of the lungs bilaterally which likely reflects developing interstitial lung disease. This is extremely poorly evaluated on today's examination secondary to respiratory motion. 2. Diffuse bronchial wall thickening with severe centrilobular and paraseptal emphysema; imaging findings suggestive of underlying COPD. 3. Aortic atherosclerosis, in addition to left main and 3 vessel coronary artery disease. 4. There are calcifications of the mitral annulus. Echocardiographic correlation for evaluation of potential valvular dysfunction may be warranted if  clinically indicated. 5. Moderate-sized hiatal hernia. Aortic Atherosclerosis (ICD10-I70.0) and Emphysema (ICD10-J43.9). Electronically Signed   By: Vinnie Langton M.D.   On: 09/12/2019 16:27   DG Chest Portable 1 View  Result Date: 09/24/2019 CLINICAL DATA:  Oxygen desaturation and patient with a history of COPD. EXAM: PORTABLE CHEST 1 VIEW COMPARISON:  PA and lateral chest 08/28/2019, 04/02/2019 and 08/24/2018. CT chest 09/12/2019. FINDINGS: The lungs are markedly emphysematous. There is bibasilar atelectasis. No consolidative process, pneumothorax or effusion. Heart size is normal. No acute or focal bony abnormality. IMPRESSION: Emphysema without acute disease. Electronically Signed   By: Inge Rise M.D.   On: 09/24/2019 11:11   DG Swallowing Func-Speech Pathology  Result Date: 09/26/2019 Objective Swallowing Evaluation: Type of Study: MBS-Modified Barium Swallow Study (02/2019)  Patient Details Name: DESIDERIO MOSKWA MRN: [id number] Date of Birth: [birth year] Today's Date: 09/26/2019 Time: SLP Start Time (ACUTE ONLY): 1330 -SLP Stop Time (ACUTE ONLY): 1350 SLP Time Calculation (min) (ACUTE ONLY): 20 min Past Medical History: Past Medical History: Diagnosis Date . Allergic rhinitis  . Anxiety  . Basal cell carcinoma of face  . Cardiomyopathy ([place]) 04/2015  EF 40-45% by echo. Apical anterior akinesis seen on echo, not confirmed on Myoview was negative for ischemia. Myoview suggested a small basal inferoseptal defect. . Cholecystitis   a. complex admit 04/2015 due to sepsis due to acute Escherichia coli cholecystitis and right lower lobe CAP and Citrobacter UTI. [name] Chronic respiratory failure ([place])   a. on home O2 after discharge 04/2015. [name] Chronic systolic CHF (congestive heart failure) ([place])   a. Dx 04/2015 during complex admission for cholecystitis - EF 40-45% +WMA. [name] COPD (chronic obstructive pulmonary disease) ([place])  . Erectile dysfunction  . History of endovascular stent graft for abdominal aortic aneurysm  2002  a. s/p repair  Followed by Dr. Donnetta Hutching (stable evaluation 01/2106) . History of hiatal hernia  . HTN (hypertension)  . Hyperlipidemia  . Interstitial lung disease ([place])   a. followed by pulm. [name] LBBB (left bundle branch block)   a. Intermittent - seen in 2013, not in 09/2013, but again in 04/2015. . Obesity  . On home oxygen therapy   "just at night" (10/05/2015) . PVD (peripheral vascular disease) ([place])  . Shingles  . Stroke [facility])   a. H/o aphasia - was told he'd had ministrokes. Imaging 09/2014 revealed prior infarct. Past Surgical History: Past Surgical History: Procedure Laterality Date . AORTA SURGERY  2002 . CHOLECYSTECTOMY N/A 10/05/2015  Procedure: LAPAROSCOPIC CHOLECYSTECTOMY  ;  Surgeon: Autumn Messing III, MD;  Location: [place];  Service: General;  Laterality: N/A; . [place] . INTRAOPERATIVE CHOLANGIOGRAM  10/05/2015  Procedure: INTRAOPERATIVE CHOLANGIOGRAM;  Surgeon: Autumn Messing III, MD;  Location: [place];  Service: General;; . LAPAROSCOPIC CHOLECYSTECTOMY  10/05/2015 . NM MYOVIEW LTD  05/2015  EF 49%. Small size, mild severity perfusion defect in the basal-mid inferoseptal wall. LOW RISK. No ischemia. Defect thought to be related to LBBB artifacts and not true lesion. This could also explain mild systolic dysfunction. . TRANSTHORACIC ECHOCARDIOGRAM  04/2015  (In setting of acute illness).EF 40-45% with mid-apical anteroseptal akinesis. Moderately increased PA pressures of roughly 48 mmHg. [name] URETHRAL STRICTURE DILATATION  late 1970s  urinary tract stretch HPI: CORTLEN DUBS is a 86 y.o. male with medical history significant of chronic hypoxic respiratory failure on home oxygen, COPD, chronic interstitial lung disease, cardiomyopathy, hypertension hyperlipidemia, presented with fever and increasing short of breath, fever. Per chart recent treatment for COPD exacerbation about 1 week ago Patient has chronic cough. CXR Emphysema without acute disease. MBS 02/2019 delayed swallow and mild residue. No  penetration or aspiration.  No data recorded Assessment / Plan / Recommendation CHL IP CLINICAL IMPRESSIONS 09/26/2019 Clinical Impression Pt's swallow function has deteriorated mildly since 02/2019 with + laryngeal penetration of thin liquids. Thin barium reached his valleculae and pyriform sinuses sitting for several seconds longer than typical and full epiglottic inversion not reached resulting in penetration of thin liquids mostly flash (ejected from laryngeal vestibule). One instance of vocal cord level penetration resulting in strong cough. Per chart review his health has been declining and noted daughter wishes for Palliative care involvement. Per MBS in July wife noted pt had been coughing. Recommend he continue nectar thick liquids and Dys 3 texture ( chopped meats). Once home if pt requesting regular water, a few sips after oral care should be fine or if Palliative decision may be made to fully liberalize his diet.   SLP Visit Diagnosis Dysphagia, oropharyngeal phase (R13.12) Attention and concentration deficit following -- Frontal lobe and executive function deficit following -- Impact on safety and function Moderate aspiration risk   CHL IP TREATMENT RECOMMENDATION 09/26/2019 Treatment Recommendations Therapy as outlined in treatment plan below   Prognosis 09/26/2019 Prognosis for Safe Diet Advancement Fair Barriers to Reach Goals Cognitive deficits Barriers/Prognosis Comment -- CHL IP DIET RECOMMENDATION 09/26/2019 SLP Diet Recommendations Dysphagia 3 (Mech soft) solids;Nectar thick liquid Liquid Administration via Cup;Straw Medication Administration Crushed with puree Compensations Slow rate;Small sips/bites;Minimize environmental distractions;Lingual sweep for clearance of pocketing Postural Changes Seated upright at 90 degrees   CHL IP OTHER RECOMMENDATIONS 09/26/2019 Recommended Consults -- Oral Care Recommendations Oral care BID Other Recommendations --   CHL IP FOLLOW UP RECOMMENDATIONS 09/26/2019 Follow up  Recommendations Skilled Nursing facility   [facility] IP FREQUENCY AND DURATION 09/26/2019 Speech Therapy Frequency (ACUTE ONLY) min 2x/week Treatment Duration 2 weeks      CHL IP ORAL PHASE 09/26/2019 Oral Phase Impaired Oral - Pudding Teaspoon -- Oral - Pudding Cup -- Oral - Honey Teaspoon -- Oral - Honey Cup -- Oral - Nectar Teaspoon -- Oral - Nectar Cup Decreased bolus cohesion Oral - Nectar Straw -- Oral - Thin Teaspoon -- Oral - Thin Cup Decreased bolus cohesion Oral - Thin Straw Decreased bolus cohesion Oral - Puree -- Oral - Mech Soft -- Oral - Regular Reduced posterior propulsion Oral - Multi-Consistency -- Oral - Pill -- Oral Phase - Comment --  CHL IP PHARYNGEAL PHASE 09/26/2019 Pharyngeal Phase Impaired Pharyngeal- Pudding Teaspoon -- Pharyngeal -- Pharyngeal- Pudding Cup -- Pharyngeal -- Pharyngeal- Honey Teaspoon -- Pharyngeal -- Pharyngeal- Honey Cup -- Pharyngeal -- Pharyngeal- Nectar Teaspoon -- Pharyngeal -- Pharyngeal- Nectar Cup [place] Pharyngeal Material  does not enter airway Pharyngeal- Nectar Straw -- Pharyngeal -- Pharyngeal- Thin Teaspoon -- Pharyngeal -- Pharyngeal- Thin Cup Delayed swallow initiation-pyriform sinuses;Delayed swallow initiation-vallecula;Penetration/Aspiration during swallow Pharyngeal Material enters airway, remains ABOVE vocal cords then ejected out;Material enters airway, remains ABOVE vocal cords and not ejected out Pharyngeal- Thin Straw Penetration/Aspiration during swallow Pharyngeal Material enters airway, CONTACTS cords and then ejected out Pharyngeal- Puree -- Pharyngeal -- Pharyngeal- Mechanical Soft -- Pharyngeal -- Pharyngeal- Regular WFL Pharyngeal -- Pharyngeal- Multi-consistency -- Pharyngeal -- Pharyngeal- Pill -- Pharyngeal -- Pharyngeal Comment --  CHL IP CERVICAL ESOPHAGEAL PHASE 09/26/2019 Cervical Esophageal Phase WFL Pudding Teaspoon -- Pudding Cup -- Honey Teaspoon -- Honey Cup -- Nectar Teaspoon -- Nectar Cup -- Nectar Straw -- Thin Teaspoon -- Thin Cup -- Thin Straw  -- Puree -- Mechanical Soft -- Regular -- Multi-consistency -- Pill -- Cervical Esophageal Comment -- [name] 09/26/2019, 2:41 PM [name] Litaker M.Ed Risk analyst [phone] Office [phone]               (Echo, Carotid, EGD, Colonoscopy, ERCP)    Subjective:  He is awake alert sitting up in bed staff trying to give him his meds crushed in applesauce he appears in no acute distress he is not tachycardic or tachypneic  Discharge Exam: Vitals:   09/28/19 0735 09/28/19 0743  BP:  (!) 171/83  Pulse:  69  Resp:  16  Temp:  97.7 F (36.5 C)  SpO2: 98% 94%   Vitals:   09/27/19 2143 09/27/19 2303 09/28/19 0735 09/28/19 0743  BP: (!) 128/59   (!) 171/83  Pulse: 64   69  Resp:    16  Temp: (!) 97.2 F (36.2 C)   97.7 F (36.5 C)  TempSrc: Oral     SpO2: 97%  98% 94%  Weight:  98.7 kg      General: Pt is alert, awake, not in acute distress Cardiovascular: RRR, S1/S2 +, no rubs, no gallops Respiratory: Few scattered rhonchi bilaterally, no wheezing, no rhonchi Abdominal: Soft, NT, ND, bowel sounds + Extremities: no edema, no cyanosis    The results of significant diagnostics from this hospitalization (including imaging, microbiology, ancillary and laboratory) are listed below for reference.     Microbiology: Recent Results (from the past 240 hour(s))  Respiratory Panel by RT PCR (Flu A&B, Covid) - Nasopharyngeal Swab     Status: None   Collection Time: 09/24/19 10:31 AM   Specimen: Nasopharyngeal Swab  Result Value Ref Range Status   SARS Coronavirus 2 by RT PCR NEGATIVE NEGATIVE Final    Comment: (NOTE) SARS-CoV-2 target nucleic acids are NOT DETECTED. The SARS-CoV-2 RNA is generally detectable in upper respiratoy specimens during the acute phase of infection. The lowest concentration of SARS-CoV-2 viral copies this assay can detect is 131 copies/mL. A negative result does not preclude SARS-Cov-2 infection and should not be used  as the sole basis for treatment or other patient management decisions. A negative result may occur with  improper specimen collection/handling, submission of specimen other than nasopharyngeal swab, presence of viral mutation(s) within the areas targeted by this assay, and inadequate number of viral copies (<131 copies/mL). A negative result must be combined with clinical observations, patient history, and epidemiological information. The expected result is Negative. Fact Sheet for Patients:  PinkCheek.be Fact Sheet for Healthcare Providers:  GravelBags.it This test is not yet ap proved or cleared by the [place] FDA and  has been authorized for detection and/or diagnosis  of SARS-CoV-2 by FDA under an Emergency Use Authorization (EUA). This EUA will remain  in effect (meaning this test can be used) for the duration of the COVID-19 declaration under Section 564(b)(1) of the Act, 21 U.S.C. section 360bbb-3(b)(1), unless the authorization is terminated or revoked sooner.    Influenza A by PCR NEGATIVE NEGATIVE Final   Influenza B by PCR NEGATIVE NEGATIVE Final    Comment: (NOTE) The Xpert Xpress SARS-CoV-2/FLU/RSV assay is intended as an aid in  the diagnosis of influenza from Nasopharyngeal swab specimens and  should not be used as a sole basis for treatment. Nasal washings and  aspirates are unacceptable for Xpert Xpress SARS-CoV-2/FLU/RSV  testing. Fact Sheet for Patients: PinkCheek.be Fact Sheet for Healthcare Providers: GravelBags.it This test is not yet approved or cleared by the [place] FDA and  has been authorized for detection and/or diagnosis of SARS-CoV-2 by  FDA under an Emergency Use Authorization (EUA). This EUA will remain  in effect (meaning this test can be used) for the duration of the  Covid-19 declaration under Section 564(b)(1) of the Act,  21  U.S.C. section 360bbb-3(b)(1), unless the authorization is  terminated or revoked. Performed at [facility], [place] [address], [place], [place] [address]   Blood culture (routine x 2)     Status: None (Preliminary result)   Collection Time: 09/24/19 11:08 AM   Specimen: BLOOD RIGHT ARM  Result Value Ref Range Status   Specimen Description BLOOD RIGHT ARM  Final   Special Requests   Final    BOTTLES DRAWN AEROBIC AND ANAEROBIC Blood Culture adequate volume   Culture   Final    NO GROWTH 4 DAYS Performed at [facility], [place] [address], [place], [place] [address]    Report Status PENDING  Incomplete  Blood culture (routine x 2)     Status: None (Preliminary result)   Collection Time: 09/24/19 11:08 AM   Specimen: BLOOD  Result Value Ref Range Status   Specimen Description BLOOD LEFT ANTECUBITAL  Final   Special Requests   Final    BOTTLES DRAWN AEROBIC ONLY Blood Culture results may not be optimal due to an inadequate volume of blood received in culture bottles   Culture   Final    NO GROWTH 4 DAYS Performed at [facility], [place] [address] [place], [place] [address]    Report Status PENDING  Incomplete     Labs: BNP (last 3 results) Recent Labs    09/24/19 1045  BNP [id number]   Basic Metabolic Panel: Recent Labs  Lab 09/24/19 1044 09/26/19 1124 09/27/19 1129  NA 140 138 137  K 4.2 4.0 5.7*  CL 108 108 106  CO2 23 22 22   GLUCOSE 96 183* 130*  BUN 16 21 20   CREATININE 1.05 1.05 1.01  CALCIUM 9.1 8.9 8.8*   Liver Function Tests: Recent Labs  Lab 09/24/19 1044 09/26/19 1124  AST 23 24  ALT 25 23  ALKPHOS 58 51  BILITOT 0.6 0.8  PROT 6.5 5.7*  ALBUMIN 3.1* 2.9*   Recent Labs  Lab 09/24/19 1044  LIPASE 28   No results for input(s): AMMONIA in the last 168 hours. CBC: Recent Labs  Lab 09/24/19 1044 09/25/19 0252 09/26/19 1124 09/27/19 1214  WBC 14.1* 14.1* 18.8* 17.5*  NEUTROABS 10.3*  --  17.0*  --   HGB 14.1 13.4 13.5 13.4   HCT 43.8 39.6 39.2 39.6  MCV 98.0 93.6 92.9 94.5  PLT 223 243 230 250   Cardiac Enzymes: No results for input(s): CKTOTAL, CKMB, CKMBINDEX, TROPONINI in the last 168 hours. BNP: Invalid input(s): POCBNP CBG: No results for input(s): GLUCAP in the last 168 hours. D-Dimer No results for input(s): DDIMER in the last 72 hours. Hgb A1c No results for input(s): HGBA1C in the last 72 hours. Lipid Profile No results for input(s): CHOL, HDL, LDLCALC, TRIG, CHOLHDL, LDLDIRECT in the last 72 hours. Thyroid function studies No results for input(s): TSH, T4TOTAL, T3FREE, THYROIDAB in the last 72 hours.  Invalid input(s): FREET3 Anemia work up No results for input(s): VITAMINB12, FOLATE, FERRITIN, TIBC, IRON, RETICCTPCT in the last 72 hours. Urinalysis    Component Value Date/Time   COLORURINE STRAW (A) 09/24/2019 1400   APPEARANCEUR CLEAR 09/24/2019 1400   LABSPEC 1.009 09/24/2019 1400   PHURINE 5.0 09/24/2019 1400   GLUCOSEU NEGATIVE 09/24/2019 1400   HGBUR NEGATIVE 09/24/2019 1400   BILIRUBINUR NEGATIVE 09/24/2019 1400   KETONESUR 5 (A) 09/24/2019 1400   PROTEINUR NEGATIVE 09/24/2019 1400   UROBILINOGEN 1.0 04/30/2015 2123   NITRITE NEGATIVE 09/24/2019 1400   LEUKOCYTESUR SMALL (A) 09/24/2019 1400   Sepsis Labs Invalid input(s): PROCALCITONIN,  WBC,  LACTICIDVEN Microbiology Recent Results (from the past 240 hour(s))  Respiratory Panel by RT PCR (Flu A&B, Covid) - Nasopharyngeal Swab     Status: None   Collection Time: 09/24/19 10:31 AM   Specimen: Nasopharyngeal Swab  Result Value Ref Range Status   SARS Coronavirus 2 by RT PCR NEGATIVE NEGATIVE Final    Comment: (NOTE) SARS-CoV-2 target nucleic acids are NOT DETECTED. The SARS-CoV-2 RNA is generally detectable in upper respiratoy specimens during the acute phase of infection. The lowest concentration of SARS-CoV-2 viral copies this assay can detect is 131 copies/mL. A negative result does not preclude  SARS-Cov-2 infection and should not be used as the sole basis for treatment or other patient management decisions. A negative result may occur with  improper specimen collection/handling, submission of specimen other than nasopharyngeal swab, presence of viral mutation(s) within the areas targeted by this assay, and inadequate number of viral copies (<131 copies/mL). A negative result must be combined with clinical observations, patient history, and epidemiological information. The expected result is Negative. Fact Sheet for Patients:  PinkCheek.be Fact Sheet for Healthcare Providers:  GravelBags.it This test is not yet ap proved or cleared by the [place] FDA and  has been authorized for detection and/or diagnosis of SARS-CoV-2 by FDA under an Emergency Use Authorization (EUA). This EUA will remain  in effect (meaning this test can be used) for the duration of the COVID-19 declaration under Section 564(b)(1) of the Act, 21 U.S.C. section 360bbb-3(b)(1), unless the authorization is terminated or revoked sooner.    Influenza A by PCR NEGATIVE NEGATIVE Final   Influenza B by PCR NEGATIVE NEGATIVE Final    Comment: (NOTE) The Xpert Xpress SARS-CoV-2/FLU/RSV assay is intended as an aid in  the diagnosis of influenza from Nasopharyngeal swab specimens and  should not be used as a sole basis for treatment. Nasal washings and  aspirates are unacceptable for Xpert Xpress SARS-CoV-2/FLU/RSV  testing. Fact Sheet for Patients: PinkCheek.be Fact Sheet for Healthcare Providers: GravelBags.it This test is not yet approved or cleared by the [place] FDA and  has been authorized for detection and/or diagnosis of SARS-CoV-2 by  FDA under an Emergency Use Authorization (EUA). This EUA will remain  in effect (meaning this test can be used) for the duration of the  Covid-19  declaration under Section 564(b)(1) of the Act, 21  U.S.C. section 360bbb-3(b)(1), unless the authorization is  terminated or revoked. Performed at [facility], [place] [address], [place], [place] [address]   Blood culture (routine x 2)     Status: None (Preliminary result)   Collection Time: 09/24/19 11:08 AM   Specimen: BLOOD RIGHT ARM  Result Value Ref Range Status   Specimen Description BLOOD RIGHT ARM  Final   Special Requests   Final    BOTTLES DRAWN AEROBIC AND ANAEROBIC Blood Culture adequate volume   Culture   Final    NO GROWTH 4 DAYS Performed at [facility], [place] [address] [place], [place] [address]    Report Status PENDING  Incomplete  Blood culture (routine x 2)     Status: None (Preliminary result)   Collection Time: 09/24/19 11:08 AM   Specimen: BLOOD  Result Value Ref Range Status   Specimen Description BLOOD LEFT ANTECUBITAL  Final   Special Requests   Final    BOTTLES DRAWN AEROBIC ONLY Blood Culture results may not be optimal due to an inadequate volume of blood received in culture bottles   Culture   Final    NO GROWTH 4 DAYS Performed at [facility], [place] [address] [place], [place] [address]    Report Status PENDING  Incomplete     Time coordinating discharge:  39 minutes  SIGNED:   Georgette Shell, MD  [facility] 09/28/2019, 11:23 AM Pager   If 7PM-7AM, please contact night-coverage [email or web address] Password [email or web address]

## 2019-09-28 NOTE — Plan of Care (Signed)
  Problem: Respiratory: Goal: Ability to maintain a clear airway will improve Outcome: Adequate for Discharge   Problem: Respiratory: Goal: Levels of oxygenation will improve Outcome: Adequate for Discharge   Problem: Respiratory: Goal: Ability to maintain adequate ventilation will improve Outcome: Adequate for Discharge

## 2019-09-28 NOTE — Care Management (Signed)
Verified all equipment delivered to home w daughter, and plan is for DC via private car. Daughter has transportation tanks for O2. Notified [facility] that patient will DC today.

## 2019-09-29 LAB — CULTURE, BLOOD (ROUTINE X 2)
Culture: NO GROWTH
Culture: NO GROWTH
Special Requests: ADEQUATE

## 2019-09-30 ENCOUNTER — Telehealth: Payer: Self-pay | Admitting: Cardiology

## 2019-09-30 NOTE — Telephone Encounter (Signed)
New message:    Patient wife calling stating the patient was in the hospital. They are concerned about the changes and would like for some one to call and explain. Please call patient wife.

## 2019-09-30 NOTE — Telephone Encounter (Signed)
New Message   FYI: Verne Spurr RN with [facility] is calling to just notify that the patient is on home hospice care. The patients family will be bringing the patient to his visits but they wanted the Dr. Ellyn Hack to be aware. Dr. Judd Lien with [facility] is the provider for patient.

## 2019-09-30 NOTE — Telephone Encounter (Signed)
LMTCB

## 2019-10-02 NOTE — Telephone Encounter (Signed)
Thank you :)

## 2019-10-03 NOTE — Telephone Encounter (Signed)
Wife of patient called back returning Ann's call from Monday. Wife wanted to know about the increased dosage of the carvedilol. The patient was previously taking 12.5 mg bid but has increased to 25 mg bid. She just wants to make sure he is getting the correct dose

## 2019-10-04 NOTE — Telephone Encounter (Signed)
Spoke to wife. About another matter ( see telephone note 09/30/19)  wife states Patient 's blood pressure  has been 104/64 pulse 70  Today , yesterday 132/69 pulse 66.  she states she has been giving him 12.5 mg( 1/2 tablet of 25 mg)    carvedilol twice a day since leaving the hospital . She states she did not want give him to much medication because it was increase, without the okay from  Dr Ellyn Hack. She wanted to know why it was increased .  RN informed wife by reviewing summary --  Patient's blood pressure was elevated and heart rate elevated.   RN instructed  wife to continue with 12.5 mg carvedilol twice a day until  March visit.  will  Defer to Dr Ellyn Hack for any further instructions

## 2019-10-04 NOTE — Telephone Encounter (Signed)
Spoke with patient's wife  Offered to switch appointment to a virtual day if  Preferably. Mrs Du would prefer for patient to come to office for an in person visit.   aware she may call  And reschedule for a virtual visit for another day other than 10/23/19 if needed.

## 2019-10-04 NOTE — Telephone Encounter (Signed)
Agree.  Would not have increased his dose of carvedilol.  Continue with 1/2 tablet twice daily  Glenetta Hew, MD

## 2019-10-21 NOTE — Telephone Encounter (Signed)
MR please advise on pt email.  Thanks.   Dr. Chase Caller,  I am Selena Lesser private duty nurse. I wanted to reach out to alert you that he does have some fluid on his lungs upon auscultation (upper + lower lobes). We have not given more Lasix because his leg/ankle isn't anymore swollen than normal. I believe it is more of an aspiration issue.   Just wanted to update you, we do go to Dr Ellyn Hack Wednesday 3/3   [name]

## 2019-10-23 ENCOUNTER — Encounter: Payer: Self-pay | Admitting: Cardiology

## 2019-10-23 ENCOUNTER — Other Ambulatory Visit: Payer: Self-pay

## 2019-10-23 ENCOUNTER — Ambulatory Visit: Payer: Medicare Other | Admitting: Cardiology

## 2019-10-23 VITALS — BP 130/64 | HR 72 | Ht 66.0 in | Wt 219.4 lb

## 2019-10-23 DIAGNOSIS — I2699 Other pulmonary embolism without acute cor pulmonale: Secondary | ICD-10-CM

## 2019-10-23 DIAGNOSIS — I428 Other cardiomyopathies: Secondary | ICD-10-CM | POA: Diagnosis not present

## 2019-10-23 DIAGNOSIS — E785 Hyperlipidemia, unspecified: Secondary | ICD-10-CM

## 2019-10-23 DIAGNOSIS — I110 Hypertensive heart disease with heart failure: Secondary | ICD-10-CM

## 2019-10-23 DIAGNOSIS — R5381 Other malaise: Secondary | ICD-10-CM

## 2019-10-23 DIAGNOSIS — Z7189 Other specified counseling: Secondary | ICD-10-CM

## 2019-10-23 DIAGNOSIS — J84112 Idiopathic pulmonary fibrosis: Secondary | ICD-10-CM

## 2019-10-23 DIAGNOSIS — I10 Essential (primary) hypertension: Secondary | ICD-10-CM

## 2019-10-23 DIAGNOSIS — I5032 Chronic diastolic (congestive) heart failure: Secondary | ICD-10-CM

## 2019-10-23 NOTE — Progress Notes (Signed)
Primary Care Provider: [name], MD Cardiologist: Glenetta Hew, MD Electrophysiologist: None  Clinic Note: No chief complaint on file.   HPI:    Lee [name] is a 86 y.o. male with a PMH notable for mild nonischemic cardiomyopathy with level mesh block, AAA (status post repair), and oxygen dependent COPD with pulmonary fibrosis, CVA along with hypertension hyperlipidemia who presents today for hospital follow-up after recent hospitalization for hypoxia.Lee [name] also has pretty significant cognitive delay now and is relatively nonverbal.  He is rarely active.  Is mostly wheelchair-bound.  Maybe gets around a little bit of the walker at home.  Lee [name] was last seen by me on July 29 2019-he himself was not providing any history or information.  His wife was noting a lot of more frequent wheezing since a recent episode of pneumonia.  Was only taking Lasix maybe 3 days a week.  Unable to really get a good story.  No evidence of chest pain or active heart failure. -> Was concerned about 12 pound weight gain so increase his Lasix.  He was seen by Almyra Deforest, PA on August 20, 2019 following a visit with pulmonary medicine on 08/14/2019 for worsening wheezing.  He was treated for pneumonia with Augmentin and prednisone.  They noted that he had reduced back down to 3 times a week edema and weight was stable. -->  No changes made.  Recent Hospitalizations:  Admitted from February 2-6/20/2021 -> fever and increased shortness of breath.  Started having significant fevers after initially improving with the antibiotics given by pulmonary medicine.  Was noted to be hypoxic with oxygen saturation in the 70s.  Improved to 95% on 2 L prior to discharge.--Covid negative.  D-dimer normal.  Was treated with Rocephin and doxycycline along with Solu-Medrol.  Continued home inhalers.  Carvedilol dose was increased because of persistent hypertension and tachycardia.  Lasix was placed on hold.   Continued on reduced dose Eliquis.  Was listed as being discharged home with home hospice but still full code.  Was on nectar thick liquids.  Difficult dysphagia 3 diet.  Reviewed  CV studies:    The following studies were reviewed today: (if available, images/films reviewed: From Epic Chart or Care Everywhere) . None:   Interval History:   Lee [name] is here today with his wife to follow-up from recent hospitalization.  He seems to have recovered okay from that hospital stay, but still has profound weakness and debilitation.  He is really nonverbal.  Max assist now even requiring help with eating.  Cannot transfer alone.  It sounds as though he may be aspirating until he cannot really swallow beverages.  They are moving toward thickened beverages.  At baseline his breathing is coarse and irregular, but he is also noting purulent orthopnea sleeping about 30 degrees propped up more because of concern for aspiration then for 2 orthopnea.  Stable baseline edema.  Not real that worse.  CV Review of Symptoms (Summary) Cardiovascular ROS: positive for - dyspnea on exertion, edema, orthopnea, shortness of breath and Profound weakness negative for - chest pain, irregular heartbeat, loss of consciousness, palpitations, paroxysmal nocturnal dyspnea, rapid heart rate or Syncope/near syncope, TIA/amaurosis fugax.  Does not walk, therefore no claudication  The patient does have symptoms concerning for COVID-19 infection (fever, chills, cough, or new shortness of breath).  Has been tested recently and was negative.  They were told that he would need to wait for a few months before he  gets his COVID-19 vaccine after his recent hospitalization.  His wife is already had her vaccines. The patient is practicing social distancing & Masking.    REVIEWED OF SYSTEMS    Review of Systems  Constitutional: Positive for malaise/fatigue (Gets worn out very easily.).       In general he is a max assist.  Needs  help even when eating.  Needs help with transfer from chair to bed etc.  HENT: Negative for nosebleeds.   Respiratory: Positive for cough, shortness of breath and wheezing. Negative for sputum production.        Probably aspirates as he coughs after drinking.  Cardiovascular: Positive for orthopnea and leg swelling.  Gastrointestinal: Positive for constipation. Negative for abdominal pain, blood in stool and melena.  Genitourinary: Negative for hematuria.  Musculoskeletal: Negative for falls.       Postural dizziness and weakness; very unsteady gait.  Neurological: Positive for weakness (Generalized.).  Psychiatric/Behavioral: Positive for memory loss. The patient is nervous/anxious.    I have reviewed and (if needed) personally updated the patient's problem list, medications, allergies, past medical and surgical history, social and family history.   PAST MEDICAL HISTORY   Past Medical History:  Diagnosis Date  . Allergic rhinitis   . Anxiety   . Basal cell carcinoma of face   . Cardiomyopathy ([place]) 04/2015   EF 40-45% by echo. Apical anterior akinesis seen on echo, not confirmed on Myoview was negative for ischemia. Myoview suggested a small basal inferoseptal defect.  . Cholecystitis    a. complex admit 04/2015 due to sepsis due to acute Escherichia coli cholecystitis and right lower lobe CAP and Citrobacter UTI.  [name] Chronic respiratory failure ([place])    a. on home O2 after discharge 04/2015.  [name] Chronic systolic CHF (congestive heart failure) ([place])    a. Dx 04/2015 during complex admission for cholecystitis - EF 40-45% +WMA.  [name] COPD (chronic obstructive pulmonary disease) ([place])   . Erectile dysfunction   . History of endovascular stent graft for abdominal aortic aneurysm 2002   a. s/p repair  Followed by Dr. Donnetta Hutching (stable evaluation 01/2106)  . History of hiatal hernia   . HTN (hypertension)   . Hyperlipidemia   . Interstitial lung disease ([place])    a. followed by pulm.  [name] LBBB (left  bundle branch block)    a. Intermittent - seen in 2013, not in 09/2013, but again in 04/2015.  . Obesity   . On home oxygen therapy    "just at night" (10/05/2015)  . PVD (peripheral vascular disease) ([place])   . Shingles   . Stroke [facility])    a. H/o aphasia - was told he'd had ministrokes. Imaging 09/2014 revealed prior infarct.    PAST SURGICAL HISTORY   Past Surgical History:  Procedure Laterality Date  . AORTA SURGERY  2002  . CHOLECYSTECTOMY N/A 10/05/2015   Procedure: LAPAROSCOPIC CHOLECYSTECTOMY  ;  Surgeon: Autumn Messing III, MD;  Location: [place];  Service: General;  Laterality: N/A;  . [place]  . INTRAOPERATIVE CHOLANGIOGRAM  10/05/2015   Procedure: INTRAOPERATIVE CHOLANGIOGRAM;  Surgeon: Autumn Messing III, MD;  Location: [place];  Service: General;;  . LAPAROSCOPIC CHOLECYSTECTOMY  10/05/2015  . NM MYOVIEW LTD  05/2015   EF 49%. Small size, mild severity perfusion defect in the basal-mid inferoseptal wall. LOW RISK. No ischemia. Defect thought to be related to LBBB artifacts and not true lesion. This could also explain mild systolic dysfunction.  [name]  TRANSTHORACIC ECHOCARDIOGRAM  04/2015   (In setting of acute illness).EF 40-45% with mid-apical anteroseptal akinesis. Moderately increased PA pressures of roughly 48 mmHg.  [name] URETHRAL STRICTURE DILATATION  late 1970s   urinary tract stretch    MEDICATIONS/ALLERGIES   Current Meds  Medication Sig  . acetaminophen (TYLENOL) 325 MG tablet Take 2 tablets (650 mg total) by mouth every 6 (six) hours as needed for moderate pain.  [name] albuterol (PROVENTIL HFA;VENTOLIN HFA) 108 (90 Base) MCG/ACT inhaler Inhale 2 puffs into the lungs every 6 (six) hours as needed for wheezing or shortness of breath.  [name] albuterol (PROVENTIL) (2.5 MG/3ML) 0.083% nebulizer solution Take 3 mLs (2.5 mg total) by nebulization 2 (two) times daily.  [name] apixaban (ELIQUIS) 2.5 MG TABS tablet Take 1 tablet (2.5 mg total) by mouth 2 (two) times daily with a meal.  .  arformoterol (BROVANA) 15 MCG/2ML NEBU Take 2 mLs (15 mcg total) by nebulization 2 (two) times daily.  . benzonatate (TESSALON) 100 MG capsule Take 1 capsule (100 mg total) by mouth every 12 (twelve) hours as needed for cough.  . carvedilol (COREG) 25 MG tablet Take 1 tablet (25 mg total) by mouth 2 (two) times daily with a meal.  . cholecalciferol (VITAMIN D) 1000 units tablet Take 1 tablet (1,000 Units total) by mouth daily. (Patient taking differently: Take 1,000 Units by mouth daily with supper. )  . clopidogrel (PLAVIX) 75 MG tablet Take 1 tablet (75 mg total) by mouth daily. (Patient taking differently: Take 75 mg by mouth daily at 3 pm. )  . ezetimibe (ZETIA) 10 MG tablet Take 1 tablet (10 mg total) by mouth daily.  . feeding supplement, ENSURE ENLIVE, (ENSURE ENLIVE) LIQD Take 237 mLs by mouth 2 (two) times daily between meals. (Patient taking differently: Take 237 mLs by mouth daily at 3 pm. )  . furosemide (LASIX) 20 MG tablet Take 1 tablet (20 mg total) by mouth every Monday, Wednesday, and Friday.  [name] guaiFENesin (MUCINEX PO) Take 1,200 mg by mouth 2 (two) times daily.  [name] LORazepam (ATIVAN) 0.5 MG tablet Take 0.5 mg by mouth every 8 (eight) hours.  [name] losartan (COZAAR) 50 MG tablet Take 1 tablet (50 mg total) by mouth daily. (Patient taking differently: Take 50 mg by mouth every evening. )  . Multiple Vitamin (MULTIVITAMIN WITH MINERALS) TABS tablet Take 1 tablet by mouth daily.  . nitroGLYCERIN (NITROSTAT) 0.4 MG SL tablet Place 1 tablet (0.4 mg total) under the tongue every 5 (five) minutes as needed for chest pain.  . pantoprazole (PROTONIX) 40 MG tablet Take 1 tablet (40 mg total) by mouth daily. (Patient taking differently: Take 40 mg by mouth daily before breakfast. )  . Pitavastatin Calcium (LIVALO) 2 MG TABS Take 1 tablet (2 mg total) by mouth See admin instructions. Every 3rd day (Patient taking differently: Take 1 mg by mouth every 3 (three) days. )  . potassium chloride (MICRO-K)  10 MEQ CR capsule Take 1 capsule (10 mEq total) by mouth every Monday, Wednesday, and Friday. **Take with lasix**  . predniSONE (DELTASONE) 10 MG tablet Take 3 tablets daily for 3 days then 2 tablets daily for 3 days then 1 tablet daily till done.  . Probiotic Product ([place]) CAPS Take 1 capsule by mouth daily.  [name] Respiratory Therapy Supplies (FLUTTER) DEVI Use as directed  . Revefenacin (YUPELRI) 175 MCG/3ML SOLN Inhale 1 vial into the lungs daily. DX: J43.8  . sodium chloride HYPERTONIC 3 % nebulizer solution  Once daily in the morning    Allergies  Allergen Reactions  . Ciprofloxacin Other (See Comments)    Tingle feeling throughout body  . Tape Other (See Comments)    Redness, Please use "paper" tape    SOCIAL HISTORY/FAMILY HISTORY   Reviewed in Epic:  Pertinent findings: No new changes.  OBJCTIVE -PE, EKG, labs   Wt Readings from Last 3 Encounters:  10/23/19 219 lb 6.4 oz (99.5 kg)  09/27/19 217 lb 9.5 oz (98.7 kg)  09/10/19 215 lb (97.5 kg)    Physical Exam: BP 130/64   Pulse 72   Ht 5\' 6"  (1.676 m)   Wt 219 lb 6.4 oz (99.5 kg)   SpO2 94%   BMI 35.41 kg/m  Physical Exam  Constitutional: No distress.  Nontoxic, chronically ill-appearing gentleman sitting in a wheelchair.  Not very interactive.  Almost nonverbal.  Does not answer questions besides him saying "yeah"  HENT:  Head: Normocephalic and atraumatic.  Neck: No hepatojugular reflux and no JVD present. Carotid bruit is not present.  Cardiovascular: Normal rate, regular rhythm, S1 normal and S2 normal.  Occasional extrasystoles are present. PMI is not displaced. Exam reveals distant heart sounds and decreased pulses (Mildly decreased pedal pulses). Exam reveals no gallop and no friction rub.  No murmur heard. Pulmonary/Chest: Effort normal. No respiratory distress. He has wheezes. He has no rales. He exhibits no tenderness.  Diffuse coarse rhonchorous breath sounds with inspiratory and expiratory  stridor  Musculoskeletal:        General: Edema (1-2+ generalized edema) present.  Neurological: He is alert.  Appears to be oriented to his name  Psychiatric:  Nonverbal.  Smiling and pleasant  Vitals reviewed.    Adult ECG Report Not checked  Recent Labs:    No results found for: CHOL, HDL, LDLCALC, LDLDIRECT, TRIG, CHOLHDL Lab Results  Component Value Date   CREATININE 1.01 09/27/2019   BUN 20 09/27/2019   NA 137 09/27/2019   K 5.7 (H) 09/27/2019   CL 106 09/27/2019   CO2 22 09/27/2019   Lab Results  Component Value Date   TSH 0.878 05/01/2015    ASSESSMENT/PLAN    Problem List Items Addressed This Visit    Hypertensive heart disease with CHF (mild HFpEF) - Primary (Chronic)    Is really hard to tell how much a component of his dyspnea and breath sounds are related to heart versus his underlying lung disease.  Certainly he did benefit from being on the dry side.  Provided his renal function is stable.  Plan: Recommend taking furosemide on weekdays Monday through Friday for the next 2 weeks and then if stable weights, return to 3 times a week.  Low threshold to increase back to 5 days a week for more edema.  Seems to be tolerating the increased dose of carvedilol.  Rate seems better.  Is on moderate dose losartan for afterload reduction.  With current blood pressure, would be reluctant to increase further.      Essential hypertension (Chronic)    Blood pressure stable.  Tolerating higher dose of carvedilol.  No change for now.      Hyperlipidemia with target LDL less than 100 (Chronic)    Labs been well controlled on current meds.  No plan to change from Zetia plus Livalo.  Last LDL was 81.  No change.      H/o Nonischemic CM -- EF ~50% on f/u studies (~ LBBB related) (Chronic)   Pulmonary emboli (HCC) (Chronic)  Mostly because of his underlying lung disease and lack of mobility, he is probably best continued on anticoagulation with Eliquis.  On reduced dose.       IPF (idiopathic pulmonary fibrosis) ([place])    This is probably the most major reason for his dyspnea and intermittent hypoxia.  We will simply work to try to keep him on the dry side.  Otherwise continue medications per pulmonary medicine.      Physical deconditioning   Goals of care, counseling/discussion    Very interesting that he was discharged on hospice care, but is still listed as full code.  Seems relatively contradictory.  I had discussion with the wife who I think is just having a hard time coming to grips with the concept of DNR.  I try to explain to her that DNR does not mean that we do not care for him.  It means that we do not do unnecessary heroic measures. I try to explain that given his lung disease, he probably would not survive a resuscitation attempt if it came to that.  She is still thinking.          COVID-19 Education: The signs and symptoms of COVID-19 were discussed with the patient and how to seek care for testing (follow up with PCP or arrange E-visit).   The importance of social distancing was discussed today.  I spent a total of 37minutes with the patient. >  50% of the time was spent in direct patient consultation.  Additional time spent with chart review  / charting (studies, outside notes, etc): 15 Total Time: 33 min   Current medicines are reviewed at length with the patient today.  (+/- concerns) n/a   Patient Instructions / Medication Changes & Studies & Tests Ordered   Patient Instructions  Medication Instructions:    for the next 2 weeks take Furosemide  Monday through Friday  , not the weekend. Then return previous directions *If you need a refill on your cardiac medications before your next appointment, please call your pharmacy*   Lab Work: Not needed   Testing/Procedures: Not needed   Follow-Up: At [facility], you and your health needs are our priority.  As part of our continuing mission to provide you with exceptional  heart care, we have created designated Provider Care Teams.  These Care Teams include your primary Cardiologist (physician) and Advanced Practice Providers (APPs -  Physician Assistants and Nurse Practitioners) who all work together to provide you with the care you need, when you need it.   Your next appointment:   6 month(s)  The format for your next appointment:   In Person  Provider:   Glenetta Hew, MD   Other Instructions n/a    Studies Ordered:   No orders of the defined types were placed in this encounter.    Glenetta Hew, M.D., M.S. Interventional Cardiologist   Pager # [phone] Phone # [phone] [address] [place], [place] [address]   Thank you for choosing Heartcare at [facility]!!

## 2019-10-23 NOTE — Patient Instructions (Signed)
Medication Instructions:    for the next 2 weeks take Furosemide  Monday through Friday  , not the weekend. Then return previous directions *If you need a refill on your cardiac medications before your next appointment, please call your pharmacy*   Lab Work: Not needed   Testing/Procedures: Not needed   Follow-Up: At [facility], you and your health needs are our priority.  As part of our continuing mission to provide you with exceptional heart care, we have created designated Provider Care Teams.  These Care Teams include your primary Cardiologist (physician) and Advanced Practice Providers (APPs -  Physician Assistants and Nurse Practitioners) who all work together to provide you with the care you need, when you need it.   Your next appointment:   6 month(s)  The format for your next appointment:   In Person  Provider:   Glenetta Hew, MD   Other Instructions n/a

## 2019-10-28 NOTE — Telephone Encounter (Signed)
Okay thank you.  If they want they can arrange for a video visit with nurse practitioner to make sure all bases are covered

## 2019-10-30 ENCOUNTER — Encounter: Payer: Self-pay | Admitting: Cardiology

## 2019-10-30 NOTE — Assessment & Plan Note (Signed)
This is probably the most major reason for his dyspnea and intermittent hypoxia.  We will simply work to try to keep him on the dry side.  Otherwise continue medications per pulmonary medicine.

## 2019-10-30 NOTE — Assessment & Plan Note (Signed)
Blood pressure stable.  Tolerating higher dose of carvedilol.  No change for now.

## 2019-10-30 NOTE — Assessment & Plan Note (Addendum)
Is really hard to tell how much a component of his dyspnea and breath sounds are related to heart versus his underlying lung disease.  Certainly he did benefit from being on the dry side.  Provided his renal function is stable.  Plan: Recommend taking furosemide on weekdays Monday through Friday for the next 2 weeks and then if stable weights, return to 3 times a week.  Low threshold to increase back to 5 days a week for more edema.  Seems to be tolerating the increased dose of carvedilol.  Rate seems better.  Is on moderate dose losartan for afterload reduction.  With current blood pressure, would be reluctant to increase further.

## 2019-10-30 NOTE — Assessment & Plan Note (Signed)
Very interesting that he was discharged on hospice care, but is still listed as full code.  Seems relatively contradictory.  I had discussion with the wife who I think is just having a hard time coming to grips with the concept of DNR.  I try to explain to her that DNR does not mean that we do not care for him.  It means that we do not do unnecessary heroic measures. I try to explain that given his lung disease, he probably would not survive a resuscitation attempt if it came to that.  She is still thinking.

## 2019-10-30 NOTE — Assessment & Plan Note (Signed)
Mostly because of his underlying lung disease and lack of mobility, he is probably best continued on anticoagulation with Eliquis.  On reduced dose.

## 2019-10-30 NOTE — Assessment & Plan Note (Signed)
Labs been well controlled on current meds.  No plan to change from Zetia plus Livalo.  Last LDL was 81.  No change.

## 2020-01-11 ENCOUNTER — Telehealth: Payer: Self-pay | Admitting: Internal Medicine

## 2020-01-11 NOTE — Telephone Encounter (Signed)
On [name] record newspaper it stated today patient had expired 2 days ago on 19-Jan-2020.  I called the patient's wife on his cell phone and left a message with condolence.  I then called the home line but it kept ringing without any answer  Plan -[name] please get a condolence card     SIGNATURE    Dr. Brand Males, M.D., F.C.C.P,  Pulmonary and Critical Care Medicine Staff Physician, [place] Director - Interstitial Lung Disease  Program  Pulmonary [place] at [place], [place], [address]  Pager: [phone], If no answer or between  15:00h - 7:00h: call [phone] Telephone: [phone]  4:51 PM 01/11/2020

## 2020-01-14 NOTE — Telephone Encounter (Signed)
Changed pt's status in chart to deceased. Have obtained a condolence card and have placed it in MR's mail slot in [place]. Nothing further needed.

## 2020-01-21 DEATH — deceased

## 2020-02-17 ENCOUNTER — Other Ambulatory Visit: Payer: Self-pay | Admitting: Pulmonary Disease

## 2020-02-17 ENCOUNTER — Other Ambulatory Visit: Payer: Self-pay | Admitting: Internal Medicine

## 2020-08-12 IMAGING — DX CHEST - 2 VIEW
2 series · 2 of 2 positions shown · non-contrast
Comparison: Chest radiographs 08/24/2018

CLINICAL DATA: Hemoptysis

EXAM:
CHEST - 2 VIEW

[chest ap]
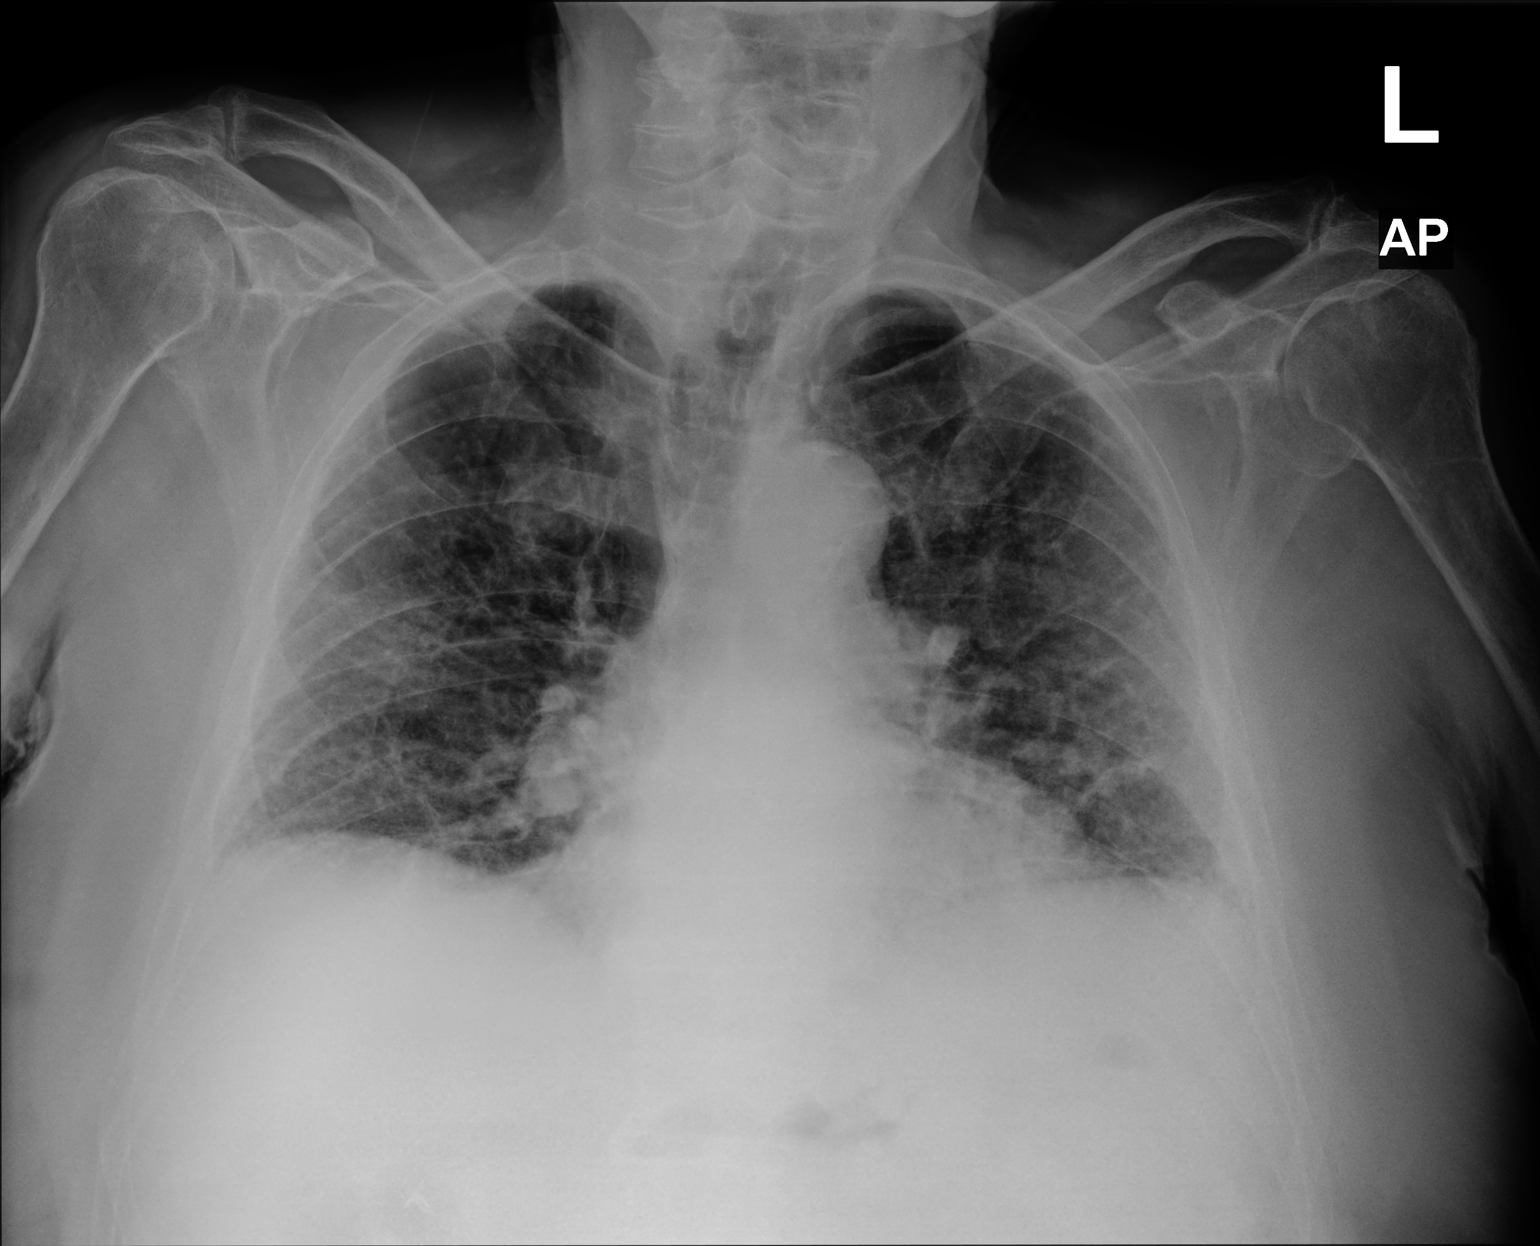

[chest lat]
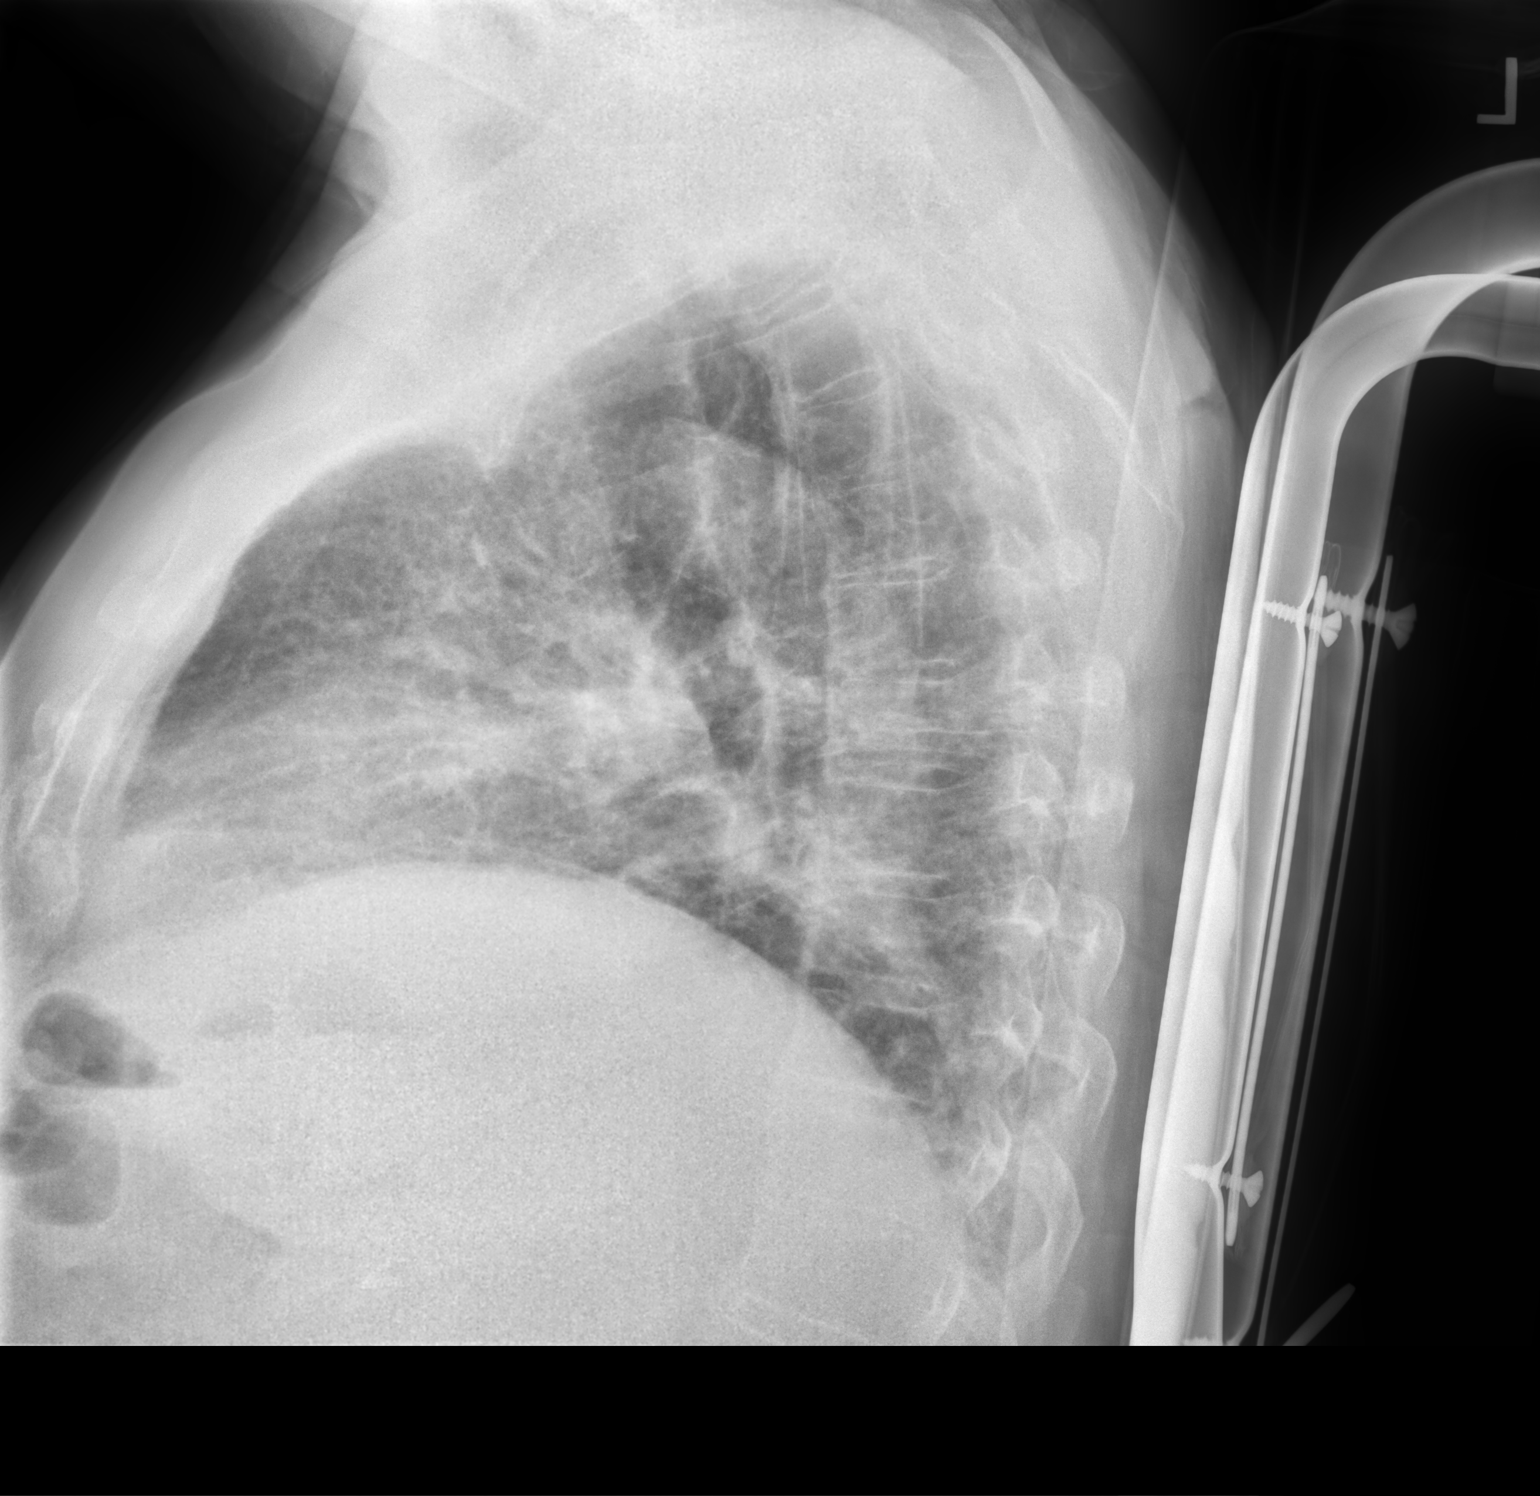

[2 of 2 positions shown; findings below may reference images not displayed]

FINDINGS: Low volume AP examination with increased bilateral interstitial
markings concerning for acutely superimposed infection or edema.
Underlying emphysematous change and fibrosis at the bases. Mild
cardiomegaly.
IMPRESSION: Low volume AP examination with increased bilateral interstitial
markings concerning for acutely superimposed infection or edema.
Underlying emphysematous change and fibrosis at the bases. Mild
cardiomegaly.
